# Patient Record
Sex: Female | Born: 1935
Health system: Southern US, Community
[De-identification: ages and names within clinical notes are randomized; demographics above are authoritative.]

## PROBLEM LIST (undated history)

## (undated) DIAGNOSIS — I5030 Unspecified diastolic (congestive) heart failure: Secondary | ICD-10-CM

## (undated) DIAGNOSIS — K219 Gastro-esophageal reflux disease without esophagitis: Secondary | ICD-10-CM

## (undated) DIAGNOSIS — E119 Type 2 diabetes mellitus without complications: Secondary | ICD-10-CM

## (undated) DIAGNOSIS — I10 Essential (primary) hypertension: Secondary | ICD-10-CM

## (undated) DIAGNOSIS — F32A Depression, unspecified: Secondary | ICD-10-CM

## (undated) DIAGNOSIS — M1990 Unspecified osteoarthritis, unspecified site: Secondary | ICD-10-CM

## (undated) DIAGNOSIS — E785 Hyperlipidemia, unspecified: Secondary | ICD-10-CM

## (undated) DIAGNOSIS — I739 Peripheral vascular disease, unspecified: Secondary | ICD-10-CM

## (undated) DIAGNOSIS — E079 Disorder of thyroid, unspecified: Secondary | ICD-10-CM

## (undated) DIAGNOSIS — F329 Major depressive disorder, single episode, unspecified: Secondary | ICD-10-CM

## (undated) HISTORY — DX: Hyperlipidemia, unspecified: E78.5

## (undated) HISTORY — DX: Unspecified diastolic (congestive) heart failure: I50.30

## (undated) HISTORY — DX: Major depressive disorder, single episode, unspecified: F32.9

## (undated) HISTORY — DX: Unspecified osteoarthritis, unspecified site: M19.90

## (undated) HISTORY — DX: Essential (primary) hypertension: I10

## (undated) HISTORY — DX: Disorder of thyroid, unspecified: E07.9

## (undated) HISTORY — DX: Gastro-esophageal reflux disease without esophagitis: K21.9

## (undated) HISTORY — DX: Depression, unspecified: F32.A

## (undated) HISTORY — DX: Type 2 diabetes mellitus without complications: E11.9

## (undated) HISTORY — DX: Peripheral vascular disease, unspecified: I73.9

---

## 2012-07-15 DIAGNOSIS — M47817 Spondylosis without myelopathy or radiculopathy, lumbosacral region: Secondary | ICD-10-CM | POA: Insufficient documentation

## 2012-07-15 DIAGNOSIS — J309 Allergic rhinitis, unspecified: Secondary | ICD-10-CM | POA: Insufficient documentation

## 2012-07-15 DIAGNOSIS — M1710 Unilateral primary osteoarthritis, unspecified knee: Secondary | ICD-10-CM | POA: Insufficient documentation

## 2012-07-15 DIAGNOSIS — M5137 Other intervertebral disc degeneration, lumbosacral region: Secondary | ICD-10-CM | POA: Insufficient documentation

## 2012-07-18 DIAGNOSIS — F329 Major depressive disorder, single episode, unspecified: Secondary | ICD-10-CM | POA: Insufficient documentation

## 2012-07-18 DIAGNOSIS — F339 Major depressive disorder, recurrent, unspecified: Secondary | ICD-10-CM | POA: Insufficient documentation

## 2012-07-18 DIAGNOSIS — F411 Generalized anxiety disorder: Secondary | ICD-10-CM | POA: Insufficient documentation

## 2012-08-04 DIAGNOSIS — M3500 Sicca syndrome, unspecified: Secondary | ICD-10-CM | POA: Insufficient documentation

## 2013-02-23 DIAGNOSIS — I10 Essential (primary) hypertension: Secondary | ICD-10-CM | POA: Insufficient documentation

## 2013-02-23 DIAGNOSIS — E782 Mixed hyperlipidemia: Secondary | ICD-10-CM | POA: Insufficient documentation

## 2013-02-23 DIAGNOSIS — E1169 Type 2 diabetes mellitus with other specified complication: Secondary | ICD-10-CM | POA: Insufficient documentation

## 2013-02-23 DIAGNOSIS — E7800 Pure hypercholesterolemia, unspecified: Secondary | ICD-10-CM | POA: Insufficient documentation

## 2013-09-04 DIAGNOSIS — G4700 Insomnia, unspecified: Secondary | ICD-10-CM | POA: Insufficient documentation

## 2014-01-21 DIAGNOSIS — E0590 Thyrotoxicosis, unspecified without thyrotoxic crisis or storm: Secondary | ICD-10-CM | POA: Insufficient documentation

## 2014-07-09 DIAGNOSIS — M4124 Other idiopathic scoliosis, thoracic region: Secondary | ICD-10-CM | POA: Insufficient documentation

## 2015-02-11 DIAGNOSIS — Z1231 Encounter for screening mammogram for malignant neoplasm of breast: Secondary | ICD-10-CM | POA: Diagnosis not present

## 2015-02-11 LAB — HM MAMMOGRAPHY

## 2015-02-27 DIAGNOSIS — E559 Vitamin D deficiency, unspecified: Secondary | ICD-10-CM | POA: Diagnosis not present

## 2015-02-27 DIAGNOSIS — E119 Type 2 diabetes mellitus without complications: Secondary | ICD-10-CM | POA: Diagnosis not present

## 2015-02-27 DIAGNOSIS — I10 Essential (primary) hypertension: Secondary | ICD-10-CM | POA: Diagnosis not present

## 2015-02-27 DIAGNOSIS — R809 Proteinuria, unspecified: Secondary | ICD-10-CM | POA: Diagnosis not present

## 2015-02-27 DIAGNOSIS — E876 Hypokalemia: Secondary | ICD-10-CM | POA: Diagnosis not present

## 2015-03-06 DIAGNOSIS — D696 Thrombocytopenia, unspecified: Secondary | ICD-10-CM | POA: Diagnosis not present

## 2015-03-06 DIAGNOSIS — E119 Type 2 diabetes mellitus without complications: Secondary | ICD-10-CM | POA: Diagnosis not present

## 2015-03-06 DIAGNOSIS — R809 Proteinuria, unspecified: Secondary | ICD-10-CM | POA: Diagnosis not present

## 2015-03-06 DIAGNOSIS — Z23 Encounter for immunization: Secondary | ICD-10-CM | POA: Diagnosis not present

## 2015-03-06 DIAGNOSIS — E7800 Pure hypercholesterolemia, unspecified: Secondary | ICD-10-CM | POA: Diagnosis not present

## 2015-03-06 DIAGNOSIS — I10 Essential (primary) hypertension: Secondary | ICD-10-CM | POA: Diagnosis not present

## 2015-03-06 DIAGNOSIS — E1140 Type 2 diabetes mellitus with diabetic neuropathy, unspecified: Secondary | ICD-10-CM | POA: Diagnosis not present

## 2015-03-06 DIAGNOSIS — M2010 Hallux valgus (acquired), unspecified foot: Secondary | ICD-10-CM | POA: Diagnosis not present

## 2015-03-06 DIAGNOSIS — L84 Corns and callosities: Secondary | ICD-10-CM | POA: Diagnosis not present

## 2015-03-11 DIAGNOSIS — H2513 Age-related nuclear cataract, bilateral: Secondary | ICD-10-CM | POA: Insufficient documentation

## 2015-06-23 DIAGNOSIS — I10 Essential (primary) hypertension: Secondary | ICD-10-CM | POA: Diagnosis not present

## 2015-06-23 DIAGNOSIS — D696 Thrombocytopenia, unspecified: Secondary | ICD-10-CM | POA: Diagnosis not present

## 2015-06-23 DIAGNOSIS — E119 Type 2 diabetes mellitus without complications: Secondary | ICD-10-CM | POA: Diagnosis not present

## 2015-06-23 DIAGNOSIS — E0590 Thyrotoxicosis, unspecified without thyrotoxic crisis or storm: Secondary | ICD-10-CM | POA: Diagnosis not present

## 2015-06-23 DIAGNOSIS — R0689 Other abnormalities of breathing: Secondary | ICD-10-CM | POA: Diagnosis not present

## 2015-06-23 DIAGNOSIS — Z79899 Other long term (current) drug therapy: Secondary | ICD-10-CM | POA: Diagnosis not present

## 2015-06-23 DIAGNOSIS — E876 Hypokalemia: Secondary | ICD-10-CM | POA: Diagnosis not present

## 2015-06-23 DIAGNOSIS — E559 Vitamin D deficiency, unspecified: Secondary | ICD-10-CM | POA: Diagnosis not present

## 2015-07-03 DIAGNOSIS — E7800 Pure hypercholesterolemia, unspecified: Secondary | ICD-10-CM | POA: Diagnosis not present

## 2015-07-03 DIAGNOSIS — R809 Proteinuria, unspecified: Secondary | ICD-10-CM | POA: Diagnosis not present

## 2015-07-17 DIAGNOSIS — M7072 Other bursitis of hip, left hip: Secondary | ICD-10-CM | POA: Diagnosis not present

## 2015-07-17 DIAGNOSIS — M25422 Effusion, left elbow: Secondary | ICD-10-CM | POA: Diagnosis not present

## 2015-07-17 DIAGNOSIS — S0003XA Contusion of scalp, initial encounter: Secondary | ICD-10-CM | POA: Diagnosis not present

## 2015-07-17 DIAGNOSIS — M7071 Other bursitis of hip, right hip: Secondary | ICD-10-CM | POA: Insufficient documentation

## 2015-07-17 DIAGNOSIS — R809 Proteinuria, unspecified: Secondary | ICD-10-CM | POA: Diagnosis not present

## 2015-07-17 DIAGNOSIS — Z9181 History of falling: Secondary | ICD-10-CM | POA: Diagnosis not present

## 2015-07-17 DIAGNOSIS — Z780 Asymptomatic menopausal state: Secondary | ICD-10-CM | POA: Diagnosis not present

## 2015-07-17 DIAGNOSIS — E7800 Pure hypercholesterolemia, unspecified: Secondary | ICD-10-CM | POA: Diagnosis not present

## 2015-07-17 DIAGNOSIS — S0090XA Unspecified superficial injury of unspecified part of head, initial encounter: Secondary | ICD-10-CM | POA: Diagnosis not present

## 2015-07-17 DIAGNOSIS — I10 Essential (primary) hypertension: Secondary | ICD-10-CM | POA: Diagnosis not present

## 2015-07-17 DIAGNOSIS — M25522 Pain in left elbow: Secondary | ICD-10-CM | POA: Diagnosis not present

## 2015-07-17 DIAGNOSIS — E1140 Type 2 diabetes mellitus with diabetic neuropathy, unspecified: Secondary | ICD-10-CM | POA: Diagnosis not present

## 2015-08-18 DIAGNOSIS — E119 Type 2 diabetes mellitus without complications: Secondary | ICD-10-CM | POA: Diagnosis not present

## 2015-08-18 DIAGNOSIS — H35371 Puckering of macula, right eye: Secondary | ICD-10-CM | POA: Diagnosis not present

## 2015-08-19 DIAGNOSIS — M8588 Other specified disorders of bone density and structure, other site: Secondary | ICD-10-CM | POA: Diagnosis not present

## 2015-08-19 DIAGNOSIS — Z780 Asymptomatic menopausal state: Secondary | ICD-10-CM | POA: Diagnosis not present

## 2015-08-21 DIAGNOSIS — M7071 Other bursitis of hip, right hip: Secondary | ICD-10-CM | POA: Diagnosis not present

## 2015-08-21 DIAGNOSIS — M7072 Other bursitis of hip, left hip: Secondary | ICD-10-CM | POA: Diagnosis not present

## 2015-08-23 DIAGNOSIS — M8580 Other specified disorders of bone density and structure, unspecified site: Secondary | ICD-10-CM | POA: Insufficient documentation

## 2015-08-23 DIAGNOSIS — M810 Age-related osteoporosis without current pathological fracture: Secondary | ICD-10-CM | POA: Insufficient documentation

## 2015-11-21 DIAGNOSIS — I10 Essential (primary) hypertension: Secondary | ICD-10-CM | POA: Diagnosis not present

## 2015-11-21 DIAGNOSIS — E7800 Pure hypercholesterolemia, unspecified: Secondary | ICD-10-CM | POA: Diagnosis not present

## 2015-11-21 DIAGNOSIS — Z23 Encounter for immunization: Secondary | ICD-10-CM | POA: Diagnosis not present

## 2015-11-21 DIAGNOSIS — E119 Type 2 diabetes mellitus without complications: Secondary | ICD-10-CM | POA: Diagnosis not present

## 2015-11-21 DIAGNOSIS — E876 Hypokalemia: Secondary | ICD-10-CM | POA: Diagnosis not present

## 2015-11-21 DIAGNOSIS — R809 Proteinuria, unspecified: Secondary | ICD-10-CM | POA: Diagnosis not present

## 2015-11-21 DIAGNOSIS — E0590 Thyrotoxicosis, unspecified without thyrotoxic crisis or storm: Secondary | ICD-10-CM | POA: Diagnosis not present

## 2015-11-21 DIAGNOSIS — E1140 Type 2 diabetes mellitus with diabetic neuropathy, unspecified: Secondary | ICD-10-CM | POA: Diagnosis not present

## 2015-11-21 DIAGNOSIS — E559 Vitamin D deficiency, unspecified: Secondary | ICD-10-CM | POA: Diagnosis not present

## 2015-12-01 DIAGNOSIS — E559 Vitamin D deficiency, unspecified: Secondary | ICD-10-CM | POA: Diagnosis not present

## 2015-12-01 DIAGNOSIS — I10 Essential (primary) hypertension: Secondary | ICD-10-CM | POA: Diagnosis not present

## 2015-12-01 DIAGNOSIS — E0590 Thyrotoxicosis, unspecified without thyrotoxic crisis or storm: Secondary | ICD-10-CM | POA: Diagnosis not present

## 2016-01-27 DIAGNOSIS — E7800 Pure hypercholesterolemia, unspecified: Secondary | ICD-10-CM | POA: Diagnosis not present

## 2016-01-27 DIAGNOSIS — M25552 Pain in left hip: Secondary | ICD-10-CM | POA: Diagnosis not present

## 2016-01-27 DIAGNOSIS — E1129 Type 2 diabetes mellitus with other diabetic kidney complication: Secondary | ICD-10-CM | POA: Diagnosis not present

## 2016-01-27 DIAGNOSIS — R809 Proteinuria, unspecified: Secondary | ICD-10-CM | POA: Diagnosis not present

## 2016-01-27 DIAGNOSIS — M25551 Pain in right hip: Secondary | ICD-10-CM | POA: Diagnosis not present

## 2016-01-27 DIAGNOSIS — Z1231 Encounter for screening mammogram for malignant neoplasm of breast: Secondary | ICD-10-CM | POA: Diagnosis not present

## 2016-01-27 DIAGNOSIS — I10 Essential (primary) hypertension: Secondary | ICD-10-CM | POA: Diagnosis not present

## 2016-01-27 DIAGNOSIS — E1140 Type 2 diabetes mellitus with diabetic neuropathy, unspecified: Secondary | ICD-10-CM | POA: Diagnosis not present

## 2016-02-09 DIAGNOSIS — I739 Peripheral vascular disease, unspecified: Secondary | ICD-10-CM

## 2016-02-09 HISTORY — DX: Peripheral vascular disease, unspecified: I73.9

## 2016-03-02 DIAGNOSIS — E0590 Thyrotoxicosis, unspecified without thyrotoxic crisis or storm: Secondary | ICD-10-CM | POA: Diagnosis not present

## 2016-03-02 DIAGNOSIS — Z79899 Other long term (current) drug therapy: Secondary | ICD-10-CM | POA: Diagnosis not present

## 2016-03-05 DIAGNOSIS — Z79899 Other long term (current) drug therapy: Secondary | ICD-10-CM | POA: Diagnosis not present

## 2016-03-05 DIAGNOSIS — F4320 Adjustment disorder, unspecified: Secondary | ICD-10-CM | POA: Diagnosis not present

## 2016-03-05 DIAGNOSIS — I10 Essential (primary) hypertension: Secondary | ICD-10-CM | POA: Diagnosis not present

## 2016-03-05 DIAGNOSIS — Z634 Disappearance and death of family member: Secondary | ICD-10-CM | POA: Insufficient documentation

## 2016-03-05 DIAGNOSIS — E559 Vitamin D deficiency, unspecified: Secondary | ICD-10-CM | POA: Diagnosis not present

## 2016-03-05 DIAGNOSIS — E0590 Thyrotoxicosis, unspecified without thyrotoxic crisis or storm: Secondary | ICD-10-CM | POA: Diagnosis not present

## 2016-03-15 DIAGNOSIS — Z1231 Encounter for screening mammogram for malignant neoplasm of breast: Secondary | ICD-10-CM | POA: Diagnosis not present

## 2016-06-03 DIAGNOSIS — I10 Essential (primary) hypertension: Secondary | ICD-10-CM | POA: Diagnosis not present

## 2016-06-03 DIAGNOSIS — E1140 Type 2 diabetes mellitus with diabetic neuropathy, unspecified: Secondary | ICD-10-CM | POA: Diagnosis not present

## 2016-06-03 DIAGNOSIS — E7800 Pure hypercholesterolemia, unspecified: Secondary | ICD-10-CM | POA: Diagnosis not present

## 2016-06-03 DIAGNOSIS — E876 Hypokalemia: Secondary | ICD-10-CM | POA: Diagnosis not present

## 2016-06-03 DIAGNOSIS — E559 Vitamin D deficiency, unspecified: Secondary | ICD-10-CM | POA: Diagnosis not present

## 2016-06-03 DIAGNOSIS — R809 Proteinuria, unspecified: Secondary | ICD-10-CM | POA: Diagnosis not present

## 2016-06-03 DIAGNOSIS — E1129 Type 2 diabetes mellitus with other diabetic kidney complication: Secondary | ICD-10-CM | POA: Diagnosis not present

## 2016-06-18 DIAGNOSIS — E559 Vitamin D deficiency, unspecified: Secondary | ICD-10-CM | POA: Diagnosis not present

## 2016-06-18 DIAGNOSIS — M25552 Pain in left hip: Secondary | ICD-10-CM | POA: Diagnosis not present

## 2016-06-18 DIAGNOSIS — D72819 Decreased white blood cell count, unspecified: Secondary | ICD-10-CM | POA: Diagnosis not present

## 2016-06-18 DIAGNOSIS — E7800 Pure hypercholesterolemia, unspecified: Secondary | ICD-10-CM | POA: Diagnosis not present

## 2016-06-18 DIAGNOSIS — M25551 Pain in right hip: Secondary | ICD-10-CM | POA: Diagnosis not present

## 2016-06-18 DIAGNOSIS — R809 Proteinuria, unspecified: Secondary | ICD-10-CM | POA: Diagnosis not present

## 2016-06-18 DIAGNOSIS — E119 Type 2 diabetes mellitus without complications: Secondary | ICD-10-CM | POA: Diagnosis not present

## 2016-06-18 DIAGNOSIS — E669 Obesity, unspecified: Secondary | ICD-10-CM | POA: Diagnosis not present

## 2016-06-18 DIAGNOSIS — I10 Essential (primary) hypertension: Secondary | ICD-10-CM | POA: Diagnosis not present

## 2016-06-18 DIAGNOSIS — Z23 Encounter for immunization: Secondary | ICD-10-CM | POA: Diagnosis not present

## 2016-06-24 DIAGNOSIS — E0590 Thyrotoxicosis, unspecified without thyrotoxic crisis or storm: Secondary | ICD-10-CM | POA: Diagnosis not present

## 2016-06-24 DIAGNOSIS — Z79899 Other long term (current) drug therapy: Secondary | ICD-10-CM | POA: Diagnosis not present

## 2016-06-28 DIAGNOSIS — E0590 Thyrotoxicosis, unspecified without thyrotoxic crisis or storm: Secondary | ICD-10-CM | POA: Diagnosis not present

## 2016-06-28 DIAGNOSIS — I10 Essential (primary) hypertension: Secondary | ICD-10-CM | POA: Diagnosis not present

## 2016-06-28 DIAGNOSIS — F4320 Adjustment disorder, unspecified: Secondary | ICD-10-CM | POA: Diagnosis not present

## 2016-06-28 DIAGNOSIS — Z634 Disappearance and death of family member: Secondary | ICD-10-CM | POA: Diagnosis not present

## 2016-06-28 DIAGNOSIS — E559 Vitamin D deficiency, unspecified: Secondary | ICD-10-CM | POA: Diagnosis not present

## 2016-09-14 DIAGNOSIS — E0590 Thyrotoxicosis, unspecified without thyrotoxic crisis or storm: Secondary | ICD-10-CM | POA: Diagnosis not present

## 2016-09-14 DIAGNOSIS — E559 Vitamin D deficiency, unspecified: Secondary | ICD-10-CM | POA: Diagnosis not present

## 2016-09-14 DIAGNOSIS — I10 Essential (primary) hypertension: Secondary | ICD-10-CM | POA: Diagnosis not present

## 2016-10-15 DIAGNOSIS — I10 Essential (primary) hypertension: Secondary | ICD-10-CM | POA: Diagnosis not present

## 2016-10-19 DIAGNOSIS — H25013 Cortical age-related cataract, bilateral: Secondary | ICD-10-CM | POA: Diagnosis not present

## 2016-10-19 DIAGNOSIS — E119 Type 2 diabetes mellitus without complications: Secondary | ICD-10-CM | POA: Diagnosis not present

## 2016-10-19 DIAGNOSIS — H25042 Posterior subcapsular polar age-related cataract, left eye: Secondary | ICD-10-CM | POA: Diagnosis not present

## 2016-10-19 DIAGNOSIS — H35371 Puckering of macula, right eye: Secondary | ICD-10-CM | POA: Diagnosis not present

## 2016-10-19 DIAGNOSIS — H43813 Vitreous degeneration, bilateral: Secondary | ICD-10-CM | POA: Diagnosis not present

## 2016-10-19 DIAGNOSIS — H524 Presbyopia: Secondary | ICD-10-CM | POA: Diagnosis not present

## 2016-10-19 DIAGNOSIS — H52203 Unspecified astigmatism, bilateral: Secondary | ICD-10-CM | POA: Diagnosis not present

## 2016-10-19 DIAGNOSIS — H2513 Age-related nuclear cataract, bilateral: Secondary | ICD-10-CM | POA: Diagnosis not present

## 2016-10-19 DIAGNOSIS — H5213 Myopia, bilateral: Secondary | ICD-10-CM | POA: Diagnosis not present

## 2016-10-19 DIAGNOSIS — H43393 Other vitreous opacities, bilateral: Secondary | ICD-10-CM | POA: Diagnosis not present

## 2016-10-19 DIAGNOSIS — H04123 Dry eye syndrome of bilateral lacrimal glands: Secondary | ICD-10-CM | POA: Diagnosis not present

## 2016-11-28 DIAGNOSIS — I739 Peripheral vascular disease, unspecified: Secondary | ICD-10-CM | POA: Diagnosis not present

## 2016-11-28 DIAGNOSIS — I519 Heart disease, unspecified: Secondary | ICD-10-CM | POA: Diagnosis not present

## 2016-11-28 DIAGNOSIS — I517 Cardiomegaly: Secondary | ICD-10-CM | POA: Diagnosis not present

## 2016-11-28 DIAGNOSIS — I10 Essential (primary) hypertension: Secondary | ICD-10-CM | POA: Diagnosis not present

## 2016-11-28 DIAGNOSIS — M5136 Other intervertebral disc degeneration, lumbar region: Secondary | ICD-10-CM | POA: Diagnosis not present

## 2016-11-28 DIAGNOSIS — I083 Combined rheumatic disorders of mitral, aortic and tricuspid valves: Secondary | ICD-10-CM | POA: Diagnosis not present

## 2016-11-28 DIAGNOSIS — E785 Hyperlipidemia, unspecified: Secondary | ICD-10-CM | POA: Diagnosis not present

## 2016-11-28 DIAGNOSIS — I743 Embolism and thrombosis of arteries of the lower extremities: Secondary | ICD-10-CM | POA: Diagnosis present

## 2016-11-28 DIAGNOSIS — F446 Conversion disorder with sensory symptom or deficit: Secondary | ICD-10-CM | POA: Diagnosis not present

## 2016-11-28 DIAGNOSIS — R202 Paresthesia of skin: Secondary | ICD-10-CM | POA: Diagnosis not present

## 2016-11-28 DIAGNOSIS — Z881 Allergy status to other antibiotic agents status: Secondary | ICD-10-CM | POA: Diagnosis not present

## 2016-11-28 DIAGNOSIS — R531 Weakness: Secondary | ICD-10-CM | POA: Diagnosis not present

## 2016-11-28 DIAGNOSIS — D259 Leiomyoma of uterus, unspecified: Secondary | ICD-10-CM | POA: Diagnosis not present

## 2016-11-28 DIAGNOSIS — Z79899 Other long term (current) drug therapy: Secondary | ICD-10-CM | POA: Diagnosis not present

## 2016-11-28 DIAGNOSIS — Z7982 Long term (current) use of aspirin: Secondary | ICD-10-CM | POA: Diagnosis not present

## 2016-11-28 DIAGNOSIS — I998 Other disorder of circulatory system: Secondary | ICD-10-CM | POA: Diagnosis present

## 2016-11-28 DIAGNOSIS — M79604 Pain in right leg: Secondary | ICD-10-CM | POA: Diagnosis not present

## 2016-11-28 DIAGNOSIS — R200 Anesthesia of skin: Secondary | ICD-10-CM | POA: Diagnosis not present

## 2016-11-28 DIAGNOSIS — K219 Gastro-esophageal reflux disease without esophagitis: Secondary | ICD-10-CM | POA: Diagnosis present

## 2016-11-28 DIAGNOSIS — I70201 Unspecified atherosclerosis of native arteries of extremities, right leg: Secondary | ICD-10-CM | POA: Diagnosis not present

## 2016-11-29 HISTORY — PX: FEMORAL ENDARTERECTOMY: SUR606

## 2016-12-04 DIAGNOSIS — I70221 Atherosclerosis of native arteries of extremities with rest pain, right leg: Secondary | ICD-10-CM | POA: Diagnosis not present

## 2016-12-04 DIAGNOSIS — I10 Essential (primary) hypertension: Secondary | ICD-10-CM | POA: Diagnosis not present

## 2016-12-04 DIAGNOSIS — K219 Gastro-esophageal reflux disease without esophagitis: Secondary | ICD-10-CM | POA: Diagnosis not present

## 2016-12-04 DIAGNOSIS — E785 Hyperlipidemia, unspecified: Secondary | ICD-10-CM | POA: Diagnosis not present

## 2016-12-04 DIAGNOSIS — R2689 Other abnormalities of gait and mobility: Secondary | ICD-10-CM | POA: Diagnosis not present

## 2016-12-04 DIAGNOSIS — I743 Embolism and thrombosis of arteries of the lower extremities: Secondary | ICD-10-CM | POA: Diagnosis not present

## 2016-12-10 DIAGNOSIS — I743 Embolism and thrombosis of arteries of the lower extremities: Secondary | ICD-10-CM | POA: Diagnosis not present

## 2016-12-10 DIAGNOSIS — K219 Gastro-esophageal reflux disease without esophagitis: Secondary | ICD-10-CM | POA: Diagnosis not present

## 2016-12-10 DIAGNOSIS — I70221 Atherosclerosis of native arteries of extremities with rest pain, right leg: Secondary | ICD-10-CM | POA: Diagnosis not present

## 2016-12-10 DIAGNOSIS — I10 Essential (primary) hypertension: Secondary | ICD-10-CM | POA: Diagnosis not present

## 2016-12-10 DIAGNOSIS — R2689 Other abnormalities of gait and mobility: Secondary | ICD-10-CM | POA: Diagnosis not present

## 2016-12-10 DIAGNOSIS — E785 Hyperlipidemia, unspecified: Secondary | ICD-10-CM | POA: Diagnosis not present

## 2016-12-15 DIAGNOSIS — I743 Embolism and thrombosis of arteries of the lower extremities: Secondary | ICD-10-CM | POA: Diagnosis not present

## 2016-12-15 DIAGNOSIS — I70221 Atherosclerosis of native arteries of extremities with rest pain, right leg: Secondary | ICD-10-CM | POA: Diagnosis not present

## 2016-12-15 DIAGNOSIS — E785 Hyperlipidemia, unspecified: Secondary | ICD-10-CM | POA: Diagnosis not present

## 2016-12-15 DIAGNOSIS — R2689 Other abnormalities of gait and mobility: Secondary | ICD-10-CM | POA: Diagnosis not present

## 2016-12-15 DIAGNOSIS — I10 Essential (primary) hypertension: Secondary | ICD-10-CM | POA: Diagnosis not present

## 2016-12-15 DIAGNOSIS — K219 Gastro-esophageal reflux disease without esophagitis: Secondary | ICD-10-CM | POA: Diagnosis not present

## 2016-12-24 DIAGNOSIS — I70221 Atherosclerosis of native arteries of extremities with rest pain, right leg: Secondary | ICD-10-CM | POA: Diagnosis not present

## 2016-12-24 DIAGNOSIS — E785 Hyperlipidemia, unspecified: Secondary | ICD-10-CM | POA: Diagnosis not present

## 2016-12-24 DIAGNOSIS — I10 Essential (primary) hypertension: Secondary | ICD-10-CM | POA: Diagnosis not present

## 2016-12-24 DIAGNOSIS — K219 Gastro-esophageal reflux disease without esophagitis: Secondary | ICD-10-CM | POA: Diagnosis not present

## 2016-12-24 DIAGNOSIS — I743 Embolism and thrombosis of arteries of the lower extremities: Secondary | ICD-10-CM | POA: Diagnosis not present

## 2016-12-24 DIAGNOSIS — R2689 Other abnormalities of gait and mobility: Secondary | ICD-10-CM | POA: Diagnosis not present

## 2016-12-29 DIAGNOSIS — R739 Hyperglycemia, unspecified: Secondary | ICD-10-CM | POA: Diagnosis not present

## 2016-12-29 DIAGNOSIS — E119 Type 2 diabetes mellitus without complications: Secondary | ICD-10-CM | POA: Diagnosis not present

## 2016-12-29 DIAGNOSIS — F329 Major depressive disorder, single episode, unspecified: Secondary | ICD-10-CM | POA: Diagnosis not present

## 2016-12-29 DIAGNOSIS — E0590 Thyrotoxicosis, unspecified without thyrotoxic crisis or storm: Secondary | ICD-10-CM | POA: Diagnosis not present

## 2016-12-29 DIAGNOSIS — I743 Embolism and thrombosis of arteries of the lower extremities: Secondary | ICD-10-CM | POA: Diagnosis not present

## 2016-12-29 DIAGNOSIS — E559 Vitamin D deficiency, unspecified: Secondary | ICD-10-CM | POA: Diagnosis not present

## 2016-12-29 DIAGNOSIS — I10 Essential (primary) hypertension: Secondary | ICD-10-CM | POA: Diagnosis not present

## 2017-01-04 DIAGNOSIS — I743 Embolism and thrombosis of arteries of the lower extremities: Secondary | ICD-10-CM | POA: Diagnosis not present

## 2017-01-04 DIAGNOSIS — I70221 Atherosclerosis of native arteries of extremities with rest pain, right leg: Secondary | ICD-10-CM | POA: Diagnosis not present

## 2017-01-04 DIAGNOSIS — E785 Hyperlipidemia, unspecified: Secondary | ICD-10-CM | POA: Diagnosis not present

## 2017-01-04 DIAGNOSIS — K219 Gastro-esophageal reflux disease without esophagitis: Secondary | ICD-10-CM | POA: Diagnosis not present

## 2017-01-04 DIAGNOSIS — I10 Essential (primary) hypertension: Secondary | ICD-10-CM | POA: Diagnosis not present

## 2017-01-04 DIAGNOSIS — R2689 Other abnormalities of gait and mobility: Secondary | ICD-10-CM | POA: Diagnosis not present

## 2017-01-05 DIAGNOSIS — Z48812 Encounter for surgical aftercare following surgery on the circulatory system: Secondary | ICD-10-CM | POA: Diagnosis not present

## 2017-01-05 DIAGNOSIS — I739 Peripheral vascular disease, unspecified: Secondary | ICD-10-CM | POA: Diagnosis not present

## 2017-01-20 ENCOUNTER — Encounter: Payer: Self-pay | Admitting: *Deleted

## 2017-01-20 ENCOUNTER — Other Ambulatory Visit: Payer: Self-pay | Admitting: *Deleted

## 2017-01-24 ENCOUNTER — Ambulatory Visit (INDEPENDENT_AMBULATORY_CARE_PROVIDER_SITE_OTHER): Payer: Medicare Other | Admitting: Family Medicine

## 2017-01-24 ENCOUNTER — Encounter: Payer: Self-pay | Admitting: Family Medicine

## 2017-01-24 VITALS — BP 140/66 | HR 52 | Temp 98.2°F | Ht 61.75 in | Wt 165.0 lb

## 2017-01-24 DIAGNOSIS — F341 Dysthymic disorder: Secondary | ICD-10-CM | POA: Diagnosis not present

## 2017-01-24 DIAGNOSIS — E119 Type 2 diabetes mellitus without complications: Secondary | ICD-10-CM | POA: Diagnosis not present

## 2017-01-24 DIAGNOSIS — R600 Localized edema: Secondary | ICD-10-CM | POA: Diagnosis not present

## 2017-01-24 DIAGNOSIS — I10 Essential (primary) hypertension: Secondary | ICD-10-CM | POA: Diagnosis not present

## 2017-01-24 DIAGNOSIS — D649 Anemia, unspecified: Secondary | ICD-10-CM

## 2017-01-24 DIAGNOSIS — M19011 Primary osteoarthritis, right shoulder: Secondary | ICD-10-CM | POA: Diagnosis not present

## 2017-01-24 DIAGNOSIS — I743 Embolism and thrombosis of arteries of the lower extremities: Secondary | ICD-10-CM

## 2017-01-24 DIAGNOSIS — E0590 Thyrotoxicosis, unspecified without thyrotoxic crisis or storm: Secondary | ICD-10-CM | POA: Diagnosis not present

## 2017-01-24 DIAGNOSIS — E1142 Type 2 diabetes mellitus with diabetic polyneuropathy: Secondary | ICD-10-CM | POA: Insufficient documentation

## 2017-01-24 DIAGNOSIS — Z7901 Long term (current) use of anticoagulants: Secondary | ICD-10-CM | POA: Diagnosis not present

## 2017-01-24 DIAGNOSIS — F329 Major depressive disorder, single episode, unspecified: Secondary | ICD-10-CM

## 2017-01-24 DIAGNOSIS — M51379 Other intervertebral disc degeneration, lumbosacral region without mention of lumbar back pain or lower extremity pain: Secondary | ICD-10-CM

## 2017-01-24 DIAGNOSIS — M5137 Other intervertebral disc degeneration, lumbosacral region: Secondary | ICD-10-CM | POA: Diagnosis not present

## 2017-01-24 NOTE — Progress Notes (Signed)
Subjective  CC:  Chief Complaint  Patient presents with  . Establish Care    HPI: Nicole Jimenez is a 82 y.o. female who presents to [place] at [facility] today to establish care with me as a new patient.  She needs a new primary care doctor because her former doctor, Dr. Dot Been, left her practice. She has the following concerns or needs:   Very pleasant [place] 82 year old female who lives independently with past medical history significant for recent right femoral artery embolectomy due to thromboembolism, no clear source identified after 2D echocardiogram done and abdominal pelvic CT scan.  I reviewed all of those records.  She recently followed up with vascular surgeon and is doing well.  He has her on aspirin and Eliquis.  Will reevaluate in 6 months to see if she can stop this.  She does complain of bilateral lower extremity swelling that started prior to this.  He ordered compression stockings although she is not wearing them.  She reports that she has had swelling in the past, was on hydrochlorothiazide but her blood pressure got low and so that was stopped.  She has no calf pain.  No shortness of breath.  Energy level is good.  She has diet-controlled diabetes and subclinical hyperthyroidism managed by endocrinology.  I reviewed those recent notes.  Hypertension has been well controlled he has has hypercholesterolemia  She does suffer from osteoarthritic pain, mainly in her back and right shoulder.  This does limit her activity somewhat.  However she stays active.  Her most recent bone density was in 2017 showed mild osteopenia.  We updated and reviewed the patient's past history in detail and it is documented below.  Patient Active Problem List   Diagnosis Date Noted  . Diet-controlled diabetes mellitus ([place]) 01/24/2017    Priority: High  . Essential hypertension 02/23/2013    Priority: High  . Hypercholesterolemia 02/23/2013    Priority: High    . DJD of right shoulder 01/24/2017    Priority: Medium  . Bereavement due to life event 03/05/2016    Priority: Medium  . Osteopenia 08/23/2015    Priority: Medium  . Idiopathic scoliosis of thoracic spine 07/09/2014    Priority: Medium  . Subclinical hyperthyroidism 01/21/2014    Priority: Medium  . DDD (degenerative disc disease), lumbosacral 07/15/2012    Priority: Medium  . Lumbosacral spondylosis 07/15/2012    Priority: Medium  . Osteoarthritis of knee 07/15/2012    Priority: Medium  . Bilateral hip bursitis 07/17/2015    Priority: Low  . Age-related nuclear cataract of both eyes 03/11/2015    Priority: Low  . Insomnia 09/04/2013    Priority: Low  . Embolus of femoral artery ([place]) 01/26/2017  . Bilateral edema of lower extremity 01/26/2017  . Sjogren's syndrome ([place]) 08/04/2012    Health Maintenance  Topic Date Due  . TETANUS/TDAP  01/24/2018 (Originally 12/14/1953)  . URINE MICROALBUMIN  05/26/2017  . FOOT EXAM  06/18/2017  . HEMOGLOBIN A1C  07/25/2017  . OPHTHALMOLOGY EXAM  10/19/2017  . INFLUENZA VACCINE  Completed  . DEXA SCAN  Completed  . PNA vac Low Risk Adult  Completed   Immunization History  Administered Date(s) Administered  . Influenza Split 01/24/2012  . Influenza, High Dose Seasonal PF 12/02/2013, 01/20/2015, 12/27/2016  . Influenza,inj,Quad PF,6+ Mos 11/21/2015  . Influenza-Unspecified 01/24/2012, 01/05/2014, 01/05/2014, 01/20/2015  . Pneumococcal Conjugate-13 03/06/2015, 03/06/2015  . Pneumococcal Polysaccharide-23 06/18/2016  . Zoster 10/09/2012, 10/09/2012   Current  Meds  Medication Sig  . Acetaminophen 500 MG coapsule Take 1,000 mg by mouth as needed.   . ACIDOPHILUS LACTOBACILLUS PO Take 1 each by mouth daily.   Marland Kitchen aspirin 81 MG tablet Take 81 mg by mouth.  Marland Kitchen atenolol (TENORMIN) 50 MG tablet TAKE 1 TABLET BY MOUTH TWICE A DAY FOR BLOOD PRESSURE  . Calcium Carb-Cholecalciferol 600-800 MG-UNIT CHEW Chew 1 each by mouth daily.   .  citalopram (CELEXA) 20 MG tablet TAKE 1 TABLET BY MOUTH EVERY DAY  . diltiazem (CARDIZEM CD) 180 MG 24 hr capsule Take 180 mg by mouth every morning.  . Glucosamine-MSM-Hyaluronic Acd (JOINT HEALTH PO) Take 1 tablet by mouth daily.  Marland Kitchen glucose blood (ONE TOUCH ULTRA TEST) test strip USE TWICE DAILY TO CHECK BLOOD SUGAR - E11.40  . omeprazole (PRILOSEC) 20 MG capsule TAKE ONE CAPSULE BY MOUTH EVERY MORNING 30 MINUTES BEFORE BREAKFAST.  Marland Kitchen pravastatin (PRAVACHOL) 80 MG tablet Take 80 mg by mouth daily.  . [DISCONTINUED] acetaminophen (TYLENOL) 500 MG tablet Frequency:   Dosage:0   MG  Instructions:  Note:2 tabs po PRN  . [DISCONTINUED] DOCOSAHEXAENOIC ACID PO Frequency:   Dosage:0   MG  Instructions:  Note:353 mg 1 tab by mouth daily    Allergies: Patient is allergic to amoxicillin-pot clavulanate; amoxicillin; and clarithromycin. Past Medical History Patient  has a past medical history of Arthritis, Depression, Diabetes mellitus without complication ([place]), GERD (gastroesophageal reflux disease), Hyperlipidemia, Hypertension, Peripheral arterial disease ([place]) (2018), and Thyroid disease. Past Surgical History Patient  has a past surgical history that includes Femoral endarterectomy (Right, 11/29/2016). Family History: Patient family history includes Arthritis in her mother and sister; Diabetes in her father and sister; Healthy in her daughter, son, and son; Heart attack in her father and mother; Hyperlipidemia in her father. Social History:  Patient  reports that  has never smoked. she has never used smokeless tobacco. She reports that she does not drink alcohol or use drugs.  Review of Systems: Constitutional: negative for fever or malaise Ophthalmic: negative for photophobia, double vision or loss of vision Cardiovascular: negative for chest pain, dyspnea on exertion, or new LE swelling Respiratory: negative for SOB or persistent cough Gastrointestinal: negative for abdominal pain, change in  bowel habits or melena Genitourinary: negative for dysuria or gross hematuria Musculoskeletal: negative for new gait disturbance or muscular weakness Integumentary: negative for new or persistent rashes Neurological: negative for TIA or stroke symptoms Psychiatric: negative for SI or delusions Allergic/Immunologic: negative for hives  Patient Care Team    Relationship Specialty Notifications Start End  Leamon Arnt, MD PCP - General Family Medicine  01/24/17   Roel Cluck, MD Referring Physician Ophthalmology  01/24/17   Alanson Aly, MD Consulting Physician Endocrinology  01/24/17   Dinah Beers, MD Referring Physician Vascular Surgery  01/24/17     Objective  Vitals: BP 140/66 (BP Location: Left Arm, Patient Position: Sitting, Cuff Size: Normal)   Pulse (!) 52   Temp 98.2 F (36.8 C) (Oral)   Ht 5' 1.75" (1.568 m)   Wt 165 lb (74.8 kg)   SpO2 96%   BMI 30.42 kg/m  General:  Well developed, well nourished, no acute distress  Psych:  Alert and oriented,normal mood and affect HEENT:  Normocephalic, atraumatic, non-icteric sclera, PERRL, oropharynx is without mass or exudate, supple neck without adenopathy, mass or thyromegaly Cardiovascular:  RRR without gallop, rub or murmur, nondisplaced PMI, there is 2+ pitting edema bilateral ankles, distal pulses are +  2 bilaterally Respiratory:  Good breath sounds bilaterally, CTAB with normal respiratory effort Gastrointestinal: normal bowel sounds, soft, non-tender, no noted masses. No HSM MSK: no deformities, contusions. Joints are without erythema or swelling no cords Skin:  Warm, no rashes or suspicious lesions noted Neurologic:    Mental status is normal. Gross motor and sensory exams are normal. Normal gait  Assessment  1. Essential hypertension   2. Diet-controlled diabetes mellitus ([place])   3. Primary osteoarthritis of right shoulder   4. Subclinical hyperthyroidism   5. DDD (degenerative disc disease), lumbosacral    6. Major depression, chronic   7. Anticoagulated   8. Bilateral edema of lower extremity   9. Postoperative anemia   10. Embolus of femoral artery ([place])      Plan   We discussed the above chronic medical conditions that are mostly well controlled.  I will reassess diabetic control and monitor her electrolytes and renal function.  We will recheck her lipids.  I will adjust medications as appropriate.  Her blood pressure is mildly elevated today.  Pending lab work, I will consider adding HCTZ, low-dose or adjusting her blood pressure medication so that we can treat her lower extremity edema that is likely just dependent in nature.  We will go over all the results and adjust her medications at her follow-up visit in 2-3 weeks.  Follow up:  No Follow-up on file.  Commons side effects, risks, benefits, and alternatives for medications and treatment plan prescribed today were discussed, and the patient expressed understanding of the given instructions. Patient is instructed to call or message via MyChart if he/she has any questions or concerns regarding our treatment plan. No barriers to understanding were identified. We discussed Red Flag symptoms and signs in detail. Patient expressed understanding regarding what to do in case of urgent or emergency type symptoms.   Medication list was reconciled, printed and provided to the patient in AVS. Patient instructions and summary information was reviewed with the patient as documented in the AVS. This note was prepared with assistance of Dragon voice recognition software. Occasional wrong-word or sound-a-like substitutions may have occurred due to the inherent limitations of voice recognition software  Orders Placed This Encounter  Procedures  . CBC with Differential/Platelet  . Comp Met (CMET)  . HgB A1c  . Lipid panel   No orders of the defined types were placed in this encounter.

## 2017-01-24 NOTE — Patient Instructions (Signed)
Please return in 2-3 weeks for follow up office visit to go over your results and medication recommendations.  It was a pleasure meeting you! Thank you for choosing [place] to meet your healthcare needs! I truly look forward to working with you.   Edema Edema is when you have too much fluid in your body or under your skin. Edema may make your legs, feet, and ankles swell up. Swelling is also common in looser tissues, like around your eyes. This is a common condition. It gets more common as you get older. There are many possible causes of edema. Eating too much salt (sodium) and being on your feet or sitting for a long time can cause edema in your legs, feet, and ankles. Hot weather may make edema worse. Edema is usually painless. Your skin may look swollen or shiny. Follow these instructions at home:  Keep the swollen body part raised (elevated) above the level of your heart when you are sitting or lying down.  Do not sit still or stand for a long time.  Do not wear tight clothes. Do not wear garters on your upper legs.  Exercise your legs. This can help the swelling go down.  Wear elastic bandages or support stockings as told by your doctor.  Eat a low-salt (low-sodium) diet to reduce fluid as told by your doctor.  Depending on the cause of your swelling, you may need to limit how much fluid you drink (fluid restriction).  Take over-the-counter and prescription medicines only as told by your doctor. Contact a doctor if:  Treatment is not working.  You have heart, liver, or kidney disease and have symptoms of edema.  You have sudden and unexplained weight gain. Get help right away if:  You have shortness of breath or chest pain.  You cannot breathe when you lie down.  You have pain, redness, or warmth in the swollen areas.  You have heart, liver, or kidney disease and get edema all of a sudden.  You have a fever and your symptoms get worse all of a sudden. Summary  Edema is  when you have too much fluid in your body or under your skin.  Edema may make your legs, feet, and ankles swell up. Swelling is also common in looser tissues, like around your eyes.  Raise (elevate) the swollen body part above the level of your heart when you are sitting or lying down.  Follow your doctor's instructions about diet and how much fluid you can drink (fluid restriction). This information is not intended to replace advice given to you by your health care provider. Make sure you discuss any questions you have with your health care provider. Document Released: 07/14/2007 Document Revised: 02/13/2016 Document Reviewed: 02/13/2016 Elsevier Interactive Patient Education  2017 [employer].

## 2017-01-25 LAB — COMPREHENSIVE METABOLIC PANEL
AG Ratio: 1.1 (calc) (ref 1.0–2.5)
ALBUMIN MSPROF: 4.1 g/dL (ref 3.6–5.1)
ALKALINE PHOSPHATASE (APISO): 78 U/L (ref 33–130)
ALT: 14 U/L (ref 6–29)
AST: 18 U/L (ref 10–35)
BUN/Creatinine Ratio: 23 (calc) — ABNORMAL HIGH (ref 6–22)
BUN: 22 mg/dL (ref 7–25)
CHLORIDE: 101 mmol/L (ref 98–110)
CO2: 32 mmol/L (ref 20–32)
CREATININE: 0.95 mg/dL — AB (ref 0.60–0.88)
Calcium: 9.5 mg/dL (ref 8.6–10.4)
GLOBULIN: 3.6 g/dL (ref 1.9–3.7)
Glucose, Bld: 144 mg/dL — ABNORMAL HIGH (ref 65–99)
POTASSIUM: 4.2 mmol/L (ref 3.5–5.3)
SODIUM: 139 mmol/L (ref 135–146)
Total Bilirubin: 0.5 mg/dL (ref 0.2–1.2)
Total Protein: 7.7 g/dL (ref 6.1–8.1)

## 2017-01-25 LAB — CBC WITH DIFFERENTIAL/PLATELET
BASOS ABS: 47 {cells}/uL (ref 0–200)
Basophils Relative: 0.8 %
EOS ABS: 142 {cells}/uL (ref 15–500)
Eosinophils Relative: 2.4 %
HEMATOCRIT: 39.8 % (ref 35.0–45.0)
Hemoglobin: 13.2 g/dL (ref 11.7–15.5)
LYMPHS ABS: 1239 {cells}/uL (ref 850–3900)
MCH: 29.5 pg (ref 27.0–33.0)
MCHC: 33.2 g/dL (ref 32.0–36.0)
MCV: 89 fL (ref 80.0–100.0)
MPV: 10.1 fL (ref 7.5–12.5)
Monocytes Relative: 10 %
Neutro Abs: 3882 cells/uL (ref 1500–7800)
Neutrophils Relative %: 65.8 %
Platelets: 228 10*3/uL (ref 140–400)
RBC: 4.47 10*6/uL (ref 3.80–5.10)
RDW: 12.9 % (ref 11.0–15.0)
Total Lymphocyte: 21 %
WBC: 5.9 10*3/uL (ref 3.8–10.8)
WBCMIX: 590 {cells}/uL (ref 200–950)

## 2017-01-25 LAB — LIPID PANEL
CHOLESTEROL: 167 mg/dL (ref <200)
HDL: 65 mg/dL (ref >50)
LDL Cholesterol (Calc): 72 mg/dL (calc)
Non-HDL Cholesterol (Calc): 102 mg/dL (calc) (ref <130)
TRIGLYCERIDES: 204 mg/dL — AB (ref <150)
Total CHOL/HDL Ratio: 2.6 (calc) (ref <5.0)

## 2017-01-25 LAB — HEMOGLOBIN A1C
HEMOGLOBIN A1C: 6 %{Hb} — AB (ref <5.7)
Mean Plasma Glucose: 126 (calc)
eAG (mmol/L): 7 (calc)

## 2017-01-26 DIAGNOSIS — R600 Localized edema: Secondary | ICD-10-CM | POA: Insufficient documentation

## 2017-01-26 DIAGNOSIS — I743 Embolism and thrombosis of arteries of the lower extremities: Secondary | ICD-10-CM | POA: Insufficient documentation

## 2017-01-26 NOTE — Progress Notes (Signed)
Reviewed labs; diabetic control is good; lipids at goal. Stable renal function. Will review at upcoming visit; add HCTZ for bp and edema control. Can decrease atenolol.

## 2017-02-14 ENCOUNTER — Encounter: Payer: Self-pay | Admitting: Family Medicine

## 2017-02-18 ENCOUNTER — Ambulatory Visit: Payer: Medicare Other | Admitting: Family Medicine

## 2017-03-03 ENCOUNTER — Ambulatory Visit (INDEPENDENT_AMBULATORY_CARE_PROVIDER_SITE_OTHER): Payer: Medicare Other | Admitting: Family Medicine

## 2017-03-03 ENCOUNTER — Other Ambulatory Visit: Payer: Self-pay | Admitting: Family Medicine

## 2017-03-03 ENCOUNTER — Encounter: Payer: Self-pay | Admitting: Family Medicine

## 2017-03-03 VITALS — BP 140/88 | HR 80 | Temp 98.1°F | Ht 61.75 in | Wt 167.6 lb

## 2017-03-03 DIAGNOSIS — I10 Essential (primary) hypertension: Secondary | ICD-10-CM

## 2017-03-03 DIAGNOSIS — R600 Localized edema: Secondary | ICD-10-CM

## 2017-03-03 DIAGNOSIS — F341 Dysthymic disorder: Secondary | ICD-10-CM

## 2017-03-03 DIAGNOSIS — E119 Type 2 diabetes mellitus without complications: Secondary | ICD-10-CM | POA: Diagnosis not present

## 2017-03-03 DIAGNOSIS — I5030 Unspecified diastolic (congestive) heart failure: Secondary | ICD-10-CM

## 2017-03-03 DIAGNOSIS — F329 Major depressive disorder, single episode, unspecified: Secondary | ICD-10-CM

## 2017-03-03 DIAGNOSIS — I5032 Chronic diastolic (congestive) heart failure: Secondary | ICD-10-CM | POA: Diagnosis not present

## 2017-03-03 HISTORY — DX: Unspecified diastolic (congestive) heart failure: I50.30

## 2017-03-03 MED ORDER — HYDROCHLOROTHIAZIDE 25 MG PO TABS: 25.0000 mg | ORAL_TABLET | Freq: Every day | ORAL | 3 refills | Status: DC

## 2017-03-03 MED ORDER — VORTIOXETINE HBR 10 MG PO TABS: 10.0000 mg | ORAL_TABLET | Freq: Every day | ORAL | 11 refills | Status: DC

## 2017-03-03 NOTE — Progress Notes (Signed)
Subjective  CC:  Chief Complaint  Patient presents with  . Follow-up    Discuss Labs   . Shortness of Breath    Intermittant   . Joint Swelling    Ankle & Foot   . Hip Pain    HPI: Nicole Jimenez is a 83 y.o. female who presents to the office today for follow up of diabetes, hypertension and problems listed above in the chief complaint.   Diabetic f/u: Her diabetic control is reported as Unchanged. Diet controlled. She denies exertional CP or SOB or symptomatic hypoglycemia. She denies foot sores.    Hypertension f/u: Control is fair . Pt reports she is doing well. taking medications as instructed, no medication side effects noted, no TIAs, no chest pain on exertion.  She denies adverse effects from his BP medications. Compliance with medication is good.   Shortness of breath: new problem; feeling breathless while walking up drive way or taking out trash. No associated chest pain. Had to stop to 'catch breath'.  No pain in the calf or swelling; has chronic LE edema.    Reports increased stress in life; started after death of husband in 02/11/2016, then June 2018 - [place] x 2 months with sister and experienced a fall after tripping, then oct 2018 had blood clot - stressed her out, and having anniversary grief over the holidays due to the loss of her husband. Feeling down. Best friend has stage 4 melanoma. Struggling emotionally from these stressors - could be causing her symptoms. On celexa for many years but no longer really helping. Feels she never grieved the loss of her husband.  Depression screen [facility] 2/9 03/03/2017 01/24/2017  Decreased Interest 1 0  Down, Depressed, Hopeless 1 0  PHQ - 2 Score 2 0  Altered sleeping 1 -  Tired, decreased energy 2 -  Change in appetite 1 -  Feeling bad or failure about yourself  0 -  Trouble concentrating 1 -  Moving slowly or fidgety/restless 1 -  Suicidal thoughts 0 -  PHQ-9 Score 8 -  Difficult doing work/chores Somewhat difficult -      I reviewed the patients updated PMH, FH, and SocHx.  Patient Active Problem List   Diagnosis Date Noted  . Diastolic CHF ([place]) 98/33/8250    Priority: High  . Diet-controlled diabetes mellitus ([place]) 01/24/2017    Priority: High  . Subclinical hyperthyroidism 01/21/2014    Priority: High  . Essential hypertension 02/23/2013    Priority: High  . Hypercholesterolemia 02/23/2013    Priority: High  . DJD of right shoulder 01/24/2017    Priority: Medium  . Bereavement due to life event 03/05/2016    Priority: Medium  . Osteopenia 08/23/2015    Priority: Medium  . Idiopathic scoliosis of thoracic spine 07/09/2014    Priority: Medium  . DDD (degenerative disc disease), lumbosacral 07/15/2012    Priority: Medium  . Lumbosacral spondylosis 07/15/2012    Priority: Medium  . Osteoarthritis of knee 07/15/2012    Priority: Medium  . Bilateral edema of lower extremity 01/26/2017    Priority: Low  . Bilateral hip bursitis 07/17/2015    Priority: Low  . Age-related nuclear cataract of both eyes 03/11/2015    Priority: Low  . Insomnia 09/04/2013    Priority: Low  . Sjogren's syndrome ([place]) 08/04/2012    Priority: Low  . Allergic rhinitis 07/15/2012    Priority: Low  . Embolus of femoral artery ([place]) 01/26/2017   Immunization History  Administered  Date(s) Administered  . Influenza Split 01/24/2012  . Influenza, High Dose Seasonal PF 12/02/2013, 01/20/2015, 12/27/2016  . Influenza,inj,Quad PF,6+ Mos 11/21/2015  . Influenza-Unspecified 01/24/2012, 01/05/2014, 01/05/2014, 01/20/2015  . Pneumococcal Conjugate-13 03/06/2015, 03/06/2015  . Pneumococcal Polysaccharide-23 06/18/2016  . Zoster 10/09/2012   Health Maintenance  Topic Date Due  . TETANUS/TDAP  01/24/2018 (Originally 12/14/1953)  . URINE MICROALBUMIN  05/26/2017  . FOOT EXAM  06/18/2017  . HEMOGLOBIN A1C  07/25/2017  . OPHTHALMOLOGY EXAM  10/19/2017  . INFLUENZA VACCINE  Completed  . DEXA SCAN  Completed  . PNA vac  Low Risk Adult  Completed   Diabetes and HTN Related Lab Review: Lab Results  Component Value Date   HGBA1C 6.0 (H) 01/24/2017   No results found for: [facility] Lab Results  Component Value Date   CREATININE 0.95 (H) 01/24/2017   BUN 22 01/24/2017   NA 139 01/24/2017   K 4.2 01/24/2017   CL 101 01/24/2017   CO2 32 01/24/2017   Lab Results  Component Value Date   CHOL 167 01/24/2017   Lab Results  Component Value Date   HDL 65 01/24/2017   No results found for: [facility] Lab Results  Component Value Date   TRIG 204 (H) 01/24/2017   Lab Results  Component Value Date   CHOLHDL 2.6 01/24/2017   No results found for: LDLDIRECT The ASCVD Risk score Mikey Bussing DC Jr., et al., 2013) failed to calculate for the following reasons:   The 2013 ASCVD risk score is only valid for ages 32 to 95  BP Readings from Last 3 Encounters:  03/03/17 140/88  01/24/17 140/66    Allergies: Patient is allergic to amoxicillin-pot clavulanate; amoxicillin; and clarithromycin. Family History: Patient family history includes Arthritis in her mother and sister; Diabetes in her father and sister; Healthy in her daughter, son, and son; Heart attack in her father and mother; Hyperlipidemia in her father. Social History:  Patient  reports that  has never smoked. she has never used smokeless tobacco. She reports that she does not drink alcohol or use drugs.  Review of Systems: Ophthalmic: negative for eye pain, loss of vision or double vision Cardiovascular: negative for chest pain Respiratory: negative for SOB or persistent cough Gastrointestinal: negative for abdominal pain Genitourinary: negative for dysuria or gross hematuria MSK: negative for foot lesions Neurologic: negative for weakness or gait disturbance Current Meds  Medication Sig  . Acetaminophen 500 MG coapsule Take 1,000 mg by mouth as needed.   . ACIDOPHILUS LACTOBACILLUS PO Take 1 each by mouth daily.   Marland Kitchen aspirin 81 MG  tablet Take 81 mg by mouth.  Marland Kitchen atenolol (TENORMIN) 50 MG tablet Take 25 mg by mouth daily.  . Calcium Carb-Cholecalciferol 600-800 MG-UNIT CHEW Chew 1 each by mouth daily.   Marland Kitchen diltiazem (CARDIZEM CD) 180 MG 24 hr capsule Take 180 mg by mouth every morning.  Marland Kitchen ELIQUIS 5 MG TABS tablet Take 5 mg by mouth 2 (two) times daily.  . Glucosamine-MSM-Hyaluronic Acd (JOINT HEALTH PO) Take 1 tablet by mouth daily.  Javier Docker Oil 500 MG CAPS Take 500 mg by mouth daily.  Marland Kitchen omeprazole (PRILOSEC) 20 MG capsule TAKE ONE CAPSULE BY MOUTH EVERY MORNING 30 MINUTES BEFORE BREAKFAST.  Marland Kitchen pravastatin (PRAVACHOL) 80 MG tablet Take 80 mg by mouth daily.  . [DISCONTINUED] citalopram (CELEXA) 20 MG tablet TAKE 1 TABLET BY MOUTH EVERY DAY    Objective  Vitals: BP 140/88 (BP Location: Left Arm, Patient Position: Sitting, Cuff  Size: Normal)   Pulse 80   Temp 98.1 F (36.7 C) (Oral)   Ht 5' 1.75" (1.568 m)   Wt 167 lb 9.6 oz (76 kg)   SpO2 96%   BMI 30.90 kg/m  General: well appearing, no acute distress but tearful during interview Psych:  Alert and oriented, down mood and affect, slightly hypokinetic, nl cognition and speech HEENT:  Normocephalic, atraumatic, moist mucous membranes, supple neck  Cardiovascular:  Nl S1 and S2, RRR without murmur, gallop or rub.  Respiratory:  Good breath sounds bilaterally, CTAB with normal effort, no rales Gastrointestinal: normal BS, soft, nontender Skin:  Warm, no rashes Neurologic:   Mental status is normal. normal gait Foot exam: no erythema, pallor, or cyanosis visible nl proprioception and sensation to monofilament testing bilaterally, +2 distal pulses bilaterally, tr edema bilaterally   Assessment  1. Essential hypertension   2. Diet-controlled diabetes mellitus ([place])   3. Major depression, chronic   4. Bilateral edema of lower extremity   5. Chronic diastolic congestive heart failure ([place])      Plan   Diabetes is currently very well controlled. Diet  controlled  Hypertension is currently adequately controlled. Add back hctz for blood pressure control and edema; decrease bb to 25 daily. May be contributing to sensation of sob.   Worsening depression: change to trintellix. Educated and counseling. F/u 4-6 weeks.  Diabetic education: ongoing education regarding chronic disease management for diabetes was given today. We continue to reinforce the ABC's of diabetic management: A1c (<7 or 8 dependent upon patient), tight blood pressure control, and cholesterol management with goal LDL < 100 minimally. We discuss diet strategies, exercise recommendations, medication options and possible side effects. At each visit, we review recommended immunizations and preventive care recommendations for diabetics and stress that good diabetic control can prevent other problems. See below for this patient's data. Hypertension education: ongoing education regarding management of these chronic disease states was given. Management strategies discussed on successive visits include dietary and exercise recommendations, goals of achieving and maintaining IBW, and lifestyle modifications aiming for adequate sleep and minimizing stressors.   Follow up: Return in about 6 weeks (around 04/14/2017) for AWV at patient's convenience, follow up Hypertension, mood follow up..   Commons side effects, risks, benefits, and alternatives for medications and treatment plan prescribed today were discussed, and the patient expressed understanding of the given instructions. Patient is instructed to call or message via MyChart if he/she has any questions or concerns regarding our treatment plan. No barriers to understanding were identified. We discussed Red Flag symptoms and signs in detail. Patient expressed understanding regarding what to do in case of urgent or emergency type symptoms.   Medication list was reconciled, printed and provided to the patient in AVS. Patient instructions and summary  information was reviewed with the patient as documented in the AVS. This note was prepared with assistance of Dragon voice recognition software. Occasional wrong-word or sound-a-like substitutions may have occurred due to the inherent limitations of voice recognition software  No orders of the defined types were placed in this encounter.  Meds ordered this encounter  Medications  . hydrochlorothiazide (HYDRODIURIL) 25 MG tablet    Sig: Take 1 tablet (25 mg total) by mouth daily.    Dispense:  90 tablet    Refill:  3  . vortioxetine HBr (TRINTELLIX) 10 MG TABS    Sig: Take 1 tablet (10 mg total) by mouth daily.    Dispense:  30 tablet  Refill:  11

## 2017-03-03 NOTE — Patient Instructions (Addendum)
Please return in 6 weeks for a blood pressure and depression follow up appointment  Medicare recommends an Annual Wellness Visit for all patients. Please schedule this to be done with our Nurse Educator, Maudie Mercury. This is an informative "talk" visit; it's goals are to ensure that your health care needs are being met and to give you education regarding avoiding falls, ensuring you are not suffering from depression or problems with memory or thinking, and to educate you on Advance Care Planning. It helps me take good care of you!  If you have any questions or concerns, please don't hesitate to send me a message via MyChart or call the office at [phone]. Thank you for visiting with [place] today! It's our pleasure caring for you.   Major Depressive Disorder, Adult Major depressive disorder (MDD) is a mental health condition. It may also be called clinical depression or unipolar depression. MDD usually causes feelings of sadness, hopelessness, or helplessness. MDD can also cause physical symptoms. It can interfere with work, school, relationships, and other everyday activities. MDD may be mild, moderate, or severe. It may occur once (single episode major depressive disorder) or it may occur multiple times (recurrent major depressive disorder). What are the causes? The exact cause of this condition is not known. MDD is most likely caused by a combination of things, which may include:  Genetic factors. These are traits that are passed along from parent to child.  Individual factors. Your personality, your behavior, and the way you handle your thoughts and feelings may contribute to MDD. This includes personality traits and behaviors learned from others.  Physical factors, such as: ? Differences in the part of your brain that controls emotion. This part of your brain may be different than it is in people who do not have MDD. ? Long-term (chronic) medical or psychiatric illnesses.  Social factors. Traumatic  experiences or major life changes may play a role in the development of MDD.  What increases the risk? This condition is more likely to develop in women. The following factors may also make you more likely to develop MDD:  A family history of depression.  Troubled family relationships.  Abnormally low levels of certain brain chemicals.  Traumatic events in childhood, especially abuse or the loss of a parent.  Being under a lot of stress, or long-term stress, especially from upsetting life experiences or losses.  A history of: ? Chronic physical illness. ? Other mental health disorders. ? Substance abuse.  Poor living conditions.  Experiencing social exclusion or discrimination on a regular basis.  What are the signs or symptoms? The main symptoms of MDD typically include:  Constant depressed or irritable mood.  Loss of interest in things and activities.  MDD symptoms may also include:  Sleeping or eating too much or too little.  Unexplained weight change.  Fatigue or low energy.  Feelings of worthlessness or guilt.  Difficulty thinking clearly or making decisions.  Thoughts of suicide or of harming others.  Physical agitation or weakness.  Isolation.  Severe cases of MDD may also occur with other symptoms, such as:  Delusions or hallucinations, in which you imagine things that are not real (psychotic depression).  Low-level depression that lasts at least a year (chronic depression or persistent depressive disorder).  Extreme sadness and hopelessness (melancholic depression).  Trouble speaking and moving (catatonic depression).  How is this diagnosed? This condition may be diagnosed based on:  Your symptoms.  Your medical history, including your mental health history. This  may involve tests to evaluate your mental health. You may be asked questions about your lifestyle, including any drug and alcohol use, and how long you have had symptoms of MDD.  A  physical exam.  Blood tests to rule out other conditions.  You must have a depressed mood and at least four other MDD symptoms most of the day, nearly every day in the same 2-week timeframe before your health care provider can confirm a diagnosis of MDD. How is this treated? This condition is usually treated by mental health professionals, such as psychologists, psychiatrists, and clinical social workers. You may need more than one type of treatment. Treatment may include:  Psychotherapy. This is also called talk therapy or counseling. Types of psychotherapy include: ? Cognitive behavioral therapy (CBT). This type of therapy teaches you to recognize unhealthy feelings, thoughts, and behaviors, and replace them with positive thoughts and actions. ? Interpersonal therapy (IPT). This helps you to improve the way you relate to and communicate with others. ? Family therapy. This treatment includes members of your family.  Medicine to treat anxiety and depression, or to help you control certain emotions and behaviors.  Lifestyle changes, such as: ? Limiting alcohol and drug use. ? Exercising regularly. ? Getting plenty of sleep. ? Making healthy eating choices. ? Spending more time outdoors.  Treatments involving stimulation of the brain can be used in situations with extremely severe symptoms, or when medicine or other therapies do not work over time. These treatments include electroconvulsive therapy, transcranial magnetic stimulation, and vagal nerve stimulation. Follow these instructions at home: Activity  Return to your normal activities as told by your health care provider.  Exercise regularly and spend time outdoors as told by your health care provider. General instructions  Take over-the-counter and prescription medicines only as told by your health care provider.  Do not drink alcohol. If you drink alcohol, limit your alcohol intake to no more than 1 drink a day for nonpregnant  women and 2 drinks a day for men. One drink equals 12 oz of beer, 5 oz of wine, or 1 oz of hard liquor. Alcohol can affect any antidepressant medicines you are taking. Talk to your health care provider about your alcohol use.  Eat a healthy diet and get plenty of sleep.  Find activities that you enjoy doing, and make time to do them.  Consider joining a support group. Your health care provider may be able to recommend a support group.  Keep all follow-up visits as told by your health care provider. This is important. Where to find more information: [employer] on Mental Illness  www.nami.org  U.S. National Institute of Mental Health  https://carter.com/  National Suicide Prevention Lifeline  1-800-273-TALK (216)080-8622). This is free, 24-hour help.  Contact a health care provider if:  Your symptoms get worse.  You develop new symptoms. Get help right away if:  You self-harm.  You have serious thoughts about hurting yourself or others.  You see, hear, taste, smell, or feel things that are not present (hallucinate). This information is not intended to replace advice given to you by your health care provider. Make sure you discuss any questions you have with your health care provider. Document Released: 05/22/2012 Document Revised: 10/02/2015 Document Reviewed: 08/06/2015 Elsevier Interactive Patient Education  [employer].

## 2017-03-31 DIAGNOSIS — E119 Type 2 diabetes mellitus without complications: Secondary | ICD-10-CM | POA: Diagnosis not present

## 2017-03-31 DIAGNOSIS — E0590 Thyrotoxicosis, unspecified without thyrotoxic crisis or storm: Secondary | ICD-10-CM | POA: Diagnosis not present

## 2017-03-31 DIAGNOSIS — I10 Essential (primary) hypertension: Secondary | ICD-10-CM | POA: Diagnosis not present

## 2017-03-31 DIAGNOSIS — F329 Major depressive disorder, single episode, unspecified: Secondary | ICD-10-CM | POA: Diagnosis not present

## 2017-03-31 DIAGNOSIS — E559 Vitamin D deficiency, unspecified: Secondary | ICD-10-CM | POA: Diagnosis not present

## 2017-04-13 ENCOUNTER — Other Ambulatory Visit: Payer: Self-pay | Admitting: Family Medicine

## 2017-04-14 ENCOUNTER — Other Ambulatory Visit: Payer: Self-pay

## 2017-04-14 ENCOUNTER — Encounter: Payer: Self-pay | Admitting: Family Medicine

## 2017-04-14 ENCOUNTER — Ambulatory Visit (INDEPENDENT_AMBULATORY_CARE_PROVIDER_SITE_OTHER): Payer: Medicare Other | Admitting: Family Medicine

## 2017-04-14 VITALS — BP 142/82 | HR 51 | Temp 98.1°F | Resp 17 | Ht 61.75 in | Wt 158.8 lb

## 2017-04-14 DIAGNOSIS — R600 Localized edema: Secondary | ICD-10-CM

## 2017-04-14 DIAGNOSIS — I743 Embolism and thrombosis of arteries of the lower extremities: Secondary | ICD-10-CM | POA: Diagnosis not present

## 2017-04-14 DIAGNOSIS — I10 Essential (primary) hypertension: Secondary | ICD-10-CM

## 2017-04-14 DIAGNOSIS — F341 Dysthymic disorder: Secondary | ICD-10-CM | POA: Diagnosis not present

## 2017-04-14 DIAGNOSIS — F329 Major depressive disorder, single episode, unspecified: Secondary | ICD-10-CM

## 2017-04-14 NOTE — Progress Notes (Signed)
Subjective  CC:  Chief Complaint  Patient presents with  . Follow-up    Doing much better, wants to go over medications Diltiazem and Citalopram    HPI: Nicole Jimenez is a 83 y.o. female who presents to the office today to address the problems listed above in the chief complaint, mood problems.  Since last visit, now on trintellix. Doing better. Reports that after counseling from last visit, felt better. Was reassured that she had reactive depression and since, has not had any more fatigue or sob. Her energy has improved. She is happier. Medications are helping w/o AEs.   HTN f/u: last visit added back hctz for le edema and cut back on bb. LE edema has since resolved. No cp. No sob. No palpitations.   GERD - needs refill of prilosec  On aspiring and eloquis due to h/o femoral artery embolus, no bleeding or abdominal pain or melena.  I reviewed the patients updated PMH, FH, and SocHx.    Patient Active Problem List   Diagnosis Date Noted  . Diastolic CHF ([place]) 62/37/6283    Priority: High  . Diet-controlled diabetes mellitus ([place]) 01/24/2017    Priority: High  . Subclinical hyperthyroidism 01/21/2014    Priority: High  . Essential hypertension 02/23/2013    Priority: High  . Hypercholesterolemia 02/23/2013    Priority: High  . DJD of right shoulder 01/24/2017    Priority: Medium  . Bereavement due to life event 03/05/2016    Priority: Medium  . Osteopenia 08/23/2015    Priority: Medium  . Idiopathic scoliosis of thoracic spine 07/09/2014    Priority: Medium  . DDD (degenerative disc disease), lumbosacral 07/15/2012    Priority: Medium  . Lumbosacral spondylosis 07/15/2012    Priority: Medium  . Osteoarthritis of knee 07/15/2012    Priority: Medium  . Bilateral edema of lower extremity 01/26/2017    Priority: Low  . Bilateral hip bursitis 07/17/2015    Priority: Low  . Age-related nuclear cataract of both eyes 03/11/2015    Priority: Low  . Insomnia 09/04/2013      Priority: Low  . Sjogren's syndrome ([place]) 08/04/2012    Priority: Low  . Allergic rhinitis 07/15/2012    Priority: Low  . Major depression, chronic 04/14/2017  . Embolus of femoral artery ([place]) 01/26/2017   Current Meds  Medication Sig  . Acetaminophen 500 MG coapsule Take 1,000 mg by mouth as needed.   . ACIDOPHILUS LACTOBACILLUS PO Take 1 each by mouth daily.   Marland Kitchen aspirin 81 MG tablet Take 81 mg by mouth.  Marland Kitchen atenolol (TENORMIN) 50 MG tablet Take 50 mg by mouth daily.  . Calcium Carb-Cholecalciferol 600-800 MG-UNIT CHEW Chew 1 each by mouth daily.   Marland Kitchen ELIQUIS 5 MG TABS tablet TAKE 1 TABLET BY MOUTH TWICE A DAY  . Glucosamine-MSM-Hyaluronic Acd (JOINT HEALTH PO) Take 1 tablet by mouth daily.  Marland Kitchen glucose blood (ONE TOUCH ULTRA TEST) test strip USE TWICE DAILY TO CHECK BLOOD SUGAR - E11.40  . hydrochlorothiazide (HYDRODIURIL) 25 MG tablet Take 1 tablet (25 mg total) by mouth daily.  Javier Docker Oil 500 MG CAPS Take 500 mg by mouth daily.  Marland Kitchen vortioxetine HBr (TRINTELLIX) 10 MG TABS Take 1 tablet (10 mg total) by mouth daily.  . [DISCONTINUED] diltiazem (CARDIZEM CD) 180 MG 24 hr capsule Take 180 mg by mouth every morning.  . [DISCONTINUED] omeprazole (PRILOSEC) 20 MG capsule TAKE ONE CAPSULE BY MOUTH EVERY MORNING 30 MINUTES BEFORE BREAKFAST.  . [  DISCONTINUED] pravastatin (PRAVACHOL) 80 MG tablet Take 80 mg by mouth daily.    Allergies: Patient is allergic to amoxicillin-pot clavulanate; amoxicillin; and clarithromycin. Family history:  Patient family history includes Arthritis in her mother and sister; Diabetes in her father and sister; Healthy in her daughter, son, and son; Heart attack in her father and mother; Hyperlipidemia in her father. Social History   Socioeconomic History  . Marital status: Widowed    Spouse name: None  . Number of children: None  . Years of education: None  . Highest education level: None  Social Needs  . Financial resource strain: None  . Food  insecurity - worry: None  . Food insecurity - inability: None  . Transportation needs - medical: None  . Transportation needs - non-medical: None  Occupational History  . Occupation: retired Scientist, physiological  Tobacco Use  . Smoking status: Never Smoker  . Smokeless tobacco: Never Used  Substance and Sexual Activity  . Alcohol use: No    Frequency: Never  . Drug use: No  . Sexual activity: No  Other Topics Concern  . None  Social History Narrative  . None     Review of Systems: Constitutional: Negative for fever malaise or anorexia Cardiovascular: negative for chest pain Respiratory: negative for SOB or persistent cough Gastrointestinal: negative for abdominal pain  Objective  Vitals: BP (!) 142/82   Pulse (!) 51   Temp 98.1 F (36.7 C) (Oral)   Resp 17   Ht 5' 1.75" (1.568 m)   Wt 158 lb 12.8 oz (72 kg)   SpO2 96%   BMI 29.28 kg/m  General: no acute distress, well appearing, no apparent distress, well groomed Psych:  Alert and oriented x 3,normal mood, behavior, speech, dress, and thought processes. Much happier today Cardiovascular:  RRR without murmur or gallop. no peripheral edema Respiratory:  Good breath sounds bilaterally, CTAB with normal respiratory effort Skin:  Warm, no rashes   Assessment  1. Major depression, chronic   2. Essential hypertension   3. Bilateral edema of lower extremity   4. Embolus of femoral artery ([place])      Plan   Depression:  Improved on trintellix. Continue meds.   bp is fairly well controlled. Add back atenolol to 50 daily and continue dilt and hctz. Refilled meds today.   Continue blood thinners. Monitor for bleeding.   Return for AWV  Follow up: Return in about 3 months (around 07/15/2017) for mood follow up.    Commons side effects, risks, benefits, and alternatives for medications and treatment plan prescribed today were discussed, and the patient expressed understanding of the given instructions. Patient is instructed to  call or message via MyChart if he/she has any questions or concerns regarding our treatment plan. No barriers to understanding were identified. We discussed Red Flag symptoms and signs in detail. Patient expressed understanding regarding what to do in case of urgent or emergency type symptoms.   Medication list was reconciled, printed and provided to the patient in AVS. Patient instructions and summary information was reviewed with the patient as documented in the AVS. This note was prepared with assistance of Dragon voice recognition software. Occasional wrong-word or sound-a-like substitutions may have occurred due to the inherent limitations of voice recognition software  No orders of the defined types were placed in this encounter.  No orders of the defined types were placed in this encounter.

## 2017-04-14 NOTE — Patient Instructions (Signed)
Please return in 3 months for recheck mood.   Medicare recommends an Annual Wellness Visit for all patients. Please schedule this to be done with our Nurse Educator, Maudie Mercury. This is an informative "talk" visit; it's goals are to ensure that your health care needs are being met and to give you education regarding avoiding falls, ensuring you are not suffering from depression or problems with memory or thinking, and to educate you on Advance Care Planning. It helps me take good care of you!  If you have any questions or concerns, please don't hesitate to send me a message via MyChart or call the office at [phone]. Thank you for visiting with [place] today! It's our pleasure caring for you.

## 2017-05-25 ENCOUNTER — Other Ambulatory Visit: Payer: Self-pay | Admitting: Family Medicine

## 2017-05-25 NOTE — Telephone Encounter (Signed)
To see Vascular in June or July to assess further need of Eloquis

## 2017-06-30 ENCOUNTER — Ambulatory Visit: Payer: Medicare Other

## 2017-07-07 NOTE — Progress Notes (Signed)
Subjective:   Nicole Jimenez is a 83 y.o. female who presents for an Initial Medicare Annual Wellness Visit.  Review of Systems    No ROS.  Medicare Wellness Visit. Additional risk factors are reflected in the social history.  Cardiac Risk Factors include: advanced age (>9men, >22 women);diabetes mellitus;hypertension;dyslipidemia;sedentary lifestyle;family history of premature cardiovascular disease   Sleep patterns: Sleeps 6-8 hours. Up to void x 2.  Home Safety/Smoke Alarms: Feels safe in home. Smoke alarms in place.  Living environment; residence and Firearm Safety: Lives alone in 2 story home, bedroom downstairs.  Seat Belt Safety/Bike Helmet: Wears seat belt.   Female:   Pap-N/A      Mammo-Pt reports 01/08/2017, pt reports normal. [facility]. Will call for records.      Dexa scan-Pt reports 02/09/2015, pt reports normal. [facility]. Will call for records.         CCS-N/A     Objective:    Today's Vitals   07/08/17 1405  BP: (!) 142/86  Pulse: 82  Resp: 16  Temp: 98 F (36.7 C)  TempSrc: Temporal  SpO2: 97%  Weight: 160 lb 8 oz (72.8 kg)  Height: 5\' 2"  (1.575 m)  PainSc: 2    Body mass index is 29.36 kg/m.  Advanced Directives 07/08/2017  Does Patient Have a Medical Advance Directive? Yes  Type of Paramedic of [place];Living will  Copy of [place] in Chart? No - copy requested    Current Medications (verified) Outpatient Encounter Medications as of 07/08/2017  Medication Sig  . Acetaminophen 500 MG coapsule Take 1,000 mg by mouth as needed.   . ACIDOPHILUS LACTOBACILLUS PO Take 1 each by mouth daily.   Marland Kitchen aspirin 81 MG tablet Take 81 mg by mouth.  Marland Kitchen atenolol (TENORMIN) 50 MG tablet Take 50 mg by mouth daily.  . Calcium Carb-Cholecalciferol 600-800 MG-UNIT CHEW Chew 1 each by mouth daily.   Marland Kitchen diltiazem (CARDIZEM CD) 180 MG 24 hr capsule TAKE ONE CAPSULE BY MOUTH IN THE MORNING  . ELIQUIS 5 MG TABS tablet TAKE 1 TABLET BY  MOUTH TWICE A DAY  . Glucosamine-MSM-Hyaluronic Acd (JOINT HEALTH PO) Take 1 tablet by mouth daily.  Marland Kitchen glucose blood (ONE TOUCH ULTRA TEST) test strip USE TWICE DAILY TO CHECK BLOOD SUGAR - E11.40  . hydrochlorothiazide (HYDRODIURIL) 25 MG tablet Take 1 tablet (25 mg total) by mouth daily.  Javier Docker Oil 500 MG CAPS Take 500 mg by mouth daily.  . Multiple Vitamins-Minerals (MULTIVITAMIN ADULT PO) Take by mouth.  Marland Kitchen omeprazole (PRILOSEC) 20 MG capsule TAKE ONE CAPSULE BY MOUTH EVERY MORNING 30 MINUTES BEFORE BREAKFAST.  Marland Kitchen pravastatin (PRAVACHOL) 80 MG tablet TAKE 1 TABLET BY MOUTH EVERY DAY  . vortioxetine HBr (TRINTELLIX) 10 MG TABS Take 1 tablet (10 mg total) by mouth daily.   No facility-administered encounter medications on file as of 07/08/2017.     Allergies (verified) Amoxicillin-pot clavulanate; Amoxicillin; and Clarithromycin   History: Past Medical History:  Diagnosis Date  . Arthritis   . Depression   . Diabetes mellitus without complication (HCC)    Borderline  . Diastolic CHF ([place]) 1/74/9449   Echo 11/2016, nl EF but LVH and diastolic dysfunction  . GERD (gastroesophageal reflux disease)   . Hyperlipidemia   . Hypertension   . Peripheral arterial disease ([place]) 2018   thromboembolism of femoral artery  . Thyroid disease    subclinical hyperthyroidism managed by endocrinology   Past Surgical History:  Procedure Laterality  Date  . FEMORAL ENDARTERECTOMY Right 11/29/2016   Family History  Problem Relation Age of Onset  . Heart attack Mother   . Arthritis Mother   . Diabetes Father   . Heart attack Father   . Hyperlipidemia Father   . Arthritis Sister   . Diabetes Sister        adopted  . Healthy Daughter   . Healthy Son   . Healthy Son   . Other Son        cyst on thyroid   Social History   Socioeconomic History  . Marital status: Widowed    Spouse name: Not on file  . Number of children: Not on file  . Years of education: Not on file  . Highest  education level: Not on file  Occupational History  . Occupation: retired Arts administrator  . Financial resource strain: Not on file  . Food insecurity:    Worry: Not on file    Inability: Not on file  . Transportation needs:    Medical: Not on file    Non-medical: Not on file  Tobacco Use  . Smoking status: Never Smoker  . Smokeless tobacco: Never Used  Substance and Sexual Activity  . Alcohol use: No    Frequency: Never  . Drug use: No  . Sexual activity: Never  Lifestyle  . Physical activity:    Days per week: Not on file    Minutes per session: Not on file  . Stress: Not on file  Relationships  . Social connections:    Talks on phone: Not on file    Gets together: Not on file    Attends religious service: Not on file    Active member of club or organization: Not on file    Attends meetings of clubs or organizations: Not on file    Relationship status: Not on file  Other Topics Concern  . Not on file  Social History Narrative  . Not on file    Tobacco Counseling Counseling given: Not Answered    Activities of Daily Living In your present state of health, do you have any difficulty performing the following activities: 07/08/2017 04/14/2017  Hearing? Y N  Vision? N N  Difficulty concentrating or making decisions? N N  Walking or climbing stairs? N N  Dressing or bathing? N N  Doing errands, shopping? N N  Preparing Food and eating ? N -  Using the Toilet? N -  In the past six months, have you accidently leaked urine? N -  Do you have problems with loss of bowel control? N -  Managing your Medications? N -  Managing your Finances? N -  Housekeeping or managing your Housekeeping? N -     Immunizations and Health Maintenance Immunization History  Administered Date(s) Administered  . Influenza Split 01/24/2012  . Influenza, High Dose Seasonal PF 12/02/2013, 01/20/2015, 12/27/2016  . Influenza,inj,Quad PF,6+ Mos 11/21/2015  . Influenza-Unspecified  01/24/2012, 01/05/2014, 01/05/2014, 01/20/2015  . Pneumococcal Conjugate-13 03/06/2015, 03/06/2015  . Pneumococcal Polysaccharide-23 06/18/2016  . Zoster 10/09/2012   Health Maintenance Due  Topic Date Due  . URINE MICROALBUMIN  05/26/2017  . FOOT EXAM  06/18/2017    Patient Care Team: Leamon Arnt, MD as PCP - General (Family Medicine) Roel Cluck, MD as Referring Physician (Ophthalmology) Alanson Aly, MD as Consulting Physician (Endocrinology) Dinah Beers, MD as Referring Physician (Vascular Surgery) Gaynelle Arabian, MD as Consulting Physician (Orthopedic Surgery)  Indicate any  recent Medical Services you may have received from other than [facility] providers in the past year (date may be approximate).     Assessment:   This is a routine wellness examination for [place].  Hearing/Vision screen Hearing Screening Comments: Failed whisper test, will consider Audiology consult.  Vision Screening Comments: Last exam 10/19/2016, yearly. Dr. Rex Kras. Wears glasses.   Dietary issues and exercise activities discussed: Current Exercise Habits: The patient does not participate in regular exercise at present(Maintains household), Exercise limited by: None identified   Diet (meal preparation, eat out, water intake, caffeinated beverages, dairy products, fruits and vegetables): Drinks V8, water, coffee and Ginger beer.   Breakfast: pancake; mini bagel w/cream cheese; toast w/banana; egg; coffee Lunch: salad; canned peach w/ cottage cheese; instant noodles Dinner: left overs; vegetables; protein/rice  Goals    . Increase physical activity     Increase activity.       Depression Screen PHQ 2/9 Scores 07/08/2017 03/03/2017 01/24/2017  PHQ - 2 Score 1 2 0  PHQ- 9 Score 3 8 -    Fall Risk Fall Risk  07/08/2017 04/14/2017 01/24/2017  Falls in the past year? Yes No Yes  Comment fell on uneven pavement - -  Number falls in past yr: 1 - 1  Injury with Fall? No - No  Risk for  fall due to : - - Impaired balance/gait  Follow up Falls prevention discussed - -     Cognitive Function: MMSE - Mini Mental State Exam 07/08/2017  Orientation to time 5  Orientation to Place 5  Registration 3  Attention/ Calculation 5  Recall 1  Language- name 2 objects 2  Language- repeat 1  Language- follow 3 step command 3  Language- read & follow direction 1  Write a sentence 1  Copy design 1  Total score 28        Screening Tests Health Maintenance  Topic Date Due  . URINE MICROALBUMIN  05/26/2017  . FOOT EXAM  06/18/2017  . TETANUS/TDAP  01/24/2018 (Originally 12/14/1953)  . HEMOGLOBIN A1C  07/25/2017  . INFLUENZA VACCINE  09/08/2017  . OPHTHALMOLOGY EXAM  10/19/2017  . DEXA SCAN  Completed  . PNA vac Low Risk Adult  Completed   Diabetic Foot Exam - Simple   Simple Foot Form Diabetic Foot exam was performed with the following findings:  Yes 07/08/2017  2:45 PM  Visual Inspection No deformities, no ulcerations, no other skin breakdown bilaterally:  Yes Sensation Testing Intact to touch and monofilament testing bilaterally:  Yes Pulse Check Posterior Tibialis and Dorsalis pulse intact bilaterally:  Yes Comments         Plan:     Shingles vaccine at pharmacy.   Bring a copy of your living will and/or healthcare power of attorney to your next office visit.  Continue doing brain stimulating activities (puzzles, reading, adult coloring books, staying active) to keep memory sharp.   I have personally reviewed and noted the following in the patient's chart:   . Medical and social history . Use of alcohol, tobacco or illicit drugs  . Current medications and supplements . Functional ability and status . Nutritional status . Physical activity . Advanced directives . List of other physicians . Hospitalizations, surgeries, and ER visits in previous 12 months . Vitals . Screenings to include cognitive, depression, and falls . Referrals and  appointments  In addition, I have reviewed and discussed with patient certain preventive protocols, quality metrics, and best practice recommendations. A written personalized care  plan for preventive services as well as general preventive health recommendations were provided to patient.     Gerilyn Nestle, RN   07/08/2017    PCP Notes: -PHQ9=3. Pt states she is not sure new medication is working, considering stopping.  -Right arm pain when lifted up x 2-3 months.  -Will obtain mammo and dexa from [place].  -Requesting letter from PCP for discount on fitness center membership (encouraged to call insurance to determine eligibility).  -F/U with PCP 07/11/2017.

## 2017-07-08 ENCOUNTER — Ambulatory Visit (INDEPENDENT_AMBULATORY_CARE_PROVIDER_SITE_OTHER): Payer: Medicare Other

## 2017-07-08 ENCOUNTER — Other Ambulatory Visit: Payer: Self-pay

## 2017-07-08 VITALS — BP 142/86 | HR 82 | Temp 98.0°F | Resp 16 | Ht 62.0 in | Wt 160.5 lb

## 2017-07-08 DIAGNOSIS — Z0000 Encounter for general adult medical examination without abnormal findings: Secondary | ICD-10-CM

## 2017-07-08 NOTE — Patient Instructions (Addendum)
Shingles vaccine at pharmacy.   Bring a copy of your living will and/or healthcare power of attorney to your next office visit.  Continue doing brain stimulating activities (puzzles, reading, adult coloring books, staying active) to keep memory sharp.   Health Maintenance, Female Adopting a healthy lifestyle and getting preventive care can go a long way to promote health and wellness. Talk with your health care provider about what schedule of regular examinations is right for you. This is a good chance for you to check in with your provider about disease prevention and staying healthy. In between checkups, there are plenty of things you can do on your own. Experts have done a lot of research about which lifestyle changes and preventive measures are most likely to keep you healthy. Ask your health care provider for more information. Weight and diet Eat a healthy diet  Be sure to include plenty of vegetables, fruits, low-fat dairy products, and lean protein.  Do not eat a lot of foods high in solid fats, added sugars, or salt.  Get regular exercise. This is one of the most important things you can do for your health. ? Most adults should exercise for at least 150 minutes each week. The exercise should increase your heart rate and make you sweat (moderate-intensity exercise). ? Most adults should also do strengthening exercises at least twice a week. This is in addition to the moderate-intensity exercise.  Maintain a healthy weight  Body mass index (BMI) is a measurement that can be used to identify possible weight problems. It estimates body fat based on height and weight. Your health care provider can help determine your BMI and help you achieve or maintain a healthy weight.  For females 81 years of age and older: ? A BMI below 18.5 is considered underweight. ? A BMI of 18.5 to 24.9 is normal. ? A BMI of 25 to 29.9 is considered overweight. ? A BMI of 30 and above is considered  obese.  Watch levels of cholesterol and blood lipids  You should start having your blood tested for lipids and cholesterol at 81 years of age, then have this test every 5 years.  You may need to have your cholesterol levels checked more often if: ? Your lipid or cholesterol levels are high. ? You are older than 81 years of age. ? You are at high risk for heart disease.  Cancer screening Lung Cancer  Lung cancer screening is recommended for adults 81-23 years old who are at high risk for lung cancer because of a history of smoking.  A yearly low-dose CT scan of the lungs is recommended for people who: ? Currently smoke. ? Have quit within the past 15 years. ? Have at least a 30-pack-year history of smoking. A pack year is smoking an average of one pack of cigarettes a day for 1 year.  Yearly screening should continue until it has been 15 years since you quit.  Yearly screening should stop if you develop a health problem that would prevent you from having lung cancer treatment.  Breast Cancer  Practice breast self-awareness. This means understanding how your breasts normally appear and feel.  It also means doing regular breast self-exams. Let your health care provider know about any changes, no matter how small.  If you are in your 20s or 30s, you should have a clinical breast exam (CBE) by a health care provider every 1-3 years as part of a regular health exam.  If you are 40  or older, have a CBE every year. Also consider having a breast X-ray (mammogram) every year.  If you have a family history of breast cancer, talk to your health care provider about genetic screening.  If you are at high risk for breast cancer, talk to your health care provider about having an MRI and a mammogram every year.  Breast cancer gene (BRCA) assessment is recommended for women who have family members with BRCA-related cancers. BRCA-related cancers  include: ? Breast. ? Ovarian. ? Tubal. ? Peritoneal cancers.  Results of the assessment will determine the need for genetic counseling and BRCA1 and BRCA2 testing.  Cervical Cancer Your health care provider may recommend that you be screened regularly for cancer of the pelvic organs (ovaries, uterus, and vagina). This screening involves a pelvic examination, including checking for microscopic changes to the surface of your cervix (Pap test). You may be encouraged to have this screening done every 3 years, beginning at age 21.  For women ages 30-65, health care providers may recommend pelvic exams and Pap testing every 3 years, or they may recommend the Pap and pelvic exam, combined with testing for human papilloma virus (HPV), every 5 years. Some types of HPV increase your risk of cervical cancer. Testing for HPV may also be done on women of any age with unclear Pap test results.  Other health care providers may not recommend any screening for nonpregnant women who are considered low risk for pelvic cancer and who do not have symptoms. Ask your health care provider if a screening pelvic exam is right for you.  If you have had past treatment for cervical cancer or a condition that could lead to cancer, you need Pap tests and screening for cancer for at least 20 years after your treatment. If Pap tests have been discontinued, your risk factors (such as having a new sexual partner) need to be reassessed to determine if screening should resume. Some women have medical problems that increase the chance of getting cervical cancer. In these cases, your health care provider may recommend more frequent screening and Pap tests.  Colorectal Cancer  This type of cancer can be detected and often prevented.  Routine colorectal cancer screening usually begins at 81 years of age and continues through 81 years of age.  Your health care provider may recommend screening at an earlier age if you have risk factors  for colon cancer.  Your health care provider may also recommend using home test kits to check for hidden blood in the stool.  A small camera at the end of a tube can be used to examine your colon directly (sigmoidoscopy or colonoscopy). This is done to check for the earliest forms of colorectal cancer.  Routine screening usually begins at age 50.  Direct examination of the colon should be repeated every 5-10 years through 81 years of age. However, you may need to be screened more often if early forms of precancerous polyps or small growths are found.  Skin Cancer  Check your skin from head to toe regularly.  Tell your health care provider about any new moles or changes in moles, especially if there is a change in a mole's shape or color.  Also tell your health care provider if you have a mole that is larger than the size of a pencil eraser.  Always use sunscreen. Apply sunscreen liberally and repeatedly throughout the day.  Protect yourself by wearing long sleeves, pants, a wide-brimmed hat, and sunglasses whenever you are   outside.  Heart disease, diabetes, and high blood pressure  High blood pressure causes heart disease and increases the risk of stroke. High blood pressure is more likely to develop in: ? People who have blood pressure in the high end of the normal range (130-139/85-89 mm Hg). ? People who are overweight or obese. ? People who are African American.  If you are 28-9 years of age, have your blood pressure checked every 3-5 years. If you are 47 years of age or older, have your blood pressure checked every year. You should have your blood pressure measured twice-once when you are at a hospital or clinic, and once when you are not at a hospital or clinic. Record the average of the two measurements. To check your blood pressure when you are not at a hospital or clinic, you can use: ? An automated blood pressure machine at a pharmacy. ? A home blood pressure monitor.  If  you are between 9 years and 30 years old, ask your health care provider if you should take aspirin to prevent strokes.  Have regular diabetes screenings. This involves taking a blood sample to check your fasting blood sugar level. ? If you are at a normal weight and have a low risk for diabetes, have this test once every three years after 81 years of age. ? If you are overweight and have a high risk for diabetes, consider being tested at a younger age or more often. Preventing infection Hepatitis B  If you have a higher risk for hepatitis B, you should be screened for this virus. You are considered at high risk for hepatitis B if: ? You were born in a country where hepatitis B is common. Ask your health care provider which countries are considered high risk. ? Your parents were born in a high-risk country, and you have not been immunized against hepatitis B (hepatitis B vaccine). ? You have HIV or AIDS. ? You use needles to inject street drugs. ? You live with someone who has hepatitis B. ? You have had sex with someone who has hepatitis B. ? You get hemodialysis treatment. ? You take certain medicines for conditions, including cancer, organ transplantation, and autoimmune conditions.  Hepatitis C  Blood testing is recommended for: ? Everyone born from [birth year] through 1963. ? Anyone with known risk factors for hepatitis C.  Sexually transmitted infections (STIs)  You should be screened for sexually transmitted infections (STIs) including gonorrhea and chlamydia if: ? You are sexually active and are younger than 81 years of age. ? You are older than 81 years of age and your health care provider tells you that you are at risk for this type of infection. ? Your sexual activity has changed since you were last screened and you are at an increased risk for chlamydia or gonorrhea. Ask your health care provider if you are at risk.  If you do not have HIV, but are at risk, it may be recommended  that you take a prescription medicine daily to prevent HIV infection. This is called pre-exposure prophylaxis (PrEP). You are considered at risk if: ? You are sexually active and do not regularly use condoms or know the HIV status of your partner(s). ? You take drugs by injection. ? You are sexually active with a partner who has HIV.  Talk with your health care provider about whether you are at high risk of being infected with HIV. If you choose to begin PrEP, you should first be tested  for HIV. You should then be tested every 3 months for as long as you are taking PrEP. Pregnancy  If you are premenopausal and you may become pregnant, ask your health care provider about preconception counseling.  If you may become pregnant, take 400 to 800 micrograms (mcg) of folic acid every day.  If you want to prevent pregnancy, talk to your health care provider about birth control (contraception). Osteoporosis and menopause  Osteoporosis is a disease in which the bones lose minerals and strength with aging. This can result in serious bone fractures. Your risk for osteoporosis can be identified using a bone density scan.  If you are 88 years of age or older, or if you are at risk for osteoporosis and fractures, ask your health care provider if you should be screened.  Ask your health care provider whether you should take a calcium or vitamin D supplement to lower your risk for osteoporosis.  Menopause may have certain physical symptoms and risks.  Hormone replacement therapy may reduce some of these symptoms and risks. Talk to your health care provider about whether hormone replacement therapy is right for you. Follow these instructions at home:  Schedule regular health, dental, and eye exams.  Stay current with your immunizations.  Do not use any tobacco products including cigarettes, chewing tobacco, or electronic cigarettes.  If you are pregnant, do not drink alcohol.  If you are  breastfeeding, limit how much and how often you drink alcohol.  Limit alcohol intake to no more than 1 drink per day for nonpregnant women. One drink equals 12 ounces of beer, 5 ounces of wine, or 1 ounces of hard liquor.  Do not use street drugs.  Do not share needles.  Ask your health care provider for help if you need support or information about quitting drugs.  Tell your health care provider if you often feel depressed.  Tell your health care provider if you have ever been abused or do not feel safe at home. This information is not intended to replace advice given to you by your health care provider. Make sure you discuss any questions you have with your health care provider. Document Released: 08/10/2010 Document Revised: 07/03/2015 Document Reviewed: 10/29/2014 Elsevier Interactive Patient Education  2018 [place]  Dear Ms. Hillock  We are excited to introduce MyChart, a new best-in-class service that provides you online access to important information in your electronic medical record. We want to make it easier for you to view your health information - all in one secure location - when and where you need it. We expect MyChart will enhance the quality of care and service we provide. Use the activation code below to enroll in MyChart online at [email or web address]  When you register for MyChart, you can:  Marland Kitchen View your test results. . Communicate securely with your physician's office.  . View your medical history, allergies, medications, and immunizations. . Conveniently print information such as your medication lists.  If you are age 32 or older and want a member of your family to have access to your record, you must provide written consent by completing a proxy form available at our facility. Please speak to our clinical staff about guidelines regarding accounts for patients younger than age 51.  As you activate your MyChart account and need  any technical assistance, please call the MyChart technical support line at [phone]) [phone]-CHART [phone]).  Thank you for using MyChart as your new health and  wellness resource!  MyChart Activation Code:  [email or web address] Expires: 08/22/2017  2:54 PM           [facility]  [address] [place], [place] [address]

## 2017-07-11 ENCOUNTER — Encounter: Payer: Self-pay | Admitting: Family Medicine

## 2017-07-11 ENCOUNTER — Ambulatory Visit (INDEPENDENT_AMBULATORY_CARE_PROVIDER_SITE_OTHER): Payer: Medicare Other | Admitting: Family Medicine

## 2017-07-11 ENCOUNTER — Other Ambulatory Visit: Payer: Self-pay

## 2017-07-11 VITALS — BP 142/90 | HR 91 | Temp 97.8°F | Ht 62.0 in | Wt 159.2 lb

## 2017-07-11 DIAGNOSIS — M159 Polyosteoarthritis, unspecified: Secondary | ICD-10-CM

## 2017-07-11 DIAGNOSIS — M7581 Other shoulder lesions, right shoulder: Secondary | ICD-10-CM | POA: Diagnosis not present

## 2017-07-11 DIAGNOSIS — F329 Major depressive disorder, single episode, unspecified: Secondary | ICD-10-CM

## 2017-07-11 DIAGNOSIS — I10 Essential (primary) hypertension: Secondary | ICD-10-CM | POA: Diagnosis not present

## 2017-07-11 DIAGNOSIS — M150 Primary generalized (osteo)arthritis: Secondary | ICD-10-CM

## 2017-07-11 MED ORDER — DICLOFENAC SODIUM 1 % TD GEL: 2.0000 g | Freq: Four times a day (QID) | TRANSDERMAL | 2 refills | Status: DC | PRN

## 2017-07-11 NOTE — Progress Notes (Signed)
Subjective  CC:  Chief Complaint  Patient presents with  . Depression    Doing well, patient states that she hasnt really noticed a difference on or off medication, wants to discuss weaning off medication   . Arm Pain    patient states pain in both L & R upper arms    HPI: Nicole Jimenez is a 83 y.o. female who presents to the office today to address the problems listed above in the chief complaint, mood problems.  I have also reviewed her wellness visit from last week.  Her mood continues to do well.  She is not certain that she continues to need to be antidepressant.  She has no adverse effects.  We reviewed together the last 6 months noting that she came in depressed and was having somatic complaints.  Her mood improved on the medication and her somatic symptoms resolved.  She is doing much better and has no side effects from the medications. Depression screen [facility] 2/9 07/08/2017 03/03/2017 01/24/2017  Decreased Interest 0 1 0  Down, Depressed, Hopeless 1 1 0  PHQ - 2 Score 1 2 0  Altered sleeping 0 1 -  Tired, decreased energy 1 2 -  Change in appetite 0 1 -  Feeling bad or failure about yourself  1 0 -  Trouble concentrating 0 1 -  Moving slowly or fidgety/restless 0 1 -  Suicidal thoughts 0 0 -  PHQ-9 Score 3 8 -  Difficult doing work/chores Not difficult at all Somewhat difficult -    Bilateral upper arm pain/shoulder pain.  Worse while lying on the right side.  Right worse than left.  No injury or overuse.  Does have some mild pain with reaching overhead.  No weakness.  No neck pain.  No paresthesias.  Pain first joint of left thumb.  Using Tylenol without significant improvement in pain.  She is on a blood thinner so hesitates to use NSAIDs.  No locking.   Assessment  1. Major depression, chronic   2. Right rotator cuff tendinitis   3. Essential hypertension   4. Primary osteoarthritis involving multiple joints      Plan   Depression: Now in remission.  Educated  on need to continue medications to maintain her mood.  Will consider weaning after 9 to 12 months.  Patient agrees with this plan.  Reviewed concept of mood problems caused by biochemical imbalance of neurotransmitters and rationale for treatment with medications and therapy.   Counseling given: pt was instructed to contact office, on-call physician or crisis Hotline if symptoms worsen significantly. If patient develops any suicidal or homicidal thoughts, she is directed to the ER immediately.   Shoulder pain, rotator cuff tendinopathy likely.  Start icing twice a day.  Trial of diclofenac gel as needed.  Blood pressure with borderline control but given her age and comorbidities and current medications we will leave as is.  Recheck in 3 to 6 months after decreasing sodium in the diet.  Will consider adjusting antihypertensives at that time if it remains elevated.   Osteoarthritis in joints, thumb pain.  Recommend Tylenol.  Can also try the diclofenac gel  Follow up: Return in about 6 months (around 01/10/2018) for follow up Hypertension.  No orders of the defined types were placed in this encounter.  Meds ordered this encounter  Medications  . diclofenac sodium (VOLTAREN) 1 % GEL    Sig: Apply 2 g topically 4 (four) times daily as needed.  Dispense:  200 g    Refill:  2      I reviewed the patients updated PMH, FH, and SocHx.    Patient Active Problem List   Diagnosis Date Noted  . Diastolic CHF ([place]) 40/81/4481    Priority: High  . Diet-controlled diabetes mellitus ([place]) 01/24/2017    Priority: High  . Subclinical hyperthyroidism 01/21/2014    Priority: High  . Essential hypertension 02/23/2013    Priority: High  . Hypercholesterolemia 02/23/2013    Priority: High  . DJD of right shoulder 01/24/2017    Priority: Medium  . Bereavement due to life event 03/05/2016    Priority: Medium  . Osteopenia 08/23/2015    Priority: Medium  . Idiopathic scoliosis of thoracic spine  07/09/2014    Priority: Medium  . DDD (degenerative disc disease), lumbosacral 07/15/2012    Priority: Medium  . Lumbosacral spondylosis 07/15/2012    Priority: Medium  . Osteoarthritis of knee 07/15/2012    Priority: Medium  . Bilateral edema of lower extremity 01/26/2017    Priority: Low  . Bilateral hip bursitis 07/17/2015    Priority: Low  . Age-related nuclear cataract of both eyes 03/11/2015    Priority: Low  . Insomnia 09/04/2013    Priority: Low  . Sjogren's syndrome ([place]) 08/04/2012    Priority: Low  . Allergic rhinitis 07/15/2012    Priority: Low  . Major depression, chronic 04/14/2017  . Embolus of femoral artery ([place]) 01/26/2017   Current Meds  Medication Sig  . Acetaminophen 500 MG coapsule Take 1,000 mg by mouth as needed.   . ACIDOPHILUS LACTOBACILLUS PO Take 1 each by mouth daily.   Marland Kitchen aspirin 81 MG tablet Take 81 mg by mouth.  Marland Kitchen atenolol (TENORMIN) 50 MG tablet Take 50 mg by mouth daily.  . Calcium Carb-Cholecalciferol 600-800 MG-UNIT CHEW Chew 1 each by mouth daily.   Marland Kitchen diltiazem (CARDIZEM CD) 180 MG 24 hr capsule TAKE ONE CAPSULE BY MOUTH IN THE MORNING  . ELIQUIS 5 MG TABS tablet TAKE 1 TABLET BY MOUTH TWICE A DAY  . Glucosamine-MSM-Hyaluronic Acd (JOINT HEALTH PO) Take 1 tablet by mouth daily.  Marland Kitchen glucose blood (ONE TOUCH ULTRA TEST) test strip USE TWICE DAILY TO CHECK BLOOD SUGAR - E11.40  . hydrochlorothiazide (HYDRODIURIL) 25 MG tablet Take 1 tablet (25 mg total) by mouth daily.  Javier Docker Oil 500 MG CAPS Take 500 mg by mouth daily.  . Multiple Vitamins-Minerals (MULTIVITAMIN ADULT PO) Take by mouth.  Marland Kitchen omeprazole (PRILOSEC) 20 MG capsule TAKE ONE CAPSULE BY MOUTH EVERY MORNING 30 MINUTES BEFORE BREAKFAST.  Marland Kitchen pravastatin (PRAVACHOL) 80 MG tablet TAKE 1 TABLET BY MOUTH EVERY DAY  . vortioxetine HBr (TRINTELLIX) 10 MG TABS Take 1 tablet (10 mg total) by mouth daily.    Allergies: Patient is allergic to amoxicillin-pot clavulanate; amoxicillin; and  clarithromycin. Family history:  Patient family history includes Arthritis in her mother and sister; Diabetes in her father and sister; Healthy in her daughter, son, and son; Heart attack in her father and mother; Hyperlipidemia in her father; Other in her son. Social History   Socioeconomic History  . Marital status: Widowed    Spouse name: Not on file  . Number of children: Not on file  . Years of education: Not on file  . Highest education level: Not on file  Occupational History  . Occupation: retired Arts administrator  . Financial resource strain: Not on file  . Food insecurity:  Worry: Not on file    Inability: Not on file  . Transportation needs:    Medical: Not on file    Non-medical: Not on file  Tobacco Use  . Smoking status: Never Smoker  . Smokeless tobacco: Never Used  Substance and Sexual Activity  . Alcohol use: No    Frequency: Never  . Drug use: No  . Sexual activity: Never  Lifestyle  . Physical activity:    Days per week: Not on file    Minutes per session: Not on file  . Stress: Not on file  Relationships  . Social connections:    Talks on phone: Not on file    Gets together: Not on file    Attends religious service: Not on file    Active member of club or organization: Not on file    Attends meetings of clubs or organizations: Not on file    Relationship status: Not on file  Other Topics Concern  . Not on file  Social History Narrative  . Not on file     Review of Systems: Constitutional: Negative for fever malaise or anorexia Cardiovascular: negative for chest pain Respiratory: negative for SOB or persistent cough Gastrointestinal: negative for abdominal pain BP Readings from Last 3 Encounters:  07/11/17 (!) 142/90  07/08/17 (!) 142/86  04/14/17 (!) 142/82   Objective  Vitals: BP (!) 142/90   Pulse 91   Temp 97.8 F (36.6 C)   Ht 5\' 2"  (1.575 m)   Wt 159 lb 3.2 oz (72.2 kg)   BMI 29.12 kg/m  General: no acute distress,  well appearing, no apparent distress, well groomed Psych:  Alert and oriented x 3,normal mood, behavior, speech, dress, and thought processes.  Cardiovascular:  RRR without murmur or gallop. no peripheral edema Respiratory:  Good breath sounds bilaterally, CTAB with normal respiratory effort Skin:  Warm, no rashes Bilateral shoulders with full range of motion mild tenderness with abduction past 90 degrees.  No weakness.  Mild empty can testing positive on right.  Normal grip strength.    Commons side effects, risks, benefits, and alternatives for medications and treatment plan prescribed today were discussed, and the patient expressed understanding of the given instructions. Patient is instructed to call or message via MyChart if he/she has any questions or concerns regarding our treatment plan. No barriers to understanding were identified. We discussed Red Flag symptoms and signs in detail. Patient expressed understanding regarding what to do in case of urgent or emergency type symptoms.   Medication list was reconciled, printed and provided to the patient in AVS. Patient instructions and summary information was reviewed with the patient as documented in the AVS. This note was prepared with assistance of Dragon voice recognition software. Occasional wrong-word or sound-a-like substitutions may have occurred due to the inherent limitations of voice recognition software

## 2017-07-11 NOTE — Progress Notes (Signed)
I have reviewed the documentation from the recent AWV done by Kim Broome; I agree with the documentation and will follow up on any recommendations or abnormal findings as suggested.  

## 2017-07-11 NOTE — Patient Instructions (Addendum)
Please make an appointment to follow-up with me in 6 months for hypertension.   ICE Shoulders 2 times daily.  Use the Volteran gel 2-4 times daily as needed.  Continue Mood medications.     Rotator Cuff Tendinitis Rotator cuff tendinitis is inflammation of the tough, cord-like bands that connect muscle to bone (tendons) in the rotator cuff. The rotator cuff includes all of the muscles and tendons that connect the arm to the shoulder. The rotator cuff holds the head of the upper arm bone (humerus) in the cup (fossa) of the shoulder blade (scapula). This condition can lead to a long-lasting (chronic) tear. The tear may be partial or complete. What are the causes? This condition is usually caused by overusing the rotator cuff. What increases the risk? This condition is more likely to develop in athletes and workers who frequently use their shoulder or reach over their heads. This can include activities such as:  Tennis.  Baseball or softball.  Swimming.  Construction work.  Painting.  What are the signs or symptoms? Symptoms of this condition include:  Pain spreading (radiating) from the shoulder to the upper arm.  Swelling and tenderness in front of the shoulder.  Pain when reaching, pulling, or lifting the arm above the head.  Pain when lowering the arm from above the head.  Minor pain in the shoulder when resting.  Increased pain in the shoulder at night.  Difficulty placing the arm behind the back.  How is this diagnosed? This condition is diagnosed with a medical history and physical exam. Tests may also be done, including:  X-rays.  MRI.  Ultrasounds.  CT or MR arthrogram. During this test, a contrast material is injected and then images are taken.  How is this treated? Treatment for this condition depends on the severity of the condition. In less severe cases, treatment may include:  Rest. This may be done with a sling that holds the shoulder still  (immobilization). Your health care provider may also recommend avoiding activities that involve lifting your arm over your head.  Icing the shoulder.  Anti-inflammatory medicines, such as aspirin or ibuprofen.  In more severe cases, treatment may include:  Physical therapy.  Steroid injections.  Surgery.  Follow these instructions at home: If you have a sling:  Wear the sling as told by your health care provider. Remove it only as told by your health care provider.  Loosen the sling if your fingers tingle, become numb, or turn cold and blue.  Keep the sling clean.  If the sling is not waterproof, do not let it get wet. Remove it, if allowed, or cover it with a watertight covering when you take a bath or shower. Managing pain, stiffness, and swelling  If directed, put ice on the injured area. ? If you have a removable sling, remove it as told by your health care provider. ? Put ice in a plastic bag. ? Place a towel between your skin and the bag. ? Leave the ice on for 20 minutes, 2-3 times a day.  Move your fingers often to avoid stiffness and to lessen swelling.  Raise (elevate) the injured area above the level of your heart while you are lying down.  Find a comfortable sleeping position or sleep on a recliner, if available. Driving  Do not drive or use heavy machinery while taking prescription pain medicine.  Ask your health care provider when it is safe to drive if you have a sling on your arm. Activity  Rest your shoulder as told by your health care provider.  Return to your normal activities as told by your health care provider. Ask your health care provider what activities are safe for you.  Do any exercises or stretches as told by your health care provider.  If you do repetitive overhead tasks, take small breaks in between and include stretching exercises as told by your health care provider. General instructions  Do not use any products that contain  nicotine or tobacco, such as cigarettes and e-cigarettes. These can delay healing. If you need help quitting, ask your health care provider.  Take over-the-counter and prescription medicines only as told by your health care provider.  Keep all follow-up visits as told by your health care provider. This is important. Contact a health care provider if:  Your pain gets worse.  You have new pain in your arm, hands, or fingers.  Your pain is not relieved with medicine or does not get better after 6 weeks of treatment.  You have cracking sensations when moving your shoulder in certain directions.  You hear a snapping sound after using your shoulder, followed by severe pain and weakness. Get help right away if:  Your arm, hand, or fingers are numb or tingling.  Your arm, hand, or fingers are swollen or painful or they turn white or blue. Summary  Rotator cuff tendinitis is inflammation of the tough, cord-like bands that connect muscle to bone (tendons) in the rotator cuff.  This condition is usually caused by overusing the rotator cuff, which includes all of the muscles and tendons that connect the arm to the shoulder.  This condition is more likely to develop in athletes and workers who frequently use their shoulder or reach over their heads.  Treatment generally includes rest, anti-inflammatory medicines, and icing. In some cases, physical therapy and steroid injections may be needed. In severe cases, surgery may be needed. This information is not intended to replace advice given to you by your health care provider. Make sure you discuss any questions you have with your health care provider. Document Released: 04/17/2003 Document Revised: 01/12/2016 Document Reviewed: 01/12/2016 Elsevier Interactive Patient Education  2017 [employer].

## 2017-07-13 ENCOUNTER — Telehealth: Payer: Self-pay | Admitting: Family Medicine

## 2017-07-13 ENCOUNTER — Encounter: Payer: Self-pay | Admitting: *Deleted

## 2017-07-13 NOTE — Telephone Encounter (Signed)
Started PA on [employer] today.  Called patient and left a detailed voice message to let he know the process has been started and hopefully we can let her know something by this week or early next week.

## 2017-07-13 NOTE — Telephone Encounter (Signed)
Copied from [place] [phone]. Topic: Quick Communication - See Telephone Encounter >> Jul 13, 2017 11:34 AM Percell Belt A wrote: CRM for notification. See Telephone encounter for: 07/13/17.  Pt called and stated that [facility] in [place] should have sent over a PA on the diclofenac sodium (VOLTAREN) 1 % GEL [[id number]].  Was this rec'd?

## 2017-07-13 NOTE — Telephone Encounter (Signed)
Received a fax from [employer] stating that they are denying the PA for Voltaren Gel.  Please advise if you want to recommend a different medication or if you want to proceed with an appeal.

## 2017-07-14 ENCOUNTER — Telehealth: Payer: Self-pay

## 2017-07-14 DIAGNOSIS — Z48812 Encounter for surgical aftercare following surgery on the circulatory system: Secondary | ICD-10-CM | POA: Diagnosis not present

## 2017-07-14 DIAGNOSIS — I739 Peripheral vascular disease, unspecified: Secondary | ICD-10-CM | POA: Diagnosis not present

## 2017-07-14 NOTE — Telephone Encounter (Signed)
Error

## 2017-07-14 NOTE — Telephone Encounter (Signed)
Please appeal; on chronic blood thinners, can't use nsaids; high fall risk so no ultram/narcotics. Failed tylenol.

## 2017-07-14 NOTE — Telephone Encounter (Signed)
Please see previous message. Please advise.

## 2017-07-15 NOTE — Telephone Encounter (Signed)
Appeal faxed. Waiting on response.

## 2017-07-15 NOTE — Telephone Encounter (Signed)
Received a fax from [place] asking additional information. Faxed and received confirmation.

## 2017-07-21 DIAGNOSIS — Z48812 Encounter for surgical aftercare following surgery on the circulatory system: Secondary | ICD-10-CM | POA: Diagnosis not present

## 2017-07-21 DIAGNOSIS — I739 Peripheral vascular disease, unspecified: Secondary | ICD-10-CM | POA: Diagnosis not present

## 2017-07-21 DIAGNOSIS — M7989 Other specified soft tissue disorders: Secondary | ICD-10-CM | POA: Diagnosis not present

## 2017-07-25 ENCOUNTER — Other Ambulatory Visit: Payer: Self-pay | Admitting: *Deleted

## 2017-07-25 DIAGNOSIS — M150 Primary generalized (osteo)arthritis: Secondary | ICD-10-CM

## 2017-07-25 DIAGNOSIS — E2839 Other primary ovarian failure: Secondary | ICD-10-CM

## 2017-07-25 DIAGNOSIS — M159 Polyosteoarthritis, unspecified: Secondary | ICD-10-CM

## 2017-07-25 DIAGNOSIS — M8580 Other specified disorders of bone density and structure, unspecified site: Secondary | ICD-10-CM

## 2017-07-29 DIAGNOSIS — Z6829 Body mass index (BMI) 29.0-29.9, adult: Secondary | ICD-10-CM | POA: Diagnosis not present

## 2017-07-29 DIAGNOSIS — E663 Overweight: Secondary | ICD-10-CM | POA: Diagnosis not present

## 2017-07-29 DIAGNOSIS — E119 Type 2 diabetes mellitus without complications: Secondary | ICD-10-CM | POA: Diagnosis not present

## 2017-07-29 DIAGNOSIS — E559 Vitamin D deficiency, unspecified: Secondary | ICD-10-CM | POA: Diagnosis not present

## 2017-07-29 DIAGNOSIS — Z7901 Long term (current) use of anticoagulants: Secondary | ICD-10-CM | POA: Diagnosis not present

## 2017-07-29 DIAGNOSIS — E1129 Type 2 diabetes mellitus with other diabetic kidney complication: Secondary | ICD-10-CM | POA: Diagnosis not present

## 2017-07-29 DIAGNOSIS — Z86718 Personal history of other venous thrombosis and embolism: Secondary | ICD-10-CM | POA: Diagnosis not present

## 2017-07-29 DIAGNOSIS — E0590 Thyrotoxicosis, unspecified without thyrotoxic crisis or storm: Secondary | ICD-10-CM | POA: Diagnosis not present

## 2017-07-29 DIAGNOSIS — R809 Proteinuria, unspecified: Secondary | ICD-10-CM | POA: Diagnosis not present

## 2017-07-29 DIAGNOSIS — R600 Localized edema: Secondary | ICD-10-CM | POA: Diagnosis not present

## 2017-08-04 ENCOUNTER — Telehealth: Payer: Self-pay

## 2017-08-04 ENCOUNTER — Other Ambulatory Visit: Payer: Self-pay

## 2017-08-04 DIAGNOSIS — M150 Primary generalized (osteo)arthritis: Secondary | ICD-10-CM

## 2017-08-04 DIAGNOSIS — M159 Polyosteoarthritis, unspecified: Secondary | ICD-10-CM

## 2017-08-04 DIAGNOSIS — M8580 Other specified disorders of bone density and structure, unspecified site: Secondary | ICD-10-CM

## 2017-08-04 DIAGNOSIS — E2839 Other primary ovarian failure: Secondary | ICD-10-CM

## 2017-08-04 NOTE — Telephone Encounter (Signed)
Order for Dexa Scan was put in on 07/25/2017. Called patient and she stated that this order was not able to be seen from [facility] and they called her this morning and let her know that she needed to call us and have [place] put in a new order for her.   Updated Dexa Scan has been ordered.  Copied from [place] [phone]. Topic: Referral - Request >> Aug 04, 2017  1:20 PM Synthia Innocent wrote: Reason for CRM: Patient states she is due for bone density and is requesting order be sent to [facility]

## 2017-08-22 ENCOUNTER — Telehealth: Payer: Self-pay | Admitting: Family Medicine

## 2017-08-22 NOTE — Telephone Encounter (Signed)
Copied from [place] [id number]. Topic: Quick Communication - Rx Refill/Question >> Aug 22, 2017  3:47 PM Waldemar Dickens, Sade R wrote: Medication: atenolol (TENORMIN) 50 MG tablet  Has the patient contacted their pharmacy? Yes Preferred Pharmacy (with phone number or street name):   Agent: Please be advised that RX refills may take up to 3 business days. We ask that you follow-up with your pharmacy.

## 2017-08-23 MED ORDER — ATENOLOL 50 MG PO TABS: 50.0000 mg | ORAL_TABLET | Freq: Every day | ORAL | 3 refills | Status: DC

## 2017-08-23 NOTE — Telephone Encounter (Signed)
Incoming call from patient requesting refill on Tenormin  50mg .    LOV 07/21/17 with Billey Chang MD     Last refill Not listed.    [phone].

## 2017-08-23 NOTE — Addendum Note (Signed)
Addended bySigurd Sos on: 08/23/2017 10:45 AM   Modules accepted: Orders

## 2017-09-06 ENCOUNTER — Other Ambulatory Visit: Payer: Self-pay | Admitting: Emergency Medicine

## 2017-09-06 ENCOUNTER — Telehealth: Payer: Self-pay | Admitting: Emergency Medicine

## 2017-09-06 ENCOUNTER — Other Ambulatory Visit: Payer: Self-pay | Admitting: Family Medicine

## 2017-09-06 DIAGNOSIS — M8580 Other specified disorders of bone density and structure, unspecified site: Secondary | ICD-10-CM

## 2017-09-06 DIAGNOSIS — E2839 Other primary ovarian failure: Secondary | ICD-10-CM

## 2017-09-06 NOTE — Telephone Encounter (Signed)
Spoke with patient about Dexa scan. Her last Dexa scan was 08/19/2015. Recheck every 2 yrs. Will make sure [facility] and scheduled.  Copied from [place] [phone]. Topic: General - Other >> Sep 06, 2017  2:08 PM Nicole Jimenez R wrote: Pt called in and stated she needs to know information about bone density test if she needs to schedule or if Dr Jonni Sanger is going to schedule.

## 2017-10-19 ENCOUNTER — Telehealth: Payer: Self-pay | Admitting: Family Medicine

## 2017-10-19 NOTE — Telephone Encounter (Signed)
Did you call patient?

## 2017-10-19 NOTE — Telephone Encounter (Signed)
Copied from [place] [phone]. Topic: Referral - Request >> Oct 19, 2017  3:24 PM Cecelia Byars, NT wrote: Reason for CRM: Patient called said she was left a message today that was too fast the person that called was speaking too softly and too fast , she is asking if a message is left to please do so at a slower pace She is unsure of details and would like a call back and also would like information concerning her  referral for a bone density and would like the location please call her to discuss at [phone]

## 2017-10-20 NOTE — Telephone Encounter (Signed)
I have not called this patient. [name] ordered the Bone Density Scan. Did you call this patient? Please see message.

## 2017-10-20 NOTE — Telephone Encounter (Signed)
Did you call this patient regarding a referral   Please advise.   Doloris Hall,  LPN

## 2017-10-20 NOTE — Telephone Encounter (Signed)
I called pt asking if she still wanted to have the Bone Density scan done that we ordered for her.

## 2017-10-29 ENCOUNTER — Other Ambulatory Visit: Payer: Self-pay | Admitting: Family Medicine

## 2017-11-03 DIAGNOSIS — H43813 Vitreous degeneration, bilateral: Secondary | ICD-10-CM | POA: Diagnosis not present

## 2017-11-03 DIAGNOSIS — H25042 Posterior subcapsular polar age-related cataract, left eye: Secondary | ICD-10-CM | POA: Diagnosis not present

## 2017-11-03 DIAGNOSIS — H25013 Cortical age-related cataract, bilateral: Secondary | ICD-10-CM | POA: Diagnosis not present

## 2017-11-03 DIAGNOSIS — E119 Type 2 diabetes mellitus without complications: Secondary | ICD-10-CM | POA: Diagnosis not present

## 2017-11-03 DIAGNOSIS — H52203 Unspecified astigmatism, bilateral: Secondary | ICD-10-CM | POA: Diagnosis not present

## 2017-11-03 DIAGNOSIS — H43393 Other vitreous opacities, bilateral: Secondary | ICD-10-CM | POA: Diagnosis not present

## 2017-11-03 DIAGNOSIS — H35371 Puckering of macula, right eye: Secondary | ICD-10-CM | POA: Diagnosis not present

## 2017-11-03 DIAGNOSIS — H04123 Dry eye syndrome of bilateral lacrimal glands: Secondary | ICD-10-CM | POA: Diagnosis not present

## 2017-11-03 DIAGNOSIS — H2513 Age-related nuclear cataract, bilateral: Secondary | ICD-10-CM | POA: Diagnosis not present

## 2017-11-03 DIAGNOSIS — H524 Presbyopia: Secondary | ICD-10-CM | POA: Diagnosis not present

## 2017-11-03 DIAGNOSIS — H5213 Myopia, bilateral: Secondary | ICD-10-CM | POA: Diagnosis not present

## 2017-11-16 ENCOUNTER — Telehealth: Payer: Self-pay | Admitting: Emergency Medicine

## 2017-11-16 NOTE — Telephone Encounter (Signed)
Copied from [place] [phone]. Topic: Inquiry >> Nov 16, 2017 10:57 AM Ahmed Prima L wrote: Reason for CRM: Patient would like to know does she need to fast for her blood work on Friday, please advise. Leave a detailed message on her voicemail.

## 2017-11-17 DIAGNOSIS — H25013 Cortical age-related cataract, bilateral: Secondary | ICD-10-CM | POA: Diagnosis not present

## 2017-11-17 DIAGNOSIS — H35371 Puckering of macula, right eye: Secondary | ICD-10-CM | POA: Diagnosis not present

## 2017-11-17 DIAGNOSIS — H524 Presbyopia: Secondary | ICD-10-CM | POA: Diagnosis not present

## 2017-11-17 DIAGNOSIS — H25011 Cortical age-related cataract, right eye: Secondary | ICD-10-CM | POA: Diagnosis not present

## 2017-11-17 DIAGNOSIS — H2511 Age-related nuclear cataract, right eye: Secondary | ICD-10-CM | POA: Diagnosis not present

## 2017-11-17 DIAGNOSIS — H5213 Myopia, bilateral: Secondary | ICD-10-CM | POA: Diagnosis not present

## 2017-11-17 DIAGNOSIS — H2513 Age-related nuclear cataract, bilateral: Secondary | ICD-10-CM | POA: Diagnosis not present

## 2017-11-17 DIAGNOSIS — H52203 Unspecified astigmatism, bilateral: Secondary | ICD-10-CM | POA: Diagnosis not present

## 2017-11-17 NOTE — Telephone Encounter (Signed)
Patient is returning phone call. She will be home until 1130

## 2017-11-17 NOTE — Telephone Encounter (Signed)
LM to [facility]   Doloris Hall,  LPN

## 2017-11-17 NOTE — Telephone Encounter (Signed)
LM informing patient that if she would like to fast that is fine, but no problem if she cannot.   Doloris Hall,  LPN

## 2017-11-18 ENCOUNTER — Other Ambulatory Visit: Payer: Self-pay

## 2017-11-18 ENCOUNTER — Encounter: Payer: Self-pay | Admitting: Family Medicine

## 2017-11-18 ENCOUNTER — Ambulatory Visit (INDEPENDENT_AMBULATORY_CARE_PROVIDER_SITE_OTHER): Payer: Medicare Other | Admitting: Family Medicine

## 2017-11-18 VITALS — BP 130/84 | HR 79 | Temp 97.9°F | Ht 62.0 in | Wt 163.4 lb

## 2017-11-18 DIAGNOSIS — I10 Essential (primary) hypertension: Secondary | ICD-10-CM

## 2017-11-18 DIAGNOSIS — M5137 Other intervertebral disc degeneration, lumbosacral region: Secondary | ICD-10-CM

## 2017-11-18 DIAGNOSIS — Z23 Encounter for immunization: Secondary | ICD-10-CM

## 2017-11-18 DIAGNOSIS — M7062 Trochanteric bursitis, left hip: Secondary | ICD-10-CM

## 2017-11-18 DIAGNOSIS — I4819 Other persistent atrial fibrillation: Secondary | ICD-10-CM

## 2017-11-18 DIAGNOSIS — E119 Type 2 diabetes mellitus without complications: Secondary | ICD-10-CM

## 2017-11-18 DIAGNOSIS — I499 Cardiac arrhythmia, unspecified: Secondary | ICD-10-CM | POA: Diagnosis not present

## 2017-11-18 LAB — COMPREHENSIVE METABOLIC PANEL
ALBUMIN: 4.2 g/dL (ref 3.5–5.2)
ALT: 21 U/L (ref 0–35)
AST: 23 U/L (ref 0–37)
Alkaline Phosphatase: 79 U/L (ref 39–117)
BUN: 19 mg/dL (ref 6–23)
CALCIUM: 9.4 mg/dL (ref 8.4–10.5)
CO2: 29 mEq/L (ref 19–32)
CREATININE: 0.81 mg/dL (ref 0.40–1.20)
Chloride: 101 mEq/L (ref 96–112)
GFR: 72.14 mL/min (ref >60.00)
Glucose, Bld: 131 mg/dL — ABNORMAL HIGH (ref 70–99)
Potassium: 4 mEq/L (ref 3.5–5.1)
Sodium: 140 mEq/L (ref 135–145)
TOTAL PROTEIN: 7.7 g/dL (ref 6.0–8.3)
Total Bilirubin: 0.8 mg/dL (ref 0.2–1.2)

## 2017-11-18 LAB — CBC WITH DIFFERENTIAL/PLATELET
BASOS ABS: 0.1 10*3/uL (ref 0.0–0.1)
BASOS PCT: 1.3 % (ref 0.0–3.0)
EOS ABS: 0.2 10*3/uL (ref 0.0–0.7)
Eosinophils Relative: 3 % (ref 0.0–5.0)
HEMATOCRIT: 44.1 % (ref 36.0–46.0)
HEMOGLOBIN: 14.8 g/dL (ref 12.0–15.0)
LYMPHS PCT: 27.6 % (ref 12.0–46.0)
Lymphs Abs: 1.4 10*3/uL (ref 0.7–4.0)
MCHC: 33.6 g/dL (ref 30.0–36.0)
MCV: 88.1 fl (ref 78.0–100.0)
Monocytes Absolute: 0.3 10*3/uL (ref 0.1–1.0)
Monocytes Relative: 6.7 % (ref 3.0–12.0)
Neutro Abs: 3.1 10*3/uL (ref 1.4–7.7)
Neutrophils Relative %: 61.4 % (ref 43.0–77.0)
Platelets: 166 10*3/uL (ref 150.0–400.0)
RBC: 5 Mil/uL (ref 3.87–5.11)
RDW: 14.1 % (ref 11.5–15.5)
WBC: 5 10*3/uL (ref 4.0–10.5)

## 2017-11-18 LAB — LIPID PANEL
CHOLESTEROL: 211 mg/dL — AB (ref 0–200)
HDL: 70 mg/dL (ref >39.00)
LDL Cholesterol: 114 mg/dL — ABNORMAL HIGH (ref 0–99)
NonHDL: 140.79
TRIGLYCERIDES: 134 mg/dL (ref 0.0–149.0)
Total CHOL/HDL Ratio: 3
VLDL: 26.8 mg/dL (ref 0.0–40.0)

## 2017-11-18 LAB — POCT GLYCOSYLATED HEMOGLOBIN (HGB A1C): Hemoglobin A1C: 6.5 % — AB (ref 4.0–5.6)

## 2017-11-18 LAB — TSH: TSH: 0.64 u[IU]/mL (ref 0.35–4.50)

## 2017-11-18 LAB — MICROALBUMIN / CREATININE URINE RATIO
CREATININE, U: 31.8 mg/dL
MICROALB UR: 5.6 mg/dL — AB (ref 0.0–1.9)
MICROALB/CREAT RATIO: 17.5 mg/g (ref 0.0–30.0)

## 2017-11-18 MED ORDER — APIXABAN 2.5 MG PO TABS: 2.5000 mg | ORAL_TABLET | Freq: Two times a day (BID) | ORAL | 5 refills | Status: DC

## 2017-11-18 NOTE — Progress Notes (Signed)
Subjective  CC:  Chief Complaint  Patient presents with  . Hypertension  . Diabetes    last a1c 01/2017  . Back Pain    left sided back pain     HPI: Nicole Jimenez is a 83 y.o. female who presents to the office today to address the problems listed above in the chief complaint.  Hypertension f/u: Control is good . Pt reports she is doing well. taking medications as instructed, no medication side effects noted, no TIAs, no chest pain on exertion, no dyspnea on exertion, no swelling of ankles. She denies palpitations or lightheadedness She denies adverse effects from his BP medications. Compliance with medication is good.   Diabetes follow up: Her diabetic control is reported as Unchanged. Diet controlled. No sxs of hyperglycemia.  She denies exertional CP or SOB or symptomatic hypoglycemia. She denies foot sores or paresthesias.  C/o left low back pain x 2-3 days. Worse with certain mvts and difficult to get comfortable to sleep. No radicular sxs, le weakness, leg pain or b/b dysfunction. Has h/o djd lumbar spine. Took a tylenol which helped. No trauma  Immunization History  Administered Date(s) Administered  . Influenza Split 01/24/2012  . Influenza, High Dose Seasonal PF 12/02/2013, 01/20/2015, 12/28/2016  . Influenza,inj,Quad PF,6+ Mos 11/21/2015, 11/18/2017  . Influenza-Unspecified 01/24/2012, 01/05/2014, 01/05/2014, 01/20/2015  . Pneumococcal Conjugate-13 03/06/2015, 03/06/2015  . Pneumococcal Polysaccharide-23 06/18/2016  . Zoster 10/09/2012    Diabetes Related Lab Review: Lab Results  Component Value Date   HGBA1C 6.5 (A) 11/18/2017   HGBA1C 6.0 (H) 01/24/2017     Lab Results  Component Value Date   MICROALBUR 5.6 (H) 11/18/2017   Lab Results  Component Value Date   CREATININE 0.81 11/18/2017   BUN 19 11/18/2017   NA 140 11/18/2017   K 4.0 11/18/2017   CL 101 11/18/2017   CO2 29 11/18/2017   Lab Results  Component Value Date   CHOL 211 (H) 11/18/2017   CHOL 167 01/24/2017   Lab Results  Component Value Date   HDL 70.00 11/18/2017   HDL 65 01/24/2017   Lab Results  Component Value Date   LDLCALC 114 (H) 11/18/2017   LDLCALC 72 01/24/2017   Lab Results  Component Value Date   TRIG 134.0 11/18/2017   TRIG 204 (H) 01/24/2017   Lab Results  Component Value Date   CHOLHDL 3 11/18/2017   CHOLHDL 2.6 01/24/2017   No results found for: LDLDIRECT The ASCVD Risk score Mikey Bussing DC Jr., et al., 2013) failed to calculate for the following reasons:   The 2013 ASCVD risk score is only valid for ages 59 to 45  Assessment  1. Essential hypertension   2. Diet-controlled diabetes mellitus ([place])   3. DDD (degenerative disc disease), lumbosacral   4. Irregular heart rhythm   5. Trochanteric bursitis of left hip   6. Persistent atrial fibrillation   7. Need for immunization against influenza      Plan    Hypertension f/u: BP control is well controlled. Not on ARB/ace. Check urine.   Hyperlipidemia f/u: elevated. Will need a statin.  New onset afib: asymptomatic. Start eloquis low dose due to age; check renal function. Refer to cards. Educated. Rate controlled on dilt.   Dm: mildly worsening. Recheck 3 months. No meds yet.   DJD back: supportive, ice and stretches for hip bursitis. Return if worsens. Education regarding management of these chronic disease states was given. Management strategies discussed on successive visits include dietary  and exercise recommendations, goals of achieving and maintaining IBW, and lifestyle modifications aiming for adequate sleep and minimizing stressors.   Follow up: Return in about 3 months (around 02/18/2018) for complete physical.  Orders Placed This Encounter  Procedures  . Flu Vaccine QUAD 36+ mos IM  . Microalbumin / creatinine urine ratio  . CBC with Differential/Platelet  . Comprehensive metabolic panel  . Lipid panel  . TSH  . Ambulatory referral to Cardiology  . POCT glycosylated  hemoglobin (Hb A1C)  . EKG 12-Lead   Meds ordered this encounter  Medications  . apixaban (ELIQUIS) 2.5 MG TABS tablet    Sig: Take 1 tablet (2.5 mg total) by mouth 2 (two) times daily.    Dispense:  60 tablet    Refill:  5      BP Readings from Last 3 Encounters:  11/18/17 130/84  07/11/17 (!) 142/90  07/08/17 (!) 142/86   Wt Readings from Last 3 Encounters:  11/18/17 163 lb 6.4 oz (74.1 kg)  07/11/17 159 lb 3.2 oz (72.2 kg)  07/08/17 160 lb 8 oz (72.8 kg)    Lab Results  Component Value Date   CHOL 211 (H) 11/18/2017   CHOL 167 01/24/2017   Lab Results  Component Value Date   HDL 70.00 11/18/2017   HDL 65 01/24/2017   Lab Results  Component Value Date   LDLCALC 114 (H) 11/18/2017   LDLCALC 72 01/24/2017   Lab Results  Component Value Date   TRIG 134.0 11/18/2017   TRIG 204 (H) 01/24/2017   Lab Results  Component Value Date   CHOLHDL 3 11/18/2017   CHOLHDL 2.6 01/24/2017   No results found for: LDLDIRECT Lab Results  Component Value Date   CREATININE 0.81 11/18/2017   BUN 19 11/18/2017   NA 140 11/18/2017   K 4.0 11/18/2017   CL 101 11/18/2017   CO2 29 11/18/2017    The ASCVD Risk score (Goff DC Jr., et al., 2013) failed to calculate for the following reasons:   The 2013 ASCVD risk score is only valid for ages 31 to 38  I reviewed the patients updated PMH, FH, and SocHx.    Patient Active Problem List   Diagnosis Date Noted  . Diastolic CHF ([place]) 66/07/3014    Priority: High  . Diet-controlled diabetes mellitus ([place]) 01/24/2017    Priority: High  . Subclinical hyperthyroidism 01/21/2014    Priority: High  . Essential hypertension 02/23/2013    Priority: High  . Hypercholesterolemia 02/23/2013    Priority: High  . Major depression, chronic 07/18/2012    Priority: High  . DJD of right shoulder 01/24/2017    Priority: Medium  . Bereavement due to life event 03/05/2016    Priority: Medium  . Osteopenia 08/23/2015    Priority: Medium  .  Idiopathic scoliosis of thoracic spine 07/09/2014    Priority: Medium  . DDD (degenerative disc disease), lumbosacral 07/15/2012    Priority: Medium  . Lumbosacral spondylosis 07/15/2012    Priority: Medium  . Osteoarthritis of knee 07/15/2012    Priority: Medium  . Bilateral edema of lower extremity 01/26/2017    Priority: Low  . Bilateral hip bursitis 07/17/2015    Priority: Low  . Age-related nuclear cataract of both eyes 03/11/2015    Priority: Low  . Insomnia 09/04/2013    Priority: Low  . Sjogren's syndrome ([place]) 08/04/2012    Priority: Low  . Allergic rhinitis 07/15/2012    Priority: Low  . Embolus of  femoral artery ([place]) 01/26/2017    Allergies: Amoxicillin-pot clavulanate; Amoxicillin; and Clarithromycin  Social History: Patient  reports that she has never smoked. She has never used smokeless tobacco. She reports that she does not drink alcohol or use drugs.  Current Meds  Medication Sig  . Acetaminophen 500 MG coapsule Take 1,000 mg by mouth as needed.   . ACIDOPHILUS LACTOBACILLUS PO Take 1 each by mouth daily.   Marland Kitchen atenolol (TENORMIN) 50 MG tablet Take 1 tablet (50 mg total) by mouth daily.  . Calcium Carb-Cholecalciferol 600-800 MG-UNIT CHEW Chew 1 each by mouth daily.   Marland Kitchen diltiazem (CARDIZEM CD) 180 MG 24 hr capsule TAKE ONE CAPSULE BY MOUTH IN THE MORNING  . Glucosamine-MSM-Hyaluronic Acd (JOINT HEALTH PO) Take 1 tablet by mouth daily.  Marland Kitchen glucose blood (ONE TOUCH ULTRA TEST) test strip USE TWICE DAILY TO CHECK BLOOD SUGAR - E11.40  . hydrochlorothiazide (HYDRODIURIL) 25 MG tablet Take 1 tablet (25 mg total) by mouth daily.  Javier Docker Oil 500 MG CAPS Take 500 mg by mouth daily.  . Multiple Vitamins-Minerals (MULTIVITAMIN ADULT PO) Take by mouth.  Marland Kitchen omeprazole (PRILOSEC) 20 MG capsule TAKE ONE CAPSULE BY MOUTH EVERY MORNING 30 MINUTES BEFORE BREAKFAST.  Marland Kitchen pravastatin (PRAVACHOL) 80 MG tablet TAKE 1 TABLET BY MOUTH EVERY DAY  . vortioxetine HBr (TRINTELLIX) 10 MG  TABS Take 1 tablet (10 mg total) by mouth daily.  . [DISCONTINUED] aspirin 81 MG tablet Take 81 mg by mouth.  . [DISCONTINUED] ELIQUIS 5 MG TABS tablet TAKE 1 TABLET BY MOUTH TWICE A DAY    Review of Systems: Cardiovascular: negative for chest pain, palpitations, leg swelling, orthopnea Respiratory: negative for SOB, wheezing or persistent cough Gastrointestinal: negative for abdominal pain Genitourinary: negative for dysuria or gross hematuria  Objective  Vitals: BP 130/84   Pulse 79   Temp 97.9 F (36.6 C)   Ht 5\' 2"  (1.575 m)   Wt 163 lb 6.4 oz (74.1 kg)   BMI 29.89 kg/m  General: no acute distress , moves easily to table Psych:  Alert and oriented, normal mood and affect HEENT:  Normocephalic, atraumatic, supple neck  Cardiovascular:  irreg irreg w/o murmur, no peripheral edema Respiratory:  Good breath sounds bilaterally, CTAB with normal respiratory effort Skin:  Warm, no rashes Neurologic:   Mental status is normal Diabetic Foot Exam: Appearance - no lesions, ulcers or calluses Skin - no sigificant pallor or erythema Monofilament testing - sensitive bilaterally in following locations:  Right - Great toe, medial, central, lateral ball and posterior foot intact  Left - Great toe, medial, central, lateral ball and posterior foot intact Pulses - +2 distally bilaterally Back: left lumbar paravertebral ttp w/ good rom, neg SLR bilaterally.left gr troch bursa is tender. Right is not.    Commons side effects, risks, benefits, and alternatives for medications and treatment plan prescribed today were discussed, and the patient expressed understanding of the given instructions. Patient is instructed to call or message via MyChart if he/she has any questions or concerns regarding our treatment plan. No barriers to understanding were identified. We discussed Red Flag symptoms and signs in detail. Patient expressed understanding regarding what to do in case of urgent or emergency type  symptoms.   Medication list was reconciled, printed and provided to the patient in AVS. Patient instructions and summary information was reviewed with the patient as documented in the AVS. This note was prepared with assistance of Dragon voice recognition software. Occasional wrong-word or sound-a-like substitutions  may have occurred due to the inherent limitations of voice recognition software

## 2017-11-18 NOTE — Patient Instructions (Addendum)
Please return in 3 months for for your annual complete physical and follow up.  I have ordered a referral to see Dr. Rozann Lesches to discuss your new diagnosis of atrial fibrillation. He will decide if further heart testing is needed.  I have started a new blood thinner called eliquis to start taking twice a day.  Stop the aspirin therapy for now.   For your back pain:  Take Tylenol twice a day for the next 2 weeks.  Ice your left hip for the next 2-3 days, twice a day for 10 minutes.    If you have any questions or concerns, please don't hesitate to send me a message via MyChart or call the office at [phone]. Thank you for visiting with Nicole Jimenez today! It's our pleasure caring for you.   Atrial Fibrillation Atrial fibrillation is a type of heartbeat that is irregular or fast (rapid). If you have this condition, your heart keeps quivering in a weird (chaotic) way. This condition can make it so your heart cannot pump blood normally. Having this condition gives a person more risk for stroke, heart failure, and other heart problems. There are different types of atrial fibrillation. Talk with your doctor to learn about the type that you have. Follow these instructions at home:  Take over-the-counter and prescription medicines only as told by your doctor.  If your doctor prescribed a blood-thinning medicine, take it exactly as told. Taking too much of it can cause bleeding. If you do not take enough of it, you will not have the protection that you need against stroke and other problems.  Do not use any tobacco products. These include cigarettes, chewing tobacco, and e-cigarettes. If you need help quitting, ask your doctor.  If you have apnea (obstructive sleep apnea), manage it as told by your doctor.  Do not drink alcohol.  Do not drink beverages that have caffeine. These include coffee, soda, and tea.  Maintain a healthy weight. Do not use diet pills unless your doctor says they are safe for  you. Diet pills may make heart problems worse.  Follow diet instructions as told by your doctor.  Exercise regularly as told by your doctor.  Keep all follow-up visits as told by your doctor. This is important. Contact a doctor if:  You notice a change in the speed, rhythm, or strength of your heartbeat.  You are taking a blood-thinning medicine and you notice more bruising.  You get tired more easily when you move or exercise. Get help right away if:  You have pain in your chest or your belly (abdomen).  You have sweating or weakness.  You feel sick to your stomach (nauseous).  You notice blood in your throw up (vomit), poop (stool), or pee (urine).  You are short of breath.  You suddenly have swollen feet and ankles.  You feel dizzy.  Your suddenly get weak or numb in your face, arms, or legs, especially if it happens on one side of your body.  You have trouble talking, trouble understanding, or both.  Your face or your eyelid droops on one side. These symptoms may be an emergency. Do not wait to see if the symptoms will go away. Get medical help right away. Call your local emergency services (911 in the [place] Do not drive yourself to the hospital. This information is not intended to replace advice given to you by your health care provider. Make sure you discuss any questions you have with your health care provider.  Document Released: 11/04/2007 Document Revised: 07/03/2015 Document Reviewed: 05/22/2014 Elsevier Interactive Patient Education  [employer].

## 2017-12-02 ENCOUNTER — Other Ambulatory Visit: Payer: Self-pay

## 2017-12-02 ENCOUNTER — Encounter: Payer: Self-pay | Admitting: Family Medicine

## 2017-12-02 ENCOUNTER — Ambulatory Visit (INDEPENDENT_AMBULATORY_CARE_PROVIDER_SITE_OTHER): Payer: Medicare Other | Admitting: Family Medicine

## 2017-12-02 VITALS — BP 116/76 | HR 77 | Temp 98.1°F | Ht 62.0 in | Wt 163.2 lb

## 2017-12-02 DIAGNOSIS — I10 Essential (primary) hypertension: Secondary | ICD-10-CM | POA: Diagnosis not present

## 2017-12-02 DIAGNOSIS — E7800 Pure hypercholesterolemia, unspecified: Secondary | ICD-10-CM | POA: Diagnosis not present

## 2017-12-02 DIAGNOSIS — R252 Cramp and spasm: Secondary | ICD-10-CM

## 2017-12-02 DIAGNOSIS — E119 Type 2 diabetes mellitus without complications: Secondary | ICD-10-CM

## 2017-12-02 MED ORDER — LISINOPRIL-HYDROCHLOROTHIAZIDE 10-12.5 MG PO TABS: 1.0000 | ORAL_TABLET | Freq: Every day | ORAL | 3 refills | Status: DC

## 2017-12-02 MED ORDER — ROSUVASTATIN CALCIUM 20 MG PO TABS: 20.0000 mg | ORAL_TABLET | Freq: Every day | ORAL | 3 refills | Status: DC

## 2017-12-02 NOTE — Progress Notes (Signed)
Subjective  CC:  Chief Complaint  Patient presents with  . Hyperlipidemia    Wants to discuss medication option  . Foot Problem    Patient states that she has an extra layer of skin on the soles of her feet    HPI: Nicole Jimenez is a 83 y.o. female who presents to the office today for follow up of diabetes, hypertension and problems listed above in the chief complaint.   Diabetic f/u: recent + microalbunuria: here to discuss ACE.  Patient is never been on an ACE inhibitor.  She is on diltiazem and HCTZ.  She does have diastolic dysfunction.  Hypertension f/u:Control has been good    Hyperlipidemia f/u: on high dose pravachol with elevated LDL.  Has never been on other statin.  No myalgias.  Compliance is good.  Diet is fair.  Goal LDL less than 80  Complains of muscle cramping in feet at night.  Not painful.  Just noticeable.  Assessment  1. Diet-controlled diabetes mellitus ([place])   2. Essential hypertension   3. Hypercholesterolemia      Plan   Diabetes is currently very well controlled.  Add ACE inhibitor due to microalbuminuria.  Discussed indications, possible side effects of medications  Hypertension is currently very well controlled.  Will change her lisinopril HCTZ and decrease dose of HCTZ to avoid hypotension.  Recheck in January.  Follow-up if any side effects occur.  Hyperlipidemia f/u: Change to Crestor 20 mg nightly.  Will recheck lipids and increased dose if necessary to get to goal. Diabetic education: ongoing education regarding chronic disease management for diabetes was given today. We continue to reinforce the ABC's of diabetic management: A1c (<7 or 8 dependent upon patient), tight blood pressure control, and cholesterol management with goal LDL < 100 minimally. We discuss diet strategies, exercise recommendations, medication options and possible side effects. At each visit, we review recommended immunizations and preventive care recommendations for  diabetics and stress that good diabetic control can prevent other problems. See below for this patient's data. Hypertension education: ongoing education regarding management of these chronic disease states was given. Management strategies discussed on successive visits include dietary and exercise recommendations, goals of achieving and maintaining IBW, and lifestyle modifications aiming for adequate sleep and minimizing stressors.   Follow up: No follow-ups on file..  No orders of the defined types were placed in this encounter.  No orders of the defined types were placed in this encounter.     I reviewed the patients updated PMH, FH, and SocHx.  Patient Active Problem List   Diagnosis Date Noted  . Diastolic CHF ([place]) 93/71/6967    Priority: High  . Diet-controlled diabetes mellitus ([place]) 01/24/2017    Priority: High  . Subclinical hyperthyroidism 01/21/2014    Priority: High  . Essential hypertension 02/23/2013    Priority: High  . Hypercholesterolemia 02/23/2013    Priority: High  . Major depression, chronic 07/18/2012    Priority: High  . DJD of right shoulder 01/24/2017    Priority: Medium  . Bereavement due to life event 03/05/2016    Priority: Medium  . Osteopenia 08/23/2015    Priority: Medium  . Idiopathic scoliosis of thoracic spine 07/09/2014    Priority: Medium  . DDD (degenerative disc disease), lumbosacral 07/15/2012    Priority: Medium  . Lumbosacral spondylosis 07/15/2012    Priority: Medium  . Osteoarthritis of knee 07/15/2012    Priority: Medium  . Bilateral edema of lower extremity 01/26/2017  Priority: Low  . Bilateral hip bursitis 07/17/2015    Priority: Low  . Age-related nuclear cataract of both eyes 03/11/2015    Priority: Low  . Insomnia 09/04/2013    Priority: Low  . Sjogren's syndrome ([place]) 08/04/2012    Priority: Low  . Allergic rhinitis 07/15/2012    Priority: Low  . Embolus of femoral artery ([place]) 01/26/2017   Immunization History   Administered Date(s) Administered  . Influenza Split 01/24/2012  . Influenza, High Dose Seasonal PF 12/02/2013, 01/20/2015, 12/28/2016  . Influenza,inj,Quad PF,6+ Mos 11/21/2015, 11/18/2017  . Influenza-Unspecified 01/24/2012, 01/05/2014, 01/05/2014, 01/20/2015  . Pneumococcal Conjugate-13 03/06/2015, 03/06/2015  . Pneumococcal Polysaccharide-23 06/18/2016  . Zoster 10/09/2012   Health Maintenance  Topic Date Due  . DEXA SCAN  08/18/2017  . TETANUS/TDAP  01/24/2018 (Originally 12/14/1953)  . HEMOGLOBIN A1C  05/20/2018  . OPHTHALMOLOGY EXAM  11/18/2018  . FOOT EXAM  11/19/2018  . URINE MICROALBUMIN  11/19/2018  . INFLUENZA VACCINE  Completed  . PNA vac Low Risk Adult  Completed   Diabetes and HTN Related Lab Review: Lab Results  Component Value Date   HGBA1C 6.5 (A) 11/18/2017   HGBA1C 6.0 (H) 01/24/2017    Lab Results  Component Value Date   MICROALBUR 5.6 (H) 11/18/2017   Lab Results  Component Value Date   CREATININE 0.81 11/18/2017   BUN 19 11/18/2017   NA 140 11/18/2017   K 4.0 11/18/2017   CL 101 11/18/2017   CO2 29 11/18/2017   Lab Results  Component Value Date   CHOL 211 (H) 11/18/2017   CHOL 167 01/24/2017   Lab Results  Component Value Date   HDL 70.00 11/18/2017   HDL 65 01/24/2017   Lab Results  Component Value Date   LDLCALC 114 (H) 11/18/2017   LDLCALC 72 01/24/2017   Lab Results  Component Value Date   TRIG 134.0 11/18/2017   TRIG 204 (H) 01/24/2017   Lab Results  Component Value Date   CHOLHDL 3 11/18/2017   CHOLHDL 2.6 01/24/2017   No results found for: LDLDIRECT The ASCVD Risk score Mikey Bussing DC Jr., et al., 2013) failed to calculate for the following reasons:   The 2013 ASCVD risk score is only valid for ages 27 to 85  BP Readings from Last 3 Encounters:  12/02/17 116/76  11/18/17 130/84  07/11/17 (!) 142/90   Wt Readings from Last 3 Encounters:  12/02/17 163 lb 3.2 oz (74 kg)  11/18/17 163 lb 6.4 oz (74.1 kg)  07/11/17 159 lb  3.2 oz (72.2 kg)    Allergies: Patient is allergic to amoxicillin-pot clavulanate; amoxicillin; and clarithromycin. Family History: Patient family history includes Arthritis in her mother and sister; Diabetes in her father and sister; Healthy in her daughter, son, and son; Heart attack in her father and mother; Hyperlipidemia in her father; Other in her son. Social History:  Patient  reports that she has never smoked. She has never used smokeless tobacco. She reports that she does not drink alcohol or use drugs.  Review of Systems: Ophthalmic: negative for eye pain, loss of vision or double vision Cardiovascular: negative for chest pain Respiratory: negative for SOB or persistent cough Gastrointestinal: negative for abdominal pain Genitourinary: negative for dysuria or gross hematuria MSK: negative for foot lesions Neurologic: negative for weakness or gait disturbance Current Meds  Medication Sig  . Acetaminophen 500 MG coapsule Take 1,000 mg by mouth as needed.   . ACIDOPHILUS LACTOBACILLUS PO Take 1 each by mouth  daily.   . apixaban (ELIQUIS) 2.5 MG TABS tablet Take 1 tablet (2.5 mg total) by mouth 2 (two) times daily.  Marland Kitchen atenolol (TENORMIN) 50 MG tablet Take 1 tablet (50 mg total) by mouth daily.  . Calcium Carb-Cholecalciferol 600-800 MG-UNIT CHEW Chew 1 each by mouth daily.   . diclofenac sodium (VOLTAREN) 1 % GEL APPLY 2 G TOPICALLY 4 (FOUR) TIMES DAILY AS NEEDED. **PA DENIED**  . diltiazem (CARDIZEM CD) 180 MG 24 hr capsule TAKE ONE CAPSULE BY MOUTH IN THE MORNING  . Glucosamine-MSM-Hyaluronic Acd (JOINT HEALTH PO) Take 1 tablet by mouth daily.  Marland Kitchen glucose blood (ONE TOUCH ULTRA TEST) test strip USE TWICE DAILY TO CHECK BLOOD SUGAR - E11.40  . hydrochlorothiazide (HYDRODIURIL) 25 MG tablet Take 1 tablet (25 mg total) by mouth daily.  Javier Docker Oil 500 MG CAPS Take 500 mg by mouth daily.  . Multiple Vitamins-Minerals (MULTIVITAMIN ADULT PO) Take by mouth.  Marland Kitchen omeprazole (PRILOSEC)  20 MG capsule TAKE ONE CAPSULE BY MOUTH EVERY MORNING 30 MINUTES BEFORE BREAKFAST.  Marland Kitchen vortioxetine HBr (TRINTELLIX) 10 MG TABS Take 1 tablet (10 mg total) by mouth daily.  . [DISCONTINUED] pravastatin (PRAVACHOL) 80 MG tablet TAKE 1 TABLET BY MOUTH EVERY DAY    Objective  Vitals: BP 116/76   Pulse 77   Temp 98.1 F (36.7 C)   Ht 5\' 2"  (1.575 m)   Wt 163 lb 3.2 oz (74 kg)   SpO2 97%   BMI 29.85 kg/m  General: well appearing, no acute distress  Skin:  Warm, no rashes MSK: tr bilateral edema. Foot exam: no erythema, pallor, or cyanosis visible nl proprioception and sensation to monofilament testing bilaterally, +2 distal pulses bilaterally   Commons side effects, risks, benefits, and alternatives for medications and treatment plan prescribed today were discussed, and the patient expressed understanding of the given instructions. Patient is instructed to call or message via MyChart if he/she has any questions or concerns regarding our treatment plan. No barriers to understanding were identified. We discussed Red Flag symptoms and signs in detail. Patient expressed understanding regarding what to do in case of urgent or emergency type symptoms.   Medication list was reconciled, printed and provided to the patient in AVS. Patient instructions and summary information was reviewed with the patient as documented in the AVS. This note was prepared with assistance of Dragon voice recognition software. Occasional wrong-word or sound-a-like substitutions may have occurred due to the inherent limitations of voice recognition software

## 2017-12-02 NOTE — Patient Instructions (Signed)
Please return on 02/23/2018 as scheduled.   We are stopping the hydrochlorothiazide and changing it to lisinopril-hctz daily.   We are stopping pravachol and changing it to crestor. It is a stronger cholesterol lowering medication.   If you have any questions or concerns, please don't hesitate to send me a message via MyChart or call the office at [phone]. Thank you for visiting with [place] today! It's our pleasure caring for you.

## 2017-12-27 DIAGNOSIS — H25011 Cortical age-related cataract, right eye: Secondary | ICD-10-CM | POA: Diagnosis not present

## 2017-12-27 DIAGNOSIS — H25811 Combined forms of age-related cataract, right eye: Secondary | ICD-10-CM | POA: Diagnosis not present

## 2017-12-27 DIAGNOSIS — H2511 Age-related nuclear cataract, right eye: Secondary | ICD-10-CM | POA: Diagnosis not present

## 2018-01-09 ENCOUNTER — Ambulatory Visit: Payer: Medicare Other | Admitting: Family Medicine

## 2018-01-19 ENCOUNTER — Other Ambulatory Visit: Payer: Self-pay | Admitting: Family Medicine

## 2018-01-19 ENCOUNTER — Telehealth: Payer: Self-pay | Admitting: Emergency Medicine

## 2018-01-19 NOTE — Telephone Encounter (Signed)
Copied from [place] [id number]. Topic: Quick Communication - See Telephone Encounter >> Jan 19, 2018  3:59 PM Antonieta Iba C wrote: CRM for notification. See Telephone encounter for: 01/19/18.  Vivian w/ [facility] called in to request from June, 2019 - present.   Fax: [phone]   Phone: [phone]

## 2018-01-23 DIAGNOSIS — I4819 Other persistent atrial fibrillation: Secondary | ICD-10-CM | POA: Diagnosis not present

## 2018-01-23 DIAGNOSIS — I480 Paroxysmal atrial fibrillation: Secondary | ICD-10-CM | POA: Insufficient documentation

## 2018-01-23 DIAGNOSIS — I10 Essential (primary) hypertension: Secondary | ICD-10-CM | POA: Diagnosis not present

## 2018-01-23 DIAGNOSIS — E7800 Pure hypercholesterolemia, unspecified: Secondary | ICD-10-CM | POA: Diagnosis not present

## 2018-01-23 DIAGNOSIS — I4821 Permanent atrial fibrillation: Secondary | ICD-10-CM | POA: Insufficient documentation

## 2018-01-24 DIAGNOSIS — I4891 Unspecified atrial fibrillation: Secondary | ICD-10-CM | POA: Diagnosis not present

## 2018-01-25 DIAGNOSIS — H2512 Age-related nuclear cataract, left eye: Secondary | ICD-10-CM | POA: Diagnosis not present

## 2018-02-09 ENCOUNTER — Ambulatory Visit
Admission: RE | Admit: 2018-02-09 | Discharge: 2018-02-09 | Disposition: A | Discharge status: Home / Self Care | Payer: Medicare Other | Source: Ambulatory Visit | Attending: Family Medicine | Admitting: Family Medicine

## 2018-02-09 DIAGNOSIS — E2839 Other primary ovarian failure: Secondary | ICD-10-CM

## 2018-02-09 DIAGNOSIS — M8580 Other specified disorders of bone density and structure, unspecified site: Secondary | ICD-10-CM

## 2018-02-09 DIAGNOSIS — M8589 Other specified disorders of bone density and structure, multiple sites: Secondary | ICD-10-CM | POA: Diagnosis not present

## 2018-02-09 DIAGNOSIS — Z780 Asymptomatic menopausal state: Secondary | ICD-10-CM | POA: Diagnosis not present

## 2018-02-13 NOTE — Progress Notes (Signed)
Osteopenia with elevated hip frac score 10%. Will discuss treatment with meds at upcoming cpe appt 1/17.  PL updated.

## 2018-02-17 DIAGNOSIS — I081 Rheumatic disorders of both mitral and tricuspid valves: Secondary | ICD-10-CM | POA: Diagnosis not present

## 2018-02-21 ENCOUNTER — Other Ambulatory Visit: Payer: Self-pay | Admitting: Family Medicine

## 2018-02-21 NOTE — Telephone Encounter (Signed)
Please advise if medication can be changed or if OV is needed

## 2018-02-23 ENCOUNTER — Encounter: Payer: Medicare Other | Admitting: Family Medicine

## 2018-02-24 ENCOUNTER — Other Ambulatory Visit: Payer: Self-pay

## 2018-02-24 ENCOUNTER — Encounter: Payer: Self-pay | Admitting: Family Medicine

## 2018-02-24 ENCOUNTER — Ambulatory Visit (INDEPENDENT_AMBULATORY_CARE_PROVIDER_SITE_OTHER): Payer: Medicare Other | Admitting: Family Medicine

## 2018-02-24 VITALS — BP 126/74 | HR 67 | Temp 98.0°F | Resp 16 | Ht 62.0 in | Wt 163.4 lb

## 2018-02-24 DIAGNOSIS — E0590 Thyrotoxicosis, unspecified without thyrotoxic crisis or storm: Secondary | ICD-10-CM

## 2018-02-24 DIAGNOSIS — I10 Essential (primary) hypertension: Secondary | ICD-10-CM

## 2018-02-24 DIAGNOSIS — I480 Paroxysmal atrial fibrillation: Secondary | ICD-10-CM | POA: Diagnosis not present

## 2018-02-24 DIAGNOSIS — M8580 Other specified disorders of bone density and structure, unspecified site: Secondary | ICD-10-CM | POA: Diagnosis not present

## 2018-02-24 DIAGNOSIS — F329 Major depressive disorder, single episode, unspecified: Secondary | ICD-10-CM | POA: Diagnosis not present

## 2018-02-24 DIAGNOSIS — E119 Type 2 diabetes mellitus without complications: Secondary | ICD-10-CM

## 2018-02-24 DIAGNOSIS — Z7901 Long term (current) use of anticoagulants: Secondary | ICD-10-CM | POA: Diagnosis not present

## 2018-02-24 DIAGNOSIS — E7800 Pure hypercholesterolemia, unspecified: Secondary | ICD-10-CM

## 2018-02-24 LAB — COMPREHENSIVE METABOLIC PANEL
ALT: 20 U/L (ref 0–35)
AST: 22 U/L (ref 0–37)
Albumin: 4 g/dL (ref 3.5–5.2)
Alkaline Phosphatase: 76 U/L (ref 39–117)
BUN: 19 mg/dL (ref 6–23)
CO2: 26 mEq/L (ref 19–32)
CREATININE: 1.04 mg/dL (ref 0.40–1.20)
Calcium: 9.3 mg/dL (ref 8.4–10.5)
Chloride: 101 mEq/L (ref 96–112)
GFR: 50.83 mL/min — ABNORMAL LOW (ref >60.00)
GLUCOSE: 181 mg/dL — AB (ref 70–99)
Potassium: 4 mEq/L (ref 3.5–5.1)
Sodium: 137 mEq/L (ref 135–145)
Total Bilirubin: 0.7 mg/dL (ref 0.2–1.2)
Total Protein: 7.4 g/dL (ref 6.0–8.3)

## 2018-02-24 LAB — POCT GLYCOSYLATED HEMOGLOBIN (HGB A1C): Hemoglobin A1C: 6.7 % — AB (ref 4.0–5.6)

## 2018-02-24 LAB — CBC WITH DIFFERENTIAL/PLATELET
BASOS ABS: 0 10*3/uL (ref 0.0–0.1)
BASOS PCT: 0.8 % (ref 0.0–3.0)
EOS PCT: 2.1 % (ref 0.0–5.0)
Eosinophils Absolute: 0.1 10*3/uL (ref 0.0–0.7)
HEMATOCRIT: 44.8 % (ref 36.0–46.0)
Hemoglobin: 15.1 g/dL — ABNORMAL HIGH (ref 12.0–15.0)
LYMPHS PCT: 24 % (ref 12.0–46.0)
Lymphs Abs: 1.3 10*3/uL (ref 0.7–4.0)
MCHC: 33.7 g/dL (ref 30.0–36.0)
MCV: 88.7 fl (ref 78.0–100.0)
MONOS PCT: 9.4 % (ref 3.0–12.0)
Monocytes Absolute: 0.5 10*3/uL (ref 0.1–1.0)
NEUTROS ABS: 3.4 10*3/uL (ref 1.4–7.7)
Neutrophils Relative %: 63.7 % (ref 43.0–77.0)
PLATELETS: 165 10*3/uL (ref 150.0–400.0)
RBC: 5.05 Mil/uL (ref 3.87–5.11)
RDW: 13 % (ref 11.5–15.5)
WBC: 5.4 10*3/uL (ref 4.0–10.5)

## 2018-02-24 LAB — LIPID PANEL
Cholesterol: 156 mg/dL (ref 0–200)
HDL: 59.6 mg/dL (ref >39.00)
LDL Cholesterol: 60 mg/dL (ref 0–99)
NONHDL: 96.64
Total CHOL/HDL Ratio: 3
Triglycerides: 181 mg/dL — ABNORMAL HIGH (ref 0.0–149.0)
VLDL: 36.2 mg/dL (ref 0.0–40.0)

## 2018-02-24 LAB — TSH: TSH: 0.39 u[IU]/mL (ref 0.35–4.50)

## 2018-02-24 LAB — T4, FREE: Free T4: 0.95 ng/dL (ref 0.60–1.60)

## 2018-02-24 MED ORDER — ALENDRONATE SODIUM 70 MG PO TABS: 70.0000 mg | ORAL_TABLET | ORAL | 3 refills | Status: DC

## 2018-02-24 NOTE — Patient Instructions (Signed)
Please return in 3 months for diabetes follow up  Please go to the pharmacy to get your shingles vaccination.  We are starting fosamax to help protect your bones.   Your diabetes and blood pressure look good.   I will release your lab results to you on your MyChart account with further instructions. Please reply with any questions.    If you have any questions or concerns, please don't hesitate to send me a message via MyChart or call the office at [phone]. Thank you for visiting with [place] today! It's our pleasure caring for you.

## 2018-02-24 NOTE — Progress Notes (Signed)
Subjective  Chief Complaint  Patient presents with  . Annual Exam    Pt is not fasting  . Diabetes    Needing med refill  . Hypertension  . Hyperlipidemia    Needing med refill  . Depression    States since the holidays to present day she has been feeling depressed    HPI: Nicole Jimenez is a 84 y.o. female who presents to [place] [facility] today for a Female Wellness Visit. She also has the concerns and/or needs as listed above in the chief complaint. These will be addressed in addition to the Health Maintenance Visit.   Wellness Visit: annual visit with health maintenance review and exam without Pap   Nicole Jimenez is doing well overall.  She continues to worry a lot which is her chronic personality type.  She does have depression and is on Trintellix.  That is doing fairly well.  Physically, she is feeling well.  Health maintenance issues are up-to-date.  She is due Shingrix.  Osteopenia with elevated fracture scores.  For this reason she is a candidate for Fosamax.  No history of fracture Chronic disease f/u and/or acute problem visit: (deemed necessary to be done in addition to the wellness visit):  Hypertension is well controlled on current medications.  Hyperlipidemia: We increased her statin intensity to Crestor 20 at last visit.  She is tolerating it well.  Due for recheck.  Diabetes follow up: Her diabetic control is reported as Unchanged.  It has been well controlled, diet controlled only.  No symptoms of hyperglycemia.  Eye exam is up-to-date.  No retinopathy.  She does have cataracts. She denies exertional CP or SOB or symptomatic hypoglycemia. She denies foot sores or paresthesias. Immunization History  Administered Date(s) Administered  . Influenza Split 01/24/2012  . Influenza, High Dose Seasonal PF 12/02/2013, 01/20/2015, 12/28/2016  . Influenza,inj,Quad PF,6+ Mos 11/21/2015, 11/18/2017  . Influenza-Unspecified 01/24/2012, 01/05/2014,  01/05/2014, 01/20/2015  . Pneumococcal Conjugate-13 03/06/2015, 03/06/2015  . Pneumococcal Polysaccharide-23 06/18/2016  . Zoster 10/09/2012    Diabetes Related Lab Review: Lab Results  Component Value Date   HGBA1C 6.7 (A) 02/24/2018   Lab Results  Component Value Date   MICROALBUR 5.6 (H) 11/18/2017   Lab Results  Component Value Date   CREATININE 0.81 11/18/2017   BUN 19 11/18/2017   NA 140 11/18/2017   K 4.0 11/18/2017   CL 101 11/18/2017   CO2 29 11/18/2017   Lab Results  Component Value Date   CHOL 211 (H) 11/18/2017   CHOL 167 01/24/2017   Lab Results  Component Value Date   HDL 70.00 11/18/2017   HDL 65 01/24/2017   Lab Results  Component Value Date   LDLCALC 114 (H) 11/18/2017   LDLCALC 72 01/24/2017   Lab Results  Component Value Date   TRIG 134.0 11/18/2017   TRIG 204 (H) 01/24/2017   Lab Results  Component Value Date   CHOLHDL 3 11/18/2017   CHOLHDL 2.6 01/24/2017   No results found for: LDLDIRECT The ASCVD Risk score Mikey Bussing DC Jr., et al., 2013) failed to calculate for the following reasons:   The 2013 ASCVD risk score is only valid for ages 48 to 47  Assessment  1. Diet-controlled diabetes mellitus ([place])   2. Subclinical hyperthyroidism   3. Essential hypertension   4. Hypercholesterolemia   5. Major depression, chronic   6. Paroxysmal atrial fibrillation (HCC)   7. Anticoagulated   8. Osteopenia, unspecified location  Plan  Female Wellness Visit:  Age appropriate Health Maintenance and Prevention measures were discussed with patient. Included topics are cancer screening recommendations, ways to keep healthy (see AVS) including dietary and exercise recommendations, regular eye and dental care, use of seat belts, and avoidance of moderate alcohol use and tobacco use.   BMI: discussed patient's BMI and encouraged positive lifestyle modifications to help get to or maintain a target BMI.  HM needs and immunizations were addressed and  ordered. See below for orders. See HM and immunization section for updates.  Recommend patient take Shingrix prescription to pharmacy to get.  Routine labs and screening tests ordered including cmp, cbc and lipids where appropriate.  Discussed recommendations regarding Vit D and calcium supplementation (see AVS)  Chronic disease management visit and/or acute problem visit:  Osteopenia with elevated fracture score: Start Fosamax.  Education appropriate use risks and benefits  Hypertension, hyperlipidemia and diet-controlled diabetes are all well controlled.  Recheck lipid panel today.  Continue current medications.  Depression: Continue to do well.  Reassured.  Continue counseling through church support services.  Continue medications.  Atrial fibrillation managed by cardiology.  Reviewed most recent records.  Continue on anticoagulation.  Recheck TFTs.  Managed by endocrine.  Euthyroid clinically. Follow up: Return in about 3 months (around 05/26/2018) for follow up of diabetes and hypertension.  Orders Placed This Encounter  Procedures  . CBC with Differential/Platelet  . Comprehensive metabolic panel  . Lipid panel  . TSH  . T4, free  . T3  . POCT glycosylated hemoglobin (Hb A1C)   Meds ordered this encounter  Medications  . alendronate (FOSAMAX) 70 MG tablet    Sig: Take 1 tablet (70 mg total) by mouth once a week. Take with a full glass of water on an empty stomach.    Dispense:  12 tablet    Refill:  3      Lifestyle: Body mass index is 29.89 kg/m. Wt Readings from Last 3 Encounters:  02/24/18 163 lb 6.4 oz (74.1 kg)  12/02/17 163 lb 3.2 oz (74 kg)  11/18/17 163 lb 6.4 oz (74.1 kg)    Patient Active Problem List   Diagnosis Date Noted  . Diastolic CHF ([place]) 98/12/9145    Priority: High    Echo 11/2016, nl EF but LVH and diastolic dysfunction  Overview:  Overview:  Echo 11/2016, nl EF but LVH and diastolic dysfunction  Echo 11/2016, nl EF but LVH and  diastolic dysfunction   . Diet-controlled diabetes mellitus ([place]) 01/24/2017    Priority: High  . Subclinical hyperthyroidism 01/21/2014    Priority: High  . Essential hypertension 02/23/2013    Priority: High  . Hypercholesterolemia 02/23/2013    Priority: High  . Major depression, chronic 07/18/2012    Priority: High  . DJD of right shoulder 01/24/2017    Priority: Medium  . Bereavement due to life event 03/05/2016    Priority: Medium    Overview:  Overview:  Her husband of 65 years died in 02/17/2016 Overview:  Her husband of 51 years died in 02-17-2016  Overview:  Her husband of 71 years died in February 17, 2016   . Osteopenia 08/23/2015    Priority: Medium    dexa 2017: T = -1.0 at femur, elevated FRAX score.  Repeat 2019 Osteopenia with elevated hip frac score of 10%:  T lowest = - 1.8 Major Osteoporotic Fracture: 17.1% Hip Fracture:  10.4% Population:                  [place] ([place]) Risk Factors:                Family Hist. (Parent hip fracture) Offer medications due to elevated hip fracture risk score.    . Idiopathic scoliosis of thoracic spine 07/09/2014    Priority: Medium  . DDD (degenerative disc disease), lumbosacral 07/15/2012    Priority: Medium  . Lumbosacral spondylosis 07/15/2012    Priority: Medium  . Osteoarthritis of knee 07/15/2012    Priority: Medium  . Bilateral edema of lower extremity 01/26/2017    Priority: Low  . Bilateral hip bursitis 07/17/2015    Priority: Low  . Age-related nuclear cataract of both eyes 03/11/2015    Priority: Low  . Insomnia 09/04/2013    Priority: Low  . Sjogren's syndrome ([place]) 08/04/2012    Priority: Low  . Allergic rhinitis 07/15/2012    Priority: Low  . Paroxysmal atrial fibrillation ([place]) 01/23/2018  . Embolus of femoral artery ([place]) 01/26/2017   Health Maintenance  Topic Date Due  . HEMOGLOBIN A1C  05/20/2018  . OPHTHALMOLOGY EXAM  11/18/2018  . FOOT EXAM  02/25/2019  . DEXA SCAN  02/10/2020  .  INFLUENZA VACCINE  Completed  . PNA vac Low Risk Adult  Completed   Immunization History  Administered Date(s) Administered  . Influenza Split 01/24/2012  . Influenza, High Dose Seasonal PF 12/02/2013, 01/20/2015, 12/28/2016  . Influenza,inj,Quad PF,6+ Mos 11/21/2015, 11/18/2017  . Influenza-Unspecified 01/24/2012, 01/05/2014, 01/05/2014, 01/20/2015  . Pneumococcal Conjugate-13 03/06/2015, 03/06/2015  . Pneumococcal Polysaccharide-23 06/18/2016  . Zoster 10/09/2012   We updated and reviewed the patient's past history in detail and it is documented below. Allergies: Patient is allergic to amoxicillin-pot clavulanate; amoxicillin; and clarithromycin. Past Medical History Patient  has a past medical history of Arthritis, Depression, Diabetes mellitus without complication ([place]), Diastolic CHF ([place]) (05/04/7122), GERD (gastroesophageal reflux disease), Hyperlipidemia, Hypertension, Peripheral arterial disease ([place]) (2018), and Thyroid disease. Past Surgical History Patient  has a past surgical history that includes Femoral endarterectomy (Right, 11/29/2016). Family History: Patient family history includes Arthritis in her mother and sister; Diabetes in her father and sister; Healthy in her daughter, son, and son; Heart attack in her father and mother; Hyperlipidemia in her father; Other in her son. Social History:  Patient  reports that she has never smoked. She has never used smokeless tobacco. She reports that she does not drink alcohol or use drugs.  Review of Systems: Constitutional: negative for fever or malaise Ophthalmic: negative for photophobia, double vision or loss of vision Cardiovascular: negative for chest pain, dyspnea on exertion, or new LE swelling Respiratory: negative for SOB or persistent cough Gastrointestinal: negative for abdominal pain, change in bowel habits or melena Genitourinary: negative for dysuria or gross hematuria, no abnormal uterine bleeding or  disharge Musculoskeletal: negative for new gait disturbance or muscular weakness Integumentary: negative for new or persistent rashes, no breast lumps Neurological: negative for TIA or stroke symptoms Psychiatric: negative for SI or delusions Allergic/Immunologic: negative for hives  Patient Care Team    Relationship Specialty Notifications Start End  Leamon Arnt, MD PCP - General Family Medicine  01/24/17   Roel Cluck, MD Referring Physician Ophthalmology  01/24/17   Alanson Aly, MD Consulting Physician Endocrinology  01/24/17   Dinah Beers, MD Referring Physician Vascular Surgery  01/24/17   Gaynelle Arabian, [place] Physician Orthopedic Surgery  07/08/17  Objective  Vitals: BP 126/74   Pulse 67   Temp 98 F (36.7 C) (Oral)   Resp 16   Ht 5\' 2"  (1.575 m)   Wt 163 lb 6.4 oz (74.1 kg)   SpO2 98%   BMI 29.89 kg/m  General:  Well developed, well nourished, no acute distress  Psych:  Alert and orientedx3,normal mood and affect HEENT:  Normocephalic, atraumatic, non-icteric sclera, PERRL, oropharynx is clear without mass or exudate, supple neck without adenopathy, mass or thyromegaly Cardiovascular: Irregularly irregular with 2/6 systolic murmur respiratory:  Good breath sounds bilaterally, CTAB with normal respiratory effort Gastrointestinal: normal bowel sounds, soft, non-tender, no noted masses. No HSM MSK: no deformities, contusions. Joints are without erythema or swelling. Spine and CVA region are nontender Skin:  Warm, no rashes or suspicious lesions noted Neurologic:    Mental status is normal. CN 2-11 are normal. Gross motor and sensory exams are normal. Normal gait. No tremor Breast Exam: No mass, skin retraction or nipple discharge is appreciated in either breast. No axillary adenopathy. Fibrocystic changes are not noted    Commons side effects, risks, benefits, and alternatives for medications and treatment plan prescribed today were discussed,  and the patient expressed understanding of the given instructions. Patient is instructed to call or message via MyChart if he/she has any questions or concerns regarding our treatment plan. No barriers to understanding were identified. We discussed Red Flag symptoms and signs in detail. Patient expressed understanding regarding what to do in case of urgent or emergency type symptoms.   Medication list was reconciled, printed and provided to the patient in AVS. Patient instructions and summary information was reviewed with the patient as documented in the AVS. This note was prepared with assistance of Dragon voice recognition software. Occasional wrong-word or sound-a-like substitutions may have occurred due to the inherent limitations of voice recognition software

## 2018-02-25 LAB — T3: T3, Total: 111 ng/dL (ref 76–181)

## 2018-03-02 ENCOUNTER — Other Ambulatory Visit: Payer: Self-pay | Admitting: Family Medicine

## 2018-03-06 DIAGNOSIS — Z7901 Long term (current) use of anticoagulants: Secondary | ICD-10-CM | POA: Diagnosis not present

## 2018-03-06 DIAGNOSIS — I4891 Unspecified atrial fibrillation: Secondary | ICD-10-CM | POA: Diagnosis not present

## 2018-03-06 DIAGNOSIS — I10 Essential (primary) hypertension: Secondary | ICD-10-CM | POA: Diagnosis not present

## 2018-03-06 DIAGNOSIS — I480 Paroxysmal atrial fibrillation: Secondary | ICD-10-CM | POA: Diagnosis not present

## 2018-03-06 DIAGNOSIS — E7800 Pure hypercholesterolemia, unspecified: Secondary | ICD-10-CM | POA: Diagnosis not present

## 2018-03-07 DIAGNOSIS — H2512 Age-related nuclear cataract, left eye: Secondary | ICD-10-CM | POA: Diagnosis not present

## 2018-03-07 DIAGNOSIS — H25812 Combined forms of age-related cataract, left eye: Secondary | ICD-10-CM | POA: Diagnosis not present

## 2018-03-07 DIAGNOSIS — H25012 Cortical age-related cataract, left eye: Secondary | ICD-10-CM | POA: Diagnosis not present

## 2018-03-21 DIAGNOSIS — I440 Atrioventricular block, first degree: Secondary | ICD-10-CM | POA: Diagnosis not present

## 2018-03-21 DIAGNOSIS — I4891 Unspecified atrial fibrillation: Secondary | ICD-10-CM | POA: Diagnosis not present

## 2018-03-21 DIAGNOSIS — I480 Paroxysmal atrial fibrillation: Secondary | ICD-10-CM | POA: Diagnosis not present

## 2018-04-05 ENCOUNTER — Other Ambulatory Visit: Payer: Self-pay | Admitting: *Deleted

## 2018-04-05 MED ORDER — DILTIAZEM HCL ER COATED BEADS 180 MG PO CP24: 180.0000 mg | ORAL_CAPSULE | Freq: Every morning | ORAL | 3 refills | Status: DC

## 2018-04-18 ENCOUNTER — Encounter: Payer: Self-pay | Admitting: *Deleted

## 2018-04-18 ENCOUNTER — Telehealth: Payer: Self-pay

## 2018-04-18 NOTE — Telephone Encounter (Signed)
LVOM that letter is completed and in the bin to be picked up.

## 2018-04-18 NOTE — Telephone Encounter (Signed)
[place] for note per Dr. Jonni Sanger

## 2018-04-18 NOTE — Telephone Encounter (Signed)
Copied from [place] [id number]. Topic: General - Other >> Apr 18, 2018  2:01 PM Bea Graff, NT wrote: Reason for CRM: Pt states she needs a letter so that she can cancel her trip for a river cruise on the [place] due to the coronavirus and she is diabetic. Her travel insurance requires a note from the doctor.

## 2018-04-20 DIAGNOSIS — I480 Paroxysmal atrial fibrillation: Secondary | ICD-10-CM | POA: Diagnosis not present

## 2018-04-24 DIAGNOSIS — R9431 Abnormal electrocardiogram [ECG] [EKG]: Secondary | ICD-10-CM | POA: Diagnosis not present

## 2018-04-24 DIAGNOSIS — I4891 Unspecified atrial fibrillation: Secondary | ICD-10-CM | POA: Diagnosis not present

## 2018-05-13 ENCOUNTER — Other Ambulatory Visit: Payer: Self-pay | Admitting: Family Medicine

## 2018-05-24 ENCOUNTER — Encounter: Payer: Self-pay | Admitting: *Deleted

## 2018-05-24 ENCOUNTER — Other Ambulatory Visit: Payer: Self-pay | Admitting: Family Medicine

## 2018-05-24 MED ORDER — GLUCOSE BLOOD VI STRP: ORAL_STRIP | 1 refills | Status: DC

## 2018-06-05 ENCOUNTER — Ambulatory Visit: Payer: Medicare Other | Admitting: Family Medicine

## 2018-06-12 ENCOUNTER — Ambulatory Visit (INDEPENDENT_AMBULATORY_CARE_PROVIDER_SITE_OTHER): Payer: Medicare Other | Admitting: Family Medicine

## 2018-06-12 ENCOUNTER — Encounter: Payer: Self-pay | Admitting: Family Medicine

## 2018-06-12 ENCOUNTER — Other Ambulatory Visit: Payer: Self-pay

## 2018-06-12 VITALS — BP 112/72 | HR 90

## 2018-06-12 DIAGNOSIS — I10 Essential (primary) hypertension: Secondary | ICD-10-CM | POA: Diagnosis not present

## 2018-06-12 DIAGNOSIS — M8580 Other specified disorders of bone density and structure, unspecified site: Secondary | ICD-10-CM

## 2018-06-12 DIAGNOSIS — E119 Type 2 diabetes mellitus without complications: Secondary | ICD-10-CM

## 2018-06-12 DIAGNOSIS — M3500 Sicca syndrome, unspecified: Secondary | ICD-10-CM | POA: Diagnosis not present

## 2018-06-12 DIAGNOSIS — F329 Major depressive disorder, single episode, unspecified: Secondary | ICD-10-CM | POA: Diagnosis not present

## 2018-06-12 DIAGNOSIS — Z7901 Long term (current) use of anticoagulants: Secondary | ICD-10-CM

## 2018-06-12 NOTE — Assessment & Plan Note (Signed)
Control is fair clinically. Will bring into office in 6-8 weeks to recheck a1c. Continue diabetic diet and exercise.

## 2018-06-12 NOTE — Progress Notes (Signed)
Virtual Visit via Video Note  Subjective  CC:  Chief Complaint  Patient presents with  . Diabetes  . Hypertension     I connected with Nicole Jimenez on 06/12/18 at 11:20 AM EDT by a video enabled telemedicine application and verified that I am speaking with the correct person using two identifiers. Location patient: Home Location provider: [facility] [place] participating in the virtual visit: Solomia Harrell, Leamon Arnt, MD Lilli Light, [place] discussed the limitations of evaluation and management by telemedicine and the availability of in person appointments. The patient expressed understanding and agreed to proceed. HPI: Nicole Jimenez is a 84 y.o. female who was contacted today to address the problems listed above in the chief complaint, f/u diabetes:  Diabetes follow up: Her diabetic control is reported as Unchanged. She is diet controlled. Fasting sugars remain 110-130s. Denies sxs of hyperglycemia.  She denies exertional CP or SOB or symptomatic hypoglycemia. She denies foot sores or paresthesias.  HTN f/u: doing well. Today's bp was normal. 112/72 Feeling well. Taking medications w/o adverse effects. No symptoms of CHF, angina; no palpitations, sob, cp or lower extremity edema. Compliant with meds.   Depression has improved. Remains on trintellix. Tolerating stay at home order well. She actually enjoys being home. Is reading a lot.   sjogren's - no new concerns.   Osteopenia with elevated fracture risk: hasn't started fosamax due to perceived risks and possible side effects. Would like to hold off on treatment at this time. Too many friends had problems with the medication.  Immunization History  Administered Date(s) Administered  . Influenza Split 01/24/2012  . Influenza, High Dose Seasonal PF 12/02/2013, 01/20/2015, 12/28/2016  . Influenza,inj,Quad PF,6+ Mos 11/21/2015, 11/18/2017  . Influenza-Unspecified 01/24/2012,  01/05/2014, 01/05/2014, 01/20/2015  . Pneumococcal Conjugate-13 03/06/2015, 03/06/2015  . Pneumococcal Polysaccharide-23 06/18/2016  . Zoster 10/09/2012  . Zoster Recombinat (Shingrix) 03/09/2018    Diabetes Related Lab Review: Lab Results  Component Value Date   HGBA1C 6.7 (A) 02/24/2018   HGBA1C 6.5 (A) 11/18/2017   HGBA1C 6.0 (H) 01/24/2017    Lab Results  Component Value Date   MICROALBUR 5.6 (H) 11/18/2017   Lab Results  Component Value Date   CREATININE 1.04 02/24/2018   BUN 19 02/24/2018   NA 137 02/24/2018   K 4.0 02/24/2018   CL 101 02/24/2018   CO2 26 02/24/2018   Lab Results  Component Value Date   CHOL 156 02/24/2018   CHOL 211 (H) 11/18/2017   CHOL 167 01/24/2017   Lab Results  Component Value Date   HDL 59.60 02/24/2018   HDL 70.00 11/18/2017   HDL 65 01/24/2017   Lab Results  Component Value Date   LDLCALC 60 02/24/2018   LDLCALC 114 (H) 11/18/2017   LDLCALC 72 01/24/2017   Lab Results  Component Value Date   TRIG 181.0 (H) 02/24/2018   TRIG 134.0 11/18/2017   TRIG 204 (H) 01/24/2017   Lab Results  Component Value Date   CHOLHDL 3 02/24/2018   CHOLHDL 3 11/18/2017   CHOLHDL 2.6 01/24/2017   No results found for: LDLDIRECT The ASCVD Risk score Mikey Bussing DC Jr., et al., 2013) failed to calculate for the following reasons:   The 2013 ASCVD risk score is only valid for ages 78 to 11  BP Readings from Last 3 Encounters:  06/12/18 112/72  02/24/18 126/74  12/02/17 116/76   Wt Readings from Last 3 Encounters:  02/24/18  163 lb 6.4 oz (74.1 kg)  12/02/17 163 lb 3.2 oz (74 kg)  11/18/17 163 lb 6.4 oz (74.1 kg)    Health Maintenance  Topic Date Due  . HEMOGLOBIN A1C  08/25/2018  . INFLUENZA VACCINE  09/09/2018  . OPHTHALMOLOGY EXAM  11/18/2018  . FOOT EXAM  02/25/2019  . DEXA SCAN  02/10/2020  . PNA vac Low Risk Adult  Completed    Assessment  1. Diet-controlled diabetes mellitus ([place])   2. Essential hypertension   3. Major  depression, chronic   4. Sjogren's syndrome, with unspecified organ involvement ([place]) Chronic  5. Osteopenia, unspecified location   6. Anticoagulant long-term use      Plan   See below for problem based assessment and plan documentation  Diabetic education: ongoing education regarding chronic disease management for diabetes was given today. We continue to reinforce the ABC's of diabetic management: A1c (<7 or 8 dependent upon patient), tight blood pressure control, and cholesterol management with goal LDL < 100 minimally. We discuss diet strategies, exercise recommendations, medication options and possible side effects. At each visit, we review recommended immunizations and preventive care recommendations for diabetics and stress that good diabetic control can prevent other problems. See below for this patient's data.  I discussed the assessment and treatment plan with the patient. The patient was provided an opportunity to ask questions and all were answered. The patient agreed with the plan and demonstrated an understanding of the instructions.   The patient was advised to call back or seek an in-person evaluation if the symptoms worsen or if the condition fails to improve as anticipated. Follow up: 6-8 weeks for f/u dm and htn.  Visit date not found  No orders of the defined types were placed in this encounter.     I reviewed the patients updated PMH, FH, and SocHx.    Patient Active Problem List   Diagnosis Date Noted  . Anticoagulant long-term use 03/06/2018    Priority: High  . Paroxysmal atrial fibrillation (HCC) 01/23/2018    Priority: High  . Diastolic CHF ([place]) 95/28/4132    Priority: High  . Diet-controlled diabetes mellitus ([place]) 01/24/2017    Priority: High  . Subclinical hyperthyroidism 01/21/2014    Priority: High  . Essential hypertension 02/23/2013    Priority: High  . Hypercholesterolemia 02/23/2013    Priority: High  . Major depression, chronic 07/18/2012     Priority: High  . DJD of right shoulder 01/24/2017    Priority: Medium  . Bereavement due to life event 03/05/2016    Priority: Medium  . Osteopenia 08/23/2015    Priority: Medium  . Idiopathic scoliosis of thoracic spine 07/09/2014    Priority: Medium  . Sjogren's syndrome ([place]) 08/04/2012    Priority: Medium  . DDD (degenerative disc disease), lumbosacral 07/15/2012    Priority: Medium  . Lumbosacral spondylosis 07/15/2012    Priority: Medium  . Osteoarthritis of knee 07/15/2012    Priority: Medium  . Bilateral edema of lower extremity 01/26/2017    Priority: Low  . Bilateral hip bursitis 07/17/2015    Priority: Low  . Age-related nuclear cataract of both eyes 03/11/2015    Priority: Low  . Insomnia 09/04/2013    Priority: Low  . Allergic rhinitis 07/15/2012    Priority: Low   Current Meds  Medication Sig  . Acetaminophen 500 MG coapsule Take 1,000 mg by mouth as needed.   Marland Kitchen atenolol (TENORMIN) 50 MG tablet Take 1 tablet (50 mg total)  by mouth daily.  . Calcium Carb-Cholecalciferol 600-800 MG-UNIT CHEW Chew 1 each by mouth daily.   . diclofenac sodium (VOLTAREN) 1 % GEL APPLY 2 G TOPICALLY 4 (FOUR) TIMES DAILY AS NEEDED. **PA DENIED**  . diltiazem (CARDIZEM CD) 180 MG 24 hr capsule Take 1 capsule (180 mg total) by mouth every morning.  Marland Kitchen ELIQUIS 2.5 MG TABS tablet TAKE 1 TABLET BY MOUTH TWICE A DAY  . Glucosamine-MSM-Hyaluronic Acd (JOINT HEALTH PO) Take 1 tablet by mouth daily.  Marland Kitchen glucose blood (ONE TOUCH ULTRA TEST) test strip USE TWICE DAILY TO CHECK BLOOD SUGAR - E11.40  . Krill Oil 500 MG CAPS Take 500 mg by mouth daily.  Marland Kitchen lisinopril-hydrochlorothiazide (PRINZIDE,ZESTORETIC) 10-12.5 MG tablet Take 1 tablet by mouth daily.  . Multiple Vitamins-Minerals (MULTIVITAMIN ADULT PO) Take by mouth.  Marland Kitchen omeprazole (PRILOSEC) 20 MG capsule TAKE ONE CAPSULE BY MOUTH EVERY MORNING 30 MINUTES BEFORE BREAKFAST.  . rosuvastatin (CRESTOR) 20 MG tablet Take 1 tablet (20 mg total) by  mouth daily.  . TRINTELLIX 10 MG TABS tablet TAKE 1 TABLET BY MOUTH EVERY DAY    Allergies: Patient is allergic to amoxicillin-pot clavulanate; amoxicillin; and clarithromycin. Family History: Patient family history includes Arthritis in her mother and sister; Diabetes in her father and sister; Healthy in her daughter, son, and son; Heart attack in her father and mother; Hyperlipidemia in her father; Other in her son. Social History:  Patient  reports that she has never smoked. She has never used smokeless tobacco. She reports that she does not drink alcohol or use drugs.  Review of Systems: Constitutional: Negative for fever malaise or anorexia Cardiovascular: negative for chest pain Respiratory: negative for SOB or persistent cough Gastrointestinal: negative for abdominal pain  OBJECTIVE Vitals: BP 112/72   Pulse 90  General: no acute distress , A&Ox3  Leamon Arnt, MD   Interactive audio and video telecommunications were attempted between this provider and patient, however failed, due to patient having technical difficulties OR patient did not have access to video capability.  We continued and completed visit with audio only.

## 2018-06-12 NOTE — Assessment & Plan Note (Signed)
This medical condition is well controlled. There are no signs of complications, medication side effects, or red flags. Patient is instructed to continue the current treatment plan without change in therapies or medications.  Continue trintellix

## 2018-06-12 NOTE — Assessment & Plan Note (Signed)
This medical condition is well controlled. There are no signs of complications, medication side effects, or red flags. Patient is instructed to continue the current treatment plan without change in therapies or medications.   

## 2018-06-12 NOTE — Assessment & Plan Note (Signed)
Stable per rheum

## 2018-06-12 NOTE — Assessment & Plan Note (Signed)
Ok to restart eloquis and aspirin tx now - 4 days post tooth removal.

## 2018-06-12 NOTE — Assessment & Plan Note (Signed)
Pt deferring biphosphonates at this time. We discussed risks/benefits and indications. She is osteopenic with an increased FRAX score. Will hold off on meds now and readdress in 1-2 years. Continue weight bearing exercise and vitd and calcium supplements.

## 2018-06-27 ENCOUNTER — Other Ambulatory Visit: Payer: Self-pay | Admitting: Family Medicine

## 2018-07-31 ENCOUNTER — Ambulatory Visit: Payer: Medicare Other | Admitting: Family Medicine

## 2018-07-31 ENCOUNTER — Telehealth: Payer: Self-pay | Admitting: Physical Therapy

## 2018-07-31 NOTE — Telephone Encounter (Signed)
Copied from [place] [id number]. Topic: Appointment Scheduling - Scheduling Inquiry for Clinic >> Jul 31, 2018  2:03 PM Berneta Levins wrote: Reason for CRM:   Pt returned a call to reschedule her appointment.  Tried office 3x.

## 2018-07-31 NOTE — Telephone Encounter (Signed)
Patient is scheduled. No need to route

## 2018-08-08 ENCOUNTER — Ambulatory Visit: Payer: Medicare Other | Admitting: Family Medicine

## 2018-08-17 ENCOUNTER — Other Ambulatory Visit: Payer: Self-pay | Admitting: *Deleted

## 2018-08-17 DIAGNOSIS — M7989 Other specified soft tissue disorders: Secondary | ICD-10-CM | POA: Diagnosis not present

## 2018-08-17 DIAGNOSIS — Z48812 Encounter for surgical aftercare following surgery on the circulatory system: Secondary | ICD-10-CM | POA: Diagnosis not present

## 2018-08-17 DIAGNOSIS — I739 Peripheral vascular disease, unspecified: Secondary | ICD-10-CM | POA: Diagnosis not present

## 2018-08-17 MED ORDER — ATENOLOL 50 MG PO TABS: 50.0000 mg | ORAL_TABLET | Freq: Every day | ORAL | 3 refills | Status: DC

## 2018-08-21 ENCOUNTER — Ambulatory Visit (INDEPENDENT_AMBULATORY_CARE_PROVIDER_SITE_OTHER): Payer: Medicare Other | Admitting: Family Medicine

## 2018-08-21 ENCOUNTER — Other Ambulatory Visit: Payer: Self-pay

## 2018-08-21 ENCOUNTER — Encounter: Payer: Self-pay | Admitting: Family Medicine

## 2018-08-21 VITALS — BP 132/74 | HR 84 | Temp 98.2°F | Resp 14 | Ht 62.0 in | Wt 162.0 lb

## 2018-08-21 DIAGNOSIS — E0590 Thyrotoxicosis, unspecified without thyrotoxic crisis or storm: Secondary | ICD-10-CM

## 2018-08-21 DIAGNOSIS — I10 Essential (primary) hypertension: Secondary | ICD-10-CM | POA: Diagnosis not present

## 2018-08-21 DIAGNOSIS — F329 Major depressive disorder, single episode, unspecified: Secondary | ICD-10-CM | POA: Diagnosis not present

## 2018-08-21 DIAGNOSIS — E119 Type 2 diabetes mellitus without complications: Secondary | ICD-10-CM | POA: Diagnosis not present

## 2018-08-21 DIAGNOSIS — I5032 Chronic diastolic (congestive) heart failure: Secondary | ICD-10-CM

## 2018-08-21 DIAGNOSIS — Z86718 Personal history of other venous thrombosis and embolism: Secondary | ICD-10-CM | POA: Diagnosis not present

## 2018-08-21 DIAGNOSIS — Z634 Disappearance and death of family member: Secondary | ICD-10-CM

## 2018-08-21 DIAGNOSIS — M5137 Other intervertebral disc degeneration, lumbosacral region: Secondary | ICD-10-CM

## 2018-08-21 DIAGNOSIS — M8580 Other specified disorders of bone density and structure, unspecified site: Secondary | ICD-10-CM

## 2018-08-21 DIAGNOSIS — L659 Nonscarring hair loss, unspecified: Secondary | ICD-10-CM | POA: Diagnosis not present

## 2018-08-21 DIAGNOSIS — I480 Paroxysmal atrial fibrillation: Secondary | ICD-10-CM

## 2018-08-21 DIAGNOSIS — Z7901 Long term (current) use of anticoagulants: Secondary | ICD-10-CM | POA: Diagnosis not present

## 2018-08-21 LAB — POCT GLYCOSYLATED HEMOGLOBIN (HGB A1C): Hemoglobin A1C: 6.5 % — AB (ref 4.0–5.6)

## 2018-08-21 LAB — T4, FREE: Free T4: 0.84 ng/dL (ref 0.60–1.60)

## 2018-08-21 LAB — SEDIMENTATION RATE: Sed Rate: 42 mm/hr — ABNORMAL HIGH (ref 0–30)

## 2018-08-21 LAB — TSH: TSH: 0.75 u[IU]/mL (ref 0.35–4.50)

## 2018-08-21 NOTE — Patient Instructions (Addendum)
Please return in 3 months for diabetes follow up  We will call you with information regarding your referral appointment. Dermatology. If you do not hear from [place] within the next 2 weeks, please let me know. It can take 2-6 weeks to get appointments set up with the specialists.    If you have any questions or concerns, please don't hesitate to send me a message via MyChart or call the office at [phone]. Thank you for visiting with [place] today! It's our pleasure caring for you.   Back Exercises The following exercises strengthen the muscles that help to support the trunk and back. They also help to keep the lower back flexible. Doing these exercises can help to prevent back pain or lessen existing pain.  If you have back pain or discomfort, try doing these exercises 2-3 times each day or as told by your health care provider.  As your pain improves, do them once each day, but increase the number of times that you repeat the steps for each exercise (do more repetitions).  To prevent the recurrence of back pain, continue to do these exercises once each day or as told by your health care provider. Do exercises exactly as told by your health care provider and adjust them as directed. It is normal to feel mild stretching, pulling, tightness, or discomfort as you do these exercises, but you should stop right away if you feel sudden pain or your pain gets worse. Exercises Single knee to chest Repeat these steps 3-5 times for each leg: 1. Lie on your back on a firm bed or the floor with your legs extended. 2. Bring one knee to your chest. Your other leg should stay extended and in contact with the floor. 3. Hold your knee in place by grabbing your knee or thigh with both hands and hold. 4. Pull on your knee until you feel a gentle stretch in your lower back or buttocks. 5. Hold the stretch for 10-30 seconds. 6. Slowly release and straighten your leg. Pelvic tilt Repeat these steps 5-10 times: 1.  Lie on your back on a firm bed or the floor with your legs extended. 2. Bend your knees so they are pointing toward the ceiling and your feet are flat on the floor. 3. Tighten your lower abdominal muscles to press your lower back against the floor. This motion will tilt your pelvis so your tailbone points up toward the ceiling instead of pointing to your feet or the floor. 4. With gentle tension and even breathing, hold this position for 5-10 seconds. Cat-cow Repeat these steps until your lower back becomes more flexible: 1. Get into a hands-and-knees position on a firm surface. Keep your hands under your shoulders, and keep your knees under your hips. You may place padding under your knees for comfort. 2. Let your head hang down toward your chest. Contract your abdominal muscles and point your tailbone toward the floor so your lower back becomes rounded like the back of a cat. 3. Hold this position for 5 seconds. 4. Slowly lift your head, let your abdominal muscles relax and point your tailbone up toward the ceiling so your back forms a sagging arch like the back of a cow. 5. Hold this position for 5 seconds.  Press-ups Repeat these steps 5-10 times: 1. Lie on your abdomen (face-down) on the floor. 2. Place your palms near your head, about shoulder-width apart. 3. Keeping your back as relaxed as possible and keeping your hips on the  floor, slowly straighten your arms to raise the top half of your body and lift your shoulders. Do not use your back muscles to raise your upper torso. You may adjust the placement of your hands to make yourself more comfortable. 4. Hold this position for 5 seconds while you keep your back relaxed. 5. Slowly return to lying flat on the floor.  Bridges Repeat these steps 10 times: 1. Lie on your back on a firm surface. 2. Bend your knees so they are pointing toward the ceiling and your feet are flat on the floor. Your arms should be flat at your sides, next to your  body. 3. Tighten your buttocks muscles and lift your buttocks off the floor until your waist is at almost the same height as your knees. You should feel the muscles working in your buttocks and the back of your thighs. If you do not feel these muscles, slide your feet 1-2 inches farther away from your buttocks. 4. Hold this position for 3-5 seconds. 5. Slowly lower your hips to the starting position, and allow your buttocks muscles to relax completely. If this exercise is too easy, try doing it with your arms crossed over your chest. Abdominal crunches Repeat these steps 5-10 times: 1. Lie on your back on a firm bed or the floor with your legs extended. 2. Bend your knees so they are pointing toward the ceiling and your feet are flat on the floor. 3. Cross your arms over your chest. 4. Tip your chin slightly toward your chest without bending your neck. 5. Tighten your abdominal muscles and slowly raise your trunk (torso) high enough to lift your shoulder blades a tiny bit off the floor. Avoid raising your torso higher than that because it can put too much stress on your low back and does not help to strengthen your abdominal muscles. 6. Slowly return to your starting position. Back lifts Repeat these steps 5-10 times: 1. Lie on your abdomen (face-down) with your arms at your sides, and rest your forehead on the floor. 2. Tighten the muscles in your legs and your buttocks. 3. Slowly lift your chest off the floor while you keep your hips pressed to the floor. Keep the back of your head in line with the curve in your back. Your eyes should be looking at the floor. 4. Hold this position for 3-5 seconds. 5. Slowly return to your starting position. Contact a health care provider if:  Your back pain or discomfort gets much worse when you do an exercise.  Your worsening back pain or discomfort does not lessen within 2 hours after you exercise. If you have any of these problems, stop doing these  exercises right away. Do not do them again unless your health care provider says that you can. Get help right away if:  You develop sudden, severe back pain. If this happens, stop doing the exercises right away. Do not do them again unless your health care provider says that you can. This information is not intended to replace advice given to you by your health care provider. Make sure you discuss any questions you have with your health care provider. Document Released: 03/04/2004 Document Revised: 06/01/2018 Document Reviewed: 10/27/2017 Elsevier Patient Education  [place].   Calcium Content in Foods Calcium is the most abundant mineral in your body. Most of your body's calcium supply is stored in your bones and teeth. Calcium helps many parts of the body function normally, including:  Blood and  blood vessels.  Nerves.  Hormones.  Muscles.  Bones and teeth. When your calcium stores are low, you may be at risk for low bone mass, bone loss, and broken bones (fractures). When you get enough calcium, it helps to support strong bones and teeth throughout your life. Calcium is especially important for:  Children during growth spurts.  Girls during adolescence.  Women who are pregnant or breastfeeding.  Women after their menstrual cycle stops (postmenopause).  Women whose menstrual cycle has stopped due to anorexia nervosa or regular intense exercise.  People who cannot eat or digest dairy products.  Vegans. What are tips for getting more calcium? General information  Try to get most of your calcium from food. Eat foods that are high in calcium.  Some people may benefit from taking calcium supplements. Check with your health care provider or diet and nutrition specialist (dietitian) before starting any calcium supplements. Calcium supplements may interact with certain medicines. Too much calcium may cause other health problems, like constipation and kidney stones.   For the body to absorb calcium, it needs vitamin D. Sources of vitamin D include: ? Skin exposure to direct sunlight. ? Foods, such as egg yolks, liver, saltwater fish, and fortified milk. ? Vitamin D supplements. Check with your health care provider or dietitian before starting any vitamin D supplements. What foods are high in calcium?  High-calcium foods are those that contain more than 100 milligrams (mg) of calcium per serving. Fruits  Fortified orange or other fruit juice, 300 mg per 8 oz serving. Vegetables  Collard greens, 360 mg per 8 oz serving.  Kale, 180 mg per 8 oz serving.  Bok choy, 160 mg per 8 oz serving. Grains  Fortified ready-to-eat cereals, 100-1,000 mg per 8 oz serving.  Fortified frozen waffles, 200 mg in two waffles. Meats and other proteins  Sardines, canned with bones, 325 mg per 3 oz serving.  Salmon, canned with bones, 180 mg per 3 oz serving.  Canned shrimp, 125 mg per 3 oz serving.  Baked beans, 160 mg per 4 oz serving. Dairy  Yogurt, plain, low-fat, 310 mg per 6 oz serving.  Milk, 300 mg per 8 oz serving.  American cheese, 195 mg per 1 oz serving.  Cheddar cheese, 205 mg per 1 oz serving.  Cottage cheese 2%, 105 mg per 4 oz serving.  Fortified soy, rice, or almond milk, 300 mg per 8 oz serving. The items listed above may not be a complete list of foods high in calcium. Actual amounts of calcium may be different depending on processing. Contact a dietitian for more information. What foods are lower in calcium? Foods lower in calcium are those that contain 50 mg of calcium or less per serving. Fruits  Apple, about 6 mg in one apple.  Banana, about 12 mg in one banana. Vegetables  Lettuce, 19 mg per 2 oz serving.  Tomato, about 11 mg in one tomato. Grains  Rice, 4 mg per 6 oz serving.  Boiled potatoes, 14 mg per 8 oz serving.  White bread, 6 mg in one slice. Meats and other proteins  Egg, 27 mg per 2 oz serving.  Red meat,  7 mg per 4 oz serving.  Chicken, 17 mg per 4 oz serving.  Fish, cod or trout, 20 mg per 4 oz serving. The items listed above may not be a complete list of foods lower in calcium. Actual amounts of calcium may be different depending on processing. Contact a dietitian for more  information. Summary  Calcium is an important mineral in the body because it affects many functions. Getting enough calcium helps support strong bones and teeth throughout your life.  Try to get most of your calcium from food.  Calcium supplements may interact with certain medicines. Check with your health care provider before starting any calcium supplements. This information is not intended to replace advice given to you by your health care provider. Make sure you discuss any questions you have with your health care provider. Document Released: 09/09/2003 Document Revised: 01/18/2017 Document Reviewed: 01/18/2017 Elsevier Patient Education  2020 [employer].

## 2018-08-21 NOTE — Progress Notes (Signed)
Subjective  CC:  Chief Complaint  Patient presents with  . Diabetes  . Hypertension    HPI: Nicole Jimenez is a 84 y.o. female who presents to the office today for follow up of diabetes and problems listed above in the chief complaint.   Diabetes follow up: Her diabetic control is reported as Unchanged. Diet controlled; denies sxs of hyperglycemia. Exercise is occasional. Diet is good.  She denies exertional CP or SOB or symptomatic hypoglycemia. She denies foot sores or paresthesias.   HTN f/u: Feeling well. Taking medications w/o adverse effects. No symptoms of CHF, angina; no palpitations, sob, cp, + mild ankle extremity edema. Compliant with meds.   Hair loss: diffuse thinning on top of head and front. X 1-2 years. No rash but occ itching. No patchy loss.   afib on anticoagulation: doing fine. No palpitations or sob. No bleeding side effects.   Back pain due to djd. Controls with back brace and stretching. Would like more exercises to do at home  Osteopenia: deferring tx x 1-2 years. (elevated FRAX score). On ca and D supplements.   Mood is well controlled.  Wt Readings from Last 3 Encounters:  08/21/18 162 lb (73.5 kg)  02/24/18 163 lb 6.4 oz (74.1 kg)  12/02/17 163 lb 3.2 oz (74 kg)    BP Readings from Last 3 Encounters:  08/21/18 132/74  06/12/18 112/72  02/24/18 126/74    Assessment  1. Diet-controlled diabetes mellitus ([place])   2. Essential hypertension   3. Major depression, chronic   4. Anticoagulant long-term use   5. Paroxysmal atrial fibrillation (HCC)   6. Bereavement due to life event   7. Chronic diastolic congestive heart failure ([place])   8. Hair thinning   9. Osteopenia, unspecified location   10. DDD (degenerative disc disease), lumbosacral   11. Subclinical hyperthyroidism   12. History of femoral artery thrombosis - 2018      Plan   Diabetes is currently well controlled. Diet controlled. Continue same.   CHF and afib: stable  Hair loss:  refer to derm. Check thyroid again. Sees endocrine.   Back pain: doing well. Rare tylenol need. Back pain exercises given.   Osteopenia: stable.   Has f/u for h/o fem art embolism with vascular next week.   HM: due second shingrix. Will get at pharmacy.  Follow up: No follow-ups on file.. Orders Placed This Encounter  Procedures  . TSH  . T3  . T4, free  . Sedimentation rate  . Ambulatory referral to Dermatology  . POCT glycosylated hemoglobin (Hb A1C)   No orders of the defined types were placed in this encounter.     Immunization History  Administered Date(s) Administered  . Influenza Split 01/24/2012  . Influenza, High Dose Seasonal PF 12/02/2013, 01/20/2015, 12/28/2016  . Influenza,inj,Quad PF,6+ Mos 11/21/2015, 11/18/2017  . Influenza-Unspecified 01/24/2012, 01/05/2014, 01/05/2014, 01/20/2015  . Pneumococcal Conjugate-13 03/06/2015, 03/06/2015  . Pneumococcal Polysaccharide-23 06/18/2016  . Zoster 10/09/2012  . Zoster Recombinat (Shingrix) 03/09/2018    Diabetes Related Lab Review: Lab Results  Component Value Date   HGBA1C 6.5 (A) 08/21/2018   HGBA1C 6.7 (A) 02/24/2018   HGBA1C 6.5 (A) 11/18/2017    Lab Results  Component Value Date   MICROALBUR 5.6 (H) 11/18/2017   Lab Results  Component Value Date   CREATININE 1.04 02/24/2018   BUN 19 02/24/2018   NA 137 02/24/2018   K 4.0 02/24/2018   CL 101 02/24/2018   CO2 26 02/24/2018  Lab Results  Component Value Date   CHOL 156 02/24/2018   CHOL 211 (H) 11/18/2017   CHOL 167 01/24/2017   Lab Results  Component Value Date   HDL 59.60 02/24/2018   HDL 70.00 11/18/2017   HDL 65 01/24/2017   Lab Results  Component Value Date   LDLCALC 60 02/24/2018   LDLCALC 114 (H) 11/18/2017   LDLCALC 72 01/24/2017   Lab Results  Component Value Date   TRIG 181.0 (H) 02/24/2018   TRIG 134.0 11/18/2017   TRIG 204 (H) 01/24/2017   Lab Results  Component Value Date   CHOLHDL 3 02/24/2018   CHOLHDL 3  11/18/2017   CHOLHDL 2.6 01/24/2017   No results found for: LDLDIRECT The ASCVD Risk score Mikey Bussing DC Jr., et al., 2013) failed to calculate for the following reasons:   The 2013 ASCVD risk score is only valid for ages 15 to 74 I have reviewed the [place], Fam and Soc history. Patient Active Problem List   Diagnosis Date Noted  . History of femoral artery thrombosis - 2018 08/21/2018    Priority: High    Managed by vascular. 2018   . Anticoagulant long-term use 03/06/2018    Priority: High  . Paroxysmal atrial fibrillation (HCC) 01/23/2018    Priority: High  . Diastolic CHF ([place]) 53/29/9242    Priority: High    Echo 11/2016, nl EF but LVH and diastolic dysfunction    . Diet-controlled diabetes mellitus ([place]) 01/24/2017    Priority: High  . Subclinical hyperthyroidism 01/21/2014    Priority: High    Managed by endocrine   . Essential hypertension 02/23/2013    Priority: High  . Hypercholesterolemia 02/23/2013    Priority: High  . Major depression, chronic 07/18/2012    Priority: High  . DJD of right shoulder 01/24/2017    Priority: Medium  . Bereavement due to life event 03/05/2016    Priority: Medium    Her husband of 61 years died in February 03, 2016    . Osteopenia 08/23/2015    Priority: Medium    dexa 2017: T = -1.0 at femur, elevated FRAX score.  Repeat 2019 Osteopenia with elevated hip frac score of 10%:  T lowest = - 1.8 Major Osteoporotic Fracture: 17.1% Hip Fracture:                10.4% Population:                  [place] ([place]) Risk Factors:                Family Hist. (Parent hip fracture) Offer medications due to elevated hip fracture risk score.    . Idiopathic scoliosis of thoracic spine 07/09/2014    Priority: Medium  . Sjogren's syndrome ([place]) 08/04/2012    Priority: Medium  . DDD (degenerative disc disease), lumbosacral 07/15/2012    Priority: Medium  . Lumbosacral spondylosis 07/15/2012    Priority: Medium  . Osteoarthritis of knee 07/15/2012     Priority: Medium  . Bilateral edema of lower extremity 01/26/2017    Priority: Low  . Bilateral hip bursitis 07/17/2015    Priority: Low  . Age-related nuclear cataract of both eyes 03/11/2015    Priority: Low  . Insomnia 09/04/2013    Priority: Low  . Allergic rhinitis 07/15/2012    Priority: Low    Social History: Patient  reports that she has never smoked. She has never used smokeless tobacco. She reports that she does not drink  alcohol or use drugs.  Review of Systems: Ophthalmic: negative for eye pain, loss of vision or double vision Cardiovascular: negative for chest pain Respiratory: negative for SOB or persistent cough Gastrointestinal: negative for abdominal pain Genitourinary: negative for dysuria or gross hematuria MSK: negative for foot lesions Neurologic: negative for weakness or gait disturbance  Objective  Vitals: BP 132/74   Pulse 84   Temp 98.2 F (36.8 C) (Oral)   Resp 14   Ht 5\' 2"  (1.575 m)   Wt 162 lb (73.5 kg)   SpO2 96%   BMI 29.63 kg/m  General: well appearing, no acute distress  Psych:  Alert and oriented, normal mood and affect HEENT:  Normocephalic, atraumatic, moist mucous membranes, supple neck  Cardiovascular:  irreg irreg Respiratory:  Good breath sounds bilaterally, CTAB with normal effort, no rales Gastrointestinal: normal BS, soft, nontender Skin:  Warm, no rashes Neurologic:   Mental status is normal. normal gait Foot exam: no erythema, pallor, or cyanosis visible nl proprioception and sensation to monofilament testing bilaterally, +2 distal pulses bilaterally. Tr ankle edema.    Diabetic education: ongoing education regarding chronic disease management for diabetes was given today. We continue to reinforce the ABC's of diabetic management: A1c (<7 or 8 dependent upon patient), tight blood pressure control, and cholesterol management with goal LDL < 100 minimally. We discuss diet strategies, exercise recommendations, medication  options and possible side effects. At each visit, we review recommended immunizations and preventive care recommendations for diabetics and stress that good diabetic control can prevent other problems. See below for this patient's data.    Commons side effects, risks, benefits, and alternatives for medications and treatment plan prescribed today were discussed, and the patient expressed understanding of the given instructions. Patient is instructed to call or message via MyChart if he/she has any questions or concerns regarding our treatment plan. No barriers to understanding were identified. We discussed Red Flag symptoms and signs in detail. Patient expressed understanding regarding what to do in case of urgent or emergency type symptoms.   Medication list was reconciled, printed and provided to the patient in AVS. Patient instructions and summary information was reviewed with the patient as documented in the AVS. This note was prepared with assistance of Dragon voice recognition software. Occasional wrong-word or sound-a-like substitutions may have occurred due to the inherent limitations of voice recognition software

## 2018-08-22 LAB — T3: T3, Total: 109 ng/dL (ref 76–181)

## 2018-08-24 DIAGNOSIS — I739 Peripheral vascular disease, unspecified: Secondary | ICD-10-CM | POA: Diagnosis not present

## 2018-08-28 ENCOUNTER — Other Ambulatory Visit: Payer: Self-pay | Admitting: Family Medicine

## 2018-10-26 DIAGNOSIS — E785 Hyperlipidemia, unspecified: Secondary | ICD-10-CM | POA: Diagnosis not present

## 2018-10-26 DIAGNOSIS — Z7901 Long term (current) use of anticoagulants: Secondary | ICD-10-CM | POA: Diagnosis not present

## 2018-10-26 DIAGNOSIS — I480 Paroxysmal atrial fibrillation: Secondary | ICD-10-CM | POA: Diagnosis not present

## 2018-10-26 DIAGNOSIS — I10 Essential (primary) hypertension: Secondary | ICD-10-CM | POA: Diagnosis not present

## 2018-10-30 DIAGNOSIS — H52203 Unspecified astigmatism, bilateral: Secondary | ICD-10-CM | POA: Diagnosis not present

## 2018-10-30 DIAGNOSIS — Z961 Presence of intraocular lens: Secondary | ICD-10-CM | POA: Diagnosis not present

## 2018-10-30 DIAGNOSIS — H35362 Drusen (degenerative) of macula, left eye: Secondary | ICD-10-CM | POA: Diagnosis not present

## 2018-10-30 DIAGNOSIS — H5203 Hypermetropia, bilateral: Secondary | ICD-10-CM | POA: Diagnosis not present

## 2018-10-30 DIAGNOSIS — H02834 Dermatochalasis of left upper eyelid: Secondary | ICD-10-CM | POA: Diagnosis not present

## 2018-10-30 DIAGNOSIS — H524 Presbyopia: Secondary | ICD-10-CM | POA: Diagnosis not present

## 2018-10-30 DIAGNOSIS — H43813 Vitreous degeneration, bilateral: Secondary | ICD-10-CM | POA: Diagnosis not present

## 2018-10-30 DIAGNOSIS — E119 Type 2 diabetes mellitus without complications: Secondary | ICD-10-CM | POA: Diagnosis not present

## 2018-10-30 DIAGNOSIS — H02831 Dermatochalasis of right upper eyelid: Secondary | ICD-10-CM | POA: Diagnosis not present

## 2018-10-30 DIAGNOSIS — H35371 Puckering of macula, right eye: Secondary | ICD-10-CM | POA: Diagnosis not present

## 2018-10-30 DIAGNOSIS — H04123 Dry eye syndrome of bilateral lacrimal glands: Secondary | ICD-10-CM | POA: Diagnosis not present

## 2018-11-09 DIAGNOSIS — L218 Other seborrheic dermatitis: Secondary | ICD-10-CM | POA: Diagnosis not present

## 2018-11-09 DIAGNOSIS — L668 Other cicatricial alopecia: Secondary | ICD-10-CM | POA: Diagnosis not present

## 2018-11-09 DIAGNOSIS — L821 Other seborrheic keratosis: Secondary | ICD-10-CM | POA: Diagnosis not present

## 2018-11-09 DIAGNOSIS — D224 Melanocytic nevi of scalp and neck: Secondary | ICD-10-CM | POA: Diagnosis not present

## 2018-11-12 ENCOUNTER — Other Ambulatory Visit: Payer: Self-pay | Admitting: Family Medicine

## 2018-11-13 ENCOUNTER — Other Ambulatory Visit: Payer: Self-pay | Admitting: *Deleted

## 2018-11-13 MED ORDER — LISINOPRIL-HYDROCHLOROTHIAZIDE 10-12.5 MG PO TABS: 1.0000 | ORAL_TABLET | Freq: Every day | ORAL | 3 refills | Status: DC

## 2018-11-13 MED ORDER — ROSUVASTATIN CALCIUM 20 MG PO TABS: 20.0000 mg | ORAL_TABLET | Freq: Every day | ORAL | 3 refills | Status: DC

## 2018-11-14 ENCOUNTER — Telehealth: Payer: Self-pay | Admitting: Family Medicine

## 2018-11-14 NOTE — Telephone Encounter (Signed)
I left a message asking the patient to call and schedule Medicare AWV with Loma Sousa ([place]) on 11/20/2018 before seeing Dr. Jonni Sanger.  Im waiting for a call back to either confirm or decline the appointment. If patient calls back, please update appointment notes.  VDM ([place])

## 2018-11-20 ENCOUNTER — Ambulatory Visit (INDEPENDENT_AMBULATORY_CARE_PROVIDER_SITE_OTHER): Payer: Medicare Other

## 2018-11-20 ENCOUNTER — Other Ambulatory Visit: Payer: Self-pay

## 2018-11-20 ENCOUNTER — Encounter: Payer: Self-pay | Admitting: Family Medicine

## 2018-11-20 ENCOUNTER — Ambulatory Visit (INDEPENDENT_AMBULATORY_CARE_PROVIDER_SITE_OTHER): Payer: Medicare Other | Admitting: Family Medicine

## 2018-11-20 VITALS — BP 134/72 | Temp 98.1°F | Ht 62.0 in | Wt 160.8 lb

## 2018-11-20 VITALS — BP 134/72 | Temp 98.1°F | Resp 16 | Ht 62.0 in | Wt 160.8 lb

## 2018-11-20 DIAGNOSIS — I5032 Chronic diastolic (congestive) heart failure: Secondary | ICD-10-CM | POA: Diagnosis not present

## 2018-11-20 DIAGNOSIS — I480 Paroxysmal atrial fibrillation: Secondary | ICD-10-CM | POA: Diagnosis not present

## 2018-11-20 DIAGNOSIS — Z86718 Personal history of other venous thrombosis and embolism: Secondary | ICD-10-CM

## 2018-11-20 DIAGNOSIS — Z0000 Encounter for general adult medical examination without abnormal findings: Secondary | ICD-10-CM | POA: Diagnosis not present

## 2018-11-20 DIAGNOSIS — E7800 Pure hypercholesterolemia, unspecified: Secondary | ICD-10-CM

## 2018-11-20 DIAGNOSIS — I10 Essential (primary) hypertension: Secondary | ICD-10-CM

## 2018-11-20 DIAGNOSIS — E119 Type 2 diabetes mellitus without complications: Secondary | ICD-10-CM | POA: Diagnosis not present

## 2018-11-20 DIAGNOSIS — I110 Hypertensive heart disease with heart failure: Secondary | ICD-10-CM

## 2018-11-20 DIAGNOSIS — Z23 Encounter for immunization: Secondary | ICD-10-CM | POA: Diagnosis not present

## 2018-11-20 DIAGNOSIS — Z7901 Long term (current) use of anticoagulants: Secondary | ICD-10-CM

## 2018-11-20 LAB — POCT GLYCOSYLATED HEMOGLOBIN (HGB A1C): Hemoglobin A1C: 6.6 % — AB (ref 4.0–5.6)

## 2018-11-20 NOTE — Patient Instructions (Signed)
Nicole Jimenez , Thank you for taking time to come for your Medicare Wellness Visit. I appreciate your ongoing commitment to your health goals. Please review the following plan we discussed and let me know if I can assist you in the future.   Screening recommendations/referrals: Colorectal Screening: not indicated  Mammogram: last 07/13/17 Bone Density: up to date;last 02/2018  Vision and Dental Exams: Recommended annual ophthalmology exams for early detection of glaucoma and other disorders of the eye Recommended annual dental exams for proper oral hygiene  Diabetic Exams Diabetic Eye Exam: recommended yearly  Diabetic Foot Exam: completed   Vaccinations: Influenza vaccine: today  Pneumococcal vaccine: up to date; last 06/18/16 Tdap vaccine: recommended- Please call your insurance company to determine your out of pocket expense. You may also receive this vaccine at your local pharmacy or Health Dept. Shingles vaccine: Shingrix completed   Advanced directives: Please bring a copy of your POA (Power of Attorney) and/or Living Will to your next appointment.  Goals: Recommend to drink at least 6-8 8oz glasses of water per day and eat a diet rich in fresh fruits and vegetables.   Next appointment: Please schedule your Annual Wellness Visit with your Nurse Health Advisor in one year.  Preventive Care 20 Years and Older, Female Preventive care refers to lifestyle choices and visits with your health care provider that can promote health and wellness. What does preventive care include?  A yearly physical exam. This is also called an annual well check.  Dental exams once or twice a year.  Routine eye exams. Ask your health care provider how often you should have your eyes checked.  Personal lifestyle choices, including:  Daily care of your teeth and gums.  Regular physical activity.  Eating a healthy diet.  Avoiding tobacco and drug use.  Limiting alcohol use.  Practicing safe sex.   Taking low-dose aspirin every day if recommended by your health care provider.  Taking vitamin and mineral supplements as recommended by your health care provider. What happens during an annual well check? The services and screenings done by your health care provider during your annual well check will depend on your age, overall health, lifestyle risk factors, and family history of disease. Counseling  Your health care provider may ask you questions about your:  Alcohol use.  Tobacco use.  Drug use.  Emotional well-being.  Home and relationship well-being.  Sexual activity.  Eating habits.  History of falls.  Memory and ability to understand (cognition).  Work and work Statistician.  Reproductive health. Screening  You may have the following tests or measurements:  Height, weight, and BMI.  Blood pressure.  Lipid and cholesterol levels. These may be checked every 5 years, or more frequently if you are over 66 years old.  Skin check.  Lung cancer screening. You may have this screening every year starting at age 70 if you have a 30-pack-year history of smoking and currently smoke or have quit within the past 15 years.  Fecal occult blood test (FOBT) of the stool. You may have this test every year starting at age 77.  Flexible sigmoidoscopy or colonoscopy. You may have a sigmoidoscopy every 5 years or a colonoscopy every 10 years starting at age 43.  Hepatitis C blood test.  Hepatitis B blood test.  Sexually transmitted disease (STD) testing.  Diabetes screening. This is done by checking your blood sugar (glucose) after you have not eaten for a while (fasting). You may have this done every 1-3 years.  Bone density scan. This is done to screen for osteoporosis. You may have this done starting at age 45.  Mammogram. This may be done every 1-2 years. Talk to your health care provider about how often you should have regular mammograms. Talk with your health care  provider about your test results, treatment options, and if necessary, the need for more tests. Vaccines  Your health care provider may recommend certain vaccines, such as:  Influenza vaccine. This is recommended every year.  Tetanus, diphtheria, and acellular pertussis (Tdap, Td) vaccine. You may need a Td booster every 10 years.  Zoster vaccine. You may need this after age 19.  Pneumococcal 13-valent conjugate (PCV13) vaccine. One dose is recommended after age 62.  Pneumococcal polysaccharide (PPSV23) vaccine. One dose is recommended after age 52. Talk to your health care provider about which screenings and vaccines you need and how often you need them. This information is not intended to replace advice given to you by your health care provider. Make sure you discuss any questions you have with your health care provider. Document Released: 02/21/2015 Document Revised: 10/15/2015 Document Reviewed: 11/26/2014 Elsevier Interactive Patient Education  2017 [place] Prevention in the Home Falls can cause injuries. They can happen to people of all ages. There are many things you can do to make your home safe and to help prevent falls. What can I do on the outside of my home?  Regularly fix the edges of walkways and driveways and fix any cracks.  Remove anything that might make you trip as you walk through a door, such as a raised step or threshold.  Trim any bushes or trees on the path to your home.  Use bright outdoor lighting.  Clear any walking paths of anything that might make someone trip, such as rocks or tools.  Regularly check to see if handrails are loose or broken. Make sure that both sides of any steps have handrails.  Any raised decks and porches should have guardrails on the edges.  Have any leaves, snow, or ice cleared regularly.  Use sand or salt on walking paths during winter.  Clean up any spills in your garage right away. This includes oil or grease  spills. What can I do in the bathroom?  Use night lights.  Install grab bars by the toilet and in the tub and shower. Do not use towel bars as grab bars.  Use non-skid mats or decals in the tub or shower.  If you need to sit down in the shower, use a plastic, non-slip stool.  Keep the floor dry. Clean up any water that spills on the floor as soon as it happens.  Remove soap buildup in the tub or shower regularly.  Attach bath mats securely with double-sided non-slip rug tape.  Do not have throw rugs and other things on the floor that can make you trip. What can I do in the bedroom?  Use night lights.  Make sure that you have a light by your bed that is easy to reach.  Do not use any sheets or blankets that are too big for your bed. They should not hang down onto the floor.  Have a firm chair that has side arms. You can use this for support while you get dressed.  Do not have throw rugs and other things on the floor that can make you trip. What can I do in the kitchen?  Clean up any spills right away.  Avoid walking  on wet floors.  Keep items that you use a lot in easy-to-reach places.  If you need to reach something above you, use a strong step stool that has a grab bar.  Keep electrical cords out of the way.  Do not use floor polish or wax that makes floors slippery. If you must use wax, use non-skid floor wax.  Do not have throw rugs and other things on the floor that can make you trip. What can I do with my stairs?  Do not leave any items on the stairs.  Make sure that there are handrails on both sides of the stairs and use them. Fix handrails that are broken or loose. Make sure that handrails are as long as the stairways.  Check any carpeting to make sure that it is firmly attached to the stairs. Fix any carpet that is loose or worn.  Avoid having throw rugs at the top or bottom of the stairs. If you do have throw rugs, attach them to the floor with carpet  tape.  Make sure that you have a light switch at the top of the stairs and the bottom of the stairs. If you do not have them, ask someone to add them for you. What else can I do to help prevent falls?  Wear shoes that:  Do not have high heels.  Have rubber bottoms.  Are comfortable and fit you well.  Are closed at the toe. Do not wear sandals.  If you use a stepladder:  Make sure that it is fully opened. Do not climb a closed stepladder.  Make sure that both sides of the stepladder are locked into place.  Ask someone to hold it for you, if possible.  Clearly mark and make sure that you can see:  Any grab bars or handrails.  First and last steps.  Where the edge of each step is.  Use tools that help you move around (mobility aids) if they are needed. These include:  Canes.  Walkers.  Scooters.  Crutches.  Turn on the lights when you go into a dark area. Replace any light bulbs as soon as they burn out.  Set up your furniture so you have a clear path. Avoid moving your furniture around.  If any of your floors are uneven, fix them.  If there are any pets around you, be aware of where they are.  Review your medicines with your doctor. Some medicines can make you feel dizzy. This can increase your chance of falling. Ask your doctor what other things that you can do to help prevent falls. This information is not intended to replace advice given to you by your health care provider. Make sure you discuss any questions you have with your health care provider. Document Released: 11/21/2008 Document Revised: 07/03/2015 Document Reviewed: 03/01/2014 Elsevier Interactive Patient Education  2017 [employer].

## 2018-11-20 NOTE — Progress Notes (Signed)
I have reviewed the documentation from the recent AWV done by Courtney Slade, RN; I agree with the documentation and will follow up on any recommendations or abnormal findings as suggested.  

## 2018-11-20 NOTE — Progress Notes (Signed)
Subjective:   Nicole Jimenez is a 84 y.o. female who presents for Medicare Annual (Subsequent) preventive examination.  Review of Systems:   Cardiac Risk Factors include: advanced age (>47men, >59 women);diabetes mellitus;hypertension     Objective:     Vitals: BP 134/72 (BP Location: Left Arm, Patient Position: Sitting, Cuff Size: Normal)   Temp 98.1 F (36.7 C) (Temporal)   Ht 5\' 2"  (1.575 m)   Wt 160 lb 12.8 oz (72.9 kg)   BMI 29.41 kg/m   Body mass index is 29.41 kg/m.  Advanced Directives 11/20/2018 07/08/2017  Does Patient Have a Medical Advance Directive? Yes Yes  Type of Advance Directive Living will;Healthcare Power of [place];Living will  Does patient want to make changes to medical advance directive? No - Patient declined -  Copy of [place] in Chart? No - copy requested No - copy requested    Tobacco Social History   Tobacco Use  Smoking Status Never Smoker  Smokeless Tobacco Never Used     Counseling given: Not Answered   Clinical Intake:  Pre-visit preparation completed: Yes  Pain : No/denies pain  Diabetes: Yes CBG done?: No Did pt. bring in CBG monitor from home?: No  How often do you need to have someone help you when you read instructions, pamphlets, or other written materials from your doctor or pharmacy?: 2 - Rarely  Interpreter Needed?: No  Information entered by :: Denman George LPN  Past Medical History:  Diagnosis Date  . Arthritis   . Depression   . Diabetes mellitus without complication (HCC)    Borderline  . Diastolic CHF ([place]) 123456   Echo 11/2016, nl EF but LVH and diastolic dysfunction  . GERD (gastroesophageal reflux disease)   . Hyperlipidemia   . Hypertension   . Peripheral arterial disease ([place]) 2018   thromboembolism of femoral artery  . Thyroid disease    subclinical hyperthyroidism managed by endocrinology   Past Surgical History:  Procedure  Laterality Date  . FEMORAL ENDARTERECTOMY Right 11/29/2016   Family History  Problem Relation Age of Onset  . Heart attack Mother   . Arthritis Mother   . Diabetes Father   . Heart attack Father   . Hyperlipidemia Father   . Arthritis Sister   . Diabetes Sister        adopted  . Healthy Daughter   . Healthy Son   . Healthy Son   . Other Son        cyst on thyroid   Social History   Socioeconomic History  . Marital status: Widowed    Spouse name: Not on file  . Number of children: Not on file  . Years of education: Not on file  . Highest education level: Not on file  Occupational History  . Occupation: retired Arts administrator  . Financial resource strain: Not on file  . Food insecurity    Worry: Not on file    Inability: Not on file  . Transportation needs    Medical: Not on file    Non-medical: Not on file  Tobacco Use  . Smoking status: Never Smoker  . Smokeless tobacco: Never Used  Substance and Sexual Activity  . Alcohol use: No    Frequency: Never  . Drug use: No  . Sexual activity: Never  Lifestyle  . Physical activity    Days per week: Not on file    Minutes per session: Not on  file  . Stress: Not on file  Relationships  . Social Herbalist on phone: Not on file    Gets together: Not on file    Attends religious service: Not on file    Active member of club or organization: Not on file    Attends meetings of clubs or organizations: Not on file    Relationship status: Not on file  Other Topics Concern  . Not on file  Social History Narrative  . Not on file    Outpatient Encounter Medications as of 11/20/2018  Medication Sig  . Acetaminophen 500 MG coapsule Take 1,000 mg by mouth as needed.   . ACIDOPHILUS LACTOBACILLUS PO Take 1 each by mouth daily.   Marland Kitchen atenolol (TENORMIN) 50 MG tablet Take 1 tablet (50 mg total) by mouth daily.  . Calcium Carb-Cholecalciferol 600-800 MG-UNIT CHEW Chew 1 each by mouth daily.   . diclofenac  sodium (VOLTAREN) 1 % GEL APPLY 2 G TOPICALLY 4 (FOUR) TIMES DAILY AS NEEDED. **PA DENIED**  . diltiazem (CARDIZEM CD) 180 MG 24 hr capsule Take 1 capsule (180 mg total) by mouth every morning.  Marland Kitchen ELIQUIS 2.5 MG TABS tablet TAKE 1 TABLET BY MOUTH TWICE A DAY  . Glucosamine-MSM-Hyaluronic Acd (JOINT HEALTH PO) Take 1 tablet by mouth daily.  Javier Docker Oil 500 MG CAPS Take 500 mg by mouth daily.  Marland Kitchen lisinopril-hydrochlorothiazide (ZESTORETIC) 10-12.5 MG tablet Take 1 tablet by mouth daily.  . Multiple Vitamins-Minerals (MULTIVITAMIN ADULT PO) Take by mouth.  Marland Kitchen omeprazole (PRILOSEC) 20 MG capsule TAKE ONE CAPSULE BY MOUTH EVERY MORNING 30 MINUTES BEFORE BREAKFAST.  Marland Kitchen ONETOUCH ULTRA test strip USE TWICE DAILY TO CHECK BLOOD SUGAR - E11.40  . rosuvastatin (CRESTOR) 20 MG tablet Take 1 tablet (20 mg total) by mouth daily.  . TRINTELLIX 10 MG TABS tablet TAKE 1 TABLET BY MOUTH EVERY DAY   No facility-administered encounter medications on file as of 11/20/2018.     Activities of Daily Living In your present state of health, do you have any difficulty performing the following activities: 11/20/2018 02/24/2018  Hearing? N N  Vision? N Y  Difficulty concentrating or making decisions? N N  Walking or climbing stairs? N N  Dressing or bathing? N N  Doing errands, shopping? N N  Preparing Food and eating ? N -  Using the Toilet? N -  In the past six months, have you accidently leaked urine? N -  Do you have problems with loss of bowel control? N -  Managing your Medications? N -  Managing your Finances? N -  Housekeeping or managing your Housekeeping? N -  Some recent data might be hidden    Patient Care Team: Leamon Arnt, MD as PCP - General (Family Medicine) Roel Cluck, MD as Referring Physician (Ophthalmology) Alanson Aly, MD as Consulting Physician (Endocrinology) Dinah Beers, MD as Referring Physician (Vascular Surgery) Gaynelle Arabian, MD as Consulting Physician  (Orthopedic Surgery) Dermatology, [facility] as Consulting Physician    Assessment:   This is a routine wellness examination for [name].  Exercise Activities and Dietary recommendations Current Exercise Habits: The patient does not participate in regular exercise at present  Goals    . Increase physical activity     Increase activity.        Fall Risk Fall Risk  11/20/2018 02/24/2018 07/08/2017 04/14/2017 01/24/2017  Falls in the past year? 0 0 Yes No Yes  Comment - - fell on uneven pavement - -  Number falls in past yr: - 0 1 - 1  Injury with Fall? 0 0 No - No  Risk for fall due to : - - - - Impaired balance/gait  Follow up Falls evaluation completed;Education provided;Falls prevention discussed Falls evaluation completed Falls prevention discussed - -   Is the patient's home free of loose throw rugs in walkways, pet beds, electrical cords, etc?   yes      Grab bars in the bathroom? yes      Handrails on the stairs?   yes      Adequate lighting?   yes  Timed Get Up and Go performed: completed and within normal timeframe; no gait abnormalities noted    Depression Screen PHQ 2/9 Scores 11/20/2018 02/24/2018 12/02/2017 07/08/2017  PHQ - 2 Score 0 2 0 1  PHQ- 9 Score - - 0 3     Cognitive Function- no cognitive concerns at this time  MMSE - Mini Mental State Exam 11/20/2018 07/08/2017  Orientation to time 5 5  Orientation to Place 5 5  Registration 3 3  Attention/ Calculation 5 5  Recall 3 1  Language- name 2 objects 2 2  Language- repeat 1 1  Language- follow 3 step command 3 3  Language- read & follow direction 1 1  Write a sentence 1 1  Copy design 1 1  Total score 30 28      Immunization History  Administered Date(s) Administered  . Fluad Quad(high Dose 65+) 11/20/2018  . Influenza Split 01/24/2012  . Influenza, High Dose Seasonal PF 12/02/2013, 01/20/2015, 12/28/2016  . Influenza,inj,Quad PF,6+ Mos 11/21/2015, 11/18/2017  . Influenza-Unspecified 01/24/2012,  01/05/2014, 01/05/2014, 01/20/2015  . Pneumococcal Conjugate-13 03/06/2015, 03/06/2015  . Pneumococcal Polysaccharide-23 06/18/2016  . Zoster 10/09/2012  . Zoster Recombinat (Shingrix) 03/09/2018, 08/21/2018    Qualifies for Shingles Vaccine? Shingrix completed   Screening Tests Health Maintenance  Topic Date Due  . HEMOGLOBIN A1C  02/21/2019  . FOOT EXAM  02/25/2019  . OPHTHALMOLOGY EXAM  10/21/2019  . DEXA SCAN  02/10/2020  . INFLUENZA VACCINE  Completed  . PNA vac Low Risk Adult  Completed    Cancer Screenings: Lung: Low Dose CT Chest recommended if Age 36-80 years, 30 pack-year currently smoking OR have quit w/in 15years. Patient does not qualify. Breast:  Up to date on Mammogram? Yes   Up to date of Bone Density/Dexa? Yes Colorectal: not indicated     Plan:  I have personally reviewed and addressed the Medicare Annual Wellness questionnaire and have noted the following in the patient's chart:  A. Medical and social history B. Use of alcohol, tobacco or illicit drugs  C. Current medications and supplements D. Functional ability and status E.  Nutritional status F.  Physical activity G. Advance directives H. List of other physicians I.  Hospitalizations, surgeries, and ER visits in previous 12 months J.  Jay such as hearing and vision if needed, cognitive and depression L. Referrals, records requested, and appointments- none   In addition, I have reviewed and discussed with patient certain preventive protocols, quality metrics, and best practice recommendations. A written personalized care plan for preventive services as well as general preventive health recommendations were provided to patient.   Signed,  Denman George, LPN  Nurse Health Advisor   Nurse Notes: no additional

## 2018-11-20 NOTE — Progress Notes (Signed)
Subjective  CC:  Chief Complaint  Patient presents with  . Hypertension  . Diabetes    HPI: Nicole Jimenez is a 84 y.o. female who presents to the office today for follow up of diabetes and problems listed above in the chief complaint.   Diabetes follow up: Her diabetic control is reported as Unchanged. Diet controlled and feels well. Stable weight. Stable diet. No sxs of high blood sugars.  She denies exertional CP or SOB or symptomatic hypoglycemia. She denies foot sores or significant  paresthesias. She does have a little numbness in the left foot intermittently. It is not painful. No sores.   Had f/u with vascular with nl dopplers. She has been released.   Cards: well controlled HTN and no chest pain. Lipids are at goal . No Afib; on eloquis. Aspirin now stopped. No sxs of chf.   seb dermatitis of scalp: saw derm. Treated. Likely cause of hair thinning due to blocked poors, pt reports.   Wt Readings from Last 3 Encounters:  11/20/18 160 lb 12.8 oz (72.9 kg)  11/20/18 160 lb 12.8 oz (72.9 kg)  08/21/18 162 lb (73.5 kg)    BP Readings from Last 3 Encounters:  11/20/18 134/72  11/20/18 134/72  08/21/18 132/74    Assessment  1. Diet-controlled diabetes mellitus ([place])   2. History of femoral artery thrombosis - 2018   3. Anticoagulant long-term use   4. Paroxysmal atrial fibrillation (HCC)   5. Essential hypertension   6. Hypercholesterolemia   7. Chronic diastolic congestive heart failure ([place])      Plan   Diabetes is currently very well controlled. Diet controlled, imms up to date. Eye exam up to date. On statin for lipid control  BP and chf: stable.   Long term eloquis: PAF and h/o thrombus  seb derm: per derm.   Mood is good.   Follow up: Return in about 3 months (around 02/20/2019) for complete physical, follow up of diabetes and hypertension, AWV at patient's convenience.. Orders Placed This Encounter  Procedures  . POCT glycosylated hemoglobin (Hb A1C)    No orders of the defined types were placed in this encounter.     Immunization History  Administered Date(s) Administered  . Fluad Quad(high Dose 65+) 11/20/2018  . Influenza Split 01/24/2012  . Influenza, High Dose Seasonal PF 12/02/2013, 01/20/2015, 12/28/2016  . Influenza,inj,Quad PF,6+ Mos 11/21/2015, 11/18/2017  . Influenza-Unspecified 01/24/2012, 01/05/2014, 01/05/2014, 01/20/2015  . Pneumococcal Conjugate-13 03/06/2015, 03/06/2015  . Pneumococcal Polysaccharide-23 06/18/2016  . Zoster 10/09/2012  . Zoster Recombinat (Shingrix) 03/09/2018, 08/21/2018    Diabetes Related Lab Review: Lab Results  Component Value Date   HGBA1C 6.6 (A) 11/20/2018   HGBA1C 6.5 (A) 08/21/2018   HGBA1C 6.7 (A) 02/24/2018    Lab Results  Component Value Date   MICROALBUR 5.6 (H) 11/18/2017   Lab Results  Component Value Date   CREATININE 1.04 02/24/2018   BUN 19 02/24/2018   NA 137 02/24/2018   K 4.0 02/24/2018   CL 101 02/24/2018   CO2 26 02/24/2018   Lab Results  Component Value Date   CHOL 156 02/24/2018   CHOL 211 (H) 11/18/2017   CHOL 167 01/24/2017   Lab Results  Component Value Date   HDL 59.60 02/24/2018   HDL 70.00 11/18/2017   HDL 65 01/24/2017   Lab Results  Component Value Date   LDLCALC 60 02/24/2018   LDLCALC 114 (H) 11/18/2017   LDLCALC 72 01/24/2017   Lab Results  Component Value Date   TRIG 181.0 (H) 02/24/2018   TRIG 134.0 11/18/2017   TRIG 204 (H) 01/24/2017   Lab Results  Component Value Date   CHOLHDL 3 02/24/2018   CHOLHDL 3 11/18/2017   CHOLHDL 2.6 01/24/2017   No results found for: LDLDIRECT The ASCVD Risk score Mikey Bussing DC Jr., et al., 2013) failed to calculate for the following reasons:   The 2013 ASCVD risk score is only valid for ages 63 to 37 I have reviewed the [place], Fam and Soc history. Patient Active Problem List   Diagnosis Date Noted  . History of femoral artery thrombosis - 2018 08/21/2018    Priority: High    Managed by  vascular. 2018   . Anticoagulant long-term use 03/06/2018    Priority: High  . Paroxysmal atrial fibrillation (HCC) 01/23/2018    Priority: High  . Diastolic CHF ([place]) [id number]    Priority: High    Echo 11/2016, nl EF but LVH and diastolic dysfunction    . Diet-controlled diabetes mellitus ([place]) 01/24/2017    Priority: High  . Subclinical hyperthyroidism 01/21/2014    Priority: High    Managed by endocrine   . Essential hypertension 02/23/2013    Priority: High  . Hypercholesterolemia 02/23/2013    Priority: High  . Major depression, chronic 07/18/2012    Priority: High  . DJD of right shoulder 01/24/2017    Priority: Medium  . Bereavement due to life event 03/05/2016    Priority: Medium    Her husband of 49 years died in 07-Feb-2016    . Osteopenia 08/23/2015    Priority: Medium    dexa 2017: T = -1.0 at femur, elevated FRAX score.  Repeat 2019 Osteopenia with elevated hip frac score of 10%:  T lowest = - 1.8 Major Osteoporotic Fracture: 17.1% Hip Fracture:                10.4% Population:                  [place] ([place]) Risk Factors:                Family Hist. (Parent hip fracture) Offer medications due to elevated hip fracture risk score.    . Idiopathic scoliosis of thoracic spine 07/09/2014    Priority: Medium  . Sjogren's syndrome ([place]) 08/04/2012    Priority: Medium  . DDD (degenerative disc disease), lumbosacral 07/15/2012    Priority: Medium  . Lumbosacral spondylosis 07/15/2012    Priority: Medium  . Osteoarthritis of knee 07/15/2012    Priority: Medium  . Bilateral edema of lower extremity 01/26/2017    Priority: Low  . Bilateral hip bursitis 07/17/2015    Priority: Low  . Age-related nuclear cataract of both eyes 03/11/2015    Priority: Low  . Insomnia 09/04/2013    Priority: Low  . Allergic rhinitis 07/15/2012    Priority: Low    Social History: Patient  reports that she has never smoked. She has never used smokeless tobacco. She reports that  she does not drink alcohol or use drugs.  Review of Systems: Ophthalmic: negative for eye pain, loss of vision or double vision Cardiovascular: negative for chest pain Respiratory: negative for SOB or persistent cough Gastrointestinal: negative for abdominal pain Genitourinary: negative for dysuria or gross hematuria MSK: negative for foot lesions, + trigger finger left middle finger Neurologic: negative for weakness or gait disturbance  Objective  Vitals: BP 134/72   Temp 98.1 F (36.7 C) (  Tympanic)   Resp 16   Ht 5\' 2"  (1.575 m)   Wt 160 lb 12.8 oz (72.9 kg)   BMI 29.41 kg/m  General: well appearing, no acute distress  Psych:  Alert and oriented, normal mood and affect, happy today HEENT:  Normocephalic, atraumatic, moist mucous membranes, supple neck  Cardiovascular:  Nl S1 and S2, RRR without murmur, gallop or rub. no edema Respiratory:  Good breath sounds bilaterally, CTAB with normal effort, no rales Gastrointestinal: normal BS, soft, nontender Skin:  Warm, no rashes Neurologic:   Mental status is normal. normal gait    Diabetic education: ongoing education regarding chronic disease management for diabetes was given today. We continue to reinforce the ABC's of diabetic management: A1c (<7 or 8 dependent upon patient), tight blood pressure control, and cholesterol management with goal LDL < 100 minimally. We discuss diet strategies, exercise recommendations, medication options and possible side effects. At each visit, we review recommended immunizations and preventive care recommendations for diabetics and stress that good diabetic control can prevent other problems. See below for this patient's data.    Commons side effects, risks, benefits, and alternatives for medications and treatment plan prescribed today were discussed, and the patient expressed understanding of the given instructions. Patient is instructed to call or message via MyChart if he/she has any questions or  concerns regarding our treatment plan. No barriers to understanding were identified. We discussed Red Flag symptoms and signs in detail. Patient expressed understanding regarding what to do in case of urgent or emergency type symptoms.   Medication list was reconciled, printed and provided to the patient in AVS. Patient instructions and summary information was reviewed with the patient as documented in the AVS. This note was prepared with assistance of Dragon voice recognition software. Occasional wrong-word or sound-a-like substitutions may have occurred due to the inherent limitations of voice recognition software

## 2018-11-20 NOTE — Patient Instructions (Addendum)
Please return in 3 months  for your annual complete physical; please come fasting. Medicare recommends an Annual Wellness Visit for all patients. Please schedule this to be done with our Nurse Educator, Loma Sousa. This is an informative "talk" visit; it's goals are to ensure that your health care needs are being met and to give you education regarding avoiding falls, ensuring you are not suffering from depression or problems with memory or thinking, and to educate you on Advance Care Planning. It helps me take good care of you!  Glad you are well. Enjoy the Holiday season.   If you have any questions or concerns, please don't hesitate to send me a message via MyChart or call the office at [phone]. Thank you for visiting with [place] today! It's our pleasure caring for you.

## 2019-01-09 DIAGNOSIS — L661 Lichen planopilaris: Secondary | ICD-10-CM | POA: Diagnosis not present

## 2019-01-09 DIAGNOSIS — L668 Other cicatricial alopecia: Secondary | ICD-10-CM | POA: Diagnosis not present

## 2019-01-13 ENCOUNTER — Other Ambulatory Visit: Payer: Self-pay | Admitting: Family Medicine

## 2019-01-22 ENCOUNTER — Encounter: Payer: Self-pay | Admitting: Family Medicine

## 2019-02-27 ENCOUNTER — Ambulatory Visit (INDEPENDENT_AMBULATORY_CARE_PROVIDER_SITE_OTHER): Payer: Medicare Other | Admitting: Family Medicine

## 2019-02-27 ENCOUNTER — Other Ambulatory Visit: Payer: Self-pay

## 2019-02-27 ENCOUNTER — Encounter: Payer: Self-pay | Admitting: Family Medicine

## 2019-02-27 VITALS — BP 136/88 | HR 82 | Temp 97.5°F | Ht 62.0 in | Wt 158.4 lb

## 2019-02-27 DIAGNOSIS — Z7901 Long term (current) use of anticoagulants: Secondary | ICD-10-CM

## 2019-02-27 DIAGNOSIS — Z86718 Personal history of other venous thrombosis and embolism: Secondary | ICD-10-CM

## 2019-02-27 DIAGNOSIS — M7062 Trochanteric bursitis, left hip: Secondary | ICD-10-CM

## 2019-02-27 DIAGNOSIS — I10 Essential (primary) hypertension: Secondary | ICD-10-CM | POA: Diagnosis not present

## 2019-02-27 DIAGNOSIS — E0590 Thyrotoxicosis, unspecified without thyrotoxic crisis or storm: Secondary | ICD-10-CM

## 2019-02-27 DIAGNOSIS — M8580 Other specified disorders of bone density and structure, unspecified site: Secondary | ICD-10-CM | POA: Diagnosis not present

## 2019-02-27 DIAGNOSIS — I480 Paroxysmal atrial fibrillation: Secondary | ICD-10-CM

## 2019-02-27 DIAGNOSIS — E7800 Pure hypercholesterolemia, unspecified: Secondary | ICD-10-CM

## 2019-02-27 DIAGNOSIS — E119 Type 2 diabetes mellitus without complications: Secondary | ICD-10-CM

## 2019-02-27 DIAGNOSIS — I5032 Chronic diastolic (congestive) heart failure: Secondary | ICD-10-CM

## 2019-02-27 DIAGNOSIS — F329 Major depressive disorder, single episode, unspecified: Secondary | ICD-10-CM

## 2019-02-27 LAB — LIPID PANEL
Cholesterol: 163 mg/dL (ref 0–200)
HDL: 68.7 mg/dL (ref >39.00)
LDL Cholesterol: 67 mg/dL (ref 0–99)
NonHDL: 94.19
Total CHOL/HDL Ratio: 2
Triglycerides: 135 mg/dL (ref 0.0–149.0)
VLDL: 27 mg/dL (ref 0.0–40.0)

## 2019-02-27 LAB — CBC WITH DIFFERENTIAL/PLATELET
Basophils Absolute: 0 10*3/uL (ref 0.0–0.1)
Basophils Relative: 0.5 % (ref 0.0–3.0)
Eosinophils Absolute: 0.1 10*3/uL (ref 0.0–0.7)
Eosinophils Relative: 1.6 % (ref 0.0–5.0)
HCT: 43.5 % (ref 36.0–46.0)
Hemoglobin: 14.5 g/dL (ref 12.0–15.0)
Lymphocytes Relative: 23 % (ref 12.0–46.0)
Lymphs Abs: 1.3 10*3/uL (ref 0.7–4.0)
MCHC: 33.3 g/dL (ref 30.0–36.0)
MCV: 89.6 fl (ref 78.0–100.0)
Monocytes Absolute: 0.5 10*3/uL (ref 0.1–1.0)
Monocytes Relative: 8.1 % (ref 3.0–12.0)
Neutro Abs: 3.7 10*3/uL (ref 1.4–7.7)
Neutrophils Relative %: 66.8 % (ref 43.0–77.0)
Platelets: 150 10*3/uL (ref 150.0–400.0)
RBC: 4.85 Mil/uL (ref 3.87–5.11)
RDW: 13.8 % (ref 11.5–15.5)
WBC: 5.6 10*3/uL (ref 4.0–10.5)

## 2019-02-27 LAB — COMPREHENSIVE METABOLIC PANEL
ALT: 22 U/L (ref 0–35)
AST: 24 U/L (ref 0–37)
Albumin: 4.3 g/dL (ref 3.5–5.2)
Alkaline Phosphatase: 72 U/L (ref 39–117)
BUN: 17 mg/dL (ref 6–23)
CO2: 32 mEq/L (ref 19–32)
Calcium: 9.5 mg/dL (ref 8.4–10.5)
Chloride: 98 mEq/L (ref 96–112)
Creatinine, Ser: 1.06 mg/dL (ref 0.40–1.20)
GFR: 49.6 mL/min — ABNORMAL LOW (ref >60.00)
Glucose, Bld: 152 mg/dL — ABNORMAL HIGH (ref 70–99)
Potassium: 3.4 mEq/L — ABNORMAL LOW (ref 3.5–5.1)
Sodium: 140 mEq/L (ref 135–145)
Total Bilirubin: 0.9 mg/dL (ref 0.2–1.2)
Total Protein: 7.7 g/dL (ref 6.0–8.3)

## 2019-02-27 LAB — T4, FREE: Free T4: 1.03 ng/dL (ref 0.60–1.60)

## 2019-02-27 LAB — TSH: TSH: 0.61 u[IU]/mL (ref 0.35–4.50)

## 2019-02-27 LAB — T3: T3, Total: 125 ng/dL (ref 76–181)

## 2019-02-27 MED ORDER — DILTIAZEM HCL ER COATED BEADS 180 MG PO CP24: 180.0000 mg | ORAL_CAPSULE | Freq: Every morning | ORAL | 3 refills | Status: DC

## 2019-02-27 NOTE — Patient Instructions (Signed)
Please return in 3 months for diabetes follow up  I will release your lab results to you on your MyChart account with further instructions. Please reply with any questions.   If you have any questions or concerns, please don't hesitate to send me a message via MyChart or call the office at [phone]. Thank you for visiting with [place] today! It's our pleasure caring for you.  You had a steroid injection today.   Things to be aware of after this injection are listed below:  You may experience no significant improvement or even a slight worsening in your symptoms during the first 24 to 48 hours.  After that we expect your symptoms to improve gradually over the next 2 weeks for the medicine to have its maximal effect.  You should continue to have improvement out to 6 weeks after your injection.  I recommend icing the site of the injection for 20 minutes  1-2 times the day of your injection  You may shower but no swimming, tub bath or Jacuzzi for 24 hours.  If your bandage falls off this does not need to be replaced.  It is appropriate to remove the bandage after 4 hours.  You may resume light activities as tolerated.     POSSIBLE PROCEDURE SIDE EFFECTS: The side effects of the injection are usually fairly minimal however if you may experience some of the following side effects that are usually self-limited and will is off on their own.  If you are concerned please feel free to call the office with questions:             Increased numbness or tingling             Nausea or vomiting             Swelling or bruising at the injection site    Please call our office if if you experience any of the following symptoms over the next week as these can be signs of infection:              Fever greater than 100.74F             Significant swelling at the injection site             Significant redness or drainage from the injection site     Hip Bursitis  Hip bursitis is inflammation of a  fluid-filled sac (bursa) in the hip joint. The bursa prevents the bones in the hip joint from rubbing against each other. Hip bursitis can cause mild to moderate pain, and symptoms often come and go over time. What are the causes? This condition may be caused by:  Injury to the hip.  Overuse of the muscles that surround the hip joint.  Previous injury or surgery of the hip.  Arthritis or gout.  Diabetes.  Thyroid disease.  Infection. In some cases, the cause may not be known. What are the signs or symptoms? Symptoms of this condition include:  Mild or moderate pain in the hip area. Pain may get worse with movement.  Tenderness and swelling of the hip, especially on the outer side of the hip.  In rare cases, the bursa may become infected. This may cause a fever, as well as warmth and redness in the area. Symptoms may come and go. How is this diagnosed? This condition may be diagnosed based on:  A physical exam.  Your medical history.  X-rays.  Removal of fluid from your inflamed  bursa for testing (biopsy). You may be sent to a health care provider who specializes in bone diseases (orthopedist) or a provider who specializes in joint inflammation (rheumatologist). How is this treated? This condition is treated by resting, icing, applying pressure (compression), and raising (elevating) the injured area. This is called RICE treatment. In some cases, this may be enough to make your symptoms go away. Treatment may also include:  Using crutches.  Draining fluid out of the bursa to help relieve swelling.  Injecting medicine that helps to reduce inflammation (cortisone).  Additional medicines if the bursa is infected. Follow these instructions at home: Managing pain, stiffness, and swelling   If directed, put ice on the painful area. ? Put ice in a plastic bag. ? Place a towel between your skin and the bag. ? Leave the ice on for 20 minutes, 2-3 times a day. ? Raise  (elevate) your hip above the level of your heart as much as you can without pain. To do this, try putting a pillow under your hips while you lie down. Activity  Return to your normal activities as told by your health care provider. Ask your health care provider what activities are safe for you.  Rest and protect your hip as much as possible until your pain and swelling get better. General instructions  Take over-the-counter and prescription medicines only as told by your health care provider.  Wear compression wraps only as told by your health care provider.  Do not use your hip to support your body weight until your health care provider says that you can. Use crutches as told by your health care provider.  Gently massage and stretch your injured area as often as is comfortable.  Keep all follow-up visits as told by your health care provider. This is important. How is this prevented?  Exercise regularly, as told by your health care provider.  Warm up and stretch before being active.  Cool down and stretch after being active.  If an activity irritates your hip or causes pain, avoid the activity as much as possible.  Avoid sitting down for long periods at a time. Contact a health care provider if you:  Have a fever.  Develop new symptoms.  Have difficulty walking or doing everyday activities.  Have pain that gets worse or does not get better with medicine.  Develop red skin or a feeling of warmth in your hip area. Get help right away if you:  Cannot move your hip.  Have severe pain. Summary  Hip bursitis is inflammation of a fluid-filled sac (bursa) in the hip joint.  Hip bursitis can cause mild to moderate pain, and symptoms often come and go over time.  This condition is treated with rest, ice, compression, elevation, and medicines. This information is not intended to replace advice given to you by your health care provider. Make sure you discuss any questions you  have with your health care provider. Document Revised: 10/03/2017 Document Reviewed: 10/03/2017 Elsevier Patient Education  [place].   Hip Pain The hip is the joint between the upper legs and the lower pelvis. The bones, cartilage, tendons, and muscles of your hip joint support your body and allow you to move around. Hip pain can range from a minor ache to severe pain in one or both of your hips. The pain may be felt on the inside of the hip joint near the groin, or on the outside near the buttocks and upper thigh. You may also  have swelling or stiffness in your hip area. Follow these instructions at home: Managing pain, stiffness, and swelling      If directed, put ice on the painful area. To do this: ? Put ice in a plastic bag. ? Place a towel between your skin and the bag. ? Leave the ice on for 20 minutes, 2-3 times a day.  If directed, apply heat to the affected area as often as told by your health care provider. Use the heat source that your health care provider recommends, such as a moist heat pack or a heating pad. ? Place a towel between your skin and the heat source. ? Leave the heat on for 20-30 minutes. ? Remove the heat if your skin turns bright red. This is especially important if you are unable to feel pain, heat, or cold. You may have a greater risk of getting burned. Activity  Do exercises as told by your health care provider.  Avoid activities that cause pain. General instructions   Take over-the-counter and prescription medicines only as told by your health care provider.  Keep a journal of your symptoms. Write down: ? How often you have hip pain. ? The location of your pain. ? What the pain feels like. ? What makes the pain worse.  Sleep with a pillow between your legs on your most comfortable side.  Keep all follow-up visits as told by your health care provider. This is important. Contact a health care provider if:  You cannot put weight on  your leg.  Your pain or swelling continues or gets worse after one week.  It gets harder to walk.  You have a fever. Get help right away if:  You fall.  You have a sudden increase in pain and swelling in your hip.  Your hip is red or swollen or very tender to touch. Summary  Hip pain can range from a minor ache to severe pain in one or both of your hips.  The pain may be felt on the inside of the hip joint near the groin, or on the outside near the buttocks and upper thigh.  Avoid activities that cause pain.  Write down how often you have hip pain, the location of the pain, what makes it worse, and what it feels like. This information is not intended to replace advice given to you by your health care provider. Make sure you discuss any questions you have with your health care provider. Document Revised: 06/12/2018 Document Reviewed: 06/12/2018 Elsevier Patient Education  [place].

## 2019-02-27 NOTE — Progress Notes (Signed)
Subjective  Chief Complaint  Patient presents with  . Annual Exam  . Diabetes  . Hypertension    HPI: Nicole Jimenez is a 85 y.o. female who presents to [facility] today for a Female Wellness Visit. She also has the concerns and/or needs as listed above in the chief complaint. These will be addressed in addition to the Health Maintenance Visit.   Wellness Visit: annual visit with health maintenance review and exam without Pap   HM: screens are up to date. Doing well at home. AWV reviewed from October.  Chronic disease f/u and/or acute problem visit: (deemed necessary to be done in addition to the wellness visit):  Diabetes follow up: diet controlled. Her diabetic control is reported as Unchanged. Diet is stable. Weight is stable. No sxs of hyperglycemia.  She denies exertional CP or SOB or symptomatic hypoglycemia. She denies foot sores or paresthesias.  Chronic problems are all reviewed and discussed: depression is doing better. Remains on trintellix. HTN is controlled on medications: Feeling well. Taking medications w/o adverse effects. No symptoms of CHF, angina; no palpitations, sob, cp or lower extremity edema. Compliant with meds.   She has been cleared by vascular and remains on blood thinners for paf; asymtpomatic.   Subclinical hyperthyroidism w/o sxs of overt hyperthyroidism.   Chf; euvolemic  C/o left hip pain and back pain. + lumbar OA. No radicular sxs. Hurts to lie on left side at night. No knee pain. No leg swelling now.   Immunization History  Administered Date(s) Administered  . Fluad Quad(high Dose 65+) 11/20/2018  . Influenza Split 01/24/2012  . Influenza, High Dose Seasonal PF 12/02/2013, 01/20/2015, 12/28/2016  . Influenza,inj,Quad PF,6+ Mos 11/21/2015, 11/18/2017  . Influenza-Unspecified 01/24/2012, 01/05/2014, 01/05/2014, 01/20/2015  . Pneumococcal Conjugate-13 03/06/2015, 03/06/2015  . Pneumococcal Polysaccharide-23  06/18/2016  . Zoster 10/09/2012  . Zoster Recombinat (Shingrix) 03/09/2018, 08/21/2018    Diabetes Related Lab Review: Lab Results  Component Value Date   HGBA1C 6.6 (A) 11/20/2018   HGBA1C 6.5 (A) 08/21/2018   HGBA1C 6.7 (A) 02/24/2018    Lab Results  Component Value Date   MICROALBUR 5.6 (H) 11/18/2017   Lab Results  Component Value Date   CREATININE 1.04 02/24/2018   BUN 19 02/24/2018   NA 137 02/24/2018   K 4.0 02/24/2018   CL 101 02/24/2018   CO2 26 02/24/2018   Lab Results  Component Value Date   CHOL 156 02/24/2018   CHOL 211 (H) 11/18/2017   CHOL 167 01/24/2017   Lab Results  Component Value Date   HDL 59.60 02/24/2018   HDL 70.00 11/18/2017   HDL 65 01/24/2017   Lab Results  Component Value Date   LDLCALC 60 02/24/2018   LDLCALC 114 (H) 11/18/2017   LDLCALC 72 01/24/2017   Lab Results  Component Value Date   TRIG 181.0 (H) 02/24/2018   TRIG 134.0 11/18/2017   TRIG 204 (H) 01/24/2017   Lab Results  Component Value Date   CHOLHDL 3 02/24/2018   CHOLHDL 3 11/18/2017   CHOLHDL 2.6 01/24/2017   No results found for: LDLDIRECT The ASCVD Risk score Mikey Bussing DC Jr., et al., 2013) failed to calculate for the following reasons:   The 2013 ASCVD risk score is only valid for ages 56 to 65  BP Readings from Last 3 Encounters:  02/27/19 136/88  11/20/18 134/72  11/20/18 134/72   Wt Readings from Last 3 Encounters:  02/27/19 158 lb 6.4 oz (71.8 kg)  11/20/18 160 lb 12.8 oz (72.9 kg)  11/20/18 160 lb 12.8 oz (72.9 kg)    Health Maintenance  Topic Date Due  . FOOT EXAM  02/25/2019  . HEMOGLOBIN A1C  05/21/2019  . OPHTHALMOLOGY EXAM  10/21/2019  . DEXA SCAN  02/10/2020  . INFLUENZA VACCINE  Completed  . PNA vac Low Risk Adult  Completed     Assessment  1. Essential hypertension   2. Chronic diastolic congestive heart failure ([place])   3. Diet-controlled diabetes mellitus ([place])   4. History of femoral artery thrombosis - 2018   5. Anticoagulant  long-term use   6. Paroxysmal atrial fibrillation (HCC)   7. Subclinical hyperthyroidism   8. Hypercholesterolemia   9. Major depression, chronic   10. Osteopenia, unspecified location   11. Greater trochanteric bursitis of left hip      Plan  Female Wellness Visit:  Age appropriate Health Maintenance and Prevention measures were discussed with patient. Included topics are cancer screening recommendations, ways to keep healthy (see AVS) including dietary and exercise recommendations, regular eye and dental care, use of seat belts, and avoidance of moderate alcohol use and tobacco use.   BMI: discussed patient's BMI and encouraged positive lifestyle modifications to help get to or maintain a target BMI.  HM needs and immunizations were addressed and ordered. See below for orders. See HM and immunization section for updates.  Routine labs and screening tests ordered including cmp, cbc and lipids where appropriate.  Discussed recommendations regarding Vit D and calcium supplementation (see AVS)  Chronic disease management visit and/or acute problem visit:  Chronic problems are stable. Check labs. Continue same meds  Hip bursitis: educated. Start stretching. S/p steroid injection today.  Follow up: Return in about 3 months (around 05/28/2019) for follow up of diabetes and hypertension.  Orders Placed This Encounter  Procedures  . CBC with Differential/Platelet  . Comprehensive metabolic panel  . Lipid panel  . T4, free  . TSH  . T3   Meds ordered this encounter  Medications  . diltiazem (CARDIZEM CD) 180 MG 24 hr capsule    Sig: Take 1 capsule (180 mg total) by mouth every morning.    Dispense:  90 capsule    Refill:  3      Lifestyle: Body mass index is 28.97 kg/m. Wt Readings from Last 3 Encounters:  02/27/19 158 lb 6.4 oz (71.8 kg)  11/20/18 160 lb 12.8 oz (72.9 kg)  11/20/18 160 lb 12.8 oz (72.9 kg)     Patient Active Problem List   Diagnosis Date Noted  .  History of femoral artery thrombosis - 2018 08/21/2018    Priority: High    Managed by vascular. 2018   . Anticoagulant long-term use 03/06/2018    Priority: High  . Paroxysmal atrial fibrillation (HCC) 01/23/2018    Priority: High  . Diastolic CHF ([place]) A999333    Priority: High    Echo 11/2016, nl EF but LVH and diastolic dysfunction    . Diet-controlled diabetes mellitus ([place]) 01/24/2017    Priority: High  . Subclinical hyperthyroidism 01/21/2014    Priority: High    Managed by endocrine   . Essential hypertension 02/23/2013    Priority: High  . Hypercholesterolemia 02/23/2013    Priority: High  . Major depression, chronic 07/18/2012    Priority: High  . DJD of right shoulder 01/24/2017    Priority: Medium  . Bereavement due to life event 03/05/2016    Priority: Medium  Her husband of 32 years died in 02-20-16    . Osteopenia 08/23/2015    Priority: Medium    dexa 2017: T = -1.0 at femur, elevated FRAX score.  Repeat 2019 Osteopenia with elevated hip frac score of 10%:  T lowest = - 1.8 Major Osteoporotic Fracture: 17.1% Hip Fracture:                10.4% Population:                  [place] ([place]) Risk Factors:                Family Hist. (Parent hip fracture) Offer medications due to elevated hip fracture risk score.    . Idiopathic scoliosis of thoracic spine 07/09/2014    Priority: Medium  . Sjogren's syndrome ([place]) 08/04/2012    Priority: Medium  . DDD (degenerative disc disease), lumbosacral 07/15/2012    Priority: Medium  . Lumbosacral spondylosis 07/15/2012    Priority: Medium  . Osteoarthritis of knee 07/15/2012    Priority: Medium  . Bilateral edema of lower extremity 01/26/2017    Priority: Low  . Bilateral hip bursitis 07/17/2015    Priority: Low  . Age-related nuclear cataract of both eyes 03/11/2015    Priority: Low  . Insomnia 09/04/2013    Priority: Low  . Allergic rhinitis 07/15/2012    Priority: Low   Health Maintenance  Topic  Date Due  . FOOT EXAM  02/25/2019  . HEMOGLOBIN A1C  05/21/2019  . OPHTHALMOLOGY EXAM  10/21/2019  . DEXA SCAN  02/10/2020  . INFLUENZA VACCINE  Completed  . PNA vac Low Risk Adult  Completed   Immunization History  Administered Date(s) Administered  . Fluad Quad(high Dose 65+) 11/20/2018  . Influenza Split 01/24/2012  . Influenza, High Dose Seasonal PF 12/02/2013, 01/20/2015, 12/28/2016  . Influenza,inj,Quad PF,6+ Mos 11/21/2015, 11/18/2017  . Influenza-Unspecified 01/24/2012, 01/05/2014, 01/05/2014, 01/20/2015  . Pneumococcal Conjugate-13 03/06/2015, 03/06/2015  . Pneumococcal Polysaccharide-23 06/18/2016  . Zoster 10/09/2012  . Zoster Recombinat (Shingrix) 03/09/2018, 08/21/2018   We updated and reviewed the patient's past history in detail and it is documented below. Allergies: Patient is allergic to amoxicillin-pot clavulanate; amoxicillin; and clarithromycin. Past Medical History Patient  has a past medical history of Arthritis, Depression, Diabetes mellitus without complication ([place]), Diastolic CHF ([place]) (123456), GERD (gastroesophageal reflux disease), Hyperlipidemia, Hypertension, Peripheral arterial disease ([place]) (2018), and Thyroid disease. Past Surgical History Patient  has a past surgical history that includes Femoral endarterectomy (Right, 11/29/2016). Family History: Patient family history includes Arthritis in her mother and sister; Diabetes in her father and sister; Healthy in her daughter, son, and son; Heart attack in her father and mother; Hyperlipidemia in her father; Other in her son. Social History:  Patient  reports that she has never smoked. She has never used smokeless tobacco. She reports that she does not drink alcohol or use drugs.  Review of Systems: Constitutional: negative for fever or malaise Ophthalmic: negative for photophobia, double vision or loss of vision Cardiovascular: negative for chest pain, dyspnea on exertion, or new LE  swelling Respiratory: negative for SOB or persistent cough Gastrointestinal: negative for abdominal pain, change in bowel habits or melena Genitourinary: negative for dysuria or gross hematuria, no abnormal uterine bleeding or disharge Integumentary: negative for new or persistent rashes, no breast lumps Neurological: negative for TIA or stroke symptoms Psychiatric: negative for SI or delusions Allergic/Immunologic: negative for hives  Patient Care Team    Relationship  Specialty Notifications Start End  Leamon Arnt, MD PCP - General Family Medicine  01/24/17   Roel Cluck, MD Referring Physician Ophthalmology  01/24/17   Alanson Aly, MD Consulting Physician Endocrinology  01/24/17   Dinah Beers, MD Referring Physician Vascular Surgery  01/24/17   Gaynelle Arabian, MD Consulting Physician Orthopedic Surgery  07/08/17   Dermatology, [place] Physician   11/20/18     Objective  Vitals: BP 136/88 (BP Location: Left Arm, Patient Position: Sitting, Cuff Size: Normal)   Pulse 82   Temp (!) 97.5 F (36.4 C) (Temporal)   Ht 5\' 2"  (1.575 m)   Wt 158 lb 6.4 oz (71.8 kg)   SpO2 94%   BMI 28.97 kg/m  General:  Well developed, well nourished, no acute distress  Psych:  Alert and orientedx3,normal mood and affect HEENT:  Normocephalic, atraumatic, non-icteric sclera, PERRL, oropharynx is clear without mass or exudate, supple neck without adenopathy, mass or thyromegaly Cardiovascular:  Normal S1, S2, RRR without gallop, rub or murmur, nondisplaced PMI Respiratory:  Good breath sounds bilaterally, CTAB with normal respiratory effort Gastrointestinal: normal bowel sounds, soft, non-tender, no noted masses. No HSM MSK: no deformities, contusions. Joints are without erythema or swelling. Tender over left gr trochanteric bursa, spine and CVA region are nontender Skin:  Warm, no rashes or suspicious lesions noted Neurologic:    Mental status is normal. CN 2-11 are normal.  Gross motor and sensory exams are normal. Normal gait. No tremor  GR Trochanteric Bursa steroid injection  Procedure Note   Pre-operative Diagnosis: left hip bursitis   Post-operative Diagnosis: same   Indications: pain   Anesthesia: cold spray   Procedure Details    Verbal consent was obtained for the procedure. Universal time out done. The point of maximum tenderness was identified and marked over the hip bursa. The skin prepped with alcohol and cold spray used for anesthesia. A needle was advanced into the bursa and the steroid/lido was administered easily.    Complications:  None; patient tolerated the procedure well.    Commons side effects, risks, benefits, and alternatives for medications and treatment plan prescribed today were discussed, and the patient expressed understanding of the given instructions. Patient is instructed to call or message via MyChart if he/she has any questions or concerns regarding our treatment plan. No barriers to understanding were identified. We discussed Red Flag symptoms and signs in detail. Patient expressed understanding regarding what to do in case of urgent or emergency type symptoms.   Medication list was reconciled, printed and provided to the patient in AVS. Patient instructions and summary information was reviewed with the patient as documented in the AVS. This note was prepared with assistance of Dragon voice recognition software. Occasional wrong-word or sound-a-like substitutions may have occurred due to the inherent limitations of voice recognition software  This visit occurred during the SARS-CoV-2 public health emergency.  Safety protocols were in place, including screening questions prior to the visit, additional usage of staff PPE, and extensive cleaning of exam room while observing appropriate contact time as indicated for disinfecting solutions.

## 2019-02-28 ENCOUNTER — Telehealth: Payer: Self-pay

## 2019-02-28 NOTE — Telephone Encounter (Signed)
Patient notified

## 2019-02-28 NOTE — Telephone Encounter (Signed)
No, neither are indicated at this time. thanks

## 2019-03-06 NOTE — Addendum Note (Signed)
Addended bySerita Sheller on: 03/06/2019 11:15 AM   Modules accepted: Orders

## 2019-03-07 DIAGNOSIS — M7062 Trochanteric bursitis, left hip: Secondary | ICD-10-CM | POA: Diagnosis not present

## 2019-03-07 MED ORDER — TRIAMCINOLONE ACETONIDE 40 MG/ML IJ SUSP
40.0000 mg | Freq: Once | INTRAMUSCULAR | Status: AC
  Administered 2019-03-07: 10:00:00 40 mg via INTRA_ARTICULAR

## 2019-03-07 NOTE — Addendum Note (Signed)
Addended bySerita Sheller on: 03/07/2019 09:52 AM   Modules accepted: Orders

## 2019-03-19 ENCOUNTER — Encounter: Payer: Self-pay | Admitting: Family Medicine

## 2019-03-19 ENCOUNTER — Other Ambulatory Visit: Payer: Self-pay

## 2019-03-19 ENCOUNTER — Ambulatory Visit (INDEPENDENT_AMBULATORY_CARE_PROVIDER_SITE_OTHER): Payer: Medicare Other | Admitting: Family Medicine

## 2019-03-19 VITALS — BP 118/74 | HR 76 | Temp 98.1°F | Ht 62.0 in | Wt 154.4 lb

## 2019-03-19 DIAGNOSIS — M5137 Other intervertebral disc degeneration, lumbosacral region: Secondary | ICD-10-CM

## 2019-03-19 DIAGNOSIS — T50Z95A Adverse effect of other vaccines and biological substances, initial encounter: Secondary | ICD-10-CM | POA: Diagnosis not present

## 2019-03-19 NOTE — Patient Instructions (Signed)
Please follow up if symptoms do not improve or as needed.   Use cetaphil lotion/cream to the skin twice a day.  Use dove bar soap to bathe. Start OTC cetirizine (generic zyrtec) once nightly and benadryl 25mg  tabs as needed for itching.  Avoid all skin washes, alcohol/menthol liquids or anything that could be drying to the skin.  Let me know if you skin doesn't improve.  Use advil or tylenol for your leg pain. Start stretches

## 2019-03-19 NOTE — Progress Notes (Signed)
Subjective  CC:  Chief Complaint  Patient presents with  . Rash    rashes throughout body. noticed about a week ago. rashes itch/dry  . Leg Pain    left hip down to knee ache. started yesterday.    HPI: Nicole Jimenez is a 85 y.o. female who presents to the office today to address the problems listed above in the chief complaint.  Pt received covid vaccine on 1/21; shortly thereafter noted itching, diffusely w/o rash; however, skin is extremely dry and flaking. No h/o same. Using otc washes and menthol liquid to help rash. Hasn't taken any antihistamines. Otherwise feels well w/o malaise, f/c/s, sob, cough. No hives or vesicles. No pain. Also got a new puppy; worries she is allergic.   Has left upper leg pain: started yesterday. Discomfort. W/o radicular sxs: no weakness or b/b dysfunction. No injury. Treated for left hip bursitis w/ steroid injection; that helped. No leg swelling.   Assessment  1. Adverse effect of vaccine, initial encounter   2. DDD (degenerative disc disease), lumbosacral      Plan   Xerosis and itching:  ? Adverse vaccine related reaction; pt to report. Start moisturizers and antihistamines. Return if not improving.   DDD and upper leg pain. Tylenol, advil and stretching. Return if worsens.   Follow up: prn  05/28/2019  No orders of the defined types were placed in this encounter.  No orders of the defined types were placed in this encounter.     I reviewed the patients updated PMH, FH, and SocHx.    Patient Active Problem List   Diagnosis Date Noted  . History of femoral artery thrombosis - 2018 08/21/2018    Priority: High  . Anticoagulant long-term use 03/06/2018    Priority: High  . Paroxysmal atrial fibrillation (HCC) 01/23/2018    Priority: High  . Diastolic CHF ([place]) A999333    Priority: High  . Diet-controlled diabetes mellitus ([place]) 01/24/2017    Priority: High  . Subclinical hyperthyroidism 01/21/2014    Priority: High  .  Essential hypertension 02/23/2013    Priority: High  . Hypercholesterolemia 02/23/2013    Priority: High  . Major depression, chronic 07/18/2012    Priority: High  . DJD of right shoulder 01/24/2017    Priority: Medium  . Bereavement due to life event 03/05/2016    Priority: Medium  . Osteopenia 08/23/2015    Priority: Medium  . Idiopathic scoliosis of thoracic spine 07/09/2014    Priority: Medium  . Sjogren's syndrome ([place]) 08/04/2012    Priority: Medium  . DDD (degenerative disc disease), lumbosacral 07/15/2012    Priority: Medium  . Lumbosacral spondylosis 07/15/2012    Priority: Medium  . Osteoarthritis of knee 07/15/2012    Priority: Medium  . Bilateral edema of lower extremity 01/26/2017    Priority: Low  . Bilateral hip bursitis 07/17/2015    Priority: Low  . Age-related nuclear cataract of both eyes 03/11/2015    Priority: Low  . Insomnia 09/04/2013    Priority: Low  . Allergic rhinitis 07/15/2012    Priority: Low   Current Meds  Medication Sig  . Acetaminophen 500 MG coapsule Take 1,000 mg by mouth as needed.   Marland Kitchen atenolol (TENORMIN) 50 MG tablet Take 1 tablet (50 mg total) by mouth daily.  . Calcium Carb-Cholecalciferol 600-800 MG-UNIT CHEW Chew 1 each by mouth daily.   . clobetasol (OLUX) 0.05 % topical foam APPLY TO AFFECTED AREA EVERY DAY AT NIGHT  . diclofenac  sodium (VOLTAREN) 1 % GEL APPLY 2 G TOPICALLY 4 (FOUR) TIMES DAILY AS NEEDED. **PA DENIED**  . diltiazem (CARDIZEM CD) 180 MG 24 hr capsule Take 1 capsule (180 mg total) by mouth every morning.  Marland Kitchen ELIQUIS 2.5 MG TABS tablet TAKE 1 TABLET BY MOUTH TWICE A DAY  . Glucosamine-MSM-Hyaluronic Acd (JOINT HEALTH PO) Take 1 tablet by mouth daily.  Javier Docker Oil 500 MG CAPS Take 500 mg by mouth daily.  Marland Kitchen lisinopril-hydrochlorothiazide (ZESTORETIC) 10-12.5 MG tablet Take 1 tablet by mouth daily.  . Multiple Vitamins-Minerals (MULTIVITAMIN ADULT PO) Take by mouth.  Marland Kitchen omeprazole (PRILOSEC) 20 MG capsule TAKE ONE  CAPSULE BY MOUTH EVERY MORNING 30 MINUTES BEFORE BREAKFAST.  Marland Kitchen ONETOUCH ULTRA test strip USE TWICE DAILY TO CHECK BLOOD SUGAR - E11.40  . rosuvastatin (CRESTOR) 20 MG tablet Take 1 tablet (20 mg total) by mouth daily.  . TRINTELLIX 10 MG TABS tablet TAKE 1 TABLET BY MOUTH EVERY DAY    Allergies: Patient is allergic to amoxicillin-pot clavulanate; amoxicillin; and clarithromycin. Family History: Patient family history includes Arthritis in her mother and sister; Diabetes in her father and sister; Healthy in her daughter, son, and son; Heart attack in her father and mother; Hyperlipidemia in her father; Other in her son. Social History:  Patient  reports that she has never smoked. She has never used smokeless tobacco. She reports that she does not drink alcohol or use drugs.  Review of Systems: Constitutional: Negative for fever malaise or anorexia Cardiovascular: negative for chest pain Respiratory: negative for SOB or persistent cough Gastrointestinal: negative for abdominal pain  Objective  Vitals: BP 118/74 (BP Location: Right Arm, Patient Position: Sitting, Cuff Size: Normal)   Pulse 76   Temp 98.1 F (36.7 C) (Temporal)   Ht 5\' 2"  (1.575 m)   Wt 154 lb 6.4 oz (70 kg)   SpO2 98%   BMI 28.24 kg/m  General: no acute distress , A&Ox3 HEENT: PEERL, conjunctiva normal, Oropharynx moist,neck is supple Cardiovascular:  RRR without murmur or gallop.  Respiratory:  Good breath sounds bilaterally, CTAB with normal respiratory effort Skin:  Warm, very dry skin; flaking. No rash but excoriated areas: flexural surfaces of elbows, feet upper inner thighs.     Commons side effects, risks, benefits, and alternatives for medications and treatment plan prescribed today were discussed, and the patient expressed understanding of the given instructions. Patient is instructed to call or message via MyChart if he/she has any questions or concerns regarding our treatment plan. No barriers to  understanding were identified. We discussed Red Flag symptoms and signs in detail. Patient expressed understanding regarding what to do in case of urgent or emergency type symptoms.   Medication list was reconciled, printed and provided to the patient in AVS. Patient instructions and summary information was reviewed with the patient as documented in the AVS. This note was prepared with assistance of Dragon voice recognition software. Occasional wrong-word or sound-a-like substitutions may have occurred due to the inherent limitations of voice recognition software  This visit occurred during the SARS-CoV-2 public health emergency.  Safety protocols were in place, including screening questions prior to the visit, additional usage of staff PPE, and extensive cleaning of exam room while observing appropriate contact time as indicated for disinfecting solutions.

## 2019-05-12 ENCOUNTER — Other Ambulatory Visit: Payer: Self-pay | Admitting: Family Medicine

## 2019-05-15 DIAGNOSIS — Z7901 Long term (current) use of anticoagulants: Secondary | ICD-10-CM | POA: Diagnosis not present

## 2019-05-15 DIAGNOSIS — I480 Paroxysmal atrial fibrillation: Secondary | ICD-10-CM | POA: Diagnosis not present

## 2019-05-24 ENCOUNTER — Other Ambulatory Visit: Payer: Self-pay | Admitting: Family Medicine

## 2019-05-28 ENCOUNTER — Encounter: Payer: Self-pay | Admitting: Family Medicine

## 2019-05-28 ENCOUNTER — Other Ambulatory Visit: Payer: Self-pay

## 2019-05-28 ENCOUNTER — Ambulatory Visit (INDEPENDENT_AMBULATORY_CARE_PROVIDER_SITE_OTHER): Payer: Medicare Other | Admitting: Family Medicine

## 2019-05-28 VITALS — BP 128/82 | HR 89 | Temp 96.1°F | Resp 15 | Ht 62.0 in | Wt 156.4 lb

## 2019-05-28 DIAGNOSIS — E119 Type 2 diabetes mellitus without complications: Secondary | ICD-10-CM | POA: Diagnosis not present

## 2019-05-28 DIAGNOSIS — K9289 Other specified diseases of the digestive system: Secondary | ICD-10-CM

## 2019-05-28 DIAGNOSIS — I10 Essential (primary) hypertension: Secondary | ICD-10-CM | POA: Diagnosis not present

## 2019-05-28 DIAGNOSIS — J301 Allergic rhinitis due to pollen: Secondary | ICD-10-CM | POA: Diagnosis not present

## 2019-05-28 LAB — POCT GLYCOSYLATED HEMOGLOBIN (HGB A1C): Hemoglobin A1C: 7.4 % — AB (ref 4.0–5.6)

## 2019-05-28 NOTE — Progress Notes (Signed)
Order(s) created erroneously. Erroneous order ID: [id number]  Order canceled by: Darden Palmer  Order cancel date/time: 05/28/2019 1:42 PM

## 2019-05-28 NOTE — Patient Instructions (Signed)
Please return in 3 months for diabetes follow up  Start daily allegra and as needed gas X  Work on your diet: decrease chocolates and dried fruit. We will recheck your numbers in 3 months.   If you have any questions or concerns, please don't hesitate to send me a message via MyChart or call the office at [phone]. Thank you for visiting with [place] today! It's our pleasure caring for you.

## 2019-05-28 NOTE — Progress Notes (Signed)
Subjective  CC:  Chief Complaint  Patient presents with  . Diabetes    not checking blood sugar at home  . Hypertension    not checking BP at home  . Allergies    dry eyes, runny nose, random itching all over body, patient does take Benadryl, but states she does not believe it is allergies  . Gas    experiences after eating or drinking anything, is not worse with certain foods    HPI: Nicole Jimenez is a 85 y.o. female who presents to the office today for follow up of diabetes and problems listed above in the chief complaint.   Diabetes follow up: Her diabetic control is reported as Unchanged. But she admits eating more sweets and fruit. No sxs of hyperglycemia present.  She denies exertional CP or SOB or symptomatic hypoglycemia. She denies foot sores or paresthesias.   HTN: has been controlled. Feeling well. Taking medications w/o adverse effects. No symptoms of CHF, angina; no palpitations, sob, cp or lower extremity edema. Compliant with meds.   C/o running nose, clear w/ sneezing. Itching w/o rash. Not on meds.   gasiness and bloating w/o pain. No constipation or change in BMs. On PPI w/o GERD. Some belching.   Wt Readings from Last 3 Encounters:  05/28/19 156 lb 6.4 oz (70.9 kg)  03/19/19 154 lb 6.4 oz (70 kg)  02/27/19 158 lb 6.4 oz (71.8 kg)    BP Readings from Last 3 Encounters:  05/28/19 128/82  03/19/19 118/74  02/27/19 136/88    Assessment  1. Diet-controlled diabetes mellitus ([place])   2. Essential hypertension   3. Seasonal allergic rhinitis due to pollen   4. Gas bloat syndrome      Plan   Diabetes is currently marginally controlled. Has been diet controlled. Will adjust diet and return in 3 months. To start met or [id number] if needed at that time. Counseling and education done.   HTN is well controlled.   SAR: education on histamine response to allergens given. Start daily allegra. No sxs of infection at this time  Bloating: no red flag sxs. Start  prn gas x.    Follow up: 3 months for diabetes recheck .Marland Kitchen Orders Placed This Encounter  Procedures  . POCT hemoglobin  . POCT HgB A1C   No orders of the defined types were placed in this encounter.     Immunization History  Administered Date(s) Administered  . Fluad Quad(high Dose 65+) 11/20/2018  . Influenza Split 01/24/2012  . Influenza, High Dose Seasonal PF 12/02/2013, 01/20/2015, 12/28/2016  . Influenza,inj,Quad PF,6+ Mos 11/21/2015, 11/18/2017  . Influenza-Unspecified 01/24/2012, 01/05/2014, 01/05/2014, 01/20/2015  . PFIZER SARS-COV-2 Vaccination 03/01/2019, 03/22/2019  . Pneumococcal Conjugate-13 03/06/2015, 03/06/2015  . Pneumococcal Polysaccharide-23 06/18/2016  . Zoster 10/09/2012  . Zoster Recombinat (Shingrix) 03/09/2018, 08/21/2018    Diabetes Related Lab Review: Lab Results  Component Value Date   HGBA1C 7.4 (A) 05/28/2019   HGBA1C 6.6 (A) 11/20/2018   HGBA1C 6.5 (A) 08/21/2018    Lab Results  Component Value Date   MICROALBUR 5.6 (H) 11/18/2017   Lab Results  Component Value Date   CREATININE 1.06 02/27/2019   BUN 17 02/27/2019   NA 140 02/27/2019   K 3.4 (L) 02/27/2019   CL 98 02/27/2019   CO2 32 02/27/2019   Lab Results  Component Value Date   CHOL 163 02/27/2019   CHOL 156 02/24/2018   CHOL 211 (H) 11/18/2017   Lab Results  Component Value Date  HDL 68.70 02/27/2019   HDL 59.60 02/24/2018   HDL 70.00 11/18/2017   Lab Results  Component Value Date   LDLCALC 67 02/27/2019   LDLCALC 60 02/24/2018   LDLCALC 114 (H) 11/18/2017   Lab Results  Component Value Date   TRIG 135.0 02/27/2019   TRIG 181.0 (H) 02/24/2018   TRIG 134.0 11/18/2017   Lab Results  Component Value Date   CHOLHDL 2 02/27/2019   CHOLHDL 3 02/24/2018   CHOLHDL 3 11/18/2017   No results found for: LDLDIRECT The ASCVD Risk score Mikey Bussing DC Jr., et al., 2013) failed to calculate for the following reasons:   The 2013 ASCVD risk score is only valid for ages 60 to  34 I have reviewed the [place], Fam and Soc history. Patient Active Problem List   Diagnosis Date Noted  . History of femoral artery thrombosis - 2018 08/21/2018    Priority: High    Managed by vascular. 2018   . Anticoagulant long-term use 03/06/2018    Priority: High  . Paroxysmal atrial fibrillation (HCC) 01/23/2018    Priority: High  . Diastolic CHF ([place]) 29/24/4628    Priority: High    Echo 11/2016, nl EF but LVH and diastolic dysfunction    . Diet-controlled diabetes mellitus ([place]) 01/24/2017    Priority: High  . Subclinical hyperthyroidism 01/21/2014    Priority: High    Managed by endocrine   . Essential hypertension 02/23/2013    Priority: High  . Hypercholesterolemia 02/23/2013    Priority: High  . Major depression, chronic 07/18/2012    Priority: High  . DJD of right shoulder 01/24/2017    Priority: Medium  . Bereavement due to life event 03/05/2016    Priority: Medium    Her husband of 42 years died in 02/02/2016    . Osteopenia 08/23/2015    Priority: Medium    dexa 2017: T = -1.0 at femur, elevated FRAX score.  Repeat 2019 Osteopenia with elevated hip frac score of 10%:  T lowest = - 1.8 Major Osteoporotic Fracture: 17.1% Hip Fracture:                10.4% Population:                  [place] ([place]) Risk Factors:                Family Hist. (Parent hip fracture) Offer medications due to elevated hip fracture risk score.    . Idiopathic scoliosis of thoracic spine 07/09/2014    Priority: Medium  . Sjogren's syndrome ([place]) 08/04/2012    Priority: Medium  . DDD (degenerative disc disease), lumbosacral 07/15/2012    Priority: Medium  . Lumbosacral spondylosis 07/15/2012    Priority: Medium  . Osteoarthritis of knee 07/15/2012    Priority: Medium  . Bilateral edema of lower extremity 01/26/2017    Priority: Low  . Bilateral hip bursitis 07/17/2015    Priority: Low  . Age-related nuclear cataract of both eyes 03/11/2015    Priority: Low  . Insomnia  09/04/2013    Priority: Low  . Allergic rhinitis 07/15/2012    Priority: Low    Social History: Patient  reports that she has never smoked. She has never used smokeless tobacco. She reports that she does not drink alcohol or use drugs.  Review of Systems: Ophthalmic: negative for eye pain, loss of vision or double vision Cardiovascular: negative for chest pain Respiratory: negative for SOB or persistent cough Gastrointestinal: negative  for abdominal pain Genitourinary: negative for dysuria or gross hematuria MSK: negative for foot lesions Neurologic: negative for weakness or gait disturbance  Objective  Vitals: BP 128/82   Pulse 89   Temp (!) 96.1 F (35.6 C) (Temporal)   Resp 15   Ht 5' 2"  (1.575 m)   Wt 156 lb 6.4 oz (70.9 kg)   SpO2 97%   BMI 28.61 kg/m  General: well appearing, no acute distress  Psych:  Alert and oriented, normal mood and affect HEENT:  Normocephalic, atraumatic, moist mucous membranes, supple neck  Cardiovascular:  Nl S1 and S2, RRR without murmur, gallop or rub. no edema Respiratory:  Good breath sounds bilaterally, CTAB with normal effort, no rales Gastrointestinal: normal BS, soft, nontender Skin:  Warm, no rashes Foot exam: no erythema, pallor, or cyanosis visible nl proprioception and sensation to monofilament testing bilaterally, +2 distal pulses bilaterally    Diabetic education: ongoing education regarding chronic disease management for diabetes was given today. We continue to reinforce the ABC's of diabetic management: A1c (<7 or 8 dependent upon patient), tight blood pressure control, and cholesterol management with goal LDL < 100 minimally. We discuss diet strategies, exercise recommendations, medication options and possible side effects. At each visit, we review recommended immunizations and preventive care recommendations for diabetics and stress that good diabetic control can prevent other problems. See below for this patient's  data.    Commons side effects, risks, benefits, and alternatives for medications and treatment plan prescribed today were discussed, and the patient expressed understanding of the given instructions. Patient is instructed to call or message via MyChart if he/she has any questions or concerns regarding our treatment plan. No barriers to understanding were identified. We discussed Red Flag symptoms and signs in detail. Patient expressed understanding regarding what to do in case of urgent or emergency type symptoms.   Medication list was reconciled, printed and provided to the patient in AVS. Patient instructions and summary information was reviewed with the patient as documented in the AVS. This note was prepared with assistance of Dragon voice recognition software. Occasional wrong-word or sound-a-like substitutions may have occurred due to the inherent limitations of voice recognition software  This visit occurred during the SARS-CoV-2 public health emergency.  Safety protocols were in place, including screening questions prior to the visit, additional usage of staff PPE, and extensive cleaning of exam room while observing appropriate contact time as indicated for disinfecting solutions.

## 2019-06-25 ENCOUNTER — Other Ambulatory Visit: Payer: Self-pay | Admitting: Family Medicine

## 2019-08-08 ENCOUNTER — Other Ambulatory Visit: Payer: Self-pay | Admitting: Family Medicine

## 2019-08-09 NOTE — Telephone Encounter (Signed)
OK to send in script for Lipitor? Patient currently taking Crestor

## 2019-08-10 NOTE — Telephone Encounter (Signed)
Changed from crestor to lower cost.

## 2019-08-27 ENCOUNTER — Other Ambulatory Visit: Payer: Self-pay

## 2019-08-27 ENCOUNTER — Encounter: Payer: Self-pay | Admitting: Family Medicine

## 2019-08-27 ENCOUNTER — Ambulatory Visit (INDEPENDENT_AMBULATORY_CARE_PROVIDER_SITE_OTHER): Payer: Medicare Other | Admitting: Family Medicine

## 2019-08-27 VITALS — BP 130/76 | HR 69 | Temp 98.1°F | Ht 62.0 in | Wt 157.4 lb

## 2019-08-27 DIAGNOSIS — F329 Major depressive disorder, single episode, unspecified: Secondary | ICD-10-CM

## 2019-08-27 DIAGNOSIS — R600 Localized edema: Secondary | ICD-10-CM

## 2019-08-27 DIAGNOSIS — E119 Type 2 diabetes mellitus without complications: Secondary | ICD-10-CM | POA: Diagnosis not present

## 2019-08-27 DIAGNOSIS — I10 Essential (primary) hypertension: Secondary | ICD-10-CM | POA: Diagnosis not present

## 2019-08-27 LAB — MICROALBUMIN / CREATININE URINE RATIO
Creatinine,U: 82.7 mg/dL
Microalb Creat Ratio: 3.9 mg/g (ref 0.0–30.0)
Microalb, Ur: 3.2 mg/dL — ABNORMAL HIGH (ref 0.0–1.9)

## 2019-08-27 LAB — POCT GLYCOSYLATED HEMOGLOBIN (HGB A1C): Hemoglobin A1C: 6.8 % — AB (ref 4.0–5.6)

## 2019-08-27 NOTE — Progress Notes (Signed)
Subjective  CC:  Chief Complaint  Patient presents with  . Diabetes    checks DM 1-2 weekly. Readings range between 140-160s    HPI: Nicole Jimenez is a 85 y.o. female who presents to the office today for follow up of diabetes and problems listed above in the chief complaint.   Diabetes follow up: Her diabetic control is reported as Improved.  She is trying to watch what she eats.  She denies symptoms of hypoglycemia.  She prefers to remain off diabetes medications if possible. She denies exertional CP or SOB or symptomatic hypoglycemia. She denies foot sores or paresthesias.  She is due for urine microalbuminuria test.  She is on ACE inhibitor.  Hypertension has been well controlled.  She continues on Zestoretic and atenolol.  No adverse effects.  She does have a some pedal edema.  Depression: Mood remains stable.  She reports occasional bad days but for the most part she is feeling much better than in years past.  She tolerates her medications without adverse effects.  Hyperlipidemia well-controlled on statin without myalgias. Wt Readings from Last 3 Encounters:  08/27/19 157 lb 6.4 oz (71.4 kg)  05/28/19 156 lb 6.4 oz (70.9 kg)  03/19/19 154 lb 6.4 oz (70 kg)     BP Readings from Last 3 Encounters:  08/27/19 130/76  05/28/19 128/82  03/19/19 118/74    Assessment  1. Diet-controlled diabetes mellitus ([place])   2. Essential hypertension   3. Bilateral edema of lower extremity   4. Major depression, chronic      Plan   Diabetes is currently well controlled.  Continue diet controlled.  No medication started.  Check urine test today.  On ACE inhibitor.  Blood pressure remains well controlled  Hyperlipidemia at goal on statin.  Depression is well controlled.  Reassured, continue Trintellix long-term.  Follow up: January 2022 for complete physical and follow-up diabetes, hypertension and hyperlipidemia. Orders Placed This Encounter  Procedures  . Microalbumin /  creatinine urine ratio  . POCT HgB A1C   No orders of the defined types were placed in this encounter.     Immunization History  Administered Date(s) Administered  . Fluad Quad(high Dose 65+) 11/20/2018  . Influenza Split 01/24/2012  . Influenza, High Dose Seasonal PF 12/02/2013, 01/20/2015, 12/28/2016  . Influenza,inj,Quad PF,6+ Mos 11/21/2015, 11/18/2017  . Influenza-Unspecified 01/24/2012, 01/05/2014, 01/05/2014, 01/20/2015  . PFIZER SARS-COV-2 Vaccination 03/01/2019, 03/22/2019  . Pneumococcal Conjugate-13 03/06/2015, 03/06/2015  . Pneumococcal Polysaccharide-23 06/18/2016  . Zoster 10/09/2012  . Zoster Recombinat (Shingrix) 03/09/2018, 08/21/2018    Diabetes Related Lab Review: Lab Results  Component Value Date   HGBA1C 6.8 (A) 08/27/2019   HGBA1C 7.4 (A) 05/28/2019   HGBA1C 6.6 (A) 11/20/2018    Lab Results  Component Value Date   MICROALBUR 5.6 (H) 11/18/2017   Lab Results  Component Value Date   CREATININE 1.06 02/27/2019   BUN 17 02/27/2019   NA 140 02/27/2019   K 3.4 (L) 02/27/2019   CL 98 02/27/2019   CO2 32 02/27/2019   Lab Results  Component Value Date   CHOL 163 02/27/2019   CHOL 156 02/24/2018   CHOL 211 (H) 11/18/2017   Lab Results  Component Value Date   HDL 68.70 02/27/2019   HDL 59.60 02/24/2018   HDL 70.00 11/18/2017   Lab Results  Component Value Date   LDLCALC 67 02/27/2019   LDLCALC 60 02/24/2018   LDLCALC 114 (H) 11/18/2017   Lab Results  Component Value  Date   TRIG 135.0 02/27/2019   TRIG 181.0 (H) 02/24/2018   TRIG 134.0 11/18/2017   Lab Results  Component Value Date   CHOLHDL 2 02/27/2019   CHOLHDL 3 02/24/2018   CHOLHDL 3 11/18/2017   No results found for: LDLDIRECT The ASCVD Risk score Mikey Bussing DC Jr., et al., 2013) failed to calculate for the following reasons:   The 2013 ASCVD risk score is only valid for ages 60 to 81 I have reviewed the [place], Fam and Soc history. Patient Active Problem List   Diagnosis Date Noted   . History of femoral artery thrombosis - 2018 08/21/2018    Priority: High    Managed by vascular. 2018   . Anticoagulant long-term use 03/06/2018    Priority: High  . Paroxysmal atrial fibrillation (HCC) 01/23/2018    Priority: High  . Diastolic CHF ([place]) 52/77/8242    Priority: High    Echo 11/2016, nl EF but LVH and diastolic dysfunction    . Diet-controlled diabetes mellitus ([place]) 01/24/2017    Priority: High  . Subclinical hyperthyroidism 01/21/2014    Priority: High    Managed by endocrine   . Essential hypertension 02/23/2013    Priority: High  . Hypercholesterolemia 02/23/2013    Priority: High  . Major depression, chronic 07/18/2012    Priority: High  . DJD of right shoulder 01/24/2017    Priority: Medium  . Bereavement due to life event 03/05/2016    Priority: Medium    Her husband of 49 years died in 2016/03/02    . Osteopenia 08/23/2015    Priority: Medium    dexa 2017: T = -1.0 at femur, elevated FRAX score.  Repeat 2019 Osteopenia with elevated hip frac score of 10%:  T lowest = - 1.8 Major Osteoporotic Fracture: 17.1% Hip Fracture:                10.4% Population:                  [place] (Asian) Risk Factors:                Family Hist. (Parent hip fracture) Offer medications due to elevated hip fracture risk score.    . Idiopathic scoliosis of thoracic spine 07/09/2014    Priority: Medium  . Sjogren's syndrome ([place]) 08/04/2012    Priority: Medium  . DDD (degenerative disc disease), lumbosacral 07/15/2012    Priority: Medium  . Lumbosacral spondylosis 07/15/2012    Priority: Medium  . Osteoarthritis of knee 07/15/2012    Priority: Medium  . Bilateral edema of lower extremity 01/26/2017    Priority: Low  . Bilateral hip bursitis 07/17/2015    Priority: Low  . Age-related nuclear cataract of both eyes 03/11/2015    Priority: Low  . Insomnia 09/04/2013    Priority: Low  . Allergic rhinitis 07/15/2012    Priority: Low    Social  History: Patient  reports that she has never smoked. She has never used smokeless tobacco. She reports that she does not drink alcohol and does not use drugs.  Review of Systems: Ophthalmic: negative for eye pain, loss of vision or double vision Cardiovascular: negative for chest pain Respiratory: negative for SOB or persistent cough Gastrointestinal: negative for abdominal pain Genitourinary: negative for dysuria or gross hematuria MSK: negative for foot lesions Neurologic: negative for weakness or gait disturbance  Objective  Vitals: BP 130/76 (BP Location: Right Arm, Patient Position: Sitting, Cuff Size: Normal)   Pulse 69  Temp 98.1 F (36.7 C) (Temporal)   Ht 5\' 2"  (1.575 m)   Wt 157 lb 6.4 oz (71.4 kg)   SpO2 95%   BMI 28.79 kg/m  General: well appearing, no acute distress  Psych:  Alert and oriented, normal mood and affect HEENT:  Normocephalic, atraumatic, moist mucous membranes, supple neck  Cardiovascular:  Nl S1 and S2, RRR without murmur, gallop or rub.  Respiratory:  Good breath sounds bilaterally, CTAB with normal effort, no rales Trace pitting edema bilateral lower extremities    Diabetic education: ongoing education regarding chronic disease management for diabetes was given today. We continue to reinforce the ABC's of diabetic management: A1c (<7 or 8 dependent upon patient), tight blood pressure control, and cholesterol management with goal LDL < 100 minimally. We discuss diet strategies, exercise recommendations, medication options and possible side effects. At each visit, we review recommended immunizations and preventive care recommendations for diabetics and stress that good diabetic control can prevent other problems. See below for this patient's data.    Commons side effects, risks, benefits, and alternatives for medications and treatment plan prescribed today were discussed, and the patient expressed understanding of the given instructions. Patient is  instructed to call or message via MyChart if he/she has any questions or concerns regarding our treatment plan. No barriers to understanding were identified. We discussed Red Flag symptoms and signs in detail. Patient expressed understanding regarding what to do in case of urgent or emergency type symptoms.   Medication list was reconciled, printed and provided to the patient in AVS. Patient instructions and summary information was reviewed with the patient as documented in the AVS. This note was prepared with assistance of Dragon voice recognition software. Occasional wrong-word or sound-a-like substitutions may have occurred due to the inherent limitations of voice recognition software  This visit occurred during the SARS-CoV-2 public health emergency.  Safety protocols were in place, including screening questions prior to the visit, additional usage of staff PPE, and extensive cleaning of exam room while observing appropriate contact time as indicated for disinfecting solutions.

## 2019-08-27 NOTE — Patient Instructions (Signed)
Please return in January 2022 for your annual complete physical; please come fasting and diabetes recheck.   Things look good!   If you have any questions or concerns, please don't hesitate to send me a message via MyChart or call the office at [phone]. Thank you for visiting with [place] today! It's our pleasure caring for you.

## 2019-08-28 ENCOUNTER — Encounter: Payer: Self-pay | Admitting: Family Medicine

## 2019-09-22 ENCOUNTER — Other Ambulatory Visit: Payer: Self-pay | Admitting: Family Medicine

## 2019-10-26 ENCOUNTER — Telehealth: Payer: Self-pay | Admitting: Family Medicine

## 2019-10-26 NOTE — Telephone Encounter (Signed)
Yes, this is a good idea. She will be eligible in October 2021

## 2019-10-26 NOTE — Telephone Encounter (Signed)
Please advise 

## 2019-10-26 NOTE — Telephone Encounter (Signed)
Patient would like to know if Dr. Jonni Sanger would advise her to take the COVID booster?

## 2019-10-29 NOTE — Telephone Encounter (Signed)
Patient notified of PCP recommendations below.

## 2019-10-31 DIAGNOSIS — H35362 Drusen (degenerative) of macula, left eye: Secondary | ICD-10-CM | POA: Diagnosis not present

## 2019-10-31 DIAGNOSIS — H524 Presbyopia: Secondary | ICD-10-CM | POA: Diagnosis not present

## 2019-10-31 DIAGNOSIS — H43813 Vitreous degeneration, bilateral: Secondary | ICD-10-CM | POA: Diagnosis not present

## 2019-10-31 DIAGNOSIS — H5203 Hypermetropia, bilateral: Secondary | ICD-10-CM | POA: Diagnosis not present

## 2019-10-31 DIAGNOSIS — H35371 Puckering of macula, right eye: Secondary | ICD-10-CM | POA: Diagnosis not present

## 2019-10-31 DIAGNOSIS — H02834 Dermatochalasis of left upper eyelid: Secondary | ICD-10-CM | POA: Diagnosis not present

## 2019-10-31 DIAGNOSIS — H02831 Dermatochalasis of right upper eyelid: Secondary | ICD-10-CM | POA: Diagnosis not present

## 2019-10-31 DIAGNOSIS — Z961 Presence of intraocular lens: Secondary | ICD-10-CM | POA: Diagnosis not present

## 2019-10-31 DIAGNOSIS — E119 Type 2 diabetes mellitus without complications: Secondary | ICD-10-CM | POA: Diagnosis not present

## 2019-10-31 DIAGNOSIS — H04123 Dry eye syndrome of bilateral lacrimal glands: Secondary | ICD-10-CM | POA: Diagnosis not present

## 2019-10-31 DIAGNOSIS — H52203 Unspecified astigmatism, bilateral: Secondary | ICD-10-CM | POA: Diagnosis not present

## 2019-11-16 DIAGNOSIS — Z23 Encounter for immunization: Secondary | ICD-10-CM | POA: Diagnosis not present

## 2019-11-20 DIAGNOSIS — I480 Paroxysmal atrial fibrillation: Secondary | ICD-10-CM | POA: Diagnosis not present

## 2019-12-06 ENCOUNTER — Other Ambulatory Visit: Payer: Self-pay | Admitting: Family Medicine

## 2019-12-11 ENCOUNTER — Telehealth: Payer: Self-pay | Admitting: Family Medicine

## 2019-12-11 NOTE — Telephone Encounter (Signed)
Left message for patient to call back and schedule Medicare Annual Wellness Visit (AWV) either virtually/audio only OR in office. Whatever the patients preference is.  Last AWV 11/20/18; please schedule at anytime with [facility] Advisor at [facility].  This should be a 45 minute visit.   

## 2019-12-15 ENCOUNTER — Other Ambulatory Visit: Payer: Self-pay | Admitting: Family Medicine

## 2020-02-25 ENCOUNTER — Encounter: Payer: Self-pay | Admitting: Family Medicine

## 2020-02-26 ENCOUNTER — Ambulatory Visit: Payer: Medicare Other | Admitting: Family Medicine

## 2020-04-03 ENCOUNTER — Encounter: Payer: Self-pay | Admitting: Family Medicine

## 2020-04-04 ENCOUNTER — Other Ambulatory Visit: Payer: Self-pay

## 2020-04-04 MED ORDER — ONETOUCH ULTRA VI STRP: ORAL_STRIP | 1 refills | Status: DC

## 2020-04-07 ENCOUNTER — Other Ambulatory Visit: Payer: Self-pay | Admitting: Family Medicine

## 2020-05-19 ENCOUNTER — Other Ambulatory Visit: Payer: Self-pay

## 2020-05-19 ENCOUNTER — Ambulatory Visit (INDEPENDENT_AMBULATORY_CARE_PROVIDER_SITE_OTHER): Payer: Medicare Other | Admitting: Family Medicine

## 2020-05-19 ENCOUNTER — Encounter: Payer: Self-pay | Admitting: Family Medicine

## 2020-05-19 VITALS — BP 144/82 | HR 81 | Temp 97.6°F | Ht 62.0 in | Wt 161.2 lb

## 2020-05-19 DIAGNOSIS — M8580 Other specified disorders of bone density and structure, unspecified site: Secondary | ICD-10-CM

## 2020-05-19 DIAGNOSIS — Z86718 Personal history of other venous thrombosis and embolism: Secondary | ICD-10-CM

## 2020-05-19 DIAGNOSIS — Z780 Asymptomatic menopausal state: Secondary | ICD-10-CM | POA: Diagnosis not present

## 2020-05-19 DIAGNOSIS — E119 Type 2 diabetes mellitus without complications: Secondary | ICD-10-CM

## 2020-05-19 DIAGNOSIS — H6121 Impacted cerumen, right ear: Secondary | ICD-10-CM

## 2020-05-19 DIAGNOSIS — F329 Major depressive disorder, single episode, unspecified: Secondary | ICD-10-CM | POA: Diagnosis not present

## 2020-05-19 DIAGNOSIS — M3500 Sicca syndrome, unspecified: Secondary | ICD-10-CM

## 2020-05-19 DIAGNOSIS — I480 Paroxysmal atrial fibrillation: Secondary | ICD-10-CM

## 2020-05-19 DIAGNOSIS — E0590 Thyrotoxicosis, unspecified without thyrotoxic crisis or storm: Secondary | ICD-10-CM | POA: Diagnosis not present

## 2020-05-19 DIAGNOSIS — I10 Essential (primary) hypertension: Secondary | ICD-10-CM | POA: Diagnosis not present

## 2020-05-19 DIAGNOSIS — E782 Mixed hyperlipidemia: Secondary | ICD-10-CM

## 2020-05-19 DIAGNOSIS — Z7901 Long term (current) use of anticoagulants: Secondary | ICD-10-CM | POA: Diagnosis not present

## 2020-05-19 DIAGNOSIS — I5032 Chronic diastolic (congestive) heart failure: Secondary | ICD-10-CM | POA: Diagnosis not present

## 2020-05-19 LAB — COMPREHENSIVE METABOLIC PANEL
ALT: 30 U/L (ref 0–35)
AST: 29 U/L (ref 0–37)
Albumin: 3.9 g/dL (ref 3.5–5.2)
Alkaline Phosphatase: 92 U/L (ref 39–117)
BUN: 22 mg/dL (ref 6–23)
CO2: 31 mEq/L (ref 19–32)
Calcium: 9.6 mg/dL (ref 8.4–10.5)
Chloride: 100 mEq/L (ref 96–112)
Creatinine, Ser: 0.98 mg/dL (ref 0.40–1.20)
GFR: 53.41 mL/min — ABNORMAL LOW (ref >60.00)
Glucose, Bld: 192 mg/dL — ABNORMAL HIGH (ref 70–99)
Potassium: 4.5 mEq/L (ref 3.5–5.1)
Sodium: 141 mEq/L (ref 135–145)
Total Bilirubin: 1 mg/dL (ref 0.2–1.2)
Total Protein: 7.3 g/dL (ref 6.0–8.3)

## 2020-05-19 LAB — CBC WITH DIFFERENTIAL/PLATELET
Basophils Absolute: 0 10*3/uL (ref 0.0–0.1)
Basophils Relative: 0.6 % (ref 0.0–3.0)
Eosinophils Absolute: 0.1 10*3/uL (ref 0.0–0.7)
Eosinophils Relative: 1.6 % (ref 0.0–5.0)
HCT: 45.5 % (ref 36.0–46.0)
Hemoglobin: 15.1 g/dL — ABNORMAL HIGH (ref 12.0–15.0)
Lymphocytes Relative: 23.7 % (ref 12.0–46.0)
Lymphs Abs: 1.3 10*3/uL (ref 0.7–4.0)
MCHC: 33.2 g/dL (ref 30.0–36.0)
MCV: 90.4 fl (ref 78.0–100.0)
Monocytes Absolute: 0.6 10*3/uL (ref 0.1–1.0)
Monocytes Relative: 10.4 % (ref 3.0–12.0)
Neutro Abs: 3.4 10*3/uL (ref 1.4–7.7)
Neutrophils Relative %: 63.7 % (ref 43.0–77.0)
Platelets: 143 10*3/uL — ABNORMAL LOW (ref 150.0–400.0)
RBC: 5.03 Mil/uL (ref 3.87–5.11)
RDW: 13.3 % (ref 11.5–15.5)
WBC: 5.3 10*3/uL (ref 4.0–10.5)

## 2020-05-19 LAB — LIPID PANEL
Cholesterol: 175 mg/dL (ref 0–200)
HDL: 75.1 mg/dL (ref >39.00)
LDL Cholesterol: 76 mg/dL (ref 0–99)
NonHDL: 99.57
Total CHOL/HDL Ratio: 2
Triglycerides: 116 mg/dL (ref 0.0–149.0)
VLDL: 23.2 mg/dL (ref 0.0–40.0)

## 2020-05-19 LAB — POCT GLYCOSYLATED HEMOGLOBIN (HGB A1C): Hemoglobin A1C: 6.4 % — AB (ref 4.0–5.6)

## 2020-05-19 LAB — TSH: TSH: 0.34 u[IU]/mL — ABNORMAL LOW (ref 0.35–4.50)

## 2020-05-19 LAB — T4, FREE: Free T4: 0.91 ng/dL (ref 0.60–1.60)

## 2020-05-19 MED ORDER — APIXABAN 2.5 MG PO TABS: 2.5000 mg | ORAL_TABLET | Freq: Two times a day (BID) | ORAL | 3 refills | Status: DC

## 2020-05-19 NOTE — Patient Instructions (Signed)
Please return in 6 months for blood pressure and diabetes recheck.   I'm glad you are well. It is fine to take the tylenol nightly.  I will release your lab results to you on your MyChart account with further instructions. Please reply with any questions.   I have ordered a mammogram and/or bone density for you as we discussed today: []   Mammogram  [x]   Bone Density  Please call the office checked below to schedule your appointment:  [x]   [facility]      [place]         []   [facility]  [place], [place]   If you have any questions or concerns, please don't hesitate to send me a message via MyChart or call the office at [phone]. Thank you for visiting with [place] today! It's our pleasure caring for you.

## 2020-05-19 NOTE — Progress Notes (Signed)
Subjective  Chief Complaint  Patient presents with  . Annual Exam    Non-fasting. Has stopped walking/exercising - increased bilateral feet swelling   . Diabetes  . Arthritis    Fingers, back, shoulders  . Health Maintenance    Needs DEXA ordered today     HPI: Nicole Jimenez is a 86 y.o. female who presents to [facility] today for a Female Wellness Visit. She also has the concerns and/or needs as listed above in the chief complaint. These will be addressed in addition to the Health Maintenance Visit.   Wellness Visit: annual visit with health maintenance review and exam without Pap   HM: due dexa for f/u ostepenia; imms up to date Chronic disease f/u and/or acute problem visit: (deemed necessary to be done in addition to the wellness visit):  Diet-controlled diabetes: Continues to eat well.  Weight is stable although a few pounds heavier.  She has been less active over the winter.  No symptoms of hypoglycemia.  No complications.  Hypertension: No chest pain, shortness of breath.  Has chronic diastolic heart failure with mild lower extremity edema.  Worse with prolonged sitting.  No rash or weeping.  No chest pain or shortness of breath.  On multiple antihypertensives and good compliance.  PAF and history of femoral artery thrombosis on chronic Eliquis.  No bleeds or side effects.  Compliance is good.  History of subclinical hypothyroidism: No symptoms of low or high thyroid.  No longer seeing endocrinology.  Chronic major depression remains in remission.  On Trintellix.  Good response.  Keeps active with church groups, online zoom meetings etc.  Mood is good.  Sjogren's syndrome: Asymptomatic currently.  Osteopenia  Hyperlipidemia on statin.  Due for recheck.  Assessment  1. Diet-controlled diabetes mellitus ([place])   2. Essential hypertension   3. History of femoral artery thrombosis - 2018   4. Anticoagulant long-term use   5. Major  depression, chronic   6. Mixed hyperlipidemia   7. Paroxysmal atrial fibrillation (HCC)   8. Subclinical hyperthyroidism   9. Osteopenia, unspecified location   10. Sjogren's syndrome without extraglandular involvement ([place])   11. Chronic diastolic congestive heart failure ([place])   12. Asymptomatic menopausal state   13. Impacted cerumen of right ear      Plan  Female Wellness Visit:  Age appropriate Health Maintenance and Prevention measures were discussed with patient. Included topics are cancer screening recommendations, ways to keep healthy (see AVS) including dietary and exercise recommendations, regular eye and dental care, use of seat belts, and avoidance of moderate alcohol use and tobacco use.   BMI: discussed patient's BMI and encouraged positive lifestyle modifications to help get to or maintain a target BMI.  HM needs and immunizations were addressed and ordered. See below for orders. See HM and immunization section for updates.  Routine labs and screening tests ordered including cmp, cbc and lipids where appropriate.  Discussed recommendations regarding Vit D and calcium supplementation (see AVS)  Chronic disease management visit and/or acute problem visit:  Hypertension is fairly well controlled.  Minimal elevation here in office today.  Will monitor.  At goal for her age and comorbidities.  Continue current medications.  Check renal function electrolytes.  Hyperlipidemia on statin.  Recheck today.  Tolerating well.  Chronic diastolic heart failure, A. fib, major depression, Sjogren's: All chronic medical problems remain well controlled.  Reviewed medication list in detail.  No changes made.  DEXA ordered  Diabetes: Remains  well controlled with diet only.  Today's visit was 46 minutes long. Greater than 50% of this time was devoted to face to face counseling with the patient and coordination of care. We discussed her diagnosis, prognosis, treatment options and  treatment plan is documented above.   Follow up: 6 months for recheck Orders Placed This Encounter  Procedures  . DG Bone Density  . CBC with Differential/Platelet  . Comprehensive metabolic panel  . Lipid panel  . TSH  . T3  . T4, free  . POCT HgB A1C   Meds ordered this encounter  Medications  . apixaban (ELIQUIS) 2.5 MG TABS tablet    Sig: Take 1 tablet (2.5 mg total) by mouth 2 (two) times daily.    Dispense:  180 tablet    Refill:  3      Body mass index is 29.48 kg/m. Wt Readings from Last 3 Encounters:  05/19/20 161 lb 3.2 oz (73.1 kg)  08/27/19 157 lb 6.4 oz (71.4 kg)  05/28/19 156 lb 6.4 oz (70.9 kg)     Patient Active Problem List   Diagnosis Date Noted  . History of femoral artery thrombosis - 2018 08/21/2018    Priority: High    Managed by vascular. 2018   . Anticoagulant long-term use 03/06/2018    Priority: High  . Paroxysmal atrial fibrillation (HCC) 01/23/2018    Priority: High  . Diastolic CHF ([place]) 85/27/7824    Priority: High    Echo 11/2016, nl EF but LVH and diastolic dysfunction    . Diet-controlled diabetes mellitus ([place]) 01/24/2017    Priority: High  . Subclinical hyperthyroidism 01/21/2014    Priority: High    Managed by endocrine   . Essential hypertension 02/23/2013    Priority: High  . Mixed hyperlipidemia 02/23/2013    Priority: High  . Major depression, chronic 07/18/2012    Priority: High  . DJD of right shoulder 01/24/2017    Priority: Medium  . Bereavement due to life event 03/05/2016    Priority: Medium    Her husband of 67 years died in 02/10/2016    . Osteopenia 08/23/2015    Priority: Medium    dexa 2017: T = -1.0 at femur, elevated FRAX score.  Repeat 2019 Osteopenia with elevated hip frac score of 10%:  T lowest = - 1.8 Major Osteoporotic Fracture: 17.1% Hip Fracture:                10.4% Population:                  [place] ([place]) Risk Factors:                Family Hist. (Parent hip fracture) Offer  medications due to elevated hip fracture risk score.    . Idiopathic scoliosis of thoracic spine 07/09/2014    Priority: Medium  . Sjogren's syndrome ([place]) 08/04/2012    Priority: Medium  . DDD (degenerative disc disease), lumbosacral 07/15/2012    Priority: Medium  . Lumbosacral spondylosis 07/15/2012    Priority: Medium  . Osteoarthritis of knee 07/15/2012    Priority: Medium  . Bilateral edema of lower extremity 01/26/2017    Priority: Low  . Bilateral hip bursitis 07/17/2015    Priority: Low  . Age-related nuclear cataract of both eyes 03/11/2015    Priority: Low  . Insomnia 09/04/2013    Priority: Low  . Allergic rhinitis 07/15/2012    Priority: Low   Health Maintenance  Topic Date  Due  . DEXA SCAN  02/10/2020  . FOOT EXAM  05/27/2020  . INFLUENZA VACCINE  09/08/2020  . OPHTHALMOLOGY EXAM  10/09/2020  . HEMOGLOBIN A1C  11/18/2020  . COVID-19 Vaccine  Completed  . PNA vac Low Risk Adult  Completed  . HPV VACCINES  Aged Out   Immunization History  Administered Date(s) Administered  . Fluad Quad(high Dose 65+) 11/20/2018  . Influenza Split 01/24/2012  . Influenza, High Dose Seasonal PF 12/02/2013, 01/20/2015, 12/28/2016, 10/26/2019  . Influenza,inj,Quad PF,6+ Mos 11/21/2015, 11/18/2017  . Influenza-Unspecified 01/24/2012, 01/05/2014, 01/05/2014, 01/20/2015  . PFIZER(Purple Top)SARS-COV-2 Vaccination 03/01/2019, 03/22/2019, 11/16/2019  . Pneumococcal Conjugate-13 03/06/2015, 03/06/2015  . Pneumococcal Polysaccharide-23 06/18/2016  . Zoster 10/09/2012  . Zoster Recombinat (Shingrix) 03/09/2018, 08/21/2018   We updated and reviewed the patient's past history in detail and it is documented below. Allergies: Patient is allergic to amoxicillin-pot clavulanate, amoxicillin, and clarithromycin. Past Medical History Patient  has a past medical history of Arthritis, Depression, Diabetes mellitus without complication ([place]), Diastolic CHF ([place]) (7/42/5956), GERD  (gastroesophageal reflux disease), Hyperlipidemia, Hypertension, Peripheral arterial disease ([place]) (2018), and Thyroid disease. Past Surgical History Patient  has a past surgical history that includes Femoral endarterectomy (Right, 11/29/2016). Family History: Patient family history includes Arthritis in her mother and sister; Diabetes in her father and sister; Healthy in her daughter, son, and son; Heart attack in her father and mother; Hyperlipidemia in her father; Other in her son. Social History:  Patient  reports that she has never smoked. She has never used smokeless tobacco. She reports that she does not drink alcohol and does not use drugs.  Review of Systems: Constitutional: negative for fever or malaise Ophthalmic: negative for photophobia, double vision or loss of vision Cardiovascular: negative for chest pain, dyspnea on exertion, or new LE swelling Respiratory: negative for SOB or persistent cough Gastrointestinal: negative for abdominal pain, change in bowel habits or melena Genitourinary: negative for dysuria or gross hematuria, no abnormal uterine bleeding or disharge Musculoskeletal: negative for new gait disturbance or muscular weakness Integumentary: negative for new or persistent rashes, no breast lumps Neurological: negative for TIA or stroke symptoms Psychiatric: negative for SI or delusions Allergic/Immunologic: negative for hives  Patient Care Team    Relationship Specialty Notifications Start End  Leamon Arnt, MD PCP - General Family Medicine  01/24/17   Roel Cluck, MD Referring Physician Ophthalmology  01/24/17   Alanson Aly, MD Consulting Physician Endocrinology  01/24/17   Dinah Beers, MD Referring Physician Vascular Surgery  01/24/17   Gaynelle Arabian, MD Consulting Physician Orthopedic Surgery  07/08/17   Dermatology, [place] Physician   11/20/18      Objective  Vitals: BP (!) 144/82   Pulse 81   Temp 97.6 F (36.4 C)  (Temporal)   Ht 5\' 2"  (1.575 m)   Wt 161 lb 3.2 oz (73.1 kg)   SpO2 98%   BMI 29.48 kg/m  General:  Well developed, well nourished, no acute distress  Psych:  Alert and orientedx3,normal mood and affect HEENT:  Normocephalic, atraumatic, non-icteric sclera,  supple neck without adenopathy, mass or thyromegaly, right cerumen impaction irrigated and corrected Cardiovascular:  Normal S1, S2, RRR without gallop, rub or murmur Respiratory:  Good breath sounds bilaterally, CTAB with normal respiratory effort MSK: no deformities, contusions. Joints are without erythema or swelling.  Skin:  Warm, no rashes or suspicious lesions noted Neurologic:    Mental status is normal. CN 2-11 are normal. Gross motor and sensory  exams are normal. Normal gait. No tremor    Commons side effects, risks, benefits, and alternatives for medications and treatment plan prescribed today were discussed, and the patient expressed understanding of the given instructions. Patient is instructed to call or message via MyChart if he/she has any questions or concerns regarding our treatment plan. No barriers to understanding were identified. We discussed Red Flag symptoms and signs in detail. Patient expressed understanding regarding what to do in case of urgent or emergency type symptoms.   Medication list was reconciled, printed and provided to the patient in AVS. Patient instructions and summary information was reviewed with the patient as documented in the AVS. This note was prepared with assistance of Dragon voice recognition software. Occasional wrong-word or sound-a-like substitutions may have occurred due to the inherent limitations of voice recognition software  This visit occurred during the SARS-CoV-2 public health emergency.  Safety protocols were in place, including screening questions prior to the visit, additional usage of staff PPE, and extensive cleaning of exam room while observing appropriate contact time as  indicated for disinfecting solutions.

## 2020-05-20 DIAGNOSIS — Z23 Encounter for immunization: Secondary | ICD-10-CM | POA: Diagnosis not present

## 2020-05-20 LAB — T3: T3, Total: 105 ng/dL (ref 76–181)

## 2020-05-29 DIAGNOSIS — I480 Paroxysmal atrial fibrillation: Secondary | ICD-10-CM | POA: Diagnosis not present

## 2020-05-29 DIAGNOSIS — I4821 Permanent atrial fibrillation: Secondary | ICD-10-CM | POA: Diagnosis not present

## 2020-05-29 DIAGNOSIS — Z7901 Long term (current) use of anticoagulants: Secondary | ICD-10-CM | POA: Diagnosis not present

## 2020-05-30 ENCOUNTER — Other Ambulatory Visit: Payer: Self-pay | Admitting: Family Medicine

## 2020-05-30 DIAGNOSIS — Z780 Asymptomatic menopausal state: Secondary | ICD-10-CM

## 2020-05-30 DIAGNOSIS — M8580 Other specified disorders of bone density and structure, unspecified site: Secondary | ICD-10-CM

## 2020-06-02 ENCOUNTER — Ambulatory Visit (INDEPENDENT_AMBULATORY_CARE_PROVIDER_SITE_OTHER): Payer: Medicare Other

## 2020-06-02 ENCOUNTER — Other Ambulatory Visit: Payer: Self-pay

## 2020-06-02 VITALS — BP 138/78 | HR 76 | Temp 98.1°F | Resp 20 | Wt 164.2 lb

## 2020-06-02 DIAGNOSIS — Z0000 Encounter for general adult medical examination without abnormal findings: Secondary | ICD-10-CM

## 2020-06-02 NOTE — Patient Instructions (Addendum)
[name] , Thank you for taking time to come for your Medicare Wellness Visit. I appreciate your ongoing commitment to your health goals. Please review the following plan we discussed and let me know if I can assist you in the future.   Screening recommendations/referrals: Colonoscopy: No longer required Mammogram: done 07/13/17 Bone Density: Done 02/09/18 Recommended yearly ophthalmology/optometry visit for glaucoma screening and checkup Recommended yearly dental visit for hygiene and checkup  Vaccinations: Influenza vaccine: Up to date Pneumococcal vaccine: Up to date Tdap vaccine: Not a candidate  Shingles vaccine: Completed 1/30 & 08/21/18   Covid-19:Completed 1/21, 2/11, 11/16/19 & 05/20/20  Advanced directives: Please bring a copy of your health care power of attorney and living will to the office at your convenience.  Conditions/risks identified: Get more exercise   Next appointment: Follow up in one year for your annual wellness visit    Preventive Care 65 Years and Older, Female Preventive care refers to lifestyle choices and visits with your health care provider that can promote health and wellness. What does preventive care include?  A yearly physical exam. This is also called an annual well check.  Dental exams once or twice a year.  Routine eye exams. Ask your health care provider how often you should have your eyes checked.  Personal lifestyle choices, including:  Daily care of your teeth and gums.  Regular physical activity.  Eating a healthy diet.  Avoiding tobacco and drug use.  Limiting alcohol use.  Practicing safe sex.  Taking low-dose aspirin every day.  Taking vitamin and mineral supplements as recommended by your health care provider. What happens during an annual well check? The services and screenings done by your health care provider during your annual well check will depend on your age, overall health, lifestyle risk factors, and family  history of disease. Counseling  Your health care provider may ask you questions about your:  Alcohol use.  Tobacco use.  Drug use.  Emotional well-being.  Home and relationship well-being.  Sexual activity.  Eating habits.  History of falls.  Memory and ability to understand (cognition).  Work and work Statistician.  Reproductive health. Screening  You may have the following tests or measurements:  Height, weight, and BMI.  Blood pressure.  Lipid and cholesterol levels. These may be checked every 5 years, or more frequently if you are over 13 years old.  Skin check.  Lung cancer screening. You may have this screening every year starting at age 66 if you have a 30-pack-year history of smoking and currently smoke or have quit within the past 15 years.  Fecal occult blood test (FOBT) of the stool. You may have this test every year starting at age 56.  Flexible sigmoidoscopy or colonoscopy. You may have a sigmoidoscopy every 5 years or a colonoscopy every 10 years starting at age 51.  Hepatitis C blood test.  Hepatitis B blood test.  Sexually transmitted disease (STD) testing.  Diabetes screening. This is done by checking your blood sugar (glucose) after you have not eaten for a while (fasting). You may have this done every 1-3 years.  Bone density scan. This is done to screen for osteoporosis. You may have this done starting at age 64.  Mammogram. This may be done every 1-2 years. Talk to your health care provider about how often you should have regular mammograms. Talk with your health care provider about your test results, treatment options, and if necessary, the need for more tests. Vaccines  Your  health care provider may recommend certain vaccines, such as:  Influenza vaccine. This is recommended every year.  Tetanus, diphtheria, and acellular pertussis (Tdap, Td) vaccine. You may need a Td booster every 10 years.  Zoster vaccine. You may need this after  age 70.  Pneumococcal 13-valent conjugate (PCV13) vaccine. One dose is recommended after age 30.  Pneumococcal polysaccharide (PPSV23) vaccine. One dose is recommended after age 16. Talk to your health care provider about which screenings and vaccines you need and how often you need them. This information is not intended to replace advice given to you by your health care provider. Make sure you discuss any questions you have with your health care provider. Document Released: 02/21/2015 Document Revised: 10/15/2015 Document Reviewed: 11/26/2014 Elsevier Interactive Patient Education  2017 [place] Prevention in the Home Falls can cause injuries. They can happen to people of all ages. There are many things you can do to make your home safe and to help prevent falls. What can I do on the outside of my home?  Regularly fix the edges of walkways and driveways and fix any cracks.  Remove anything that might make you trip as you walk through a door, such as a raised step or threshold.  Trim any bushes or trees on the path to your home.  Use bright outdoor lighting.  Clear any walking paths of anything that might make someone trip, such as rocks or tools.  Regularly check to see if handrails are loose or broken. Make sure that both sides of any steps have handrails.  Any raised decks and porches should have guardrails on the edges.  Have any leaves, snow, or ice cleared regularly.  Use sand or salt on walking paths during winter.  Clean up any spills in your garage right away. This includes oil or grease spills. What can I do in the bathroom?  Use night lights.  Install grab bars by the toilet and in the tub and shower. Do not use towel bars as grab bars.  Use non-skid mats or decals in the tub or shower.  If you need to sit down in the shower, use a plastic, non-slip stool.  Keep the floor dry. Clean up any water that spills on the floor as soon as it  happens.  Remove soap buildup in the tub or shower regularly.  Attach bath mats securely with double-sided non-slip rug tape.  Do not have throw rugs and other things on the floor that can make you trip. What can I do in the bedroom?  Use night lights.  Make sure that you have a light by your bed that is easy to reach.  Do not use any sheets or blankets that are too big for your bed. They should not hang down onto the floor.  Have a firm chair that has side arms. You can use this for support while you get dressed.  Do not have throw rugs and other things on the floor that can make you trip. What can I do in the kitchen?  Clean up any spills right away.  Avoid walking on wet floors.  Keep items that you use a lot in easy-to-reach places.  If you need to reach something above you, use a strong step stool that has a grab bar.  Keep electrical cords out of the way.  Do not use floor polish or wax that makes floors slippery. If you must use wax, use non-skid floor wax.  Do not  have throw rugs and other things on the floor that can make you trip. What can I do with my stairs?  Do not leave any items on the stairs.  Make sure that there are handrails on both sides of the stairs and use them. Fix handrails that are broken or loose. Make sure that handrails are as long as the stairways.  Check any carpeting to make sure that it is firmly attached to the stairs. Fix any carpet that is loose or worn.  Avoid having throw rugs at the top or bottom of the stairs. If you do have throw rugs, attach them to the floor with carpet tape.  Make sure that you have a light switch at the top of the stairs and the bottom of the stairs. If you do not have them, ask someone to add them for you. What else can I do to help prevent falls?  Wear shoes that:  Do not have high heels.  Have rubber bottoms.  Are comfortable and fit you well.  Are closed at the toe. Do not wear sandals.  If you  use a stepladder:  Make sure that it is fully opened. Do not climb a closed stepladder.  Make sure that both sides of the stepladder are locked into place.  Ask someone to hold it for you, if possible.  Clearly mark and make sure that you can see:  Any grab bars or handrails.  First and last steps.  Where the edge of each step is.  Use tools that help you move around (mobility aids) if they are needed. These include:  Canes.  Walkers.  Scooters.  Crutches.  Turn on the lights when you go into a dark area. Replace any light bulbs as soon as they burn out.  Set up your furniture so you have a clear path. Avoid moving your furniture around.  If any of your floors are uneven, fix them.  If there are any pets around you, be aware of where they are.  Review your medicines with your doctor. Some medicines can make you feel dizzy. This can increase your chance of falling. Ask your doctor what other things that you can do to help prevent falls. This information is not intended to replace advice given to you by your health care provider. Make sure you discuss any questions you have with your health care provider. Document Released: 11/21/2008 Document Revised: 07/03/2015 Document Reviewed: 03/01/2014 Elsevier Interactive Patient Education  2017 [employer].

## 2020-06-02 NOTE — Progress Notes (Signed)
Subjective:   Nicole Jimenez is a 86 y.o. female who presents for Medicare Annual (Subsequent) preventive examination.  Review of Systems      Cardiac Risk Factors include: advanced age (>35men, >39 women);hypertension;dyslipidemia;diabetes mellitus;obesity (BMI >30kg/m2)     Objective:    Today's Vitals   06/02/20 1108 06/02/20 1109  BP: 138/78   Pulse: 76   Resp: 20   Temp: 98.1 F (36.7 C)   SpO2: 94%   Weight: 164 lb 3.2 oz (74.5 kg)   PainSc:  1    Body mass index is 30.03 kg/m.  Advanced Directives 06/02/2020 11/20/2018 07/08/2017  Does Patient Have a Medical Advance Directive? Yes Yes Yes  Type of Paramedic of [place];Living will Living will;Healthcare Power of [place];Living will  Does patient want to make changes to medical advance directive? - No - Patient declined -  Copy of [place] in Chart? No - copy requested No - copy requested No - copy requested    Current Medications (verified) Outpatient Encounter Medications as of 06/02/2020  Medication Sig  . Acetaminophen 500 MG coapsule Take 1,000 mg by mouth as needed.   Marland Kitchen apixaban (ELIQUIS) 2.5 MG TABS tablet Take 1 tablet (2.5 mg total) by mouth 2 (two) times daily.  Marland Kitchen atenolol (TENORMIN) 50 MG tablet TAKE 1 TABLET BY MOUTH EVERY DAY  . atorvastatin (LIPITOR) 40 MG tablet Take 1 tablet (40 mg total) by mouth at bedtime.  . Calcium Carb-Cholecalciferol 600-800 MG-UNIT CHEW Chew 1 each by mouth daily.   . clobetasol (OLUX) 0.05 % topical foam APPLY TO AFFECTED AREA EVERY DAY AT NIGHT  . diclofenac sodium (VOLTAREN) 1 % GEL APPLY 2 G TOPICALLY 4 (FOUR) TIMES DAILY AS NEEDED. **PA DENIED**  . diltiazem (CARDIZEM CD) 180 MG 24 hr capsule TAKE 1 CAPSULE (180 MG TOTAL) BY MOUTH EVERY MORNING.  . Glucosamine-MSM-Hyaluronic Acd (JOINT HEALTH PO) Take 1 tablet by mouth daily.  Marland Kitchen glucose blood (ONETOUCH ULTRA) test strip USE TWICE DAILY TO CHECK  BLOOD SUGAR - E11.40  . Krill Oil 500 MG CAPS Take 500 mg by mouth daily.  Marland Kitchen lisinopril-hydrochlorothiazide (ZESTORETIC) 10-12.5 MG tablet TAKE 1 TABLET BY MOUTH EVERY DAY  . Multiple Vitamins-Minerals (MULTIVITAMIN ADULT PO) Take by mouth.  . TRINTELLIX 10 MG TABS tablet TAKE 1 TABLET BY MOUTH EVERY DAY  . ACIDOPHILUS LACTOBACILLUS PO Take 1 each by mouth daily.  (Patient not taking: Reported on 06/02/2020)   No facility-administered encounter medications on file as of 06/02/2020.    Allergies (verified) Amoxicillin-pot clavulanate, Amoxicillin, and Clarithromycin   History: Past Medical History:  Diagnosis Date  . Arthritis   . Depression   . Diabetes mellitus without complication (HCC)    Borderline  . Diastolic CHF ([place]) 123456   Echo 11/2016, nl EF but LVH and diastolic dysfunction  . GERD (gastroesophageal reflux disease)   . Hyperlipidemia   . Hypertension   . Peripheral arterial disease ([place]) 2018   thromboembolism of femoral artery  . Thyroid disease    subclinical hyperthyroidism managed by endocrinology   Past Surgical History:  Procedure Laterality Date  . FEMORAL ENDARTERECTOMY Right 11/29/2016   Family History  Problem Relation Age of Onset  . Heart attack Mother   . Arthritis Mother   . Diabetes Father   . Heart attack Father   . Hyperlipidemia Father   . Arthritis Sister   . Diabetes Sister  adopted  . Healthy Daughter   . Healthy Son   . Healthy Son   . Other Son        cyst on thyroid   Social History   Socioeconomic History  . Marital status: Widowed    Spouse name: Not on file  . Number of children: Not on file  . Years of education: Not on file  . Highest education level: Not on file  Occupational History  . Occupation: retired Scientist, physiological  Tobacco Use  . Smoking status: Never Smoker  . Smokeless tobacco: Never Used  Vaping Use  . Vaping Use: Never used  Substance and Sexual Activity  . Alcohol use: No  . Drug use: No  .  Sexual activity: Never  Other Topics Concern  . Not on file  Social History Narrative  . Not on file   Social Determinants of Health   Financial Resource Strain: Low Risk   . Difficulty of Paying Living Expenses: Not hard at all  Food Insecurity: No Food Insecurity  . Worried About Charity fundraiser in the Last Year: Never true  . Ran Out of Food in the Last Year: Never true  Transportation Needs: No Transportation Needs  . Lack of Transportation (Medical): No  . Lack of Transportation (Non-Medical): No  Physical Activity: Inactive  . Days of Exercise per Week: 0 days  . Minutes of Exercise per Session: 0 min  Stress: No Stress Concern Present  . Feeling of Stress : Not at all  Social Connections: Moderately Integrated  . Frequency of Communication with Friends and Family: Once a week  . Frequency of Social Gatherings with Friends and Family: Three times a week  . Attends Religious Services: More than 4 times per year  . Active Member of Clubs or Organizations: Yes  . Attends Archivist Meetings: 1 to 4 times per year  . Marital Status: Widowed    Tobacco Counseling Counseling given: Not Answered   Clinical Intake:  Pre-visit preparation completed: Yes  Pain : 0-10 Pain Score: 1  Pain Type: Chronic pain Pain Location: Back Pain Orientation: Lower Pain Descriptors / Indicators: Aching Pain Onset: More than a month ago Pain Frequency: Intermittent     BMI - recorded: 30.03 Nutritional Status: BMI > 30  Obese Nutritional Risks: None Diabetes: Yes CBG done?: No Did pt. bring in CBG monitor from home?: No  How often do you need to have someone help you when you read instructions, pamphlets, or other written materials from your doctor or pharmacy?: 1 - Never  Diabetic?Nutrition Risk Assessment:  Has the patient had any N/V/D within the last 2 months?  No  Does the patient have any non-healing wounds?  No  Has the patient had any unintentional weight  loss or weight gain?  No   Diabetes:  Is the patient diabetic?  Yes  If diabetic, was a CBG obtained today?  No  Did the patient bring in their glucometer from home?  No  How often do you monitor your CBG's? N/A.   Financial Strains and Diabetes Management:  Are you having any financial strains with the device, your supplies or your medication? No .  Does the patient want to be seen by Chronic Care Management for management of their diabetes?  No  Would the patient like to be referred to a Nutritionist or for Diabetic Management?  No   Diabetic Exams:  Diabetic Eye Exam: Completed 10/10/19 Diabetic Foot Exam: Completed 05/28/19  Interpreter Needed?: No  Information entered by :: Charlott Rakes, LPN   Activities of Daily Living In your present state of health, do you have any difficulty performing the following activities: 06/02/2020 05/19/2020  Hearing? Y N  Comment mild loss -  Vision? N N  Difficulty concentrating or making decisions? Y N  Comment memory at times -  Walking or climbing stairs? N N  Dressing or bathing? N N  Doing errands, shopping? N N  Preparing Food and eating ? N -  Using the Toilet? N -  In the past six months, have you accidently leaked urine? N -  Do you have problems with loss of bowel control? N -  Managing your Medications? N -  Managing your Finances? N -  Housekeeping or managing your Housekeeping? N -  Some recent data might be hidden    Patient Care Team: Leamon Arnt, MD as PCP - General (Family Medicine) Roel Cluck, MD as Referring Physician (Ophthalmology) Alanson Aly, MD as Consulting Physician (Endocrinology) Dinah Beers, MD as Referring Physician (Vascular Surgery) Gaynelle Arabian, MD as Consulting Physician (Orthopedic Surgery) Dermatology, [facility] as Consulting Physician  Indicate any recent Medical Services you may have received from other than [facility] providers in the past year (date may be approximate).      Assessment:   This is a routine wellness examination for [place] [facility] [place].  Hearing/Vision screen  Hearing Screening   125Hz  250Hz  500Hz  1000Hz  2000Hz  3000Hz  4000Hz  6000Hz  8000Hz   Right ear:           Left ear:           Comments: Pt stated mild loss in crowds   Vision Screening Comments: Pt follows up with Dr Nigel Bridgeman for annual eye exams   Dietary issues and exercise activities discussed: Current Exercise Habits: The patient does not participate in regular exercise at present  Goals    . Increase physical activity     Increase activity.     . Patient Stated     Start exercising       Depression Screen PHQ 2/9 Scores 06/02/2020 05/19/2020 11/20/2018 02/24/2018 12/02/2017 07/08/2017 03/03/2017  PHQ - 2 Score 1 0 0 2 0 1 2  PHQ- 9 Score - 0 - - 0 3 8    Fall Risk Fall Risk  06/02/2020 05/19/2020 05/28/2019 11/20/2018 02/24/2018  Falls in the past year? 0 0 0 0 0  Comment - - - - -  Number falls in past yr: 0 0 0 - 0  Injury with Fall? 0 0 0 0 0  Risk for fall due to : Impaired vision - - - -  Follow up Falls prevention discussed - - Falls evaluation completed;Education provided;Falls prevention discussed Falls evaluation completed    FALL RISK PREVENTION PERTAINING TO THE HOME:  Any stairs in or around the home? Yes  If so, are there any without handrails? No  Home free of loose throw rugs in walkways, pet beds, electrical cords, etc? Yes  Adequate lighting in your home to reduce risk of falls? Yes   ASSISTIVE DEVICES UTILIZED TO PREVENT FALLS:  Life alert? No  Use of a cane, walker or w/c? No  Grab bars in the bathroom? Yes  Shower chair or bench in shower? No  Elevated toilet seat or a handicapped toilet? Yes   TIMED UP AND GO:  Was the test performed? Yes .  Length of time to ambulate 10 feet: 10 sec.   Gait steady  and fast without use of assistive device  Cognitive Function: MMSE - Mini Mental State Exam 11/20/2018 07/08/2017  Orientation to time 5 5  Orientation to  Place 5 5  Registration 3 3  Attention/ Calculation 5 5  Recall 3 1  Language- name 2 objects 2 2  Language- repeat 1 1  Language- follow 3 step command 3 3  Language- read & follow direction 1 1  Write a sentence 1 1  Copy design 1 1  Total score 30 28     6CIT Screen 06/02/2020  What Year? 0 points  What month? 0 points  Count back from 20 0 points  Months in reverse 0 points  Repeat phrase 0 points    Immunizations Immunization History  Administered Date(s) Administered  . Fluad Quad(high Dose 65+) 11/20/2018  . Influenza Split 01/24/2012  . Influenza, High Dose Seasonal PF 12/02/2013, 01/20/2015, 12/28/2016, 10/26/2019  . Influenza,inj,Quad PF,6+ Mos 11/21/2015, 11/18/2017  . Influenza-Unspecified 01/24/2012, 01/05/2014, 01/05/2014, 01/20/2015  . PFIZER Comirnaty(Gray Top)Covid-19 Tri-Sucrose Vaccine 05/20/2020  . PFIZER(Purple Top)SARS-COV-2 Vaccination 03/01/2019, 03/22/2019, 11/16/2019  . Pneumococcal Conjugate-13 03/06/2015, 03/06/2015  . Pneumococcal Polysaccharide-23 06/18/2016  . Zoster 10/09/2012  . Zoster Recombinat (Shingrix) 03/09/2018, 08/21/2018     Flu Vaccine status: Up to date  Pneumococcal vaccine status: Up to date  Covid-19 vaccine status: Completed vaccines  Qualifies for Shingles Vaccine? Yes   Zostavax completed Yes   Shingrix Completed?: Yes  Screening Tests Health Maintenance  Topic Date Due  . DEXA SCAN  02/10/2020  . FOOT EXAM  05/27/2020  . INFLUENZA VACCINE  09/08/2020  . OPHTHALMOLOGY EXAM  10/09/2020  . HEMOGLOBIN A1C  11/18/2020  . COVID-19 Vaccine  Completed  . PNA vac Low Risk Adult  Completed  . HPV VACCINES  Aged Out    Health Maintenance  Health Maintenance Due  Topic Date Due  . DEXA SCAN  02/10/2020  . FOOT EXAM  05/27/2020    Colorectal cancer screening: No longer required.   Mammogram status: Completed 07/13/17. Repeat every year  Bone Density status: Completed 02/09/18. Results reflect: Bone density  results: OSTEOPENIA. Repeat every 2 years.    Additional Screening:   Vision Screening: Recommended annual ophthalmology exams for early detection of glaucoma and other disorders of the eye. Is the patient up to date with their annual eye exam?  Yes  Who is the provider or what is the name of the office in which the patient attends annual eye exams? Dr Nigel Bridgeman  If pt is not established with a provider, would they like to be referred to a provider to establish care? No .   Dental Screening: Recommended annual dental exams for proper oral hygiene  Community Resource Referral / Chronic Care Management: CRR required this visit?  No   CCM required this visit?  No      Plan:     I have personally reviewed and noted the following in the patient's chart:   . Medical and social history . Use of alcohol, tobacco or illicit drugs  . Current medications and supplements . Functional ability and status . Nutritional status . Physical activity . Advanced directives . List of other physicians . Hospitalizations, surgeries, and ER visits in previous 12 months . Vitals . Screenings to include cognitive, depression, and falls . Referrals and appointments  In addition, I have reviewed and discussed with patient certain preventive protocols, quality metrics, and best practice recommendations. A written personalized care plan for preventive services as well  as general preventive health recommendations were provided to patient.     Willette Brace, LPN   03/24/863   Nurse Notes: None

## 2020-06-11 ENCOUNTER — Other Ambulatory Visit: Payer: Self-pay | Admitting: Family Medicine

## 2020-06-23 ENCOUNTER — Other Ambulatory Visit: Payer: Self-pay | Admitting: Family Medicine

## 2020-07-20 ENCOUNTER — Other Ambulatory Visit: Payer: Self-pay | Admitting: Family Medicine

## 2020-08-13 ENCOUNTER — Other Ambulatory Visit: Payer: Self-pay | Admitting: Family Medicine

## 2020-09-12 ENCOUNTER — Other Ambulatory Visit: Payer: Self-pay | Admitting: Family Medicine

## 2020-10-05 ENCOUNTER — Other Ambulatory Visit: Payer: Self-pay | Admitting: Family Medicine

## 2020-10-17 ENCOUNTER — Other Ambulatory Visit: Payer: Self-pay | Admitting: Family Medicine

## 2020-11-13 ENCOUNTER — Other Ambulatory Visit: Payer: Self-pay

## 2020-11-13 ENCOUNTER — Ambulatory Visit
Admission: RE | Admit: 2020-11-13 | Discharge: 2020-11-13 | Disposition: A | Discharge status: Home / Self Care | Payer: Medicare Other | Source: Ambulatory Visit | Attending: Family Medicine | Admitting: Family Medicine

## 2020-11-13 DIAGNOSIS — Z780 Asymptomatic menopausal state: Secondary | ICD-10-CM | POA: Diagnosis not present

## 2020-11-13 DIAGNOSIS — M8580 Other specified disorders of bone density and structure, unspecified site: Secondary | ICD-10-CM

## 2020-11-13 DIAGNOSIS — M8589 Other specified disorders of bone density and structure, multiple sites: Secondary | ICD-10-CM | POA: Diagnosis not present

## 2020-11-17 ENCOUNTER — Ambulatory Visit: Payer: Medicare Other | Admitting: Family Medicine

## 2020-11-28 ENCOUNTER — Other Ambulatory Visit: Payer: Self-pay

## 2020-11-28 ENCOUNTER — Ambulatory Visit (INDEPENDENT_AMBULATORY_CARE_PROVIDER_SITE_OTHER): Payer: Medicare Other | Admitting: Family Medicine

## 2020-11-28 ENCOUNTER — Encounter: Payer: Self-pay | Admitting: Family Medicine

## 2020-11-28 VITALS — BP 136/88 | HR 84 | Temp 98.0°F | Ht 62.0 in | Wt 167.0 lb

## 2020-11-28 DIAGNOSIS — F329 Major depressive disorder, single episode, unspecified: Secondary | ICD-10-CM

## 2020-11-28 DIAGNOSIS — M19041 Primary osteoarthritis, right hand: Secondary | ICD-10-CM | POA: Diagnosis not present

## 2020-11-28 DIAGNOSIS — E1142 Type 2 diabetes mellitus with diabetic polyneuropathy: Secondary | ICD-10-CM | POA: Diagnosis not present

## 2020-11-28 DIAGNOSIS — M19042 Primary osteoarthritis, left hand: Secondary | ICD-10-CM

## 2020-11-28 DIAGNOSIS — M8580 Other specified disorders of bone density and structure, unspecified site: Secondary | ICD-10-CM | POA: Diagnosis not present

## 2020-11-28 DIAGNOSIS — I10 Essential (primary) hypertension: Secondary | ICD-10-CM

## 2020-11-28 DIAGNOSIS — M654 Radial styloid tenosynovitis [de Quervain]: Secondary | ICD-10-CM

## 2020-11-28 DIAGNOSIS — I480 Paroxysmal atrial fibrillation: Secondary | ICD-10-CM

## 2020-11-28 DIAGNOSIS — Z7901 Long term (current) use of anticoagulants: Secondary | ICD-10-CM

## 2020-11-28 DIAGNOSIS — Z23 Encounter for immunization: Secondary | ICD-10-CM | POA: Diagnosis not present

## 2020-11-28 DIAGNOSIS — R002 Palpitations: Secondary | ICD-10-CM

## 2020-11-28 DIAGNOSIS — I5032 Chronic diastolic (congestive) heart failure: Secondary | ICD-10-CM | POA: Diagnosis not present

## 2020-11-28 DIAGNOSIS — E1169 Type 2 diabetes mellitus with other specified complication: Secondary | ICD-10-CM | POA: Diagnosis not present

## 2020-11-28 DIAGNOSIS — E782 Mixed hyperlipidemia: Secondary | ICD-10-CM

## 2020-11-28 LAB — POCT GLYCOSYLATED HEMOGLOBIN (HGB A1C): Hemoglobin A1C: 7 % — AB (ref 4.0–5.6)

## 2020-11-28 MED ORDER — ALENDRONATE SODIUM 70 MG PO TABS: 70.0000 mg | ORAL_TABLET | ORAL | 3 refills | Status: DC

## 2020-11-28 NOTE — Patient Instructions (Signed)
Please return in 3 months for diabetes follow up   You were given your flu shot today.  Please get your diabetic eye exam and have the results sent to me.   If you have any questions or concerns, please don't hesitate to send me a message via MyChart or call the office at [phone]. Thank you for visiting with [place] today! It's our pleasure caring for you.   De Quervain's Tenosynovitis De Quervain's tenosynovitis is a condition that causes inflammation of the tendon on the thumb side of the wrist. Tendons are cords of tissue that connect bones to muscles. The tendons in the hand pass through a tunnel called a sheath. A slippery layer of tissue (synovium) lets the tendons move smoothly in the sheath. With de Quervain's tenosynovitis, the sheath swells or thickens, causing friction and pain. The condition is also called de Quervain's disease and de Quervain's syndrome. It occurs most often in women who are 21-61 years old. What are the causes? The exact cause of this condition is not known. It may be associated with overuse of the hand and wrist. What increases the risk? You are more likely to develop this condition if you: Use your hands far more than normal, especially if you repeat certain movements that involve twisting your hand or using a tight grip. Are pregnant. Are a middle-aged woman. Have rheumatoid arthritis. Have diabetes. What are the signs or symptoms? The main symptom of this condition is pain on the thumb side of the wrist. The pain may get worse when you grasp something or turn your wrist. Other symptoms may include: Pain that extends up the forearm. Swelling of your wrist and hand. Trouble moving the thumb and wrist. A sensation of snapping in the wrist. A bump filled with fluid (cyst) in the area of the pain. How is this diagnosed? This condition may be diagnosed based on: Your symptoms and medical history. A physical exam. During the exam, your health care provider  may do a simple test W[place] test) that involves pulling your thumb and wrist to see if this causes pain. You may also need to have an X-ray or ultrasound. How is this treated? Treatment for this condition may include: Avoiding any activity that causes pain and swelling. Taking medicines. Anti-inflammatory medicines and corticosteroid injections may be used to reduce inflammation and relieve pain. Wearing a splint. Having surgery. This may be needed if other treatments do not work. Once the pain and swelling have gone down, you may start: Physical therapy. This includes exercises to improve movement and strength in your wrist and thumb. Occupational therapy. This includes adjusting how you move your wrist. Follow these instructions at home: If you have a splint: Wear the splint as told by your health care provider. Remove it only as told by your health care provider. Loosen the splint if your fingers tingle, become numb, or turn cold and blue. Keep the splint clean. If the splint is not waterproof: Do not let it get wet. Cover it with a watertight covering when you take a bath or a shower. Managing pain, stiffness, and swelling  Avoid movements and activities that cause pain and swelling in the wrist area. If directed, put ice on the painful area. This may be helpful after doing activities that involve the sore wrist. To do this: Put ice in a plastic bag. Place a towel between your skin and the bag. Leave the ice on for 20 minutes, 2-3 times a day. Remove the ice  if your skin turns bright red. This is very important. If you cannot feel pain, heat, or cold, you have a greater risk of damage to the area. Move your fingers often to reduce stiffness and swelling. Raise (elevate) the injured area above the level of your heart while you are sitting or lying down. General instructions Return to your normal activities as told by your health care provider. Ask your health care provider what  activities are safe for you. Take over-the-counter and prescription medicines only as told by your health care provider. Keep all follow-up visits. This is important. Contact a health care provider if: Your pain medicine does not help. Your pain gets worse. You develop new symptoms. Summary De Quervain's tenosynovitis is a condition that causes inflammation of the tendon on the thumb side of the wrist. The condition occurs most often in women who are 27-75 years old. The exact cause of this condition is not known. It may be associated with overuse of the hand and wrist. Treatment starts with avoiding activity that causes pain or swelling in the wrist area. Other treatments may include wearing a splint and taking medicine. Sometimes, surgery is needed. This information is not intended to replace advice given to you by your health care provider. Make sure you discuss any questions you have with your health care provider. Document Revised: 05/09/2019 Document Reviewed: 05/09/2019 Elsevier Patient Education  2022 [employer].

## 2020-11-28 NOTE — Progress Notes (Signed)
Subjective  CC:  Chief Complaint  Patient presents with   Hypertension   Hyperlipidemia   Diabetes   Depression    HPI: Nicole Jimenez is a 86 y.o. female who presents to the office today for follow up of diabetes and problems listed above in the chief complaint.  Diabetes follow up: Her diabetic control is reported as Unchanged. She has been diet controlled. Eating a few more desserts now because her son is living with her. No sxs of hyperglycemia. She denies exertional CP or SOB or symptomatic hypoglycemia. She denies foot sores but now c/o paresthesias bilateral feet: "feels like something is on the foot even though nothing is there". No pain. Ongoing for a few months. Eye exam is scheduled for next month. On ARB.  HTN: Feeling well. Taking medications w/o adverse effects. No symptoms of CHF, angina; no palpitations, sob, cp or lower extremity edema. Compliant with meds.  HLD: on lipitor 40 and ldl is controlled. No myalgias C/o pain in left wrist with certain movements. Had OA in hands and bilateral 1st MCP but this is worsening. No overuse activities. Ongoing x 2 weeks.  PAF: denies racing heart or palpitations. No cp. No sob. On eloquis. Has mild LE edema. On hctz 12.5 daily.  Osteopenia w/ elevated FRAX score: reviewed recent dexa scan results; progressive bone density loss; lowest T = -2.3 now. However very elevated FRAX scores. Admits fell last week in garage: slipped on oil spot. Landed on left side and bruised face. No h/o fractures. Doesn't regularly exercise but is inquiring about [facility].   Wt Readings from Last 3 Encounters:  11/28/20 167 lb (75.8 kg)  06/02/20 164 lb 3.2 oz (74.5 kg)  05/19/20 161 lb 3.2 oz (73.1 kg)    BP Readings from Last 3 Encounters:  11/28/20 136/88  06/02/20 138/78  05/19/20 (!) 144/82    Assessment  1. Type 2 diabetes mellitus with peripheral neuropathy (HCC)   2. Essential hypertension   3. Osteopenia, unspecified location   4.  Primary osteoarthritis of both hands   5. De Quervain's tenosynovitis, left   6. Paroxysmal atrial fibrillation (HCC)   7. Combined hyperlipidemia associated with type 2 diabetes mellitus ([place])   8. Need for immunization against influenza   9. Chronic diastolic congestive heart failure ([place])   10. Major depression, chronic   11. Anticoagulant long-term use      Plan  Diabetes is currently marginally controlled. Will improve diet and recechk in 3 months. If A1c is trending upward > 7.0, will start meds. Flu shot today. Eye exam next month (macular degen). New periph neuropathy sxs. Nl foot exam today HTN is controlled. On ARB/hctz and CCB Afib: rate controlled. Reviewed EKG. Asymptomatic. On eloquis. Chf, diastolic with mild leg edema. Elevate legs, monitor. May warrant diuresis if worsens.  HLD on atrovastatin is controlle.d 40mg  nightly.  De quervains: pt defers steroid injection; will splint, voltaren gel and ice. F/u if not improving. Can use voltaren for OA in hands as well Depression is well controlled. On trintellix Osteopenia: educated on need for treatment: fosamx started. Discussed risks/benefits and directions for use. Recommend exercise.   I spent a total of 55 minutes for this patient encounter. Time spent included preparation, face-to-face counseling with the patient and coordination of care, review of chart and records, and documentation of the encounter.  Follow up: 3 mo for diabetes recheck. Orders Placed This Encounter  Procedures   Flu Vaccine QUAD High Dose(Fluad)  POCT HgB A1C   EKG 12-Lead   Meds ordered this encounter  Medications   alendronate (FOSAMAX) 70 MG tablet    Sig: Take 1 tablet (70 mg total) by mouth once a week. Take with a full glass of water on an empty stomach.    Dispense:  12 tablet    Refill:  3      Immunization History  Administered Date(s) Administered   Fluad Quad(high Dose 65+) 11/20/2018, 11/28/2020   Influenza Split 01/24/2012    Influenza, High Dose Seasonal PF 12/02/2013, 01/20/2015, 12/28/2016, 10/26/2019   Influenza,inj,Quad PF,6+ Mos 11/21/2015, 11/18/2017   Influenza-Unspecified 01/24/2012, 01/05/2014, 01/05/2014, 01/20/2015   PFIZER Comirnaty(Gray Top)Covid-19 Tri-Sucrose Vaccine 05/20/2020   PFIZER(Purple Top)SARS-COV-2 Vaccination 03/01/2019, 03/22/2019, 11/16/2019   Pneumococcal Conjugate-13 03/06/2015, 03/06/2015   Pneumococcal Polysaccharide-23 06/18/2016   Zoster Recombinat (Shingrix) 03/09/2018, 08/21/2018   Zoster, Live 10/09/2012    Diabetes Related Lab Review: Lab Results  Component Value Date   HGBA1C 7.0 (A) 11/28/2020   HGBA1C 6.4 (A) 05/19/2020   HGBA1C 6.8 (A) 08/27/2019    Lab Results  Component Value Date   MICROALBUR 3.2 (H) 08/27/2019   Lab Results  Component Value Date   CREATININE 0.98 05/19/2020   BUN 22 05/19/2020   NA 141 05/19/2020   K 4.5 05/19/2020   CL 100 05/19/2020   CO2 31 05/19/2020   Lab Results  Component Value Date   CHOL 175 05/19/2020   CHOL 163 02/27/2019   CHOL 156 02/24/2018   Lab Results  Component Value Date   HDL 75.10 05/19/2020   HDL 68.70 02/27/2019   HDL 59.60 02/24/2018   Lab Results  Component Value Date   LDLCALC 76 05/19/2020   LDLCALC 67 02/27/2019   LDLCALC 60 02/24/2018   Lab Results  Component Value Date   TRIG 116.0 05/19/2020   TRIG 135.0 02/27/2019   TRIG 181.0 (H) 02/24/2018   Lab Results  Component Value Date   CHOLHDL 2 05/19/2020   CHOLHDL 2 02/27/2019   CHOLHDL 3 02/24/2018   No results found for: LDLDIRECT The ASCVD Risk score (Arnett DK, et al., 2019) failed to calculate for the following reasons:   The 2019 ASCVD risk score is only valid for ages 68 to 40 I have reviewed the Reader, Fam and Soc history. Patient Active Problem List   Diagnosis Date Noted   History of femoral artery thrombosis - 2018 08/21/2018    Priority: 1.    Managed by vascular. 2018    Anticoagulant long-term use 03/06/2018     Priority: 1.   Paroxysmal atrial fibrillation ([place]) 01/23/2018    Priority: 1.   Diastolic CHF ([place]) 71/07/2692    Priority: 1.    Echo 11/2016, nl EF but LVH and diastolic dysfunction     Type 2 diabetes mellitus with peripheral neuropathy ([place]) 02-10-17    Priority: 1.   Subclinical hyperthyroidism 01/21/2014    Priority: 1.    Managed by endocrine    Essential hypertension 02/23/2013    Priority: 1.   Combined hyperlipidemia associated with type 2 diabetes mellitus ([place]) 02/23/2013    Priority: 1.   Major depression, chronic 07/18/2012    Priority: 1.   DJD of right shoulder 02/10/2017    Priority: 2.   Bereavement due to life event 03/05/2016    Priority: 2.    Her husband of 51 years died in 02-11-16     Osteopenia 08/23/2015    Priority: 2.  DEXA 11/2020: lowest T =-2.3, left femoral neck, elevated FRAX score Major Osteoporotic Fracture: 29.4% Hip Fracture:                19.1% Recommend tx.  dexa 2017: T = -1.0 at femur, elevated FRAX score.  Repeat 2019 Osteopenia with elevated hip frac score of 10%:  T lowest = - 1.8 Major Osteoporotic Fracture: 17.1% Hip Fracture:                10.4% Population:                  [place] ([place]) Risk Factors:                Family Hist. (Parent hip fracture) Offer medications due to elevated hip fracture risk score.     Idiopathic scoliosis of thoracic spine 07/09/2014    Priority: 2.   Sjogren's syndrome ([place]) 08/04/2012    Priority: 2.   DDD (degenerative disc disease), lumbosacral 07/15/2012    Priority: 2.   Lumbosacral spondylosis 07/15/2012    Priority: 2.   Osteoarthritis of knee 07/15/2012    Priority: 2.   Bilateral edema of lower extremity 01/26/2017    Priority: 3.   Bilateral hip bursitis 07/17/2015    Priority: 3.   Age-related nuclear cataract of both eyes 03/11/2015    Priority: 3.   Insomnia 09/04/2013    Priority: 3.   Allergic rhinitis 07/15/2012    Priority: 3.    Social History: Patient   reports that she has never smoked. She has never used smokeless tobacco. She reports that she does not drink alcohol and does not use drugs.  Review of Systems: Ophthalmic: negative for eye pain, loss of vision or double vision Cardiovascular: negative for chest pain Respiratory: negative for SOB or persistent cough Gastrointestinal: negative for abdominal pain Genitourinary: negative for dysuria or gross hematuria MSK: negative for foot lesions Neurologic: negative for weakness or gait disturbance  Objective  Vitals: BP 136/88   Pulse 84   Temp 98 F (36.7 C) (Temporal)   Ht 5\' 2"  (1.575 m)   Wt 167 lb (75.8 kg)   SpO2 94%   BMI 30.54 kg/m  General: well appearing, no acute distress  Psych:  Alert and oriented, normal mood and affect HEENT:  Normocephalic, atraumatic, moist mucous membranes, supple neck  Cardiovascular:  irreg irre w/o gallop or rub. Tr Bilateral edema Respiratory:  Good breath sounds bilaterally, CTAB with normal effort, no rales Skin:  Warm, no rashes Neurologic:   Mental status is normal.  Foot exam: no erythema, pallor, or cyanosis visible nl proprioception and sensation to monofilament testing bilaterally, +2 distal pulses bilaterally  Diabetic education: ongoing education regarding chronic disease management for diabetes was given today. We continue to reinforce the ABC's of diabetic management: A1c (<7 or 8 dependent upon patient), tight blood pressure control, and cholesterol management with goal LDL < 100 minimally. We discuss diet strategies, exercise recommendations, medication options and possible side effects. At each visit, we review recommended immunizations and preventive care recommendations for diabetics and stress that good diabetic control can prevent other problems. See below for this patient's data.   Commons side effects, risks, benefits, and alternatives for medications and treatment plan prescribed today were discussed, and the patient  expressed understanding of the given instructions. Patient is instructed to call or message via MyChart if he/she has any questions or concerns regarding our treatment plan. No barriers to understanding were identified. We  discussed Red Flag symptoms and signs in detail. Patient expressed understanding regarding what to do in case of urgent or emergency type symptoms.  Medication list was reconciled, printed and provided to the patient in AVS. Patient instructions and summary information was reviewed with the patient as documented in the AVS. This note was prepared with assistance of Dragon voice recognition software. Occasional wrong-word or sound-a-like substitutions may have occurred due to the inherent limitations of voice recognition software  This visit occurred during the SARS-CoV-2 public health emergency.  Safety protocols were in place, including screening questions prior to the visit, additional usage of staff PPE, and extensive cleaning of exam room while observing appropriate contact time as indicated for disinfecting solutions.

## 2020-11-29 NOTE — Progress Notes (Signed)
Afib w/o acute changes

## 2020-12-16 DIAGNOSIS — Z23 Encounter for immunization: Secondary | ICD-10-CM | POA: Diagnosis not present

## 2020-12-25 DIAGNOSIS — H26491 Other secondary cataract, right eye: Secondary | ICD-10-CM | POA: Diagnosis not present

## 2020-12-25 DIAGNOSIS — H35371 Puckering of macula, right eye: Secondary | ICD-10-CM | POA: Diagnosis not present

## 2020-12-25 DIAGNOSIS — H02831 Dermatochalasis of right upper eyelid: Secondary | ICD-10-CM | POA: Diagnosis not present

## 2020-12-25 DIAGNOSIS — H43813 Vitreous degeneration, bilateral: Secondary | ICD-10-CM | POA: Diagnosis not present

## 2020-12-25 DIAGNOSIS — E119 Type 2 diabetes mellitus without complications: Secondary | ICD-10-CM | POA: Diagnosis not present

## 2020-12-25 DIAGNOSIS — H11132 Conjunctival pigmentations, left eye: Secondary | ICD-10-CM | POA: Diagnosis not present

## 2020-12-25 DIAGNOSIS — H524 Presbyopia: Secondary | ICD-10-CM | POA: Diagnosis not present

## 2020-12-25 DIAGNOSIS — H02834 Dermatochalasis of left upper eyelid: Secondary | ICD-10-CM | POA: Diagnosis not present

## 2020-12-25 DIAGNOSIS — H35362 Drusen (degenerative) of macula, left eye: Secondary | ICD-10-CM | POA: Diagnosis not present

## 2020-12-25 DIAGNOSIS — H52203 Unspecified astigmatism, bilateral: Secondary | ICD-10-CM | POA: Diagnosis not present

## 2020-12-25 DIAGNOSIS — Z961 Presence of intraocular lens: Secondary | ICD-10-CM | POA: Diagnosis not present

## 2020-12-30 DIAGNOSIS — I480 Paroxysmal atrial fibrillation: Secondary | ICD-10-CM | POA: Diagnosis not present

## 2020-12-30 DIAGNOSIS — I10 Essential (primary) hypertension: Secondary | ICD-10-CM | POA: Diagnosis not present

## 2021-02-17 DIAGNOSIS — H26491 Other secondary cataract, right eye: Secondary | ICD-10-CM | POA: Diagnosis not present

## 2021-02-27 ENCOUNTER — Other Ambulatory Visit: Payer: Self-pay

## 2021-02-27 ENCOUNTER — Ambulatory Visit (INDEPENDENT_AMBULATORY_CARE_PROVIDER_SITE_OTHER): Payer: Medicare Other | Admitting: Family

## 2021-02-27 ENCOUNTER — Encounter: Payer: Self-pay | Admitting: Family

## 2021-02-27 VITALS — BP 184/104 | HR 87 | Temp 97.6°F | Ht 62.0 in | Wt 165.4 lb

## 2021-02-27 DIAGNOSIS — R21 Rash and other nonspecific skin eruption: Secondary | ICD-10-CM | POA: Insufficient documentation

## 2021-02-27 DIAGNOSIS — L299 Pruritus, unspecified: Secondary | ICD-10-CM | POA: Insufficient documentation

## 2021-02-27 MED ORDER — TRIAMCINOLONE ACETONIDE 0.1 % EX CREA: 1.0000 "application " | TOPICAL_CREAM | Freq: Two times a day (BID) | CUTANEOUS | 0 refills | Status: DC

## 2021-02-27 NOTE — Assessment & Plan Note (Signed)
advised to continue using a moisturizing or hypoallergenic body wash, then apply the body cream generic CeraVe or Cetaphil all over, drink at least 64oz of water daily. OK to continue Benadryl, 1 pill qhs for continued severe itching only, f/u with PCP if this is not resolving.

## 2021-02-27 NOTE — Assessment & Plan Note (Addendum)
left knee pinpoint red diffuse rash. had been using biofreeze for pain several times per day, but stopped about a week ago, rash did not change. eczema patch on right antecubital. sending Triamcinolone cream for left knee and right elbow rashes, advised to use before applying body cream.

## 2021-02-27 NOTE — Progress Notes (Signed)
Subjective:     Patient ID: Nicole Jimenez, female    DOB: 1934/09/03, 87 y.o.   MRN: [id number]  Chief Complaint  Patient presents with   Rash    2-3 weeks, Mostly on left leg. She complains of itching all over her body. She states that she has used [employer] a lot lately due to the cold weather and may have been the reason why it has broken out. She has taken a Benadryl, but has not subsided.    Dry Skin    2-3 weeks; She states that she has used OTC lotions, but her skin is very dry.     HPI: Rash: Patient complains of rash involving the  right elbow and left knee . Rash started 3 weeks ago. Appearance of rash at onset: Color of lesion(s): red, Size of lesion(s): pinpoint on left knee, scaly on left antecubital. Rash has not changed over time   Discomfort associated with rash: is pruritic.  Associated symptoms: none.  Patient has not had previous evaluation of rash. Patient has not had previous treatment.  Patient has not had contacts with similar rash. Patient thinks Biofreeze is the identified precipitant for her left knee. Patient has not had new exposures (soaps, lotions, laundry detergents, foods, medications, plants, insects or animals.)   Health Maintenance Due  Topic Date Due   OPHTHALMOLOGY EXAM  10/09/2020    Past Medical History:  Diagnosis Date   Arthritis    Depression    Diabetes mellitus without complication (HCC)    Borderline   Diastolic CHF ([place]) 6/65/9935   Echo 11/2016, nl EF but LVH and diastolic dysfunction   GERD (gastroesophageal reflux disease)    Hyperlipidemia    Hypertension    Peripheral arterial disease ([place]) 2018   thromboembolism of femoral artery   Thyroid disease    subclinical hyperthyroidism managed by endocrinology    Past Surgical History:  Procedure Laterality Date   FEMORAL ENDARTERECTOMY Right 11/29/2016    Outpatient Medications Prior to Visit  Medication Sig Dispense Refill   Acetaminophen 500 MG coapsule Take 1,000 mg  by mouth as needed.      alendronate (FOSAMAX) 70 MG tablet Take 1 tablet (70 mg total) by mouth once a week. Take with a full glass of water on an empty stomach. 12 tablet 3   apixaban (ELIQUIS) 2.5 MG TABS tablet Take 1 tablet (2.5 mg total) by mouth 2 (two) times daily. 180 tablet 3   atenolol (TENORMIN) 50 MG tablet TAKE 1 TABLET BY MOUTH EVERY DAY 90 tablet 3   atorvastatin (LIPITOR) 40 MG tablet TAKE 1 TABLET BY MOUTH EVERYDAY AT BEDTIME 90 tablet 3   Calcium Carb-Cholecalciferol 600-800 MG-UNIT CHEW Chew 1 each by mouth daily.      diclofenac sodium (VOLTAREN) 1 % GEL APPLY 2 G TOPICALLY 4 (FOUR) TIMES DAILY AS NEEDED. **PA DENIED** 200 g 1   diltiazem (CARDIZEM CD) 180 MG 24 hr capsule TAKE 1 CAPSULE (180 MG TOTAL) BY MOUTH EVERY MORNING. 90 capsule 3   Glucosamine-MSM-Hyaluronic Acd (JOINT HEALTH PO) Take 1 tablet by mouth daily.     glucose blood (ONETOUCH ULTRA) test strip USE TWICE DAILY TO CHECK BLOOD SUGAR - E11.40 100 strip 1   Krill Oil 500 MG CAPS Take 500 mg by mouth daily.     lisinopril-hydrochlorothiazide (ZESTORETIC) 10-12.5 MG tablet TAKE 1 TABLET BY MOUTH EVERY DAY 90 tablet 3   Multiple Vitamins-Minerals (MULTIVITAMIN ADULT PO) Take by mouth.  TRINTELLIX 10 MG TABS tablet TAKE 1 TABLET BY MOUTH EVERY DAY 30 tablet 2   No facility-administered medications prior to visit.    Allergies  Allergen Reactions   Amoxicillin-Pot Clavulanate Other (See Comments)   Amoxicillin Other (See Comments)   Clarithromycin Other (See Comments)        Objective:    Physical Exam Vitals and nursing note reviewed.  Constitutional:      Appearance: Normal appearance.  Cardiovascular:     Rate and Rhythm: Normal rate and regular rhythm.  Pulmonary:     Effort: Pulmonary effort is normal.     Breath sounds: Normal breath sounds.  Musculoskeletal:        General: Normal range of motion.  Skin:    General: Skin is warm and dry.     Findings: Rash (left knee) present. Rash is  scaling (right antecubital).  Neurological:     Mental Status: She is alert.  Psychiatric:        Mood and Affect: Mood normal.        Behavior: Behavior normal.    BP (!) 184/104 Comment: Pt says that she has been stressed recently.   Pulse 87    Temp 97.6 F (36.4 C) (Temporal)    Ht 5\' 2"  (1.575 m)    Wt 165 lb 6.4 oz (75 kg)    SpO2 97%    BMI 30.25 kg/m  Wt Readings from Last 3 Encounters:  02/27/21 165 lb 6.4 oz (75 kg)  11/28/20 167 lb (75.8 kg)  06/02/20 164 lb 3.2 oz (74.5 kg)       Assessment & Plan:   Problem List Items Addressed This Visit       Musculoskeletal and Integument   Skin rash - Primary    left knee pinpoint red diffuse rash. had been using biofreeze for pain several times per day, but stopped about a week ago, rash did not change. eczema patch on right antecubital. sending Triamcinolone cream for left knee and right elbow rashes, advised to use before applying body cream.      Relevant Medications   triamcinolone cream (KENALOG) 0.1 %   Skin pruritus    advised to continue using a moisturizing or hypoallergenic body wash, then apply the body cream generic CeraVe or Cetaphil all over, drink at least 64oz of water daily. OK to continue Benadryl, 1 pill qhs for continued severe itching only, f/u with PCP if this is not resolving.        Meds ordered this encounter  Medications   triamcinolone cream (KENALOG) 0.1 %    Sig: Apply 1 application topically 2 (two) times daily. to rash areas.    Dispense:  30 g    Refill:  0    Order Specific Question:   Supervising Provider    Answer:   ANDY, CAMILLE L [[id number]]

## 2021-02-27 NOTE — Patient Instructions (Signed)
It was very nice to see you today!  I have sent a steroid cream to your pharmacy to use on your right elbow and left knee, after you shower and BEFORE using the Cetaphil or CeraVe cream.  Continue using the Dove moisturizer body wash and one of the above creams after you towel dry. If overall itching continues, you may want to switch to [facility] Hypoallergenic body wash.  Drink at least 64oz of water daily!     PLEASE NOTE:  If you had any lab tests please let us know if you have not heard back within a few days. You may see your results on MyChart before we have a chance to review them but we will give you a call once they are reviewed by [place]. If we ordered any referrals today, please let us know if you have not heard from their office within the next week.   Please try these tips to maintain a healthy lifestyle:  Eat most of your calories during the day when you are active. Eliminate processed foods including packaged sweets (pies, cakes, cookies), reduce intake of potatoes, white bread, white pasta, and white rice. Look for whole grain options, oat flour or almond flour.  Each meal should contain half fruits/vegetables, one quarter protein, and one quarter carbs (no bigger than a computer mouse).  Cut down on sweet beverages. This includes juice, soda, and sweet tea. Also watch fruit intake, though this is a healthier sweet option, it still contains natural sugar! Limit to 3 servings daily.  Drink at least 1 glass of water with each meal and aim for at least 8 glasses per day  Exercise at least 150 minutes every week.

## 2021-03-06 ENCOUNTER — Encounter: Payer: Self-pay | Admitting: Family Medicine

## 2021-03-06 ENCOUNTER — Other Ambulatory Visit: Payer: Self-pay

## 2021-03-06 ENCOUNTER — Ambulatory Visit (INDEPENDENT_AMBULATORY_CARE_PROVIDER_SITE_OTHER): Payer: Medicare Other | Admitting: Family Medicine

## 2021-03-06 VITALS — BP 160/98 | HR 80 | Temp 98.0°F | Ht 61.0 in | Wt 165.0 lb

## 2021-03-06 DIAGNOSIS — R052 Subacute cough: Secondary | ICD-10-CM

## 2021-03-06 DIAGNOSIS — E1142 Type 2 diabetes mellitus with diabetic polyneuropathy: Secondary | ICD-10-CM | POA: Diagnosis not present

## 2021-03-06 DIAGNOSIS — I10 Essential (primary) hypertension: Secondary | ICD-10-CM

## 2021-03-06 DIAGNOSIS — I5032 Chronic diastolic (congestive) heart failure: Secondary | ICD-10-CM

## 2021-03-06 DIAGNOSIS — M3500 Sicca syndrome, unspecified: Secondary | ICD-10-CM

## 2021-03-06 DIAGNOSIS — I480 Paroxysmal atrial fibrillation: Secondary | ICD-10-CM | POA: Diagnosis not present

## 2021-03-06 LAB — POCT GLYCOSYLATED HEMOGLOBIN (HGB A1C): Hemoglobin A1C: 7.4 % — AB (ref 4.0–5.6)

## 2021-03-06 MED ORDER — LISINOPRIL-HYDROCHLOROTHIAZIDE 20-25 MG PO TABS: 1.0000 | ORAL_TABLET | Freq: Every day | ORAL | 3 refills | Status: DC

## 2021-03-06 MED ORDER — METFORMIN HCL ER 500 MG PO TB24: 500.0000 mg | ORAL_TABLET | Freq: Every day | ORAL | 3 refills | Status: DC

## 2021-03-06 NOTE — Progress Notes (Signed)
Subjective  CC:  Chief Complaint  Patient presents with   Diabetes    Pt blood sugar this morning was 154.   Hypertension    HPI: Nicole Jimenez is a 87 y.o. female who presents to the office today for follow up of diabetes and problems listed above in the chief complaint.  Diabetes follow up: Her diabetic control is reported as Unchanged. Diet remains fair ... did enjoy the holidays with some indiscretions.  She denies exertional CP or SOB or symptomatic hypoglycemia. She denies foot sores or paresthesias.  HTN: recheck today shows remains high but feels fine.  C/o phlegm and cough. Needs to clear throat. No upper abdominal pain. Not sick. Has been chronic intermittently for last 1-2 months.  Sjogrens; no concerns. PAF. No palpitations or cp. No neuro sxs. Tolerating eloquis bid.   Wt Readings from Last 3 Encounters:  03/06/21 165 lb (74.8 kg)  02/27/21 165 lb 6.4 oz (75 kg)  11/28/20 167 lb (75.8 kg)    BP Readings from Last 3 Encounters:  03/06/21 (!) 160/98  02/27/21 (!) 184/104  11/28/20 136/88    Assessment  1. Type 2 diabetes mellitus with peripheral neuropathy (HCC)   2. Essential hypertension   3. Sjogren's syndrome without extraglandular involvement ([place]) Chronic  4. Chronic diastolic congestive heart failure (HCC) Chronic  5. Paroxysmal atrial fibrillation (HCC) Chronic     Plan  Diabetes is currently marginally controlled. Will start low dose met xr 500 daily. Education given.  HTN; uncontrolled. Increase dose of zestoretic. Low salt and recheck in 6 weeks.  Subacute cough: treate for allergies and monitor  Sjogrens and PAF are stable.   I spent a total of 42 minutes for this patient encounter. Time spent included preparation, face-to-face counseling with the patient and coordination of care, review of chart and records, and documentation of the encounter.  Follow up: 6 weeks for bp and 3 mo for cpe. Orders Placed This Encounter  Procedures   POCT HgB  A1C   No orders of the defined types were placed in this encounter.     Immunization History  Administered Date(s) Administered   Fluad Quad(high Dose 65+) 11/20/2018, 11/28/2020   Influenza Split 01/24/2012   Influenza, High Dose Seasonal PF 12/02/2013, 01/20/2015, 12/28/2016, 10/26/2019   Influenza,inj,Quad PF,6+ Mos 11/21/2015, 11/18/2017   Influenza-Unspecified 01/24/2012, 01/05/2014, 01/05/2014, 01/20/2015   PFIZER Comirnaty(Gray Top)Covid-19 Tri-Sucrose Vaccine 05/20/2020   PFIZER(Purple Top)SARS-COV-2 Vaccination 03/01/2019, 03/22/2019, 11/16/2019   Pfizer Covid-19 Vaccine Bivalent Booster 36yr & up 12/16/2020   Pneumococcal Conjugate-13 03/06/2015, 03/06/2015   Pneumococcal Polysaccharide-23 06/18/2016   Zoster Recombinat (Shingrix) 03/09/2018, 08/21/2018   Zoster, Live 10/09/2012    Diabetes Related Lab Review: Lab Results  Component Value Date   HGBA1C 7.4 (A) 03/06/2021   HGBA1C 7.0 (A) 11/28/2020   HGBA1C 6.4 (A) 05/19/2020    Lab Results  Component Value Date   MICROALBUR 3.2 (H) 08/27/2019   Lab Results  Component Value Date   CREATININE 0.98 05/19/2020   BUN 22 05/19/2020   NA 141 05/19/2020   K 4.5 05/19/2020   CL 100 05/19/2020   CO2 31 05/19/2020   Lab Results  Component Value Date   CHOL 175 05/19/2020   CHOL 163 02/27/2019   CHOL 156 02/24/2018   Lab Results  Component Value Date   HDL 75.10 05/19/2020   HDL 68.70 02/27/2019   HDL 59.60 02/24/2018   Lab Results  Component Value Date   LDLCALC 76 05/19/2020  LDLCALC 67 02/27/2019   LDLCALC 60 02/24/2018   Lab Results  Component Value Date   TRIG 116.0 05/19/2020   TRIG 135.0 02/27/2019   TRIG 181.0 (H) 02/24/2018   Lab Results  Component Value Date   CHOLHDL 2 05/19/2020   CHOLHDL 2 02/27/2019   CHOLHDL 3 02/24/2018   No results found for: LDLDIRECT The ASCVD Risk score (Arnett DK, et al., 2019) failed to calculate for the following reasons:   The 2019 ASCVD risk score is  only valid for ages 21 to 66 I have reviewed the [place], Fam and Soc history. Patient Active Problem List   Diagnosis Date Noted   History of femoral artery thrombosis - 2018 08/21/2018    Priority: High    Managed by vascular. 2018    Anticoagulant long-term use 03/06/2018    Priority: High   Paroxysmal atrial fibrillation ([place]) 01/23/2018    Priority: High   Diastolic CHF ([place]) 97/98/9211    Priority: High    Echo 11/2016, nl EF but LVH and diastolic dysfunction     Type 2 diabetes mellitus with peripheral neuropathy ([place]) 01/24/2017    Priority: High   Subclinical hyperthyroidism 01/21/2014    Priority: High    Managed by endocrine    Essential hypertension 02/23/2013    Priority: High   Combined hyperlipidemia associated with type 2 diabetes mellitus ([place]) 02/23/2013    Priority: High   Major depression, chronic 07/18/2012    Priority: High   DJD of right shoulder 01/24/2017    Priority: Medium    Bereavement due to life event 03/05/2016    Priority: Medium     Her husband of 51 years died in Feb 06, 2016     Osteopenia 08/23/2015    Priority: Medium     DEXA 11/2020: lowest T =-2.3, left femoral neck, elevated FRAX score Major Osteoporotic Fracture: 29.4% Hip Fracture:                19.1% Recommend tx.  dexa 2017: T = -1.0 at femur, elevated FRAX score.  Repeat 2019 Osteopenia with elevated hip frac score of 10%:  T lowest = - 1.8 Major Osteoporotic Fracture: 17.1% Hip Fracture:                10.4% Population:                  [place] ([place]) Risk Factors:                Family Hist. (Parent hip fracture) Offer medications due to elevated hip fracture risk score.     Idiopathic scoliosis of thoracic spine 07/09/2014    Priority: Medium    Sjogren's syndrome ([place]) 08/04/2012    Priority: Medium    DDD (degenerative disc disease), lumbosacral 07/15/2012    Priority: Medium    Lumbosacral spondylosis 07/15/2012    Priority: Medium    Osteoarthritis of knee  07/15/2012    Priority: Medium    Bilateral edema of lower extremity 01/26/2017    Priority: Low   Bilateral hip bursitis 07/17/2015    Priority: Low   Age-related nuclear cataract of both eyes 03/11/2015    Priority: Low   Insomnia 09/04/2013    Priority: Low   Allergic rhinitis 07/15/2012    Priority: Low   Skin rash 02/27/2021   Skin pruritus 02/27/2021    Social History: Patient  reports that she has never smoked. She has never used smokeless tobacco. She reports that she  does not drink alcohol and does not use drugs.  Review of Systems: Ophthalmic: negative for eye pain, loss of vision or double vision Cardiovascular: negative for chest pain Respiratory: negative for SOB or persistent cough Gastrointestinal: negative for abdominal pain Genitourinary: negative for dysuria or gross hematuria MSK: negative for foot lesions Neurologic: negative for weakness or gait disturbance  Objective  Vitals: BP (!) 160/98    Pulse 80    Temp 98 F (36.7 C) (Temporal)    Ht 5' 1"  (1.549 m)    Wt 165 lb (74.8 kg)    SpO2 98%    BMI 31.18 kg/m  General: well appearing, no acute distress  Psych:  Alert and oriented, normal mood and affect HEENT:  Normocephalic, atraumatic, moist mucous membranes, supple neck  Cardiovascular:  Nl S1 and S2, RRR without murmur, gallop or rub. no edema Respiratory:  Good breath sounds bilaterally, CTAB with normal effort, no rales     Diabetic education: ongoing education regarding chronic disease management for diabetes was given today. We continue to reinforce the ABC's of diabetic management: A1c (<7 or 8 dependent upon patient), tight blood pressure control, and cholesterol management with goal LDL < 100 minimally. We discuss diet strategies, exercise recommendations, medication options and possible side effects. At each visit, we review recommended immunizations and preventive care recommendations for diabetics and stress that good diabetic control can  prevent other problems. See below for this patient's data.   Commons side effects, risks, benefits, and alternatives for medications and treatment plan prescribed today were discussed, and the patient expressed understanding of the given instructions. Patient is instructed to call or message via MyChart if he/she has any questions or concerns regarding our treatment plan. No barriers to understanding were identified. We discussed Red Flag symptoms and signs in detail. Patient expressed understanding regarding what to do in case of urgent or emergency type symptoms.  Medication list was reconciled, printed and provided to the patient in AVS. Patient instructions and summary information was reviewed with the patient as documented in the AVS. This note was prepared with assistance of Dragon voice recognition software. Occasional wrong-word or sound-a-like substitutions may have occurred due to the inherent limitations of voice recognition software  This visit occurred during the SARS-CoV-2 public health emergency.  Safety protocols were in place, including screening questions prior to the visit, additional usage of staff PPE, and extensive cleaning of exam room while observing appropriate contact time as indicated for disinfecting solutions.

## 2021-03-06 NOTE — Patient Instructions (Addendum)
Please return in 3 months for your annual complete physical; please come fasting. Can also schedule a f/u visit in 6 weeks for blood pressure recheck.   Please start the higher dose of lisinopril/hctz 20/25 to help manage your blood pressure.  Please start the metformin xr 500 once a day. Try zyrtec 10mg  otc nightly to see if it will help with your drainage and cough.  If you have any questions or concerns, please don't hesitate to send me a message via MyChart or call the office at [phone]. Thank you for visiting with [place] today! It's our pleasure caring for you.

## 2021-04-17 ENCOUNTER — Encounter: Payer: Self-pay | Admitting: Family Medicine

## 2021-04-17 ENCOUNTER — Ambulatory Visit (INDEPENDENT_AMBULATORY_CARE_PROVIDER_SITE_OTHER): Payer: Medicare Other | Admitting: Family Medicine

## 2021-04-17 VITALS — BP 152/80 | HR 66 | Temp 97.7°F | Ht 61.0 in | Wt 163.8 lb

## 2021-04-17 DIAGNOSIS — E1142 Type 2 diabetes mellitus with diabetic polyneuropathy: Secondary | ICD-10-CM | POA: Diagnosis not present

## 2021-04-17 DIAGNOSIS — E782 Mixed hyperlipidemia: Secondary | ICD-10-CM | POA: Diagnosis not present

## 2021-04-17 DIAGNOSIS — I10 Essential (primary) hypertension: Secondary | ICD-10-CM

## 2021-04-17 DIAGNOSIS — E1169 Type 2 diabetes mellitus with other specified complication: Secondary | ICD-10-CM | POA: Diagnosis not present

## 2021-04-17 DIAGNOSIS — F329 Major depressive disorder, single episode, unspecified: Secondary | ICD-10-CM

## 2021-04-17 DIAGNOSIS — I4821 Permanent atrial fibrillation: Secondary | ICD-10-CM | POA: Diagnosis not present

## 2021-04-17 DIAGNOSIS — Z7901 Long term (current) use of anticoagulants: Secondary | ICD-10-CM

## 2021-04-17 LAB — CBC WITH DIFFERENTIAL/PLATELET
Basophils Absolute: 0 10*3/uL (ref 0.0–0.1)
Basophils Relative: 0.8 % (ref 0.0–3.0)
Eosinophils Absolute: 0.1 10*3/uL (ref 0.0–0.7)
Eosinophils Relative: 1.8 % (ref 0.0–5.0)
HCT: 43.7 % (ref 36.0–46.0)
Hemoglobin: 14.5 g/dL (ref 12.0–15.0)
Lymphocytes Relative: 25.6 % (ref 12.0–46.0)
Lymphs Abs: 1.3 10*3/uL (ref 0.7–4.0)
MCHC: 33.2 g/dL (ref 30.0–36.0)
MCV: 90.7 fl (ref 78.0–100.0)
Monocytes Absolute: 0.4 10*3/uL (ref 0.1–1.0)
Monocytes Relative: 8.2 % (ref 3.0–12.0)
Neutro Abs: 3.1 10*3/uL (ref 1.4–7.7)
Neutrophils Relative %: 63.6 % (ref 43.0–77.0)
Platelets: 158 10*3/uL (ref 150.0–400.0)
RBC: 4.82 Mil/uL (ref 3.87–5.11)
RDW: 13.6 % (ref 11.5–15.5)
WBC: 4.9 10*3/uL (ref 4.0–10.5)

## 2021-04-17 LAB — COMPREHENSIVE METABOLIC PANEL
ALT: 16 U/L (ref 0–35)
AST: 24 U/L (ref 0–37)
Albumin: 4.1 g/dL (ref 3.5–5.2)
Alkaline Phosphatase: 75 U/L (ref 39–117)
BUN: 21 mg/dL (ref 6–23)
CO2: 33 mEq/L — ABNORMAL HIGH (ref 19–32)
Calcium: 10.2 mg/dL (ref 8.4–10.5)
Chloride: 100 mEq/L (ref 96–112)
Creatinine, Ser: 1.06 mg/dL (ref 0.40–1.20)
GFR: 48.3 mL/min — ABNORMAL LOW (ref >60.00)
Glucose, Bld: 171 mg/dL — ABNORMAL HIGH (ref 70–99)
Potassium: 5.1 mEq/L (ref 3.5–5.1)
Sodium: 143 mEq/L (ref 135–145)
Total Bilirubin: 1.1 mg/dL (ref 0.2–1.2)
Total Protein: 7.9 g/dL (ref 6.0–8.3)

## 2021-04-17 LAB — LIPID PANEL
Cholesterol: 177 mg/dL (ref 0–200)
HDL: 73.1 mg/dL (ref >39.00)
LDL Cholesterol: 80 mg/dL (ref 0–99)
NonHDL: 104.26
Total CHOL/HDL Ratio: 2
Triglycerides: 122 mg/dL (ref 0.0–149.0)
VLDL: 24.4 mg/dL (ref 0.0–40.0)

## 2021-04-17 LAB — TSH: TSH: 0.58 u[IU]/mL (ref 0.35–5.50)

## 2021-04-17 LAB — HEMOGLOBIN A1C: Hgb A1c MFr Bld: 7.5 % — ABNORMAL HIGH (ref 4.6–6.5)

## 2021-04-17 NOTE — Progress Notes (Addendum)
Subjective  CC:  Chief Complaint  Patient presents with   Hypertension    6 week f/u    HPI: Nicole Jimenez is a 87 y.o. female who presents to the office today to address the problems listed above in the chief complaint. Hypertension f/u: Patient is upset today, anxious.  Very worried about her son who still has not found a job.  She reports she more emotional.  Worry about near the end of her life.  Worried about getting rid of all her stuff etc.  She does not check her blood pressure at home.  We did decrease her Zestoretic at her last visit 6 weeks ago.  She reports that her cardiologist did want to adjust her medications but she was hesitant at that time.  She denies chest pain or palpitations. Type 2 diabetes: Started metformin low-dose last visit.  Tolerating well.  Last A1c was elevated at 7.4.  He has been 6 weeks on the medication.  No new symptoms. Hyperlipidemia due for recheck. Depression on Trintellix.  Emotional as noted above. Permanent A-fib on long-term anticoagulation without bleeding.  No symptoms.  Has follow-up with cardiology/vascular surgery next month.  Assessment  1. Essential hypertension   2. Permanent atrial fibrillation (HCC)   3. Type 2 diabetes mellitus with peripheral neuropathy (HCC)   4. Anticoagulant long-term use   5. Combined hyperlipidemia associated with type 2 diabetes mellitus (HCC)   6. Major depression, chronic      Plan   Hypertension f/u: BP control is poorly controlled.  It had been better controlled several months ago.  Blood pressure may be falsely elevated today due to her mood and anxiety.  She will follow-up with cardiology next month.  I recommend that she allow him to change her blood pressure medicine at that time if her blood pressure remains elevated.  She remains on atenolol and, Cardizem and Zestoretic.  We will check renal function electrolytes today. Hyperlipidemia f/u: Recheck lipids today. Continue  anticoagulation Continue Trintellix.  Counseling done.  I spent a total of 41 minutes for this patient encounter. Time spent included preparation, face-to-face counseling with the patient and coordination of care, review of chart and records, and documentation of the encounter.  Education regarding management of these chronic disease states was given. Management strategies discussed on successive visits include dietary and exercise recommendations, goals of achieving and maintaining IBW, and lifestyle modifications aiming for adequate sleep and minimizing stressors.   Follow up: 6 months for complete physical  Orders Placed This Encounter  Procedures   CBC with Differential/Platelet   Comprehensive metabolic panel   Lipid panel   TSH   Hemoglobin A1c   No orders of the defined types were placed in this encounter.     BP Readings from Last 3 Encounters:  04/17/21 (!) 152/80  03/06/21 (!) 160/98  02/27/21 (!) 184/104   Wt Readings from Last 3 Encounters:  04/17/21 163 lb 12.8 oz (74.3 kg)  03/06/21 165 lb (74.8 kg)  02/27/21 165 lb 6.4 oz (75 kg)    Lab Results  Component Value Date   CHOL 175 05/19/2020   CHOL 163 02/27/2019   CHOL 156 02/24/2018   Lab Results  Component Value Date   HDL 75.10 05/19/2020   HDL 68.70 02/27/2019   HDL 59.60 02/24/2018   Lab Results  Component Value Date   LDLCALC 76 05/19/2020   LDLCALC 67 02/27/2019   LDLCALC 60 02/24/2018   Lab Results  Component  Value Date   TRIG 116.0 05/19/2020   TRIG 135.0 02/27/2019   TRIG 181.0 (H) 02/24/2018   Lab Results  Component Value Date   CHOLHDL 2 05/19/2020   CHOLHDL 2 02/27/2019   CHOLHDL 3 02/24/2018   No results found for: LDLDIRECT Lab Results  Component Value Date   CREATININE 0.98 05/19/2020   BUN 22 05/19/2020   NA 141 05/19/2020   K 4.5 05/19/2020   CL 100 05/19/2020   CO2 31 05/19/2020   Lab Results  Component Value Date   HGBA1C 7.4 (A) 03/06/2021   HGBA1C 7.0 (A)  11/28/2020   HGBA1C 6.4 (A) 05/19/2020      The ASCVD Risk score (Arnett DK, et al., 2019) failed to calculate for the following reasons:   The 2019 ASCVD risk score is only valid for ages 24 to 10  I reviewed the patients updated PMH, FH, and SocHx.    Patient Active Problem List   Diagnosis Date Noted   History of femoral artery thrombosis - 2018 08/21/2018    Priority: High   Anticoagulant long-term use 03/06/2018    Priority: High   Permanent atrial fibrillation (HCC) 01/23/2018    Priority: High   Diastolic CHF (HCC) 03/03/2017    Priority: High   Type 2 diabetes mellitus with peripheral neuropathy (HCC) 01/24/2017    Priority: High   Subclinical hyperthyroidism 01/21/2014    Priority: High   Essential hypertension 02/23/2013    Priority: High   Combined hyperlipidemia associated with type 2 diabetes mellitus (HCC) 02/23/2013    Priority: High   Major depression, chronic 07/18/2012    Priority: High   DJD of right shoulder 01/24/2017    Priority: Medium    Bereavement due to life event 03/05/2016    Priority: Medium    Osteopenia 08/23/2015    Priority: Medium    Idiopathic scoliosis of thoracic spine 07/09/2014    Priority: Medium    Sjogren's syndrome (HCC) 08/04/2012    Priority: Medium    DDD (degenerative disc disease), lumbosacral 07/15/2012    Priority: Medium    Lumbosacral spondylosis 07/15/2012    Priority: Medium    Osteoarthritis of knee 07/15/2012    Priority: Medium    Bilateral edema of lower extremity 01/26/2017    Priority: Low   Bilateral hip bursitis 07/17/2015    Priority: Low   Age-related nuclear cataract of both eyes 03/11/2015    Priority: Low   Insomnia 09/04/2013    Priority: Low   Allergic rhinitis 07/15/2012    Priority: Low   Skin rash 02/27/2021   Skin pruritus 02/27/2021    Allergies: Amoxicillin-pot clavulanate, Amoxicillin, and Clarithromycin  Social History: Patient  reports that she has never smoked. She has never  used smokeless tobacco. She reports that she does not drink alcohol and does not use drugs.  Current Meds  Medication Sig   Acetaminophen 500 MG coapsule Take 1,000 mg by mouth as needed.    alendronate (FOSAMAX) 70 MG tablet Take 1 tablet (70 mg total) by mouth once a week. Take with a full glass of water on an empty stomach.   apixaban (ELIQUIS) 2.5 MG TABS tablet Take 1 tablet (2.5 mg total) by mouth 2 (two) times daily.   atenolol (TENORMIN) 50 MG tablet TAKE 1 TABLET BY MOUTH EVERY DAY   atorvastatin (LIPITOR) 40 MG tablet TAKE 1 TABLET BY MOUTH EVERYDAY AT BEDTIME   Calcium Carb-Cholecalciferol 600-800 MG-UNIT CHEW Chew 1 each by mouth daily.  diclofenac sodium (VOLTAREN) 1 % GEL APPLY 2 G TOPICALLY 4 (FOUR) TIMES DAILY AS NEEDED. **PA DENIED**   diltiazem (CARDIZEM CD) 180 MG 24 hr capsule TAKE 1 CAPSULE (180 MG TOTAL) BY MOUTH EVERY MORNING.   Glucosamine-MSM-Hyaluronic Acd (JOINT HEALTH PO) Take 1 tablet by mouth daily.   glucose blood (ONETOUCH ULTRA) test strip USE TWICE DAILY TO CHECK BLOOD SUGAR - E11.40   Krill Oil 500 MG CAPS Take 500 mg by mouth daily.   lisinopril-hydrochlorothiazide (ZESTORETIC) 20-25 MG tablet Take 1 tablet by mouth daily.   metFORMIN (GLUCOPHAGE XR) 500 MG 24 hr tablet Take 1 tablet (500 mg total) by mouth daily with breakfast.   Multiple Vitamins-Minerals (MULTIVITAMIN ADULT PO) Take by mouth.   triamcinolone cream (KENALOG) 0.1 % Apply 1 application topically 2 (two) times daily. to rash areas.   TRINTELLIX 10 MG TABS tablet TAKE 1 TABLET BY MOUTH EVERY DAY    Review of Systems: Cardiovascular: negative for chest pain, palpitations, leg swelling, orthopnea Respiratory: negative for SOB, wheezing or persistent cough Gastrointestinal: negative for abdominal pain Genitourinary: negative for dysuria or gross hematuria  Objective  Vitals: BP (!) 152/80   Pulse 66   Temp 97.7 F (36.5 C) (Temporal)   Ht 5\' 1"  (1.549 m)   Wt 163 lb 12.8 oz (74.3 kg)    SpO2 96%   BMI 30.95 kg/m  General: no acute distress  Psych:  Alert and oriented, normal mood and affect but tearful HEENT:  Normocephalic, atraumatic, supple neck  Cardiovascular: Irregularly irregular Respiratory:  Good breath sounds bilaterally, CTAB with normal respiratory effort Skin:  Warm, no rashes Neurologic:   Mental status is normal Commons side effects, risks, benefits, and alternatives for medications and treatment plan prescribed today were discussed, and the patient expressed understanding of the given instructions. Patient is instructed to call or message via MyChart if he/she has any questions or concerns regarding our treatment plan. No barriers to understanding were identified. We discussed Red Flag symptoms and signs in detail. Patient expressed understanding regarding what to do in case of urgent or emergency type symptoms.  Medication list was reconciled, printed and provided to the patient in AVS. Patient instructions and summary information was reviewed with the patient as documented in the AVS. This note was prepared with assistance of Dragon voice recognition software. Occasional wrong-word or sound-a-like substitutions may have occurred due to the inherent limitations of voice recognition software  This visit occurred during the SARS-CoV-2 public health emergency.  Safety protocols were in place, including screening questions prior to the visit, additional usage of staff PPE, and extensive cleaning of exam room while observing appropriate contact time as indicated for disinfecting solutions.

## 2021-04-17 NOTE — Patient Instructions (Signed)
Please return in 6 months for your annual complete physical; please come fasting.   I will release your lab results to you on your MyChart account with further instructions. You may see the results before I do, but when I review them I will send you a message with my report or have my assistant call you if things need to be discussed. Please reply to my message with any questions. Thank you!   If you have any questions or concerns, please don't hesitate to send me a message via MyChart or call the office at [phone]. Thank you for visiting with us today! It's our pleasure caring for you.  

## 2021-04-17 NOTE — Addendum Note (Signed)
Addended by: Billey Chang on: 04/17/2021 12:23 PM ? ? Modules accepted: Level of Service ? ?

## 2021-04-26 ENCOUNTER — Other Ambulatory Visit: Payer: Self-pay | Admitting: Family Medicine

## 2021-05-07 ENCOUNTER — Encounter: Payer: Self-pay | Admitting: Family Medicine

## 2021-05-07 ENCOUNTER — Other Ambulatory Visit: Payer: Self-pay

## 2021-05-07 ENCOUNTER — Other Ambulatory Visit: Payer: Self-pay | Admitting: Family Medicine

## 2021-05-07 DIAGNOSIS — I4821 Permanent atrial fibrillation: Secondary | ICD-10-CM

## 2021-05-21 ENCOUNTER — Other Ambulatory Visit: Payer: Self-pay

## 2021-05-21 MED ORDER — ATENOLOL 100 MG PO TABS: 100.0000 mg | ORAL_TABLET | Freq: Every day | ORAL | 3 refills | Status: DC

## 2021-05-22 ENCOUNTER — Encounter: Payer: Self-pay | Admitting: Family Medicine

## 2021-05-24 ENCOUNTER — Other Ambulatory Visit: Payer: Self-pay | Admitting: Family Medicine

## 2021-06-05 ENCOUNTER — Ambulatory Visit (INDEPENDENT_AMBULATORY_CARE_PROVIDER_SITE_OTHER): Payer: Medicare Other

## 2021-06-05 VITALS — BP 138/80 | HR 52 | Temp 97.9°F | Wt 163.6 lb

## 2021-06-05 DIAGNOSIS — Z0000 Encounter for general adult medical examination without abnormal findings: Secondary | ICD-10-CM

## 2021-06-05 NOTE — Patient Instructions (Addendum)
Ms. Bluett , ?Thank you for taking time to come for your Medicare Wellness Visit. I appreciate your ongoing commitment to your health goals. Please review the following plan we discussed and let me know if I can assist you in the future.  ? ?Screening recommendations/referrals: ?Colonoscopy: No longer required  ?Mammogram: No longer required  ?Bone Density: Done 11/13/20 repeat every 2 years  ?Recommended yearly ophthalmology/optometry visit for glaucoma screening and checkup ?Recommended yearly dental visit for hygiene and checkup ? ?Vaccinations: ?Influenza vaccine: Done 11/28/20 repeat every year  ?Pneumococcal vaccine: Up to date ?Tdap vaccine: not a candidate  ?Shingles vaccine: Completed 1/30 & 08/21/18   ?Covid-19:Completed 1/21, 2/11, 11/16/19 & 4//12, 12/16/20 ? ?Advanced directives: Please bring a copy of your health care power of attorney and living will to the office at your convenience. ? ?Conditions/risks identified: lose weight  ? ?Next appointment: Follow up in one year for your annual wellness visit  ? ? ?Preventive Care 75 Years and Older, Female ?Preventive care refers to lifestyle choices and visits with your health care provider that can promote health and wellness. ?What does preventive care include? ?A yearly physical exam. This is also called an annual well check. ?Dental exams once or twice a year. ?Routine eye exams. Ask your health care provider how often you should have your eyes checked. ?Personal lifestyle choices, including: ?Daily care of your teeth and gums. ?Regular physical activity. ?Eating a healthy diet. ?Avoiding tobacco and drug use. ?Limiting alcohol use. ?Practicing safe sex. ?Taking low-dose aspirin every day. ?Taking vitamin and mineral supplements as recommended by your health care provider. ?What happens during an annual well check? ?The services and screenings done by your health care provider during your annual well check will depend on your age, overall health, lifestyle  risk factors, and family history of disease. ?Counseling  ?Your health care provider may ask you questions about your: ?Alcohol use. ?Tobacco use. ?Drug use. ?Emotional well-being. ?Home and relationship well-being. ?Sexual activity. ?Eating habits. ?History of falls. ?Memory and ability to understand (cognition). ?Work and work Statistician. ?Reproductive health. ?Screening  ?You may have the following tests or measurements: ?Height, weight, and BMI. ?Blood pressure. ?Lipid and cholesterol levels. These may be checked every 5 years, or more frequently if you are over 54 years old. ?Skin check. ?Lung cancer screening. You may have this screening every year starting at age 69 if you have a 30-pack-year history of smoking and currently smoke or have quit within the past 15 years. ?Fecal occult blood test (FOBT) of the stool. You may have this test every year starting at age 79. ?Flexible sigmoidoscopy or colonoscopy. You may have a sigmoidoscopy every 5 years or a colonoscopy every 10 years starting at age 43. ?Hepatitis C blood test. ?Hepatitis B blood test. ?Sexually transmitted disease (STD) testing. ?Diabetes screening. This is done by checking your blood sugar (glucose) after you have not eaten for a while (fasting). You may have this done every 1-3 years. ?Bone density scan. This is done to screen for osteoporosis. You may have this done starting at age 31. ?Mammogram. This may be done every 1-2 years. Talk to your health care provider about how often you should have regular mammograms. ?Talk with your health care provider about your test results, treatment options, and if necessary, the need for more tests. ?Vaccines  ?Your health care provider may recommend certain vaccines, such as: ?Influenza vaccine. This is recommended every year. ?Tetanus, diphtheria, and acellular pertussis (Tdap, Td) vaccine. You  may need a Td booster every 10 years. ?Zoster vaccine. You may need this after age 36. ?Pneumococcal  13-valent conjugate (PCV13) vaccine. One dose is recommended after age 47. ?Pneumococcal polysaccharide (PPSV23) vaccine. One dose is recommended after age 56. ?Talk to your health care provider about which screenings and vaccines you need and how often you need them. ?This information is not intended to replace advice given to you by your health care provider. Make sure you discuss any questions you have with your health care provider. ?Document Released: 02/21/2015 Document Revised: 10/15/2015 Document Reviewed: 11/26/2014 ?Elsevier Interactive Patient Education ? 2017 [employer]. ? ?Fall Prevention in the Home ?Falls can cause injuries. They can happen to people of all ages. There are many things you can do to make your home safe and to help prevent falls. ?What can I do on the outside of my home? ?Regularly fix the edges of walkways and driveways and fix any cracks. ?Remove anything that might make you trip as you walk through a door, such as a raised step or threshold. ?Trim any bushes or trees on the path to your home. ?Use bright outdoor lighting. ?Clear any walking paths of anything that might make someone trip, such as rocks or tools. ?Regularly check to see if handrails are loose or broken. Make sure that both sides of any steps have handrails. ?Any raised decks and porches should have guardrails on the edges. ?Have any leaves, snow, or ice cleared regularly. ?Use sand or salt on walking paths during winter. ?Clean up any spills in your garage right away. This includes oil or grease spills. ?What can I do in the bathroom? ?Use night lights. ?Install grab bars by the toilet and in the tub and shower. Do not use towel bars as grab bars. ?Use non-skid mats or decals in the tub or shower. ?If you need to sit down in the shower, use a plastic, non-slip stool. ?Keep the floor dry. Clean up any water that spills on the floor as soon as it happens. ?Remove soap buildup in the tub or shower regularly. ?Attach  bath mats securely with double-sided non-slip rug tape. ?Do not have throw rugs and other things on the floor that can make you trip. ?What can I do in the bedroom? ?Use night lights. ?Make sure that you have a light by your bed that is easy to reach. ?Do not use any sheets or blankets that are too big for your bed. They should not hang down onto the floor. ?Have a firm chair that has side arms. You can use this for support while you get dressed. ?Do not have throw rugs and other things on the floor that can make you trip. ?What can I do in the kitchen? ?Clean up any spills right away. ?Avoid walking on wet floors. ?Keep items that you use a lot in easy-to-reach places. ?If you need to reach something above you, use a strong step stool that has a grab bar. ?Keep electrical cords out of the way. ?Do not use floor polish or wax that makes floors slippery. If you must use wax, use non-skid floor wax. ?Do not have throw rugs and other things on the floor that can make you trip. ?What can I do with my stairs? ?Do not leave any items on the stairs. ?Make sure that there are handrails on both sides of the stairs and use them. Fix handrails that are broken or loose. Make sure that handrails are as long as the  stairways. ?Check any carpeting to make sure that it is firmly attached to the stairs. Fix any carpet that is loose or worn. ?Avoid having throw rugs at the top or bottom of the stairs. If you do have throw rugs, attach them to the floor with carpet tape. ?Make sure that you have a light switch at the top of the stairs and the bottom of the stairs. If you do not have them, ask someone to add them for you. ?What else can I do to help prevent falls? ?Wear shoes that: ?Do not have high heels. ?Have rubber bottoms. ?Are comfortable and fit you well. ?Are closed at the toe. Do not wear sandals. ?If you use a stepladder: ?Make sure that it is fully opened. Do not climb a closed stepladder. ?Make sure that both sides of the  stepladder are locked into place. ?Ask someone to hold it for you, if possible. ?Clearly mark and make sure that you can see: ?Any grab bars or handrails. ?First and last steps. ?Where the edge of each step is.

## 2021-06-05 NOTE — Progress Notes (Addendum)
? ?Subjective:  ? Nicole Jimenez is a 87 y.o. female who presents for Medicare Annual (Subsequent) preventive examination. ? ?Review of Systems    ? ?Cardiac Risk Factors include: advanced age (>72mn, >>20women);dyslipidemia;diabetes mellitus;hypertension;obesity (BMI >30kg/m2) ? ?   ?Objective:  ?  ?Today's Vitals  ? 06/05/21 1058  ?BP: 138/80  ?Pulse: (!) 52  ?Temp: 97.9 ?F (36.6 ?C)  ?SpO2: 92%  ?Weight: 163 lb 9.6 oz (74.2 kg)  ? ?Body mass index is 30.91 kg/m?. ? ? ?  06/05/2021  ? 11:21 AM 06/02/2020  ? 11:17 AM 11/20/2018  ? 11:55 AM 07/08/2017  ?  2:33 PM  ?Advanced Directives  ?Does Patient Have a Medical Advance Directive? Yes Yes Yes Yes  ?Type of Advance Directive Living will [place] will Living will;Healthcare Power of [place] will  ?Does patient want to make changes to medical advance directive?   No - Patient declined   ?Copy of [place]?  No - copy requested No - copy requested No - copy requested  ? ? ?Current Medications (verified) ?Outpatient Encounter Medications as of 06/05/2021  ?Medication Sig  ? Acetaminophen 500 MG coapsule Take 1,000 mg by mouth as needed.   ? atenolol (TENORMIN) 100 MG tablet Take 1 tablet (100 mg total) by mouth daily.  ? Calcium Carb-Cholecalciferol 600-800 MG-UNIT CHEW Chew 1 each by mouth daily.   ? diclofenac sodium (VOLTAREN) 1 % GEL APPLY 2 G TOPICALLY 4 (FOUR) TIMES DAILY AS NEEDED. **PA DENIED**  ? diltiazem (CARDIZEM CD) 180 MG 24 hr capsule TAKE 1 CAPSULE (180 MG TOTAL) BY MOUTH EVERY MORNING.  ? ELIQUIS 2.5 MG TABS tablet TAKE 1 TABLET BY MOUTH TWICE A DAY  ? Glucosamine-MSM-Hyaluronic Acd (JOINT HEALTH PO) Take 1 tablet by mouth daily.  ? glucose blood (ONETOUCH ULTRA) test strip USE TWICE DAILY TO CHECK BLOOD SUGAR - E11.40  ? Krill Oil 500 MG CAPS Take 500 mg by mouth daily.  ? lisinopril-hydrochlorothiazide (ZESTORETIC) 20-25 MG tablet Take 1 tablet by mouth  daily.  ? metFORMIN (GLUCOPHAGE XR) 500 MG 24 hr tablet Take 1 tablet (500 mg total) by mouth daily with breakfast. (Patient taking differently: Take 1,000 mg by mouth daily with breakfast.)  ? Multiple Vitamins-Minerals (MULTIVITAMIN ADULT PO) Take by mouth.  ? rosuvastatin (CRESTOR) 40 MG tablet Take 40 mg by mouth daily.  ? TRINTELLIX 10 MG TABS tablet TAKE 1 TABLET BY MOUTH EVERY DAY  ? alendronate (FOSAMAX) 70 MG tablet Take 1 tablet (70 mg total) by mouth once a week. Take with a full glass of water on an empty stomach. (Patient not taking: Reported on 06/05/2021)  ? [DISCONTINUED] atorvastatin (LIPITOR) 40 MG tablet TAKE 1 TABLET BY MOUTH EVERYDAY AT BEDTIME  ? [DISCONTINUED] triamcinolone cream (KENALOG) 0.1 % Apply 1 application topically 2 (two) times daily. to rash areas.  ? ?No facility-administered encounter medications on file as of 06/05/2021.  ? ? ?Allergies (verified) ?Amoxicillin-pot clavulanate, Amoxicillin, and Clarithromycin  ? ?History: ?Past Medical History:  ?Diagnosis Date  ? Arthritis   ? Depression   ? Diabetes mellitus without complication ([place]   ? Borderline  ? Diastolic CHF ([place] 19/38/1829 ? Echo 11/2016, nl EF but LVH and diastolic dysfunction  ? GERD (gastroesophageal reflux disease)   ? Hyperlipidemia   ? Hypertension   ? Peripheral arterial disease ([facility]  ? thromboembolism of femoral artery  ? Thyroid disease   ? subclinical  hyperthyroidism managed by endocrinology  ? ?Past Surgical History:  ?Procedure Laterality Date  ? FEMORAL ENDARTERECTOMY Right 11/29/2016  ? ?Family History  ?Problem Relation Age of Onset  ? Heart attack Mother   ? Arthritis Mother   ? Diabetes Father   ? Heart attack Father   ? Hyperlipidemia Father   ? Arthritis Sister   ? Diabetes Sister   ?     adopted  ? Healthy Daughter   ? Healthy Son   ? Healthy Son   ? Other Son   ?     cyst on thyroid  ? ?Social History  ? ?Socioeconomic History  ? Marital status: Widowed  ?  Spouse name: Not on file  ? Number  of children: Not on file  ? Years of education: Not on file  ? Highest education level: Not on file  ?Occupational History  ? Occupation: retired Scientist, physiological  ?Tobacco Use  ? Smoking status: Never  ? Smokeless tobacco: Never  ?Vaping Use  ? Vaping Use: Never used  ?Substance and Sexual Activity  ? Alcohol use: No  ? Drug use: No  ? Sexual activity: Never  ?Other Topics Concern  ? Not on file  ?Social History Narrative  ? Not on file  ? ?Social Determinants of Health  ? ?Financial Resource Strain: Low Risk   ? Difficulty of Paying Living Expenses: Not hard at all  ?Food Insecurity: No Food Insecurity  ? Worried About Charity fundraiser in the Last Year: Never true  ? Ran Out of Food in the Last Year: Never true  ?Transportation Needs: No Transportation Needs  ? Lack of Transportation (Medical): No  ? Lack of Transportation (Non-Medical): No  ?Physical Activity: Insufficiently Active  ? Days of Exercise per Week: 1 day  ? Minutes of Exercise per Session: 20 min  ?Stress: Stress Concern Present  ? Feeling of Stress : To some extent  ?Social Connections: Moderately Integrated  ? Frequency of Communication with Friends and Family: Once a week  ? Frequency of Social Gatherings with Friends and Family: Twice a week  ? Attends Religious Services: More than 4 times per year  ? Active Member of Clubs or Organizations: Yes  ? Attends Archivist Meetings: 1 to 4 times per year  ? Marital Status: Widowed  ? ? ?Tobacco Counseling ?Counseling given: Not Answered ? ? ?Clinical Intake: ? ?Pre-visit preparation completed: Yes ? ?Pain : No/denies pain ? ?  ? ?BMI - recorded: 30.91 ?Nutritional Status: BMI > 30  Obese ?Nutritional Risks: None ?Diabetes: Yes ?CBG done?: No ?Did pt. bring in CBG monitor from home?: No ? ?How often do you need to have someone help you when you read instructions, pamphlets, or other written materials from your doctor or pharmacy?: 1 - Never ? ?Diabetic?Nutrition Risk Assessment: ? ?Has the  patient had any N/V/D within the last 2 months?  No  ?Does the patient have any non-healing wounds?  No  ?Has the patient had any unintentional weight loss or weight gain?  No  ? ?Diabetes: ? ?Is the patient diabetic?  Yes  ?If diabetic, was a CBG obtained today?  No  ?Did the patient bring in their glucometer from home?  No  ?How often do you monitor your CBG's? A couple times a week.  ? ?Financial Strains and Diabetes Management: ? ?Are you having any financial strains with the device, your supplies or your medication? No .  ?Does the patient want to be  seen by Chronic Care Management for management of their diabetes?  No  ?Would the patient like to be referred to a Nutritionist or for Diabetic Management?  Yes  ? ?Diabetic Exams: ? ?Diabetic Eye Exam: Completed 02/26/21 ?Diabetic Foot Exam: Completed 11/28/20 ? ? ?Interpreter Needed?: No ? ?Information entered by :: Charlott Rakes, LPN ? ? ?Activities of Daily Living ? ?  06/05/2021  ? 11:25 AM  ?In your present state of health, do you have any difficulty performing the following activities:  ?Hearing? 1  ?Comment slight HOH  ?Vision? 0  ?Difficulty concentrating or making decisions? 0  ?Walking or climbing stairs? 0  ?Dressing or bathing? 0  ?Doing errands, shopping? 0  ?Preparing Food and eating ? N  ?Using the Toilet? N  ?In the past six months, have you accidently leaked urine? N  ?Do you have problems with loss of bowel control? N  ?Managing your Medications? N  ?Managing your Finances? N  ?Housekeeping or managing your Housekeeping? N  ? ? ?Patient Care Team: ?Leamon Arnt, MD as PCP - General (Family Medicine) ?Roel Cluck, MD as Referring Physician (Ophthalmology) ?Alanson Aly, MD as Consulting Physician (Endocrinology) ?Dinah Beers, MD as Referring Physician (Vascular Surgery) ?Gaynelle Arabian, MD as Consulting Physician (Orthopedic Surgery) ?Dermatology, [facility] as Consulting Physician ? ?Indicate any recent Medical Services you may  have received from other than [facility] providers in the past year (date may be approximate). ? ?   ?Assessment:  ? This is a routine wellness examination for [place]. ? ?Hearing/Vision screen ?Hearing Screening - Comme

## 2021-06-08 ENCOUNTER — Other Ambulatory Visit: Payer: Self-pay

## 2021-06-08 DIAGNOSIS — E1142 Type 2 diabetes mellitus with diabetic polyneuropathy: Secondary | ICD-10-CM

## 2021-06-08 MED ORDER — ONETOUCH ULTRA VI STRP: ORAL_STRIP | 12 refills | Status: DC

## 2021-06-11 ENCOUNTER — Ambulatory Visit (INDEPENDENT_AMBULATORY_CARE_PROVIDER_SITE_OTHER): Payer: Medicare Other | Admitting: Cardiology

## 2021-06-11 ENCOUNTER — Encounter (HOSPITAL_BASED_OUTPATIENT_CLINIC_OR_DEPARTMENT_OTHER): Payer: Self-pay | Admitting: Cardiology

## 2021-06-11 VITALS — BP 130/80 | HR 60 | Ht 61.0 in | Wt 163.5 lb

## 2021-06-11 DIAGNOSIS — E1169 Type 2 diabetes mellitus with other specified complication: Secondary | ICD-10-CM | POA: Diagnosis not present

## 2021-06-11 DIAGNOSIS — D6869 Other thrombophilia: Secondary | ICD-10-CM

## 2021-06-11 DIAGNOSIS — I4821 Permanent atrial fibrillation: Secondary | ICD-10-CM

## 2021-06-11 DIAGNOSIS — E782 Mixed hyperlipidemia: Secondary | ICD-10-CM | POA: Diagnosis not present

## 2021-06-11 DIAGNOSIS — I10 Essential (primary) hypertension: Secondary | ICD-10-CM | POA: Diagnosis not present

## 2021-06-11 DIAGNOSIS — Z86718 Personal history of other venous thrombosis and embolism: Secondary | ICD-10-CM | POA: Diagnosis not present

## 2021-06-11 DIAGNOSIS — Z9889 Other specified postprocedural states: Secondary | ICD-10-CM | POA: Insufficient documentation

## 2021-06-11 DIAGNOSIS — Z7901 Long term (current) use of anticoagulants: Secondary | ICD-10-CM

## 2021-06-11 MED ORDER — APIXABAN 5 MG PO TABS: 5.0000 mg | ORAL_TABLET | Freq: Two times a day (BID) | ORAL | 3 refills | Status: DC

## 2021-06-11 NOTE — Progress Notes (Signed)
?Cardiology Office Note:   ? ?Date:  06/11/2021  ? ?ID:  Nicole Jimenez, DOB [birth year], MRN [id number] ? ?PCP:  Leamon Arnt, MD  ?Cardiologist:  Buford Dresser, MD ? ?Referring MD: Leamon Arnt, MD  ? ?CC: new patient evaluation for atrial fibrillation ? ?History of Present Illness:   ? ?Nicole Jimenez is a 87 y.o. 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 ? ?Previously followed in cardiology by Dr. Barbaraann Boys. Their last visit was 12/30/2020, where she was doing well with no palpitations or syncope. On Eliquis for stroke prophylaxis. At that visit her EKG showed she reverted to Afib (cardioversion 03/2018, she didn't feel any differently before or after). Her BP was 199/78; it was recommended to increase her antihypertensives but she preferred to stop them and control her BP naturally. Her medications were not changed and she would work on weight loss and exercise. ? ?On 04/21/2021 she called Dr. Ferdinand Lango and reported her BP had been elevated for a while (systolic [id number]). At that time she was on diltiazem 180 mg, Lisinopril/HCTZ 20-25 mg, and atenolol 50 mg. ? ?Cardiovascular risk factors: ?Prior clinical ASCVD:  [facility] Cardioversion 03/21/2018, she confirms that she was unable to tell if this procedure was successful. She never felt symptoms such as palpitations. She reports having a blood clot previously while she was in [place], and was subsequently prescribed 2.5 mg Eliquis twice daily. ?Comorbid conditions: Hypertension, Hyperlipidemia, Diabetes  ?Prior cardiac testing and/or incidental findings on other testing (ie coronary calcium): She notes that the majority of her  providers were at [facility] previously. TTE 02/2018 with normal LV function and mild MR/TR. ?Exercise level: Lately she has not been formally exercising. She is mostly sedentary, often sitting at the computer. Her driveway has a very slight incline. She has noticed that when she is walking to the mailbox she becomes a little short winded. She then walks slowly back to the house. She would attribute this to deconditioning. Recently she started doing chair yoga/exercises. She did this 2-3 times last week, and wants to work up to every day. She has tried walking for 15 minutes around the house between rooms when she thinks about it. She endorses arthritis in her lower back, which causes her to develop an ache in her back after minimal walking. This also contributes to her limited exercise. For an hour or so she will lie down and rest in the evening for her back. ?Current diet: She "loves eating." Her wellness nurse has recommended she see a nutritionist; she is waiting to be scheduled. Additionally, she complains of edema in her feet which has been "off and on" in the past, which she attributes to enjoying salty foods and being sedentary. This has worsened lately. In the past week or so her edema seems to improve, but then it returns as quickly as by the time she is finished with a morning shower. She has tried wearing compression socks up to the ankle while sleeping at night, which helps improve her edema. Often drinks tea and coffee. ? ?Additionally, she complains of a "phlegm-like" sensation in her throat, like she wants to cough all the time. She denies any sputum production. ? ?She denies any palpitations,  chest pain, lightheadedness, headaches, syncope, orthopnea, or PND. ? ?Past Medical History:  ?Diagnosis Date  ? Arthritis   ? Depression   ? Diabetes mellitus without complication ([place])   ? Borderline  ? Diastolic CHF ([place]) 5/68/1275  ? Echo 11/2016, nl EF but LVH and diastolic dysfunction  ? GERD  (gastroesophageal reflux disease)   ? Hyperlipidemia   ? Hypertension   ? Peripheral arterial disease ([place]) 2018  ? thromboembolism of femoral artery  ? Thyroid disease   ? subclinical hyperthyroidism managed by endocrinology  ? ? ?Past Surgical History:  ?Procedure Laterality Date  ? FEMORAL ENDARTERECTOMY Right 11/29/2016  ? ? ?Current Medications: ?Current Outpatient Medications on File Prior to Visit  ?Medication Sig  ? Acetaminophen 500 MG coapsule Take 1,000 mg by mouth as needed.   ? atenolol (TENORMIN) 100 MG tablet Take 1 tablet (100 mg total) by mouth daily.  ? Calcium Carb-Cholecalciferol 600-800 MG-UNIT CHEW Chew 1 each by mouth daily.   ? diclofenac sodium (VOLTAREN) 1 % GEL APPLY 2 G TOPICALLY 4 (FOUR) TIMES DAILY AS NEEDED. **PA DENIED**  ? diltiazem (CARDIZEM CD) 180 MG 24 hr capsule TAKE 1 CAPSULE (180 MG TOTAL) BY MOUTH EVERY MORNING.  ? Glucosamine-MSM-Hyaluronic Acd (JOINT HEALTH PO) Take 1 tablet by mouth daily.  ? glucose blood (ONETOUCH ULTRA) test strip USE TWICE DAILY TO CHECK BLOOD SUGAR - E11.40  ? glucose blood (ONETOUCH ULTRA) test strip Use as instructed  ? Krill Oil 500 MG CAPS Take 500 mg by mouth daily.  ? lisinopril-hydrochlorothiazide (ZESTORETIC) 20-25 MG tablet Take 1 tablet by mouth daily.  ? metFORMIN (GLUCOPHAGE-XR) 500 MG 24 hr tablet Take 1,000 mg by mouth daily with breakfast.  ? Multiple Vitamins-Minerals (MULTIVITAMIN ADULT PO) Take by mouth.  ? rosuvastatin (CRESTOR) 40 MG tablet Take 40 mg by mouth daily.  ? TRINTELLIX 10 MG TABS tablet TAKE 1 TABLET BY MOUTH EVERY DAY  ? alendronate (FOSAMAX) 70 MG tablet Take 1 tablet (70 mg total) by mouth once a week. Take with a full glass of water on an empty stomach. (Patient not taking: Reported on 06/05/2021)  ? ?No current facility-administered medications on file prior to visit.  ?  ? ?Allergies:   Amoxicillin-pot clavulanate, Amoxicillin, and Clarithromycin  ? ?Social History  ? ?Tobacco Use  ? Smoking status: Never  ?  Smokeless tobacco: Never  ?Vaping Use  ? Vaping Use: Never used  ?Substance Use Topics  ? Alcohol use: No  ? Drug use: No  ? ? ?Family History: ?family history includes Arthritis in her mother and sister; Diabetes in her father and sister; Healthy in her daughter, son, and son; Heart attack in her father and mother; Hyperlipidemia in her father; Other in her son. ? ?ROS:   ?Please see the history of present illness.  Additional pertinent ROS: ?Constitutional: Negative for chills, fever, night sweats, unintentional weight loss  ?HENT: Negative for ear pain and hearing loss.   ?Eyes: Negative for loss of vision and eye pain.  ?Respiratory: Negative for cough, sputum, wheezing. Positive for shortness of breath.   ?Cardiovascular: See HPI. ?Gastrointestinal: Negative for abdominal pain, melena, and hematochezia.  ?Genitourinary: Negative for dysuria and hematuria.  ?Musculoskeletal: Negative for falls. Positive for back pain. ?Skin: Negative for itching and rash.  ?Neurological: Negative for focal weakness, focal sensory changes and loss of consciousness.  ?Endo/Heme/Allergies: Does not bruise/bleed easily.   ? ? ?EKGs/Labs/Other Studies Reviewed:   ? ?The following studies were reviewed today: ? ?Cardioversion  03/21/2018 ([place]): ?IMPRESSION: Successful direct current cardioversion with restoration of  ?sinus rhythm from atrial fibrillation with no immediate complication.  ? ?TTE 02/17/2018: ?Summary  ?Normal left ventricular size and systolic function with no appreciable  ?segmental abnormality.  ?Ejection fraction is visually estimated at 60-65%.  ?Mild left ventricular hypertrophy.  ?Mild tricuspid regurgitation.  ?Mild mitral regurgitation.  ?Normal size left atrium.  ? ?ABI, LE Arterial and Venous Dopplers of 2019. ? ?CTA Abdominal Aorta and Bilateral Iliofemoral Runoff ([facility]): ?FINDINGS:  ? ?VASCULAR:  ?- Focal occlusion of the right common femoral artery over 2.5 cm, with apparent collateral  reconstitution distally.  ?- No abdominal aortic aneurysm, dissection, or significant plaque is identified.  ? ?SOLID VISCERA:  ?- Liver: Normal.  ?- Gallbladder: No acute findings.  ?- Pancreas: Normal.  ?- Adrenal glands

## 2021-06-11 NOTE — Patient Instructions (Signed)
Medication Instructions:  ?INCREASE YOUR ELIQUIS TO 5 MG TWICE A DAY  ?OK TO TAKE 2 OF YOUR 2.5 MG TABLETS TWICE A DAY UNTIL YOU FINISH CURRENT RX  ? ?*If you need a refill on your cardiac medications before your next appointment, please call your pharmacy* ? ?Lab Work: ?NONE ? ?Testing/Procedures: ?NONE ? ?Follow-Up: ?At [facility], you and your health needs are our priority.  As part of our continuing mission to provide you with exceptional heart care, we have created designated Provider Care Teams.  These Care Teams include your primary Cardiologist (physician) and Advanced Practice Providers (APPs -  Physician Assistants and Nurse Practitioners) who all work together to provide you with the care you need, when you need it. ? ?We recommend signing up for the patient portal called "MyChart".  Sign up information is provided on this After Visit Summary.  MyChart is used to connect with patients for Virtual Visits (Telemedicine).  Patients are able to view lab/test results, encounter notes, upcoming appointments, etc.  Non-urgent messages can be sent to your provider as well.   ?To learn more about what you can do with MyChart, go to [email or web address].   ? ?Your next appointment:   ?3 month(s) ? ?The format for your next appointment:   ?In Person ? ?Provider:   ?Buford Dresser, MD  ? ? ? ? ?

## 2021-06-22 ENCOUNTER — Other Ambulatory Visit: Payer: Self-pay

## 2021-06-22 ENCOUNTER — Other Ambulatory Visit: Payer: Self-pay | Admitting: Family Medicine

## 2021-06-22 DIAGNOSIS — H04123 Dry eye syndrome of bilateral lacrimal glands: Secondary | ICD-10-CM | POA: Diagnosis not present

## 2021-06-22 DIAGNOSIS — H11132 Conjunctival pigmentations, left eye: Secondary | ICD-10-CM | POA: Diagnosis not present

## 2021-06-22 DIAGNOSIS — E1142 Type 2 diabetes mellitus with diabetic polyneuropathy: Secondary | ICD-10-CM

## 2021-06-23 ENCOUNTER — Other Ambulatory Visit: Payer: Self-pay

## 2021-07-16 ENCOUNTER — Encounter: Payer: Self-pay | Admitting: Family Medicine

## 2021-07-17 MED ORDER — METFORMIN HCL ER 500 MG PO TB24: 1000.0000 mg | ORAL_TABLET | Freq: Every day | ORAL | 3 refills | Status: DC

## 2021-08-28 ENCOUNTER — Telehealth (HOSPITAL_BASED_OUTPATIENT_CLINIC_OR_DEPARTMENT_OTHER): Payer: Self-pay | Admitting: Cardiology

## 2021-08-28 DIAGNOSIS — I4821 Permanent atrial fibrillation: Secondary | ICD-10-CM

## 2021-08-28 MED ORDER — APIXABAN 5 MG PO TABS: 5.0000 mg | ORAL_TABLET | Freq: Two times a day (BID) | ORAL | 1 refills | Status: DC

## 2021-08-28 NOTE — Telephone Encounter (Signed)
*  STAT* If patient is at the pharmacy, call can be transferred to refill team.   1. Which medications need to be refilled? (please list name of each medication and dose if known)  apixaban (ELIQUIS) 5 MG TABS tablet  2. Which pharmacy/location (including street and city if local pharmacy) is medication to be sent to? [id number] [place], [place] - [address]  3. Do they need a 30 day or 90 day supply? 90 with refills   Pharmacy needs a new RX . She was on 2.5 mg before but is now on the 5 mg dose

## 2021-08-28 NOTE — Telephone Encounter (Signed)
Please review for refill. Thank you! 

## 2021-08-28 NOTE — Telephone Encounter (Signed)
Eliquis '5mg'$  refill request received. Patient is 87 years old, weight-74.2kg, Crea-1.06 on 04/17/2021, Diagnosis-Afib, and last seen by Dr. [name] on 06/11/2021. Dose is appropriate based on dosing criteria. Will send in refill to requested pharmacy.

## 2021-09-09 ENCOUNTER — Other Ambulatory Visit: Payer: Self-pay | Admitting: Family Medicine

## 2021-09-24 ENCOUNTER — Encounter (HOSPITAL_BASED_OUTPATIENT_CLINIC_OR_DEPARTMENT_OTHER): Payer: Self-pay | Admitting: Cardiology

## 2021-09-24 ENCOUNTER — Ambulatory Visit (INDEPENDENT_AMBULATORY_CARE_PROVIDER_SITE_OTHER): Payer: Medicare Other | Admitting: Cardiology

## 2021-09-24 VITALS — BP 138/80 | HR 72 | Ht 61.0 in | Wt 162.3 lb

## 2021-09-24 DIAGNOSIS — I10 Essential (primary) hypertension: Secondary | ICD-10-CM

## 2021-09-24 DIAGNOSIS — I4821 Permanent atrial fibrillation: Secondary | ICD-10-CM

## 2021-09-24 DIAGNOSIS — Z9889 Other specified postprocedural states: Secondary | ICD-10-CM | POA: Diagnosis not present

## 2021-09-24 DIAGNOSIS — Z86718 Personal history of other venous thrombosis and embolism: Secondary | ICD-10-CM | POA: Diagnosis not present

## 2021-09-24 DIAGNOSIS — Z7901 Long term (current) use of anticoagulants: Secondary | ICD-10-CM | POA: Diagnosis not present

## 2021-09-24 DIAGNOSIS — D6869 Other thrombophilia: Secondary | ICD-10-CM

## 2021-09-24 NOTE — Progress Notes (Signed)
Cardiology Office Note:    Date:  09/24/2021   ID:  Nicole Jimenez, DOB 30-Jul-1934, MRN [id number]  PCP:  Leamon Arnt, MD  Cardiologist:  Buford Dresser, MD  Referring MD: Leamon Arnt, MD   CC: Follow-up for atrial fibrillation  History of Present Illness:    Nicole Jimenez is a 87 y.o. 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 She was initially seen 06/11/2021 as a new consult at the request of Leamon Arnt, MD for the evaluation and management of permanent atrial fibrillation.  Cardiovascular risk factors: Prior clinical ASCVD:  DC Cardioversion 03/21/2018, she confirms that she was unable to tell if this procedure was successful. She never felt symptoms such as palpitations. She reports having a blood clot previously while she was in [place], and was subsequently prescribed 2.5 mg Eliquis twice daily. Comorbid conditions: Hypertension, Hyperlipidemia, Diabetes  Prior cardiac testing and/or incidental findings on other testing (ie coronary calcium): She notes that the majority of her providers were at [facility] previously. TTE 02/2018 with normal LV function and mild MR/TR. Exercise level: Lately she has not been formally exercising. She is mostly sedentary, often sitting at the computer. Her driveway has a very slight incline. She has noticed that when she is walking to the mailbox she becomes a little short winded. She then walks slowly back to the house. She would attribute this to deconditioning. Recently she started doing chair yoga/exercises. She did this 2-3 times last week, and wants to work up to every day. She has tried walking for 15 minutes around the house between rooms when she thinks about it. She endorses arthritis in her lower back,  which causes her to develop an ache in her back after minimal walking. This also contributes to her limited exercise. For an hour or so she will lie down and rest in the evening for her back. Current diet: She "loves eating." Her wellness nurse has recommended she see a nutritionist; she is waiting to be scheduled. Additionally, she complains of edema in her feet which has been "off and on" in the past, which she attributes to enjoying salty foods and being sedentary. This has worsened lately. In the past week or so her edema seems to improve, but then it returns as quickly as by the time she is finished with a morning shower. She has tried wearing compression socks up to the ankle while sleeping at night, which helps improve her edema. Often drinks tea and coffee.  At her last appointment she also complained of a "phlegm-like" sensation in her throat, like she wants to cough all the time. She denied any sputum production.  Today: She had some confusion about her regimen; she has been taking 1 tablet of 10 mg Eliquis BID instead of 1 tablet BID. She told the pharmacy her Eliquis was going to be doubled, and they sent her 10 mg tablets. She has 2 more months worth of 10 mg Eliquis. We reviewed her medications today. After the visit, we also called her pharmacy. They confirmed that she was given 5 mg pills, not 10 mg, so taking one '5mg'$  pill twice daily is the correct dosing.  In clinic today her blood pressure is 160/88, which she attributes to discovering that she was taking her Eliquis incorrectly. Her BP improved to 138/80 on  recheck.  Since her last appointment she had shortness of breath once or twice, usually if she had been inactive for a while. This has been noticeable when walking to the mailbox which is on a slight incline, but her shortness of breath is mild. Additionally she complains of chronic lower back pain, exacerbated by walking.  At least twice a week she makes sure to leave her house to  stay active. In the past she had walked up and down her driveway for 40-97 minutes, but she has fallen out of the habit.   She reports that she will need to schedule a dental extraction.  She denies any palpitations, chest pain, or peripheral edema. No lightheadedness, headaches, syncope, orthopnea, or PND.   Past Medical History:  Diagnosis Date   Arthritis    Depression    Diabetes mellitus without complication (HCC)    Borderline   Diastolic CHF ([place]) 3/53/2992   Echo 11/2016, nl EF but LVH and diastolic dysfunction   GERD (gastroesophageal reflux disease)    Hyperlipidemia    Hypertension    Peripheral arterial disease ([place]) 2018   thromboembolism of femoral artery   Thyroid disease    subclinical hyperthyroidism managed by endocrinology    Past Surgical History:  Procedure Laterality Date   FEMORAL ENDARTERECTOMY Right 11/29/2016    Current Medications: Current Outpatient Medications on File Prior to Visit  Medication Sig   Acetaminophen 500 MG coapsule Take 1,000 mg by mouth as needed.    apixaban (ELIQUIS) 5 MG TABS tablet Take 1 tablet (5 mg total) by mouth 2 (two) times daily.   atenolol (TENORMIN) 100 MG tablet Take 1 tablet (100 mg total) by mouth daily.   Calcium Carb-Cholecalciferol 600-800 MG-UNIT CHEW Chew 1 each by mouth daily.    diclofenac sodium (VOLTAREN) 1 % GEL APPLY 2 G TOPICALLY 4 (FOUR) TIMES DAILY AS NEEDED. **PA DENIED**   diltiazem (CARDIZEM CD) 180 MG 24 hr capsule TAKE 1 CAPSULE (180 MG TOTAL) BY MOUTH EVERY MORNING.   Glucosamine-MSM-Hyaluronic Acd (JOINT HEALTH PO) Take 1 tablet by mouth daily.   glucose blood (ONETOUCH ULTRA) test strip USE TWICE DAILY TO CHECK BLOOD SUGAR - E11.40   Krill Oil 500 MG CAPS Take 500 mg by mouth daily.   lisinopril-hydrochlorothiazide (ZESTORETIC) 20-25 MG tablet Take 1 tablet by mouth daily.   metFORMIN (GLUCOPHAGE-XR) 500 MG 24 hr tablet Take 2 tablets (1,000 mg total) by mouth daily with breakfast.    Multiple Vitamins-Minerals (MULTIVITAMIN ADULT PO) Take by mouth.   ONETOUCH ULTRA test strip USE AS INSTRUCTED   rosuvastatin (CRESTOR) 40 MG tablet Take 40 mg by mouth daily.   TRINTELLIX 10 MG TABS tablet TAKE 1 TABLET BY MOUTH EVERY DAY   alendronate (FOSAMAX) 70 MG tablet Take 1 tablet (70 mg total) by mouth once a week. Take with a full glass of water on an empty stomach. (Patient not taking: Reported on 06/05/2021)   No current facility-administered medications on file prior to visit.     Allergies:   Amoxicillin-pot clavulanate, Amoxicillin, and Clarithromycin   Social History   Tobacco Use   Smoking status: Never   Smokeless tobacco: Never  Vaping Use   Vaping Use: Never used  Substance Use Topics   Alcohol use: No   Drug use: No    Family History: family history includes Arthritis in her mother and sister; Diabetes in her father and sister; Healthy in her daughter, son, and son; Heart attack in her father and  mother; Hyperlipidemia in her father; Other in her son.  ROS:   Please see the history of present illness. (+) Chronic lower back pain (+) Shortness of breath All other systems are reviewed and negative.    EKGs/Labs/Other Studies Reviewed:    The following studies were reviewed today:  Cardioversion 03/21/2018 ([facility]): IMPRESSION: Successful direct current cardioversion with restoration of  sinus rhythm from atrial fibrillation with no immediate complication.   TTE 02/17/2018: Summary  Normal left ventricular size and systolic function with no appreciable  segmental abnormality.  Ejection fraction is visually estimated at 60-65%.  Mild left ventricular hypertrophy.  Mild tricuspid regurgitation.  Mild mitral regurgitation.  Normal size left atrium.   ABI, LE Arterial and Venous Dopplers of 2019.  CTA Abdominal Aorta and Bilateral Iliofemoral Runoff ([facility]) 11/28/2016: FINDINGS:   VASCULAR:  - Focal occlusion of the right common  femoral artery over 2.5 cm, with apparent collateral reconstitution distally.  - No abdominal aortic aneurysm, dissection, or significant plaque is identified.   SOLID VISCERA:  - Liver: Normal.  - Gallbladder: No acute findings.  - Pancreas: Normal.  - Adrenal glands: Normal.  - Spleen: Normal.  - Kidneys: Normal.   GI:  - No bowel obstruction.  - No evidence of acute appendicitis.   PERITONEAL CAVITY/RETROPERITONEUM:  - No free fluid.  - No pneumoperitoneum.  - No lymphadenopathy.  - Aorta, IVC, iliac arteries, and major visceral arteries are grossly normal.   PELVIS:  - Small uterine fibroids.   VISUALIZED LOWER THORAX:  - No acute abnormalities.   MUSCULOSKELETAL:  - Lumbar DDD.   MISC:  - N/A or as above.   IMPRESSION:  Right common femoral artery occlusion, appears embolic.   EKG:  EKG is personally reviewed. 09/24/2021:  atrial fibrillation at 72 bpm   06/11/2021: atrial fibrillation at 60 bpm  Recent Labs: 04/17/2021: ALT 16; BUN 21; Creatinine, Ser 1.06; Hemoglobin 14.5; Platelets 158.0; Potassium 5.1; Sodium 143; TSH 0.58   Recent Lipid Panel    Component Value Date/Time   CHOL 177 04/17/2021 1211   TRIG 122.0 04/17/2021 1211   HDL 73.10 04/17/2021 1211   CHOLHDL 2 04/17/2021 1211   VLDL 24.4 04/17/2021 1211   LDLCALC 80 04/17/2021 1211   LDLCALC 72 01/24/2017 1621    Physical Exam:    VS:  BP 138/80 (BP Location: Right Arm, Patient Position: Sitting, Cuff Size: Normal)   Pulse 72   Ht '5\' 1"'$  (1.549 m)   Wt 162 lb 4.8 oz (73.6 kg)   BMI 30.67 kg/m     Wt Readings from Last 3 Encounters:  09/24/21 162 lb 4.8 oz (73.6 kg)  06/11/21 163 lb 8 oz (74.2 kg)  06/05/21 163 lb 9.6 oz (74.2 kg)    GEN: Well nourished, well developed in no acute distress HEENT: Normal, moist mucous membranes NECK: No JVD CARDIAC: Irregularly irregular, normal S1 and S2, no rubs or gallops. No murmur. VASCULAR: Radial and DP pulses 2+ bilaterally. No carotid  bruits RESPIRATORY:  Clear to auscultation without rales, wheezing or rhonchi  ABDOMEN: Soft, non-tender, non-distended MUSCULOSKELETAL:  Ambulates independently SKIN: Warm and dry, very trivial nonpitting LE edema NEUROLOGIC:  Alert and oriented x 3. No focal neuro deficits noted. PSYCHIATRIC:  Normal affect    ASSESSMENT:    1. Permanent atrial fibrillation ([place])   2. History of arterial embolism   3. History of embolectomy   4. Essential hypertension   5. Secondary hypercoagulable state ([place])  6. Long term current use of anticoagulant     PLAN:    Permanent atrial fibrillation -CHA2DS2-VASc Score = 9 [CHF History: 1, HTN History: 1, Diabetes History: 1, Stroke History: 2 (thromboembolism), Vascular Disease History: 1, Age Score: 2, Gender Score: 1].  Therefore, the patient's annual risk of stroke is 12.2 %.    Also with prior embolic occlusion of femoral artery, but this was prior to diagnosis of afib/anticoagulation -confirmed with her pharmacy that she is on apixaban 5 mg BID -continue atenolol, diltiazem  Hypertension -improved on recheck -stressed salt avoidance. She is working on activity -continue atenolol, diltiazem, lisinoprol-HCTZ  Type II diabetes Hyperlipidemia -continue rosuvastatin -no aspirin as she is on apixaban -currently on metformin. Consider SGLT2i if she has issues with diastolic heart failure, though today she appears euvolemic  Cardiac risk counseling and prevention recommendations: -recommend heart healthy/Mediterranean diet, with whole grains, fruits, vegetable, fish, lean meats, nuts, and olive oil. Limit salt. -recommend moderate walking, 3-5 times/week for 30-50 minutes each session. Aim for at least 150 minutes.week. Goal should be pace of 3 miles/hours, or walking 1.5 miles in 30 minutes -recommend avoidance of tobacco products. Avoid excess alcohol.  Plan for follow up: 3 months or sooner as needed, per patient preference  Buford Dresser, MD, PhD, [facility]    Medication Adjustments/Labs and Tests Ordered: Current medicines are reviewed at length with the patient today.  Concerns regarding medicines are outlined above.   Orders Placed This Encounter  Procedures   EKG 12-Lead   No orders of the defined types were placed in this encounter.  Patient Instructions  Medication Instructions:  Your Physician recommend you continue on your current medication as directed.    Make sure you are taking Eliquis 5 mg (1 tablet) twice a day  *If you need a refill on your cardiac medications before your next appointment, please call your pharmacy*   Lab Work: None ordered today   Testing/Procedures: None ordered today   Follow-Up: At [facility], you and your health needs are our priority.  As part of our continuing mission to provide you with exceptional heart care, we have created designated Provider Care Teams.  These Care Teams include your primary Cardiologist (physician) and Advanced Practice Providers (APPs -  Physician Assistants and Nurse Practitioners) who all work together to provide you with the care you need, when you need it.  We recommend signing up for the patient portal called "MyChart".  Sign up information is provided on this After Visit Summary.  MyChart is used to connect with patients for Virtual Visits (Telemedicine).  Patients are able to view lab/test results, encounter notes, upcoming appointments, etc.  Non-urgent messages can be sent to your provider as well.   To learn more about what you can do with MyChart, go to [email or web address].    Your next appointment:   3 month(s)  The format for your next appointment:   In Person  Provider:   Buford Dresser, MD{         I,Mathew Stumpf,acting as a scribe for Buford Dresser, MD.,have documented all relevant documentation on the behalf of Buford Dresser, MD,as directed by  Buford Dresser, MD while in the presence of Buford Dresser, MD.  I, Buford Dresser, MD, have reviewed all documentation for this visit. The documentation on 09/24/21 for the exam, diagnosis, procedures, and orders are all accurate and complete.   Signed, Buford Dresser, MD PhD 09/24/2021 1:16 PM  [facility]

## 2021-09-24 NOTE — Patient Instructions (Addendum)
Medication Instructions:  Your Physician recommend you continue on your current medication as directed.    Make sure you are taking Eliquis 5 mg (1 tablet) twice a day  *If you need a refill on your cardiac medications before your next appointment, please call your pharmacy*   Lab Work: None ordered today   Testing/Procedures: None ordered today   Follow-Up: At [facility], you and your health needs are our priority.  As part of our continuing mission to provide you with exceptional heart care, we have created designated Provider Care Teams.  These Care Teams include your primary Cardiologist (physician) and Advanced Practice Providers (APPs -  Physician Assistants and Nurse Practitioners) who all work together to provide you with the care you need, when you need it.  We recommend signing up for the patient portal called "MyChart".  Sign up information is provided on this After Visit Summary.  MyChart is used to connect with patients for Virtual Visits (Telemedicine).  Patients are able to view lab/test results, encounter notes, upcoming appointments, etc.  Non-urgent messages can be sent to your provider as well.   To learn more about what you can do with MyChart, go to [email or web address].    Your next appointment:   3 month(s)  The format for your next appointment:   In Person  Provider:   Buford Dresser, MD{

## 2021-10-05 ENCOUNTER — Encounter: Payer: Self-pay | Admitting: Family Medicine

## 2021-10-07 ENCOUNTER — Telehealth: Payer: Self-pay | Admitting: *Deleted

## 2021-10-07 NOTE — Telephone Encounter (Signed)
   Patient Name:  Norman CARMACK  DOB:  1934-06-01  MRN:  [id number]   Primary Cardiologist: Buford Dresser, MD  Chart reviewed as part of pre-operative protocol coverage.   Simple dental extractions are considered low risk procedures per guidelines and generally do not require any specific cardiac clearance. It is also generally accepted that for simple extractions and dental cleanings, there is no need to interrupt blood thinner therapy.  SBE prophylaxis is not required for the patient from a cardiac standpoint. Do NOT hold Eliquis.  Please call with questions.  Richardson Dopp, PA-C 10/07/2021, 1:24 PM

## 2021-10-07 NOTE — Telephone Encounter (Signed)
Notes faxed to surgeon. This phone note will be removed from the preop pool. Richardson Dopp, PA-C  10/07/2021 1:26 PM

## 2021-10-07 NOTE — Telephone Encounter (Signed)
   Pre-operative Risk Assessment    Patient Name: Nicole Jimenez  DOB: 17-Jun-1934 MRN: [id number]     Request for Surgical Clearance    Procedure:  Dental Extraction - Amount of Teeth to be Pulled:  1 LOWER MOLAR  Date of Surgery:  Clearance TBD                                 Surgeon:  Ferd Glassing, DMD Surgeon's Group or Practice Name:  [facility] Phone number:  [phone] Fax number:  [phone]   Type of Clearance Requested:   - Pharmacy:  Hold Apixaban (Eliquis) Per clearance, "Recommend holding the am dose of Eliquis on the day of the procedure and then, after bleeding has stopped, resume the evening dose.  Prior to her procedure, I wanted to request your opinion on her current cardiac health for this type of procedure and treatment recommendations"   Type of Anesthesia:  Local    Additional requests/questions:    Signed, Jeanann Lewandowsky   10/07/2021, 7:24 AM

## 2021-10-22 ENCOUNTER — Encounter: Payer: Medicare Other | Admitting: Family Medicine

## 2021-10-23 ENCOUNTER — Encounter: Payer: Self-pay | Admitting: Family Medicine

## 2021-10-23 ENCOUNTER — Ambulatory Visit (INDEPENDENT_AMBULATORY_CARE_PROVIDER_SITE_OTHER): Payer: Medicare Other | Admitting: Family Medicine

## 2021-10-23 VITALS — BP 130/60 | HR 62 | Temp 97.8°F | Wt 158.0 lb

## 2021-10-23 DIAGNOSIS — I10 Essential (primary) hypertension: Secondary | ICD-10-CM

## 2021-10-23 DIAGNOSIS — M3500 Sicca syndrome, unspecified: Secondary | ICD-10-CM

## 2021-10-23 DIAGNOSIS — E0590 Thyrotoxicosis, unspecified without thyrotoxic crisis or storm: Secondary | ICD-10-CM

## 2021-10-23 DIAGNOSIS — E1169 Type 2 diabetes mellitus with other specified complication: Secondary | ICD-10-CM

## 2021-10-23 DIAGNOSIS — I4821 Permanent atrial fibrillation: Secondary | ICD-10-CM | POA: Diagnosis not present

## 2021-10-23 DIAGNOSIS — E1142 Type 2 diabetes mellitus with diabetic polyneuropathy: Secondary | ICD-10-CM | POA: Diagnosis not present

## 2021-10-23 DIAGNOSIS — Z23 Encounter for immunization: Secondary | ICD-10-CM

## 2021-10-23 DIAGNOSIS — I5032 Chronic diastolic (congestive) heart failure: Secondary | ICD-10-CM

## 2021-10-23 DIAGNOSIS — E782 Mixed hyperlipidemia: Secondary | ICD-10-CM | POA: Diagnosis not present

## 2021-10-23 LAB — LIPID PANEL
Cholesterol: 176 mg/dL (ref 0–200)
HDL: 71.5 mg/dL (ref >39.00)
LDL Cholesterol: 84 mg/dL (ref 0–99)
NonHDL: 104.71
Total CHOL/HDL Ratio: 2
Triglycerides: 106 mg/dL (ref 0.0–149.0)
VLDL: 21.2 mg/dL (ref 0.0–40.0)

## 2021-10-23 LAB — COMPREHENSIVE METABOLIC PANEL
ALT: 16 U/L (ref 0–35)
AST: 23 U/L (ref 0–37)
Albumin: 3.9 g/dL (ref 3.5–5.2)
Alkaline Phosphatase: 66 U/L (ref 39–117)
BUN: 21 mg/dL (ref 6–23)
CO2: 32 mEq/L (ref 19–32)
Calcium: 10 mg/dL (ref 8.4–10.5)
Chloride: 97 mEq/L (ref 96–112)
Creatinine, Ser: 1.08 mg/dL (ref 0.40–1.20)
GFR: 47.05 mL/min — ABNORMAL LOW (ref >60.00)
Glucose, Bld: 206 mg/dL — ABNORMAL HIGH (ref 70–99)
Potassium: 4.9 mEq/L (ref 3.5–5.1)
Sodium: 138 mEq/L (ref 135–145)
Total Bilirubin: 1.4 mg/dL — ABNORMAL HIGH (ref 0.2–1.2)
Total Protein: 7.9 g/dL (ref 6.0–8.3)

## 2021-10-23 LAB — CBC WITH DIFFERENTIAL/PLATELET
Basophils Absolute: 0 10*3/uL (ref 0.0–0.1)
Basophils Relative: 0.8 % (ref 0.0–3.0)
Eosinophils Absolute: 0.1 10*3/uL (ref 0.0–0.7)
Eosinophils Relative: 1.8 % (ref 0.0–5.0)
HCT: 42.4 % (ref 36.0–46.0)
Hemoglobin: 14.1 g/dL (ref 12.0–15.0)
Lymphocytes Relative: 21.5 % (ref 12.0–46.0)
Lymphs Abs: 1.2 10*3/uL (ref 0.7–4.0)
MCHC: 33.3 g/dL (ref 30.0–36.0)
MCV: 91 fl (ref 78.0–100.0)
Monocytes Absolute: 0.5 10*3/uL (ref 0.1–1.0)
Monocytes Relative: 8.7 % (ref 3.0–12.0)
Neutro Abs: 3.9 10*3/uL (ref 1.4–7.7)
Neutrophils Relative %: 67.2 % (ref 43.0–77.0)
Platelets: 140 10*3/uL — ABNORMAL LOW (ref 150.0–400.0)
RBC: 4.66 Mil/uL (ref 3.87–5.11)
RDW: 13.6 % (ref 11.5–15.5)
WBC: 5.8 10*3/uL (ref 4.0–10.5)

## 2021-10-23 LAB — TSH: TSH: 0.42 u[IU]/mL (ref 0.35–5.50)

## 2021-10-23 LAB — T4, FREE: Free T4: 0.92 ng/dL (ref 0.60–1.60)

## 2021-10-23 LAB — HEMOGLOBIN A1C: Hgb A1c MFr Bld: 6.9 % — ABNORMAL HIGH (ref 4.6–6.5)

## 2021-10-23 NOTE — Progress Notes (Unsigned)
Subjective  Chief Complaint  Patient presents with   Annual Exam    Pt here for Annual exam and is not currently fasting     HPI: AMIYRAH Jimenez is a 87 y.o. female who presents to [facility] at [place] today for a Female Wellness Visit. She also has the concerns and/or needs as listed above in the chief complaint. These will be addressed in addition to the Health Maintenance Visit.   Wellness Visit: annual visit with health maintenance review and exam {With-without:32421} Pap  *** Chronic disease f/u and/or acute problem visit: (deemed necessary to be done in addition to the wellness visit): ***  Assessment  1. Type 2 diabetes mellitus with peripheral neuropathy (HCC)   2. Subclinical hyperthyroidism   3. Combined hyperlipidemia associated with type 2 diabetes mellitus ([place])   4. Essential hypertension   5. Chronic diastolic congestive heart failure ([place])   6. Permanent atrial fibrillation ([place])   7. Sjogren's syndrome without extraglandular involvement [facility])      Plan  Female Wellness Visit: Age appropriate Health Maintenance and Prevention measures were discussed with patient. Included topics are cancer screening recommendations, ways to keep healthy (see AVS) including dietary and exercise recommendations, regular eye and dental care, use of seat belts, and avoidance of moderate alcohol use and tobacco use.  BMI: discussed patient's BMI and encouraged positive lifestyle modifications to help get to or maintain a target BMI. HM needs and immunizations were addressed and ordered. See below for orders. See HM and immunization section for updates. Routine labs and screening tests ordered including cmp, cbc and lipids where appropriate. Discussed recommendations regarding Vit D and calcium supplementation (see AVS)  Chronic disease management visit and/or acute problem visit: *** Follow up: No follow-ups on file.  Orders Placed This Encounter  Procedures    Hemoglobin A1c   CBC with Differential/Platelet   Comprehensive metabolic panel   Lipid panel   TSH   Microalbumin / creatinine urine ratio   T4, free   T3   No orders of the defined types were placed in this encounter.     Body mass index is 29.85 kg/m. Wt Readings from Last 3 Encounters:  10/23/21 158 lb (71.7 kg)  09/24/21 162 lb 4.8 oz (73.6 kg)  06/11/21 163 lb 8 oz (74.2 kg)     Patient Active Problem List   Diagnosis Date Noted   History of arterial embolism 08/21/2018    Priority: High    Managed by vascular. 2018     Anticoagulant long-term use 03/06/2018    Priority: High   Permanent atrial fibrillation ([place]) 01/23/2018    Priority: High   Diastolic CHF ([place]) 01/75/1025    Priority: High    Echo 11/2016, nl EF but LVH and diastolic dysfunction      Type 2 diabetes mellitus with peripheral neuropathy ([place]) 01/24/2017    Priority: High   Subclinical hyperthyroidism 2014/02/08    Priority: High    Managed by endocrine     Essential hypertension 02/23/2013    Priority: High   Combined hyperlipidemia associated with type 2 diabetes mellitus ([place]) 02/23/2013    Priority: High   Major depression, chronic 07/18/2012    Priority: High   DJD of right shoulder 01/24/2017    Priority: Medium    Bereavement due to life event 03/05/2016    Priority: Medium     Her husband of 54 years died in 2016-02-09      Osteopenia  08/23/2015    Priority: Medium     DEXA 11/2020: lowest T =-2.3, left femoral neck, elevated FRAX score Major Osteoporotic Fracture: 29.4% Hip Fracture:                19.1% Recommend tx.  dexa 2017: T = -1.0 at femur, elevated FRAX score.  Repeat 2019 Osteopenia with elevated hip frac score of 10%:  T lowest = - 1.8 Major Osteoporotic Fracture: 17.1% Hip Fracture:                10.4% Population:                  [place] ([place]) Risk Factors:                Family Hist. (Parent hip fracture) Offer medications due to elevated hip fracture  risk score.     Idiopathic scoliosis of thoracic spine 07/09/2014    Priority: Medium    Sjogren's syndrome ([place]) 08/04/2012    Priority: Medium    DDD (degenerative disc disease), lumbosacral 07/15/2012    Priority: Medium    Lumbosacral spondylosis 07/15/2012    Priority: Medium    Osteoarthritis of knee 07/15/2012    Priority: Medium    Bilateral edema of lower extremity 01/26/2017    Priority: Low   Bilateral hip bursitis 07/17/2015    Priority: Low   Age-related nuclear cataract of both eyes 03/11/2015    Priority: Low   Insomnia 09/04/2013    Priority: Low   Allergic rhinitis 07/15/2012    Priority: [place] Maintenance  Topic Date Due   Diabetic kidney evaluation - Urine ACR  08/26/2020   INFLUENZA VACCINE  09/08/2021   HEMOGLOBIN A1C  10/18/2021   COVID-19 Vaccine (6 - Pfizer series) 11/08/2021 (Originally 04/15/2021)   OPHTHALMOLOGY EXAM  02/26/2022   Diabetic kidney evaluation - GFR measurement  04/18/2022   FOOT EXAM  10/24/2022   DEXA SCAN  11/14/2022   Pneumonia Vaccine 85+ Years old  Completed   Zoster Vaccines- Shingrix  Completed   HPV VACCINES  Aged Out   Immunization History  Administered Date(s) Administered   Fluad Quad(high Dose 65+) 11/20/2018, 11/28/2020   Influenza Split 01/24/2012   Influenza, High Dose Seasonal PF 12/02/2013, 01/20/2015, 12/28/2016, 10/26/2019   Influenza,inj,Quad PF,6+ Mos 11/21/2015, 11/18/2017   Influenza-Unspecified 01/24/2012, 01/05/2014, 01/05/2014, 01/20/2015   PFIZER Comirnaty(Gray Top)Covid-19 Tri-Sucrose Vaccine 05/20/2020   PFIZER(Purple Top)SARS-COV-2 Vaccination 03/01/2019, 03/22/2019, 11/16/2019   Pfizer Covid-19 Vaccine Bivalent Booster 38yr & up 12/16/2020   Pneumococcal Conjugate-13 03/06/2015, 03/06/2015   Pneumococcal Polysaccharide-23 06/18/2016   Zoster Recombinat (Shingrix) 03/09/2018, 08/21/2018   Zoster, Live 10/09/2012   We updated and reviewed the patient's past history in detail and it is  documented below. Allergies: Patient is allergic to amoxicillin-pot clavulanate, amoxicillin, and clarithromycin. Past Medical History Patient  has a past medical history of Arthritis, Depression, Diabetes mellitus without complication ([place], Diastolic CHF ([place] (16[birth year]1950, GERD (gastroesophageal reflux disease), Hyperlipidemia, Hypertension, Peripheral arterial disease ([place] (2018), and Thyroid disease. Past Surgical History Patient  has a past surgical history that includes Femoral endarterectomy (Right, 11/29/2016). Family History: Patient family history includes Arthritis in her mother and sister; Diabetes in her father and sister; Healthy in her daughter, son, and son; Heart attack in her father and mother; Hyperlipidemia in her father; Other in her son. Social History:  Patient  reports that she has never smoked. She has never used smokeless tobacco. She reports that she does not  drink alcohol and does not use drugs.  Review of Systems: Constitutional: negative for fever or malaise Ophthalmic: negative for photophobia, double vision or loss of vision Cardiovascular: negative for chest pain, dyspnea on exertion, or new LE swelling Respiratory: negative for SOB or persistent cough Gastrointestinal: negative for abdominal pain, change in bowel habits or melena Genitourinary: negative for dysuria or gross hematuria, no abnormal uterine bleeding or disharge Musculoskeletal: negative for new gait disturbance or muscular weakness Integumentary: negative for new or persistent rashes, no breast lumps Neurological: negative for TIA or stroke symptoms Psychiatric: negative for SI or delusions Allergic/Immunologic: negative for hives  Patient Care Team    Relationship Specialty Notifications Start End  Leamon Arnt, MD PCP - General Family Medicine  01/24/17   Buford Dresser, MD PCP - Cardiology Cardiology  09/24/21   Roel Cluck, MD Referring Physician Ophthalmology   01/24/17   Alanson Aly, MD Consulting Physician Endocrinology  01/24/17   Dinah Beers, MD Referring Physician Vascular Surgery  01/24/17   Gaynelle Arabian, MD Consulting Physician Orthopedic Surgery  07/08/17   Dermatology, [place] Physician   11/20/18     Objective  Vitals: BP 130/60   Pulse 62   Temp 97.8 F (36.6 C)   Wt 158 lb (71.7 kg)   SpO2 98%   BMI 29.85 kg/m  General:  Well developed, well nourished, no acute distress  Psych:  Alert and orientedx3,normal mood and affect HEENT:  Normocephalic, atraumatic, non-icteric sclera,  supple neck without adenopathy, mass or thyromegaly Cardiovascular:  Normal S1, S2, RRR without gallop, rub or murmur Respiratory:  Good breath sounds bilaterally, CTAB with normal respiratory effort Gastrointestinal: normal bowel sounds, soft, non-tender, no noted masses. No HSM MSK: no deformities, contusions. Joints are without erythema or swelling.  Skin:  Warm, no rashes or suspicious lesions noted Neurologic:    Mental status is normal. CN 2-11 are normal. Gross motor and sensory exams are normal. Normal gait. No tremor Breast Exam: No mass, skin retraction or nipple discharge is appreciated in either breast. No axillary adenopathy. Fibrocystic changes {Actions; are/are not:16769} noted Pelvic Exam: Normal external genitalia, no vulvar or vaginal lesions present. Clear cervix w/o CMT. Bimanual exam reveals a nontender fundus w/o masses, nl size. No adnexal masses present. No inguinal adenopathy. A PAP smear {WAS/WAS NOT:[phone]::"was not"} performed.   Commons side effects, risks, benefits, and alternatives for medications and treatment plan prescribed today were discussed, and the patient expressed understanding of the given instructions. Patient is instructed to call or message via MyChart if he/she has any questions or concerns regarding our treatment plan. No barriers to understanding were identified. We discussed Red Flag  symptoms and signs in detail. Patient expressed understanding regarding what to do in case of urgent or emergency type symptoms.  Medication list was reconciled, printed and provided to the patient in AVS. Patient instructions and summary information was reviewed with the patient as documented in the AVS. This note was prepared with assistance of Dragon voice recognition software. Occasional wrong-word or sound-a-like substitutions may have occurred due to the inherent limitations of voice recognition software

## 2021-10-23 NOTE — Patient Instructions (Signed)
Please return for a shave biopsy of your mole.   I will release your lab results to you on your MyChart account with further instructions. You may see the results before I do, but when I review them I will send you a message with my report or have my assistant call you if things need to be discussed. Please reply to my message with any questions. Thank you!   If you have any questions or concerns, please don't hesitate to send me a message via MyChart or call the office at [phone]. Thank you for visiting with [place] today! It's our pleasure caring for you.

## 2021-10-24 LAB — MICROALBUMIN / CREATININE URINE RATIO
Creatinine, Urine: 39 mg/dL (ref 20–275)
Microalb Creat Ratio: 21 mcg/mg creat (ref <30)
Microalb, Ur: 0.8 mg/dL

## 2021-10-28 ENCOUNTER — Other Ambulatory Visit: Payer: Self-pay | Admitting: Family Medicine

## 2021-11-06 ENCOUNTER — Encounter: Payer: Self-pay | Admitting: Family Medicine

## 2021-11-06 ENCOUNTER — Ambulatory Visit (INDEPENDENT_AMBULATORY_CARE_PROVIDER_SITE_OTHER): Payer: Medicare Other | Admitting: Family Medicine

## 2021-11-06 VITALS — BP 130/80 | HR 64 | Temp 97.7°F | Ht 61.0 in | Wt 159.8 lb

## 2021-11-06 DIAGNOSIS — D229 Melanocytic nevi, unspecified: Secondary | ICD-10-CM

## 2021-11-06 DIAGNOSIS — L820 Inflamed seborrheic keratosis: Secondary | ICD-10-CM

## 2021-11-06 NOTE — Progress Notes (Signed)
Subjective  CC:  Chief Complaint  Patient presents with   Biopsy    Pt stated that she would like to have a biopsy of a mole that is located above Rt breast    HPI: Nicole Jimenez is a 87 y.o. female who presents to the office today to address the problems listed above in the chief complaint. 87 year old female presents due to atypical mole is located on her right chest.  Mole is been there for years however it is growing and now is becoming darker and one half.  Otherwise feels well Assessment  1. Atypical mole      Plan  Atypical mole: Biopsy to ensure benign.  Routine shave biopsy without complications.  See below.  Routine postprocedure care instructions given.  Send specimen to pathology.  Await results  Follow up: As scheduled 01/26/2022  No orders of the defined types were placed in this encounter.  No orders of the defined types were placed in this encounter.     I reviewed the patients updated PMH, FH, and SocHx.    Patient Active Problem List   Diagnosis Date Noted   History of arterial embolism 08/21/2018    Priority: High   Anticoagulant long-term use 03/06/2018    Priority: High   Permanent atrial fibrillation ([place]) 01/23/2018    Priority: High   Diastolic CHF ([place]) 56/21/3086    Priority: High   Type 2 diabetes mellitus with peripheral neuropathy ([place]) 01/24/2017    Priority: High   Subclinical hyperthyroidism 01/21/2014    Priority: High   Essential hypertension 02/23/2013    Priority: High   Combined hyperlipidemia associated with type 2 diabetes mellitus ([place]) 02/23/2013    Priority: High   Major depression, chronic 07/18/2012    Priority: High   DJD of right shoulder 01/24/2017    Priority: Medium    Bereavement due to life event 03/05/2016    Priority: Medium    Osteopenia 08/23/2015    Priority: Medium    Idiopathic scoliosis of thoracic spine 07/09/2014    Priority: Medium    Sjogren's syndrome ([place]) 08/04/2012    Priority: Medium     DDD (degenerative disc disease), lumbosacral 07/15/2012    Priority: Medium    Lumbosacral spondylosis 07/15/2012    Priority: Medium    Osteoarthritis of knee 07/15/2012    Priority: Medium    Bilateral edema of lower extremity 01/26/2017    Priority: Low   Bilateral hip bursitis 07/17/2015    Priority: Low   Age-related nuclear cataract of both eyes 03/11/2015    Priority: Low   Insomnia 09/04/2013    Priority: Low   Allergic rhinitis 07/15/2012    Priority: Low   Current Meds  Medication Sig   Acetaminophen 500 MG coapsule Take 1,000 mg by mouth as needed.    alendronate (FOSAMAX) 70 MG tablet TAKE 1 TABLET BY MOUTH ONCE A WEEK. TAKE WITH A FULL GLASS OF WATER ON AN EMPTY STOMACH.   apixaban (ELIQUIS) 5 MG TABS tablet Take 1 tablet (5 mg total) by mouth 2 (two) times daily.   atenolol (TENORMIN) 100 MG tablet Take 1 tablet (100 mg total) by mouth daily.   Calcium Carb-Cholecalciferol 600-800 MG-UNIT CHEW Chew 1 each by mouth daily.    diclofenac sodium (VOLTAREN) 1 % GEL APPLY 2 G TOPICALLY 4 (FOUR) TIMES DAILY AS NEEDED. **PA DENIED**   diltiazem (CARDIZEM CD) 180 MG 24 hr capsule TAKE 1 CAPSULE (180 MG TOTAL) BY MOUTH  EVERY MORNING.   Glucosamine-MSM-Hyaluronic Acd (JOINT HEALTH PO) Take 1 tablet by mouth daily.   glucose blood (ONETOUCH ULTRA) test strip USE TWICE DAILY TO CHECK BLOOD SUGAR - E11.40   Krill Oil 500 MG CAPS Take 500 mg by mouth daily.   lisinopril-hydrochlorothiazide (ZESTORETIC) 20-25 MG tablet Take 1 tablet by mouth daily.   metFORMIN (GLUCOPHAGE-XR) 500 MG 24 hr tablet Take 2 tablets (1,000 mg total) by mouth daily with breakfast.   Multiple Vitamins-Minerals (MULTIVITAMIN ADULT PO) Take by mouth.   ONETOUCH ULTRA test strip USE AS INSTRUCTED   rosuvastatin (CRESTOR) 40 MG tablet Take 40 mg by mouth daily.   TRINTELLIX 10 MG TABS tablet TAKE 1 TABLET BY MOUTH EVERY DAY    Allergies: Patient is allergic to amoxicillin-pot clavulanate, amoxicillin, and  clarithromycin. Family History: Patient family history includes Arthritis in her mother and sister; Diabetes in her father and sister; Healthy in her daughter, son, and son; Heart attack in her father and mother; Hyperlipidemia in her father; Other in her son. Social History:  Patient  reports that she has never smoked. She has never used smokeless tobacco. She reports that she does not drink alcohol and does not use drugs.  Review of Systems: Constitutional: Negative for fever malaise or anorexia Cardiovascular: negative for chest pain Respiratory: negative for SOB or persistent cough Gastrointestinal: negative for abdominal pain  Objective  Vitals: BP 130/80   Pulse 64   Temp 97.7 F (36.5 C)   Ht '5\' 1"'$  (1.549 m)   Wt 159 lb 12.8 oz (72.5 kg)   SpO2 97%   BMI 30.19 kg/m  General: no acute distress , A&Ox3  Skin:  Warm, no rashes, 3 to 4 mm irregularly shaped irritated mole right chest above breast.  Shave Biopsy Procedure Note  Pre-operative Diagnosis: Suspicious lesion  Post-operative Diagnosis: same  Locations:above, right, breast chest  Indications: Irregular margins and irrigated colors, darkening and growing.  Anesthesia: Lidocaine 1% with epinephrine   Procedure Details   Patient informed of the risks (including bleeding and infection) and benefits of the  procedure and Verbal informed consent obtained.  The lesion and surrounding area were given a sterile prep using alcohol. A derma blade was used to shave an area of skin approximately  0.4 cm by  0.3 cm.  Hemostasis achieved with alumuninum chloride. Antibiotic ointment and a sterile dressing applied.  The specimen was sent for pathologic examination. The patient tolerated the procedure well.  EBL: 0 ml  Condition: Stable  Complications: none. Sent to path  Commons side effects, risks, benefits, and alternatives for medications and treatment plan prescribed today were discussed, and the patient  expressed understanding of the given instructions. Patient is instructed to call or message via MyChart if he/she has any questions or concerns regarding our treatment plan. No barriers to understanding were identified. We discussed Red Flag symptoms and signs in detail. Patient expressed understanding regarding what to do in case of urgent or emergency type symptoms.  Medication list was reconciled, printed and provided to the patient in AVS. Patient instructions and summary information was reviewed with the patient as documented in the AVS. This note was prepared with assistance of Dragon voice recognition software. Occasional wrong-word or sound-a-like substitutions may have occurred due to the inherent limitations of voice recognition software  This visit occurred during the SARS-CoV-2 public health emergency.  Safety protocols were in place, including screening questions prior to the visit, additional usage of staff PPE, and extensive cleaning  of exam room while observing appropriate contact time as indicated for disinfecting solutions.

## 2021-11-06 NOTE — Patient Instructions (Signed)
Please follow up if symptoms do not improve or as needed.    Skin Biopsy, Care After The following information offers guidance on how to care for yourself after your procedure. Your health care provider may also give you more specific instructions. If you have problems or questions, contact your health care provider. What can I expect after the procedure? After the procedure, it is common to have: Soreness or mild pain. Bruising. Itching. Some redness and swelling. Follow these instructions at home: Biopsy site care  Follow instructions from your health care provider about how to take care of your biopsy site. Make sure you: Wash your hands with soap and water for at least 20 seconds before and after you change your bandage (dressing). If soap and water are not available, use hand sanitizer. Change your dressing as told by your health care provider. Leave stitches (sutures), skin glue, or adhesive strips in place. These skin closures may need to stay in place for 2 weeks or longer. If adhesive strip edges start to loosen and curl up, you may trim the loose edges. Do not remove adhesive strips completely unless your health care provider tells you to do that. Check your biopsy site every day for signs of infection. Check for: More redness, swelling, or pain. Fluid or blood. Warmth. Pus or a bad smell. Do not take baths, swim, or use a hot tub until your health care provider approves. Ask your health care provider if you may take showers. You may only be allowed to take sponge baths. General instructions Take over-the-counter and prescription medicines only as told by your health care provider. Return to your normal activities as told by your health care provider. Ask your health care provider what activities are safe for you. Keep all follow-up visits. This is important. Contact a health care provider if: You have more redness, swelling, or pain around your biopsy site. You have fluid or  blood coming from your biopsy site. Your biopsy site feels warm to the touch. You have pus or a bad smell coming from your biopsy site. You have a fever. Your sutures, skin glue, or adhesive strips loosen or come off sooner than expected. Get help right away if: You have bleeding that does not stop with pressure or a dressing. Summary After the procedure, it is common to have soreness, bruising, and itching at the site. Follow instructions from your health care provider about how to take care of your biopsy site. Check your biopsy site every day for signs of infection. Contact a health care provider if you have more redness, swelling, or pain around your biopsy site, or your biopsy site feels warm to the touch. Keep all follow-up visits. This is important. This information is not intended to replace advice given to you by your health care provider. Make sure you discuss any questions you have with your health care provider. Document Revised: 08/26/2020 Document Reviewed: 08/26/2020 Elsevier Patient Education  [place].

## 2021-11-06 NOTE — Addendum Note (Signed)
Addended by: Darlina Rumpf A on: 11/06/2021 02:17 PM   Modules accepted: Orders

## 2021-11-12 ENCOUNTER — Encounter: Payer: Self-pay | Admitting: Family Medicine

## 2021-11-13 ENCOUNTER — Other Ambulatory Visit: Payer: Self-pay

## 2021-11-13 MED ORDER — VORTIOXETINE HBR 10 MG PO TABS: 10.0000 mg | ORAL_TABLET | Freq: Every day | ORAL | 3 refills | Status: DC

## 2021-11-29 DIAGNOSIS — Z23 Encounter for immunization: Secondary | ICD-10-CM | POA: Diagnosis not present

## 2021-12-06 ENCOUNTER — Encounter (HOSPITAL_BASED_OUTPATIENT_CLINIC_OR_DEPARTMENT_OTHER): Payer: Self-pay

## 2021-12-07 ENCOUNTER — Other Ambulatory Visit: Payer: Self-pay

## 2021-12-07 DIAGNOSIS — I4821 Permanent atrial fibrillation: Secondary | ICD-10-CM

## 2021-12-07 MED ORDER — APIXABAN 5 MG PO TABS: 5.0000 mg | ORAL_TABLET | Freq: Two times a day (BID) | ORAL | 1 refills | Status: DC

## 2021-12-07 NOTE — Telephone Encounter (Signed)
Prescription refill request for Eliquis received. Indication:Afib  Last office visit:09/24/21 [name])  Scr: 1.08 (10/23/21)  Age: 87 Weight: 72.5kg  Appropriate dose and refill sent to requested pharmacy.

## 2021-12-12 ENCOUNTER — Other Ambulatory Visit: Payer: Self-pay | Admitting: Family Medicine

## 2021-12-14 ENCOUNTER — Encounter: Payer: Self-pay | Admitting: Family Medicine

## 2021-12-15 ENCOUNTER — Other Ambulatory Visit: Payer: Self-pay

## 2021-12-15 MED ORDER — ROSUVASTATIN CALCIUM 40 MG PO TABS: 40.0000 mg | ORAL_TABLET | Freq: Every day | ORAL | 3 refills | Status: DC

## 2021-12-17 DIAGNOSIS — H02834 Dermatochalasis of left upper eyelid: Secondary | ICD-10-CM | POA: Diagnosis not present

## 2021-12-17 DIAGNOSIS — H52203 Unspecified astigmatism, bilateral: Secondary | ICD-10-CM | POA: Diagnosis not present

## 2021-12-17 DIAGNOSIS — H04123 Dry eye syndrome of bilateral lacrimal glands: Secondary | ICD-10-CM | POA: Diagnosis not present

## 2021-12-17 DIAGNOSIS — H43813 Vitreous degeneration, bilateral: Secondary | ICD-10-CM | POA: Diagnosis not present

## 2021-12-17 DIAGNOSIS — Z7984 Long term (current) use of oral hypoglycemic drugs: Secondary | ICD-10-CM | POA: Diagnosis not present

## 2021-12-17 DIAGNOSIS — E119 Type 2 diabetes mellitus without complications: Secondary | ICD-10-CM | POA: Diagnosis not present

## 2021-12-17 DIAGNOSIS — Z961 Presence of intraocular lens: Secondary | ICD-10-CM | POA: Diagnosis not present

## 2021-12-17 DIAGNOSIS — H26492 Other secondary cataract, left eye: Secondary | ICD-10-CM | POA: Diagnosis not present

## 2021-12-17 DIAGNOSIS — H35371 Puckering of macula, right eye: Secondary | ICD-10-CM | POA: Diagnosis not present

## 2021-12-17 DIAGNOSIS — H35362 Drusen (degenerative) of macula, left eye: Secondary | ICD-10-CM | POA: Diagnosis not present

## 2021-12-17 DIAGNOSIS — H11132 Conjunctival pigmentations, left eye: Secondary | ICD-10-CM | POA: Diagnosis not present

## 2021-12-17 DIAGNOSIS — H02831 Dermatochalasis of right upper eyelid: Secondary | ICD-10-CM | POA: Diagnosis not present

## 2021-12-29 ENCOUNTER — Ambulatory Visit (INDEPENDENT_AMBULATORY_CARE_PROVIDER_SITE_OTHER): Payer: Medicare Other | Admitting: Cardiology

## 2021-12-29 ENCOUNTER — Encounter (HOSPITAL_BASED_OUTPATIENT_CLINIC_OR_DEPARTMENT_OTHER): Payer: Self-pay | Admitting: Cardiology

## 2021-12-29 VITALS — BP 130/68 | HR 79 | Ht 61.2 in | Wt 156.4 lb

## 2021-12-29 DIAGNOSIS — I10 Essential (primary) hypertension: Secondary | ICD-10-CM | POA: Diagnosis not present

## 2021-12-29 DIAGNOSIS — Z86718 Personal history of other venous thrombosis and embolism: Secondary | ICD-10-CM | POA: Diagnosis not present

## 2021-12-29 DIAGNOSIS — D6869 Other thrombophilia: Secondary | ICD-10-CM | POA: Diagnosis not present

## 2021-12-29 DIAGNOSIS — E782 Mixed hyperlipidemia: Secondary | ICD-10-CM | POA: Diagnosis not present

## 2021-12-29 DIAGNOSIS — Z7901 Long term (current) use of anticoagulants: Secondary | ICD-10-CM

## 2021-12-29 DIAGNOSIS — I4821 Permanent atrial fibrillation: Secondary | ICD-10-CM

## 2021-12-29 DIAGNOSIS — Z9889 Other specified postprocedural states: Secondary | ICD-10-CM | POA: Diagnosis not present

## 2021-12-29 DIAGNOSIS — E1169 Type 2 diabetes mellitus with other specified complication: Secondary | ICD-10-CM

## 2021-12-29 NOTE — Progress Notes (Unsigned)
Cardiology Office Note:    Date:  12/30/2021   ID:  Nicole Jimenez, DOB 1934-04-21, MRN [id number]  PCP:  Leamon Arnt, MD  Cardiologist:  Buford Dresser, MD  Referring MD: Leamon Arnt, MD   CC: Follow-up  History of Present Illness:    Nicole Jimenez is a 87 y.o. 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 She was initially seen 06/11/2021 as a new consult at the request of Leamon Arnt, MD for the evaluation and management of permanent atrial fibrillation.  Cardiovascular risk factors: Prior clinical ASCVD:  DC Cardioversion 03/21/2018, she confirms that she was unable to tell if this procedure was successful. She never felt symptoms such as palpitations. She reports having a blood clot previously while she was in [place]. Comorbid conditions: Hypertension, Hyperlipidemia, Diabetes  Prior cardiac testing and/or incidental findings on other testing (ie coronary calcium): She notes that the majority of her providers were at [facility] previously. TTE 02/2018 with normal LV function and mild MR/TR.  Today: Doing well overall. Trying to be more active but muscles get sore.  Continues to work on increasing activity. Has chronic bilateral ankle edema, unchanged, helped with compression stockings.  Denies chest pain, shortness of breath at rest or with normal exertion. No PND, orthopnea, or unexpected weight gain. No syncope or palpitations.   Past Medical History:  Diagnosis Date   Arthritis    Depression    Diabetes mellitus without complication (HCC)    Borderline   Diastolic CHF ([place]) 4/00/8676   Echo 11/2016, nl EF but LVH and diastolic dysfunction   GERD (gastroesophageal reflux disease)    Hyperlipidemia    Hypertension    Peripheral  arterial disease ([place]) 2018   thromboembolism of femoral artery   Thyroid disease    subclinical hyperthyroidism managed by endocrinology    Past Surgical History:  Procedure Laterality Date   FEMORAL ENDARTERECTOMY Right 11/29/2016    Current Medications: Current Outpatient Medications on File Prior to Visit  Medication Sig   Acetaminophen 500 MG coapsule Take 1,000 mg by mouth as needed.    alendronate (FOSAMAX) 70 MG tablet TAKE 1 TABLET BY MOUTH ONCE A WEEK. TAKE WITH A FULL GLASS OF WATER ON AN EMPTY STOMACH.   apixaban (ELIQUIS) 5 MG TABS tablet Take 1 tablet (5 mg total) by mouth 2 (two) times daily.   atenolol (TENORMIN) 100 MG tablet Take 1 tablet (100 mg total) by mouth daily.   Calcium Carb-Cholecalciferol 600-800 MG-UNIT CHEW Chew 1 each by mouth daily.    diclofenac sodium (VOLTAREN) 1 % GEL APPLY 2 G TOPICALLY 4 (FOUR) TIMES DAILY AS NEEDED. **PA DENIED**   diltiazem (CARDIZEM CD) 180 MG 24 hr capsule TAKE 1 CAPSULE (180 MG TOTAL) BY MOUTH EVERY MORNING.   Glucosamine-MSM-Hyaluronic Acd (JOINT HEALTH PO) Take 1 tablet by mouth daily.   glucose blood (ONETOUCH ULTRA) test strip USE TWICE DAILY TO CHECK BLOOD SUGAR - E11.40   Krill Oil 500 MG CAPS Take 500 mg by mouth daily.   lisinopril-hydrochlorothiazide (ZESTORETIC) 20-25 MG tablet Take 1 tablet by mouth daily.   metFORMIN (GLUCOPHAGE-XR) 500 MG 24 hr tablet Take 2 tablets (1,000 mg total) by mouth daily with breakfast.   Multiple Vitamins-Minerals (MULTIVITAMIN ADULT PO) Take by mouth.   ONETOUCH ULTRA test strip USE AS INSTRUCTED  rosuvastatin (CRESTOR) 40 MG tablet Take 1 tablet (40 mg total) by mouth daily.   vortioxetine HBr (TRINTELLIX) 10 MG TABS tablet Take 1 tablet (10 mg total) by mouth daily.   No current facility-administered medications on file prior to visit.     Allergies:   Amoxicillin-pot clavulanate, Amoxicillin, and Clarithromycin   Social History   Tobacco Use   Smoking status: Never    Smokeless tobacco: Never  Vaping Use   Vaping Use: Never used  Substance Use Topics   Alcohol use: No   Drug use: No    Family History: family history includes Arthritis in her mother and sister; Diabetes in her father and sister; Healthy in her daughter, son, and son; Heart attack in her father and mother; Hyperlipidemia in her father; Other in her son.  ROS:   Please see the history of present illness. All other systems are reviewed and negative.    EKGs/Labs/Other Studies Reviewed:    The following studies were reviewed today:  Cardioversion 03/21/2018 ([facility]): IMPRESSION: Successful direct current cardioversion with restoration of  sinus rhythm from atrial fibrillation with no immediate complication.   TTE 02/17/2018: Summary  Normal left ventricular size and systolic function with no appreciable  segmental abnormality.  Ejection fraction is visually estimated at 60-65%.  Mild left ventricular hypertrophy.  Mild tricuspid regurgitation.  Mild mitral regurgitation.  Normal size left atrium.   ABI, LE Arterial and Venous Dopplers of 2019.  CTA Abdominal Aorta and Bilateral Iliofemoral Runoff ([facility]) 11/28/2016: VASCULAR:  - Focal occlusion of the right common femoral artery over 2.5 cm, with apparent collateral reconstitution distally.  - No abdominal aortic aneurysm, dissection, or significant plaque is identified.   IMPRESSION:  Right common femoral artery occlusion, appears embolic.   EKG:  EKG is personally reviewed. 12/29/21: not ordered today 09/24/2021:  atrial fibrillation at 72 bpm   06/11/2021: atrial fibrillation at 60 bpm  Recent Labs: 10/23/2021: ALT 16; BUN 21; Creatinine, Ser 1.08; Hemoglobin 14.1; Platelets 140.0; Potassium 4.9; Sodium 138; TSH 0.42   Recent Lipid Panel    Component Value Date/Time   CHOL 176 10/23/2021 1347   TRIG 106.0 10/23/2021 1347   HDL 71.50 10/23/2021 1347   CHOLHDL 2 10/23/2021 1347   VLDL 21.2 10/23/2021  1347   LDLCALC 84 10/23/2021 1347   LDLCALC 72 01/24/2017 1621    Physical Exam:    VS:  BP 130/68   Pulse 79   Ht 5' 1.2" (1.554 m)   Wt 156 lb 6.4 oz (70.9 kg)   SpO2 96%   BMI 29.36 kg/m     Wt Readings from Last 3 Encounters:  12/29/21 156 lb 6.4 oz (70.9 kg)  11/06/21 159 lb 12.8 oz (72.5 kg)  10/23/21 158 lb (71.7 kg)    GEN: Well nourished, well developed in no acute distress HEENT: Normal, moist mucous membranes NECK: No JVD CARDIAC: irregularly irregular rhythm, normal S1 and S2, no rubs or gallops. No murmur. VASCULAR: Radial and DP pulses 2+ bilaterally. No carotid bruits RESPIRATORY:  Clear to auscultation without rales, wheezing or rhonchi  ABDOMEN: Soft, non-tender, non-distended MUSCULOSKELETAL:  Ambulates independently SKIN: Warm and dry, very trivial bilateral LE edema NEUROLOGIC:  Alert and oriented x 3. No focal neuro deficits noted. PSYCHIATRIC:  Normal affect    ASSESSMENT:    1. Permanent atrial fibrillation ([place])   2. History of arterial embolism   3. History of embolectomy   4. Secondary hypercoagulable state ([place])  5. Long term current use of anticoagulant   6. Essential hypertension   7. Combined hyperlipidemia associated with type 2 diabetes mellitus (HCC)     PLAN:    Permanent atrial fibrillation -CHA2DS2-VASc Score = 9 [CHF History: 1, HTN History: 1, Diabetes History: 1, Stroke History: 2 (thromboembolism), Vascular Disease History: 1, Age Score: 2, Gender Score: 1].  Therefore, the patient's annual risk of stroke is 12.2 %.    Also with prior embolic occlusion of femoral artery, but this was prior to diagnosis of afib/anticoagulation -continue apixaban 5 mg BID -continue atenolol, diltiazem  Hypertension -at goal today -stressed salt avoidance. She is working on activity -continue atenolol, diltiazem, lisinoprol-HCTZ  Type II diabetes Hyperlipidemia -continue rosuvastatin -no aspirin as she is on apixaban -currently on  metformin. Consider SGLT2i if she has issues with diastolic heart failure, though today she appears euvolemic  Cardiac risk counseling and prevention recommendations: -recommend heart healthy/Mediterranean diet, with whole grains, fruits, vegetable, fish, lean meats, nuts, and olive oil. Limit salt. -recommend moderate walking, 3-5 times/week for 30-50 minutes each session. Aim for at least 150 minutes.week. Goal should be pace of 3 miles/hours, or walking 1.5 miles in 30 minutes -recommend avoidance of tobacco products. Avoid excess alcohol.  Plan for follow up: 3 months or sooner as needed, per patient preference  Buford Dresser, MD, PhD, [facility]    Medication Adjustments/Labs and Tests Ordered: Current medicines are reviewed at length with the patient today.  Concerns regarding medicines are outlined above.   No orders of the defined types were placed in this encounter.  No orders of the defined types were placed in this encounter.  Patient Instructions  Medication Instructions:  The current medical regimen is effective;  continue present plan and medications.  *If you need a refill on your cardiac medications before your next appointment, please call your pharmacy*  Follow-Up: At [facility], you and your health needs are our priority.  As part of our continuing mission to provide you with exceptional heart care, we have created designated Provider Care Teams.  These Care Teams include your primary Cardiologist (physician) and Advanced Practice Providers (APPs -  Physician Assistants and Nurse Practitioners) who all work together to provide you with the care you need, when you need it.  We recommend signing up for the patient portal called "MyChart".  Sign up information is provided on this After Visit Summary.  MyChart is used to connect with patients for Virtual Visits (Telemedicine).  Patients are able to view lab/test results, encounter  notes, upcoming appointments, etc.  Non-urgent messages can be sent to your provider as well.   To learn more about what you can do with MyChart, go to [email or web address].    Your next appointment:   3 month(s)  The format for your next appointment:   In Person  Provider:   Buford Dresser, MD           Signed, Buford Dresser, MD PhD 12/30/2021 9:52 AM    [place]

## 2021-12-29 NOTE — Patient Instructions (Signed)
Medication Instructions:  The current medical regimen is effective;  continue present plan and medications.  *If you need a refill on your cardiac medications before your next appointment, please call your pharmacy*  Follow-Up: At [facility], you and your health needs are our priority.  As part of our continuing mission to provide you with exceptional heart care, we have created designated Provider Care Teams.  These Care Teams include your primary Cardiologist (physician) and Advanced Practice Providers (APPs -  Physician Assistants and Nurse Practitioners) who all work together to provide you with the care you need, when you need it.  We recommend signing up for the patient portal called "MyChart".  Sign up information is provided on this After Visit Summary.  MyChart is used to connect with patients for Virtual Visits (Telemedicine).  Patients are able to view lab/test results, encounter notes, upcoming appointments, etc.  Non-urgent messages can be sent to your provider as well.   To learn more about what you can do with MyChart, go to [email or web address].    Your next appointment:   3 month(s)  The format for your next appointment:   In Person  Provider:   Buford Dresser, MD

## 2022-01-18 ENCOUNTER — Encounter: Payer: Self-pay | Admitting: Family Medicine

## 2022-01-20 ENCOUNTER — Ambulatory Visit (INDEPENDENT_AMBULATORY_CARE_PROVIDER_SITE_OTHER): Payer: Medicare Other | Admitting: Family

## 2022-01-20 ENCOUNTER — Encounter: Payer: Self-pay | Admitting: Family

## 2022-01-20 VITALS — BP 146/74 | HR 72 | Temp 98.0°F | Ht 61.2 in | Wt 153.0 lb

## 2022-01-20 DIAGNOSIS — J3489 Other specified disorders of nose and nasal sinuses: Secondary | ICD-10-CM

## 2022-01-20 DIAGNOSIS — R059 Cough, unspecified: Secondary | ICD-10-CM | POA: Diagnosis not present

## 2022-01-20 LAB — POC COVID19 BINAXNOW: SARS Coronavirus 2 Ag: NEGATIVE

## 2022-01-20 MED ORDER — BENZONATATE 100 MG PO CAPS: 100.0000 mg | ORAL_CAPSULE | Freq: Three times a day (TID) | ORAL | 0 refills | Status: AC | PRN

## 2022-01-20 NOTE — Progress Notes (Signed)
Patient ID: Nicole Jimenez, female    DOB: [birth year], 87 y.o.   MRN: [id number]  Chief Complaint  Patient presents with   Cough    sx for 4d    HPI:      URI sx:   Pt c/o chest congestion and cough and sniffles, Present since this past Friday evening and slept through all Saturday with a fever of 102 on Sunday morning. Has tried Tylenol and felt better rest of the day. Usually when she's lying down. Covid negative last night, but doesn't think she did the test correctly. Denies sore throat, fatigue, or body aches.   Assessment & Plan:  1. Rhinorrhea - advised on trying generic Xyzal as current antihistamine not helping sx. May also need Astelin nasal spray if oral is not enough, discuss with PCP.  2. Cough in adult patient - rapid covid neg. lungs clear, sending Tessalon pearles, advised on use & SE. Increase water intake to 2L, pt mostly drinks tea, advise to avoid caffeinated tea.  Call back if cough is not improving in another week.  - benzonatate (TESSALON) 100 MG capsule; Take 1 capsule (100 mg total) by mouth 3 (three) times daily as needed for up to 10 days for cough.  Dispense: 30 capsule; Refill: 0    Subjective:    Outpatient Medications Prior to Visit  Medication Sig Dispense Refill   Acetaminophen 500 MG coapsule Take 1,000 mg by mouth as needed.      alendronate (FOSAMAX) 70 MG tablet TAKE 1 TABLET BY MOUTH ONCE A WEEK. TAKE WITH A FULL GLASS OF WATER ON AN EMPTY STOMACH. 12 tablet 3   apixaban (ELIQUIS) 5 MG TABS tablet Take 1 tablet (5 mg total) by mouth 2 (two) times daily. 180 tablet 1   atenolol (TENORMIN) 100 MG tablet Take 1 tablet (100 mg total) by mouth daily. 90 tablet 3   Calcium Carb-Cholecalciferol 600-800 MG-UNIT CHEW Chew 1 each by mouth daily.      diclofenac sodium (VOLTAREN) 1 % GEL APPLY 2 G TOPICALLY 4 (FOUR) TIMES DAILY AS NEEDED. **PA DENIED** 200 g 1   diltiazem (CARDIZEM CD) 180 MG 24 hr capsule TAKE 1 CAPSULE (180 MG TOTAL) BY MOUTH EVERY  MORNING. 90 capsule 3   Glucosamine-MSM-Hyaluronic Acd (JOINT HEALTH PO) Take 1 tablet by mouth daily.     glucose blood (ONETOUCH ULTRA) test strip USE TWICE DAILY TO CHECK BLOOD SUGAR - E11.40 100 strip 1   Krill Oil 500 MG CAPS Take 500 mg by mouth daily.     lisinopril-hydrochlorothiazide (ZESTORETIC) 20-25 MG tablet Take 1 tablet by mouth daily. 90 tablet 3   metFORMIN (GLUCOPHAGE-XR) 500 MG 24 hr tablet Take 2 tablets (1,000 mg total) by mouth daily with breakfast. 180 tablet 3   Multiple Vitamins-Minerals (MULTIVITAMIN ADULT PO) Take by mouth.     ONETOUCH ULTRA test strip USE AS INSTRUCTED 100 strip 12   rosuvastatin (CRESTOR) 40 MG tablet Take 1 tablet (40 mg total) by mouth daily. 90 tablet 3   vortioxetine HBr (TRINTELLIX) 10 MG TABS tablet Take 1 tablet (10 mg total) by mouth daily. 90 tablet 3   No facility-administered medications prior to visit.   Past Medical History:  Diagnosis Date   Arthritis    Depression    Diabetes mellitus without complication (HCC)    Borderline   Diastolic CHF ([place]) 06/18/2583   Echo 11/2016, nl EF but LVH and diastolic dysfunction   GERD (gastroesophageal reflux disease)  Hyperlipidemia    Hypertension    Peripheral arterial disease ([place]) 2018   thromboembolism of femoral artery   Thyroid disease    subclinical hyperthyroidism managed by endocrinology   Past Surgical History:  Procedure Laterality Date   FEMORAL ENDARTERECTOMY Right 11/29/2016   Allergies  Allergen Reactions   Amoxicillin-Pot Clavulanate Other (See Comments)   Amoxicillin Other (See Comments)   Clarithromycin Other (See Comments)      Objective:    Physical Exam Vitals and nursing note reviewed.  Constitutional:      Appearance: Normal appearance. She is not ill-appearing.     Interventions: Face mask in place.  HENT:     Right Ear: Tympanic membrane and ear canal normal.     Left Ear: Tympanic membrane and ear canal normal.     Nose:     Right Sinus: No  frontal sinus tenderness.     Left Sinus: No frontal sinus tenderness.     Mouth/Throat:     Mouth: Mucous membranes are moist.     Pharynx: No pharyngeal swelling, oropharyngeal exudate, posterior oropharyngeal erythema or uvula swelling.     Tonsils: No tonsillar exudate or tonsillar abscesses.  Cardiovascular:     Rate and Rhythm: Normal rate and regular rhythm.  Pulmonary:     Effort: Pulmonary effort is normal.     Breath sounds: Normal breath sounds.  Musculoskeletal:        General: Normal range of motion.  Lymphadenopathy:     Head:     Right side of head: No preauricular or posterior auricular adenopathy.     Left side of head: No preauricular or posterior auricular adenopathy.     Cervical: No cervical adenopathy.  Skin:    General: Skin is warm and dry.  Neurological:     Mental Status: She is alert.  Psychiatric:        Mood and Affect: Mood normal.        Behavior: Behavior normal.    BP (!) 146/74 (BP Location: Left Arm, Patient Position: Sitting, Cuff Size: Large)   Pulse 72   Temp 98 F (36.7 C) (Temporal)   Ht 5' 1.2" (1.554 m)   Wt 153 lb (69.4 kg)   SpO2 95%   BMI 28.72 kg/m  Wt Readings from Last 3 Encounters:  01/20/22 153 lb (69.4 kg)  12/29/21 156 lb 6.4 oz (70.9 kg)  11/06/21 159 lb 12.8 oz (72.5 kg)       Jeanie Sewer, NP

## 2022-01-20 NOTE — Patient Instructions (Signed)
It was very nice to see you today!  I have sent over capsules to help your cough. Increase your water intake to 8 cups daily. You can try the over the counter Xyzal instead of the Allerclear to see if it helps any better with your runny nose.  Call back if your symptoms are no better by next week!       PLEASE NOTE:  If you had any lab tests please let us know if you have not heard back within a few days. You may see your results on MyChart before we have a chance to review them but we will give you a call once they are reviewed by [place]. If we ordered any referrals today, please let us know if you have not heard from their office within the next week.

## 2022-01-26 ENCOUNTER — Ambulatory Visit (INDEPENDENT_AMBULATORY_CARE_PROVIDER_SITE_OTHER): Payer: Medicare Other | Admitting: Family Medicine

## 2022-01-26 ENCOUNTER — Encounter: Payer: Self-pay | Admitting: Family Medicine

## 2022-01-26 DIAGNOSIS — E1142 Type 2 diabetes mellitus with diabetic polyneuropathy: Secondary | ICD-10-CM | POA: Diagnosis not present

## 2022-01-26 DIAGNOSIS — F329 Major depressive disorder, single episode, unspecified: Secondary | ICD-10-CM | POA: Diagnosis not present

## 2022-01-26 DIAGNOSIS — S5011XA Contusion of right forearm, initial encounter: Secondary | ICD-10-CM | POA: Diagnosis not present

## 2022-01-26 DIAGNOSIS — I10 Essential (primary) hypertension: Secondary | ICD-10-CM | POA: Diagnosis not present

## 2022-01-26 DIAGNOSIS — S0083XA Contusion of other part of head, initial encounter: Secondary | ICD-10-CM

## 2022-01-26 DIAGNOSIS — S60222A Contusion of left hand, initial encounter: Secondary | ICD-10-CM

## 2022-01-26 LAB — POCT GLYCOSYLATED HEMOGLOBIN (HGB A1C): Hemoglobin A1C: 6.4 % — AB (ref 4.0–5.6)

## 2022-01-26 NOTE — Patient Instructions (Addendum)
Please return in 3 months for diabetes and blood pressure recheck.  May you have a blessed holiday.   If you have any questions or concerns, please don't hesitate to send me a message via MyChart or call the office at [phone]. Thank you for visiting with [place] today! It's our pleasure caring for you.
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
## 2022-01-28 ENCOUNTER — Encounter: Payer: Self-pay | Admitting: Family Medicine

## 2022-02-22 ENCOUNTER — Other Ambulatory Visit: Payer: Self-pay | Admitting: Family Medicine

## 2022-03-09 ENCOUNTER — Encounter: Payer: Self-pay | Admitting: Family Medicine

## 2022-03-30 ENCOUNTER — Encounter (HOSPITAL_BASED_OUTPATIENT_CLINIC_OR_DEPARTMENT_OTHER): Payer: Self-pay | Admitting: Cardiology

## 2022-03-30 ENCOUNTER — Ambulatory Visit (INDEPENDENT_AMBULATORY_CARE_PROVIDER_SITE_OTHER): Payer: Medicare Other | Admitting: Cardiology

## 2022-03-30 VITALS — BP 128/72 | HR 67 | Ht 61.0 in | Wt 147.0 lb

## 2022-03-30 DIAGNOSIS — Z86718 Personal history of other venous thrombosis and embolism: Secondary | ICD-10-CM

## 2022-03-30 DIAGNOSIS — Z7901 Long term (current) use of anticoagulants: Secondary | ICD-10-CM

## 2022-03-30 DIAGNOSIS — I10 Essential (primary) hypertension: Secondary | ICD-10-CM

## 2022-03-30 DIAGNOSIS — Z9889 Other specified postprocedural states: Secondary | ICD-10-CM | POA: Diagnosis not present

## 2022-03-30 DIAGNOSIS — E782 Mixed hyperlipidemia: Secondary | ICD-10-CM

## 2022-03-30 DIAGNOSIS — D6869 Other thrombophilia: Secondary | ICD-10-CM | POA: Diagnosis not present

## 2022-03-30 DIAGNOSIS — E1169 Type 2 diabetes mellitus with other specified complication: Secondary | ICD-10-CM

## 2022-03-30 DIAGNOSIS — I4821 Permanent atrial fibrillation: Secondary | ICD-10-CM | POA: Diagnosis not present

## 2022-03-30 NOTE — Progress Notes (Signed)
Cardiology Office Note:    Date:  03/31/2022   ID:  Nicole Jimenez, DOB 1934/02/29, MRN [id number]  PCP:  Nicole Arnt, MD  Cardiologist:  Nicole Dresser, MD  Referring MD: Nicole Arnt, MD   CC: Follow-up  History of Present Illness:    Nicole Jimenez is a 88 y.o. 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 She was initially seen 06/11/2021 as a new consult at the request of Nicole Arnt, MD for the evaluation and management of permanent atrial fibrillation.  Cardiovascular risk factors: Prior clinical ASCVD:  DC Cardioversion 03/21/2018, she confirms that she was unable to tell if this procedure was successful. She never felt symptoms such as palpitations. She reports having a blood clot previously while she was in [place]. Comorbid conditions: Hypertension, Hyperlipidemia, Diabetes  Prior cardiac testing and/or incidental findings on other testing (ie coronary calcium): She notes that the majority of her providers were at [facility] previously. TTE 02/2018 with normal LV function and mild MR/TR.  At her previous visit she was doing well over all, working on being more active but reported her muscles getting sore. Her chronic bilateral ankle edema had not changed though the compression socks were helping.   Today, she was a little concerned about how low her BP was in office (128/72) as she has never had I be that low before. She reports that she has been dealing with a lot of up and downs lately. She has been dealing with a unknown respiratory infection, but has tested negative for COVID. She is still dealing with some fatigue and back pain from osteoporosis. She has not been exercising the way she should.   She will have some SOB on extreme  exertion but no chest pain or SOB without exertion. Occasionally she will be walking a feels like she is going "sideways" like she is " out of alignment".    She also has noticed that she has been losing weight unintentionally. She does not seem to have the appetite for foods she normally enjoys. When she does prepare them, she will only eat a small portion before she is feeling full. She denies any high fever, but she had a small one when she had the upper respiratory infection. She does have some off and on issues with diarrhea, depending on her diet, but nothing out of the ordinary for her.   Lately she as been going through a period of depression. When she is out and about she is fine, but when she is home she tends to feel a lot of guilt, anxiety and sadness. She seems to be crying a lot without much reason. She was on Trintellix and it helped before, but she has been off of the medications for 3 weeks. She thought it was not helping initially but since she has been off of the medication it has gotten worse than it was before.   She denies any palpitations, chest pain, or peripheral edema. No headaches, syncope, orthopnea, or PND.     Past Medical History:  Diagnosis Date   Arthritis    Depression    Diabetes mellitus without complication (HCC)    Borderline   Diastolic CHF ([place]) [id number]   Echo 11/2016, nl EF but LVH and diastolic dysfunction   GERD (gastroesophageal reflux disease)  Hyperlipidemia    Hypertension    Peripheral arterial disease ([place]) 2018   thromboembolism of femoral artery   Thyroid disease    subclinical hyperthyroidism managed by endocrinology    Past Surgical History:  Procedure Laterality Date   FEMORAL ENDARTERECTOMY Right 11/29/2016    Current Medications: Current Outpatient Medications on File Prior to Visit  Medication Sig   Acetaminophen 500 MG coapsule Take 1,000 mg by mouth as needed.    alendronate (FOSAMAX) 70 MG tablet TAKE 1 TABLET BY  MOUTH ONCE A WEEK. TAKE WITH A FULL GLASS OF WATER ON AN EMPTY STOMACH.   apixaban (ELIQUIS) 5 MG TABS tablet Take 1 tablet (5 mg total) by mouth 2 (two) times daily.   atenolol (TENORMIN) 100 MG tablet Take 1 tablet (100 mg total) by mouth daily.   Calcium Carb-Cholecalciferol 600-800 MG-UNIT CHEW Chew 1 each by mouth daily.    diclofenac sodium (VOLTAREN) 1 % GEL APPLY 2 G TOPICALLY 4 (FOUR) TIMES DAILY AS NEEDED. **PA DENIED**   diltiazem (CARDIZEM CD) 180 MG 24 hr capsule TAKE 1 CAPSULE (180 MG TOTAL) BY MOUTH EVERY MORNING.   Glucosamine-MSM-Hyaluronic Acd (JOINT HEALTH PO) Take 1 tablet by mouth daily.   glucose blood (ONETOUCH ULTRA) test strip USE TWICE DAILY TO CHECK BLOOD SUGAR - E11.40   Krill Oil 500 MG CAPS Take 500 mg by mouth daily.   lisinopril-hydrochlorothiazide (ZESTORETIC) 20-25 MG tablet TAKE 1 TABLET BY MOUTH EVERY DAY   metFORMIN (GLUCOPHAGE-XR) 500 MG 24 hr tablet Take 2 tablets (1,000 mg total) by mouth daily with breakfast.   Multiple Vitamins-Minerals (MULTIVITAMIN ADULT PO) Take by mouth.   ONETOUCH ULTRA test strip USE AS INSTRUCTED   rosuvastatin (CRESTOR) 40 MG tablet Take 1 tablet (40 mg total) by mouth daily.   vortioxetine HBr (TRINTELLIX) 10 MG TABS tablet Take 1 tablet (10 mg total) by mouth daily.   No current facility-administered medications on file prior to visit.     Allergies:   Amoxicillin-pot clavulanate, Amoxicillin, and Clarithromycin   Social History   Tobacco Use   Smoking status: Never   Smokeless tobacco: Never  Vaping Use   Vaping Use: Never used  Substance Use Topics   Alcohol use: No   Drug use: No    Family History: family history includes Arthritis in her mother and sister; Diabetes in her father and sister; Healthy in her daughter, son, and son; Heart attack in her father and mother; Hyperlipidemia in her father; Other in her son.  ROS:   Please see the history of present illness. (+) shortness of breath (exertional)  (+)  loss of appetite.  (+) lightheadedness (positional) (+) depression  All other systems are reviewed and negative.    EKGs/Labs/Other Studies Reviewed:    The following studies were reviewed today:  Cardioversion 03/21/2018 ([facility]): IMPRESSION: Successful direct current cardioversion with restoration of  sinus rhythm from atrial fibrillation with no immediate complication.   TTE 02/17/2018: Summary  Normal left ventricular size and systolic function with no appreciable  segmental abnormality.  Ejection fraction is visually estimated at 60-65%.  Mild left ventricular hypertrophy.  Mild tricuspid regurgitation.  Mild mitral regurgitation.  Normal size left atrium.   ABI, LE Arterial and Venous Dopplers of 2019.  CTA Abdominal Aorta and Bilateral Iliofemoral Runoff ([facility]) 11/28/2016: VASCULAR:  - Focal occlusion of the right common femoral artery over 2.5 cm, with apparent collateral reconstitution distally.  - No abdominal aortic aneurysm, dissection, or significant plaque is  identified.   IMPRESSION:  Right common femoral artery occlusion, appears embolic.   EKG:  EKG is personally reviewed. 03/30/2022: not ordered today 12/29/21: not ordered today 09/24/2021:  atrial fibrillation at 72 bpm   06/11/2021: atrial fibrillation at 60 bpm  Recent Labs: 10/23/2021: ALT 16; BUN 21; Creatinine, Ser 1.08; Hemoglobin 14.1; Platelets 140.0; Potassium 4.9; Sodium 138; TSH 0.42   Recent Lipid Panel    Component Value Date/Time   CHOL 176 10/23/2021 1347   TRIG 106.0 10/23/2021 1347   HDL 71.50 10/23/2021 1347   CHOLHDL 2 10/23/2021 1347   VLDL 21.2 10/23/2021 1347   LDLCALC 84 10/23/2021 1347   LDLCALC 72 01/24/2017 1621    Physical Exam:    VS:  BP 128/72 (BP Location: Left Arm, Patient Position: Sitting, Cuff Size: Normal)   Pulse 67   Ht 5' 1"$  (1.549 m)   Wt 147 lb (66.7 kg)   SpO2 94%   BMI 27.78 kg/m     Wt Readings from Last 3 Encounters:  03/30/22 147  lb (66.7 kg)  01/26/22 154 lb 12.8 oz (70.2 kg)  01/20/22 153 lb (69.4 kg)    GEN: Well nourished, well developed in no acute distress HEENT: Normal, moist mucous membranes NECK: No JVD CARDIAC: irregularly irregular rhythm, normal S1 and S2, no rubs or gallops. No murmur. VASCULAR: Radial and DP pulses 2+ bilaterally. No carotid bruits RESPIRATORY:  Clear to auscultation without rales, wheezing or rhonchi  ABDOMEN: Soft, non-tender, non-distended MUSCULOSKELETAL:  Ambulates independently SKIN: Warm and dry, no significant LE edema NEUROLOGIC:  Alert and oriented x 3. No focal neuro deficits noted. PSYCHIATRIC:  Normal affect    ASSESSMENT:    1. Permanent atrial fibrillation ([place])   2. History of arterial embolism   3. History of embolectomy   4. Secondary hypercoagulable state ([place])   5. Long term current use of anticoagulant   6. Essential hypertension   7. Combined hyperlipidemia associated with type 2 diabetes mellitus (HCC)      PLAN:    Permanent atrial fibrillation -CHA2DS2-VASc Score = 9 [CHF History: 1, HTN History: 1, Diabetes History: 1, Stroke History: 2 (thromboembolism), Vascular Disease History: 1, Age Score: 2, Gender Score: 1].  Therefore, the patient's annual risk of stroke is 12.2 %.    Also with prior embolic occlusion of femoral artery, but this was prior to diagnosis of afib/anticoagulation -continue apixaban 5 mg BID -continue atenolol, diltiazem  Hypertension -at goal today -stressed salt avoidance. She is working on activity -continue atenolol, diltiazem, lisinoprol-HCTZ  Type II diabetes Hyperlipidemia -continue rosuvastatin -no aspirin as she is on apixaban -currently on metformin. Consider SGLT2i if she has issues with diastolic heart failure, though today she appears euvolemic  Cardiac risk counseling and prevention recommendations: -recommend heart healthy/Mediterranean diet, with whole grains, fruits, vegetable, fish, lean meats, nuts,  and olive oil. Limit salt. -recommend moderate walking, 3-5 times/week for 30-50 minutes each session. Aim for at least 150 minutes.week. Goal should be pace of 3 miles/hours, or walking 1.5 miles in 30 minutes -recommend avoidance of tobacco products. Avoid excess alcohol.  Plan for follow up: 3 months  Nicole Dresser, MD, PhD, [facility]    Medication Adjustments/Labs and Tests Ordered: Current medicines are reviewed at length with the patient today.  Concerns regarding medicines are outlined above.   No orders of the defined types were placed in this encounter.  No orders of the defined types were placed in this encounter.  Patient Instructions  Medication Instructions:  The current medical regimen is effective;  continue present plan and medications.   *If you need a refill on your cardiac medications before your next appointment, please call your pharmacy*   Lab Work: None   Testing/Procedures: None   Follow-Up: At [facility], you and your health needs are our priority.  As part of our continuing mission to provide you with exceptional heart care, we have created designated Provider Care Teams.  These Care Teams include your primary Cardiologist (physician) and Advanced Practice Providers (APPs -  Physician Assistants and Nurse Practitioners) who all work together to provide you with the care you need, when you need it.  We recommend signing up for the patient portal called "MyChart".  Sign up information is provided on this After Visit Summary.  MyChart is used to connect with patients for Virtual Visits (Telemedicine).  Patients are able to view lab/test results, encounter notes, upcoming appointments, etc.  Non-urgent messages can be sent to your provider as well.   To learn more about what you can do with MyChart, go to [email or web address].    Your next appointment:   3 month(s)  Provider:   Buford Dresser, MD     Other Instructions None     Burnett Kanaris Ford,acting as a scribe for Nicole Dresser, MD.,have documented all relevant documentation on the behalf of Nicole Dresser, MD,as directed by  Nicole Dresser, MD while in the presence of Nicole Dresser, MD.   I, Nicole Dresser, MD, have reviewed all documentation for this visit. The documentation on 03/31/22 for the exam, diagnosis, procedures, and orders are all accurate and complete.   Signed, Nicole Dresser, MD PhD 03/31/2022 8:40 AM    [place]

## 2022-03-30 NOTE — Patient Instructions (Signed)
Medication Instructions:  The current medical regimen is effective;  continue present plan and medications.   *If you need a refill on your cardiac medications before your next appointment, please call your pharmacy*   Lab Work: None   Testing/Procedures: None   Follow-Up: At [facility], you and your health needs are our priority.  As part of our continuing mission to provide you with exceptional heart care, we have created designated Provider Care Teams.  These Care Teams include your primary Cardiologist (physician) and Advanced Practice Providers (APPs -  Physician Assistants and Nurse Practitioners) who all work together to provide you with the care you need, when you need it.  We recommend signing up for the patient portal called "MyChart".  Sign up information is provided on this After Visit Summary.  MyChart is used to connect with patients for Virtual Visits (Telemedicine).  Patients are able to view lab/test results, encounter notes, upcoming appointments, etc.  Non-urgent messages can be sent to your provider as well.   To learn more about what you can do with MyChart, go to [email or web address].    Your next appointment:   3 month(s)  Provider:   Buford Dresser, MD    Other Instructions None

## 2022-03-31 ENCOUNTER — Encounter (HOSPITAL_BASED_OUTPATIENT_CLINIC_OR_DEPARTMENT_OTHER): Payer: Self-pay | Admitting: Cardiology

## 2022-04-02 ENCOUNTER — Encounter: Payer: Self-pay | Admitting: Family Medicine

## 2022-04-05 ENCOUNTER — Ambulatory Visit (INDEPENDENT_AMBULATORY_CARE_PROVIDER_SITE_OTHER): Payer: Medicare Other | Admitting: Family Medicine

## 2022-04-05 VITALS — BP 102/60 | HR 56 | Temp 97.8°F | Ht 61.0 in | Wt 142.8 lb

## 2022-04-05 DIAGNOSIS — R1010 Upper abdominal pain, unspecified: Secondary | ICD-10-CM

## 2022-04-05 DIAGNOSIS — R1013 Epigastric pain: Secondary | ICD-10-CM

## 2022-04-05 DIAGNOSIS — I952 Hypotension due to drugs: Secondary | ICD-10-CM | POA: Diagnosis not present

## 2022-04-05 DIAGNOSIS — R634 Abnormal weight loss: Secondary | ICD-10-CM

## 2022-04-05 DIAGNOSIS — R42 Dizziness and giddiness: Secondary | ICD-10-CM | POA: Diagnosis not present

## 2022-04-05 MED ORDER — LISINOPRIL-HYDROCHLOROTHIAZIDE 10-12.5 MG PO TABS: 1.0000 | ORAL_TABLET | Freq: Every day | ORAL | 3 refills | Status: DC

## 2022-04-05 MED ORDER — OMEPRAZOLE 20 MG PO CPDR: 20.0000 mg | DELAYED_RELEASE_CAPSULE | Freq: Every day | ORAL | 1 refills | Status: DC

## 2022-04-05 MED ORDER — ATENOLOL 50 MG PO TABS: 50.0000 mg | ORAL_TABLET | Freq: Every day | ORAL | 3 refills | Status: DC
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
## 2022-04-05 NOTE — Patient Instructions (Signed)
Please follow up as scheduled for your next visit with me: 04/12/2022   Please hold your alendronate (fosamax). Please start omeprazole '20mg'$  daily.  I have lowered the dose of your atenolol to '50mg'$  daily and lisinopril-hctz to 10/12.5 daily. You may pick up your new prescriptions at the pharmacy.  I will recheck your blood pressure at your next visit next week.   I will release your lab results to you on your MyChart account with further instructions. You may see the results before I do, but when I review them I will send you a message with my report or have my assistant call you if things need to be discussed. Please reply to my message with any questions. Thank you!   If you have any questions or concerns, please don't hesitate to send me a message via MyChart or call the office at [phone]. Thank you for visiting with [place] today! It's our pleasure caring for you.

## 2022-04-06 ENCOUNTER — Encounter: Payer: Self-pay | Admitting: Family Medicine

## 2022-04-06 LAB — COMPREHENSIVE METABOLIC PANEL
ALT: 28 U/L (ref 0–35)
AST: 42 U/L — ABNORMAL HIGH (ref 0–37)
Albumin: 4.1 g/dL (ref 3.5–5.2)
Alkaline Phosphatase: 63 U/L (ref 39–117)
BUN: 41 mg/dL — ABNORMAL HIGH (ref 6–23)
CO2: 31 mEq/L (ref 19–32)
Calcium: 11 mg/dL — ABNORMAL HIGH (ref 8.4–10.5)
Chloride: 97 mEq/L (ref 96–112)
Creatinine, Ser: 2.69 mg/dL — ABNORMAL HIGH (ref 0.40–1.20)
GFR: 15.69 mL/min — ABNORMAL LOW (ref >60.00)
Glucose, Bld: 111 mg/dL — ABNORMAL HIGH (ref 70–99)
Potassium: 4.5 mEq/L (ref 3.5–5.1)
Sodium: 143 mEq/L (ref 135–145)
Total Bilirubin: 0.8 mg/dL (ref 0.2–1.2)
Total Protein: 8.4 g/dL — ABNORMAL HIGH (ref 6.0–8.3)

## 2022-04-06 LAB — CBC WITH DIFFERENTIAL/PLATELET
Basophils Absolute: 0.1 10*3/uL (ref 0.0–0.1)
Basophils Relative: 0.6 % (ref 0.0–3.0)
Eosinophils Absolute: 0.2 10*3/uL (ref 0.0–0.7)
Eosinophils Relative: 2.8 % (ref 0.0–5.0)
HCT: 41.4 % (ref 36.0–46.0)
Hemoglobin: 14 g/dL (ref 12.0–15.0)
Lymphocytes Relative: 27.8 % (ref 12.0–46.0)
Lymphs Abs: 2.3 10*3/uL (ref 0.7–4.0)
MCHC: 33.9 g/dL (ref 30.0–36.0)
MCV: 90.1 fl (ref 78.0–100.0)
Monocytes Absolute: 0.9 10*3/uL (ref 0.1–1.0)
Monocytes Relative: 11.4 % (ref 3.0–12.0)
Neutro Abs: 4.7 10*3/uL (ref 1.4–7.7)
Neutrophils Relative %: 57.4 % (ref 43.0–77.0)
Platelets: 167 10*3/uL (ref 150.0–400.0)
RBC: 4.6 Mil/uL (ref 3.87–5.11)
RDW: 13.5 % (ref 11.5–15.5)
WBC: 8.3 10*3/uL (ref 4.0–10.5)

## 2022-04-06 LAB — LIPASE: Lipase: 67 U/L — ABNORMAL HIGH (ref 11.0–59.0)

## 2022-04-06 LAB — TSH: TSH: 0.9 u[IU]/mL (ref 0.35–5.50)

## 2022-04-07 ENCOUNTER — Ambulatory Visit (HOSPITAL_COMMUNITY)
Admission: RE | Admit: 2022-04-07 | Discharge: 2022-04-07 | Disposition: A | Discharge status: Home / Self Care | Payer: Medicare Other | Source: Ambulatory Visit | Attending: Family Medicine | Admitting: Family Medicine

## 2022-04-07 DIAGNOSIS — R1010 Upper abdominal pain, unspecified: Secondary | ICD-10-CM | POA: Insufficient documentation

## 2022-04-07 DIAGNOSIS — R1013 Epigastric pain: Secondary | ICD-10-CM | POA: Insufficient documentation

## 2022-04-07 DIAGNOSIS — N179 Acute kidney failure, unspecified: Secondary | ICD-10-CM | POA: Diagnosis not present

## 2022-04-07 NOTE — Addendum Note (Signed)
Addended by: Billey Chang on: 04/07/2022 10:22 AM   Modules accepted: Orders

## 2022-04-07 NOTE — Progress Notes (Signed)
Please call patient: I have reviewed his/her lab results. Please let her know her ultrasound is normal.  Therefore, continue with omeprazole daily and drinking plenty of fluids. I will recheck her labs next week.  To ER for lightheadedness, chest pain, abdominal pain. Thanks!

## 2022-04-07 NOTE — Progress Notes (Signed)
Please call patient: I have reviewed his/her lab results. Lab results show that she has inflammation of the pancreas and her kidney function is off/ she is likely dehydrated as well.  I have ordered an abdominal ultrasound asap to look at kidneys and pancrease and liver.  We will call you with an appointment.  For now, I recommend a bland diet of clear broth, apple, rice, bread, banana, pudding etc. IF has pain or can't tolerate it, needs to return to office for recheck. IF having worsening pain or symptoms, then report to ER.  We have a follow up appointment next week she needs to keep.  Avoid advil, ibuprofen and motrin.

## 2022-04-12 ENCOUNTER — Telehealth: Payer: Self-pay

## 2022-04-12 ENCOUNTER — Ambulatory Visit: Payer: Medicare Other | Admitting: Family Medicine

## 2022-04-12 ENCOUNTER — Inpatient Hospital Stay (HOSPITAL_COMMUNITY)
Admit: 2022-04-12 | Discharge: 2022-04-20 | DRG: 682 | Disposition: A | Discharge status: Home / Self Care | Payer: Medicare Other | Source: Ambulatory Visit | Attending: Internal Medicine | Admitting: Internal Medicine

## 2022-04-12 ENCOUNTER — Encounter (HOSPITAL_COMMUNITY): Payer: Self-pay

## 2022-04-12 ENCOUNTER — Encounter: Payer: Self-pay | Admitting: Family Medicine

## 2022-04-12 ENCOUNTER — Ambulatory Visit (INDEPENDENT_AMBULATORY_CARE_PROVIDER_SITE_OTHER): Payer: Medicare Other | Admitting: Family Medicine

## 2022-04-12 VITALS — BP 110/60 | HR 80 | Temp 97.8°F | Ht 61.0 in | Wt 140.2 lb

## 2022-04-12 DIAGNOSIS — M1990 Unspecified osteoarthritis, unspecified site: Secondary | ICD-10-CM | POA: Diagnosis present

## 2022-04-12 DIAGNOSIS — K529 Noninfective gastroenteritis and colitis, unspecified: Secondary | ICD-10-CM | POA: Diagnosis present

## 2022-04-12 DIAGNOSIS — N3289 Other specified disorders of bladder: Secondary | ICD-10-CM | POA: Diagnosis not present

## 2022-04-12 DIAGNOSIS — R1013 Epigastric pain: Secondary | ICD-10-CM | POA: Diagnosis not present

## 2022-04-12 DIAGNOSIS — I5033 Acute on chronic diastolic (congestive) heart failure: Secondary | ICD-10-CM | POA: Diagnosis present

## 2022-04-12 DIAGNOSIS — I10 Essential (primary) hypertension: Secondary | ICD-10-CM | POA: Diagnosis present

## 2022-04-12 DIAGNOSIS — E782 Mixed hyperlipidemia: Secondary | ICD-10-CM | POA: Diagnosis not present

## 2022-04-12 DIAGNOSIS — K219 Gastro-esophageal reflux disease without esophagitis: Secondary | ICD-10-CM | POA: Diagnosis present

## 2022-04-12 DIAGNOSIS — N179 Acute kidney failure, unspecified: Secondary | ICD-10-CM | POA: Diagnosis present

## 2022-04-12 DIAGNOSIS — K859 Acute pancreatitis without necrosis or infection, unspecified: Secondary | ICD-10-CM

## 2022-04-12 DIAGNOSIS — E8779 Other fluid overload: Secondary | ICD-10-CM | POA: Diagnosis not present

## 2022-04-12 DIAGNOSIS — E1142 Type 2 diabetes mellitus with diabetic polyneuropathy: Secondary | ICD-10-CM | POA: Diagnosis present

## 2022-04-12 DIAGNOSIS — Z8261 Family history of arthritis: Secondary | ICD-10-CM

## 2022-04-12 DIAGNOSIS — M3500 Sicca syndrome, unspecified: Secondary | ICD-10-CM | POA: Diagnosis present

## 2022-04-12 DIAGNOSIS — T43206A Underdosing of unspecified antidepressants, initial encounter: Secondary | ICD-10-CM | POA: Diagnosis present

## 2022-04-12 DIAGNOSIS — Z7984 Long term (current) use of oral hypoglycemic drugs: Secondary | ICD-10-CM

## 2022-04-12 DIAGNOSIS — Z833 Family history of diabetes mellitus: Secondary | ICD-10-CM

## 2022-04-12 DIAGNOSIS — Z7901 Long term (current) use of anticoagulants: Secondary | ICD-10-CM

## 2022-04-12 DIAGNOSIS — E1151 Type 2 diabetes mellitus with diabetic peripheral angiopathy without gangrene: Secondary | ICD-10-CM | POA: Diagnosis present

## 2022-04-12 DIAGNOSIS — E1169 Type 2 diabetes mellitus with other specified complication: Secondary | ICD-10-CM | POA: Diagnosis present

## 2022-04-12 DIAGNOSIS — Z83438 Family history of other disorder of lipoprotein metabolism and other lipidemia: Secondary | ICD-10-CM

## 2022-04-12 DIAGNOSIS — N170 Acute kidney failure with tubular necrosis: Secondary | ICD-10-CM | POA: Diagnosis present

## 2022-04-12 DIAGNOSIS — E876 Hypokalemia: Secondary | ICD-10-CM | POA: Diagnosis present

## 2022-04-12 DIAGNOSIS — D649 Anemia, unspecified: Secondary | ICD-10-CM | POA: Diagnosis present

## 2022-04-12 DIAGNOSIS — Z8249 Family history of ischemic heart disease and other diseases of the circulatory system: Secondary | ICD-10-CM | POA: Diagnosis not present

## 2022-04-12 DIAGNOSIS — R531 Weakness: Secondary | ICD-10-CM | POA: Diagnosis present

## 2022-04-12 DIAGNOSIS — I5030 Unspecified diastolic (congestive) heart failure: Secondary | ICD-10-CM

## 2022-04-12 DIAGNOSIS — I110 Hypertensive heart disease with heart failure: Secondary | ICD-10-CM | POA: Diagnosis present

## 2022-04-12 DIAGNOSIS — F329 Major depressive disorder, single episode, unspecified: Secondary | ICD-10-CM | POA: Diagnosis not present

## 2022-04-12 DIAGNOSIS — Z79899 Other long term (current) drug therapy: Secondary | ICD-10-CM

## 2022-04-12 DIAGNOSIS — I4821 Permanent atrial fibrillation: Secondary | ICD-10-CM | POA: Diagnosis not present

## 2022-04-12 DIAGNOSIS — Z6828 Body mass index (BMI) 28.0-28.9, adult: Secondary | ICD-10-CM

## 2022-04-12 DIAGNOSIS — F339 Major depressive disorder, recurrent, unspecified: Secondary | ICD-10-CM | POA: Diagnosis present

## 2022-04-12 DIAGNOSIS — I5032 Chronic diastolic (congestive) heart failure: Secondary | ICD-10-CM | POA: Diagnosis not present

## 2022-04-12 DIAGNOSIS — R634 Abnormal weight loss: Secondary | ICD-10-CM | POA: Diagnosis present

## 2022-04-12 DIAGNOSIS — N281 Cyst of kidney, acquired: Secondary | ICD-10-CM | POA: Diagnosis not present

## 2022-04-12 DIAGNOSIS — Z91138 Patient's unintentional underdosing of medication regimen for other reason: Secondary | ICD-10-CM

## 2022-04-12 DIAGNOSIS — I952 Hypotension due to drugs: Secondary | ICD-10-CM

## 2022-04-12 DIAGNOSIS — R748 Abnormal levels of other serum enzymes: Secondary | ICD-10-CM | POA: Diagnosis present

## 2022-04-12 DIAGNOSIS — Z881 Allergy status to other antibiotic agents status: Secondary | ICD-10-CM

## 2022-04-12 DIAGNOSIS — Z880 Allergy status to penicillin: Secondary | ICD-10-CM

## 2022-04-12 LAB — COMPREHENSIVE METABOLIC PANEL
ALT: 18 U/L (ref 0–35)
AST: 26 U/L (ref 0–37)
Albumin: 3.8 g/dL (ref 3.5–5.2)
Alkaline Phosphatase: 64 U/L (ref 39–117)
BUN: 51 mg/dL — ABNORMAL HIGH (ref 6–23)
CO2: 31 mEq/L (ref 19–32)
Calcium: 9.9 mg/dL (ref 8.4–10.5)
Chloride: 94 mEq/L — ABNORMAL LOW (ref 96–112)
Creatinine, Ser: 3.87 mg/dL — ABNORMAL HIGH (ref 0.40–1.20)
GFR: 10.14 mL/min — CL (ref >60.00)
Glucose, Bld: 155 mg/dL — ABNORMAL HIGH (ref 70–99)
Potassium: 4.2 mEq/L (ref 3.5–5.1)
Sodium: 137 mEq/L (ref 135–145)
Total Bilirubin: 1 mg/dL (ref 0.2–1.2)
Total Protein: 7.5 g/dL (ref 6.0–8.3)

## 2022-04-12 LAB — LIPASE: Lipase: 80 U/L — ABNORMAL HIGH (ref 11.0–59.0)

## 2022-04-12 LAB — POCT GLYCOSYLATED HEMOGLOBIN (HGB A1C): Hemoglobin A1C: 6.5 % — AB (ref 4.0–5.6)

## 2022-04-12 LAB — CBC WITH DIFFERENTIAL/PLATELET
Basophils Absolute: 0.1 10*3/uL (ref 0.0–0.1)
Basophils Relative: 0.9 % (ref 0.0–3.0)
Eosinophils Absolute: 0.2 10*3/uL (ref 0.0–0.7)
Eosinophils Relative: 2.2 % (ref 0.0–5.0)
HCT: 39.3 % (ref 36.0–46.0)
Hemoglobin: 13.3 g/dL (ref 12.0–15.0)
Lymphocytes Relative: 21.7 % (ref 12.0–46.0)
Lymphs Abs: 1.5 10*3/uL (ref 0.7–4.0)
MCHC: 33.8 g/dL (ref 30.0–36.0)
MCV: 89.6 fl (ref 78.0–100.0)
Monocytes Absolute: 0.9 10*3/uL (ref 0.1–1.0)
Monocytes Relative: 12.5 % — ABNORMAL HIGH (ref 3.0–12.0)
Neutro Abs: 4.3 10*3/uL (ref 1.4–7.7)
Neutrophils Relative %: 62.7 % (ref 43.0–77.0)
Platelets: 149 10*3/uL — ABNORMAL LOW (ref 150.0–400.0)
RBC: 4.39 Mil/uL (ref 3.87–5.11)
RDW: 13.9 % (ref 11.5–15.5)
WBC: 6.8 10*3/uL (ref 4.0–10.5)

## 2022-04-12 LAB — GLUCOSE, CAPILLARY: Glucose-Capillary: 147 mg/dL — ABNORMAL HIGH (ref 70–99)

## 2022-04-12 MED ORDER — SODIUM CHLORIDE 0.9 % IV SOLN: INTRAVENOUS | Status: AC

## 2022-04-12 MED ORDER — ROSUVASTATIN CALCIUM 20 MG PO TABS
40.0000 mg | ORAL_TABLET | Freq: Every day | ORAL | Status: DC
  Administered 2022-04-14 – 2022-04-15 (×2): 40 mg via ORAL
  Filled 2022-04-12 (×3): qty 2

## 2022-04-12 MED ORDER — APIXABAN 2.5 MG PO TABS
2.5000 mg | ORAL_TABLET | Freq: Two times a day (BID) | ORAL | Status: DC
  Administered 2022-04-12 – 2022-04-20 (×16): 2.5 mg via ORAL
  Filled 2022-04-12 (×16): qty 1

## 2022-04-12 MED ORDER — ACETAMINOPHEN 325 MG PO TABS: 650.0000 mg | ORAL_TABLET | Freq: Four times a day (QID) | ORAL | Status: DC | PRN

## 2022-04-12 MED ORDER — ACETAMINOPHEN 650 MG RE SUPP: 650.0000 mg | Freq: Four times a day (QID) | RECTAL | Status: DC | PRN

## 2022-04-12 MED ORDER — SODIUM CHLORIDE 0.9 % BOLUS PEDS
1000.0000 mL | Freq: Once | INTRAVENOUS | Status: AC
  Administered 2022-04-12: 1000 mL via INTRAVENOUS

## 2022-04-12 MED ORDER — POLYETHYLENE GLYCOL 3350 17 G PO PACK: 17.0000 g | PACK | Freq: Every day | ORAL | Status: DC | PRN

## 2022-04-12 MED ORDER — SODIUM CHLORIDE 0.9% FLUSH
3.0000 mL | Freq: Two times a day (BID) | INTRAVENOUS | Status: DC
  Administered 2022-04-13 – 2022-04-20 (×14): 3 mL via INTRAVENOUS

## 2022-04-12 MED ORDER — INSULIN ASPART 100 UNIT/ML IJ SOLN
0.0000 [IU] | Freq: Three times a day (TID) | INTRAMUSCULAR | Status: DC
  Administered 2022-04-13 – 2022-04-14 (×4): 1 [IU] via SUBCUTANEOUS
  Administered 2022-04-15: 2 [IU] via SUBCUTANEOUS
  Administered 2022-04-15 (×2): 1 [IU] via SUBCUTANEOUS
  Administered 2022-04-16: 3 [IU] via SUBCUTANEOUS
  Administered 2022-04-16: 1 [IU] via SUBCUTANEOUS
  Administered 2022-04-16 – 2022-04-17 (×3): 2 [IU] via SUBCUTANEOUS
  Administered 2022-04-18: 3 [IU] via SUBCUTANEOUS
  Administered 2022-04-19: 1 [IU] via SUBCUTANEOUS
  Administered 2022-04-19: 3 [IU] via SUBCUTANEOUS
  Administered 2022-04-19 – 2022-04-20 (×2): 1 [IU] via SUBCUTANEOUS

## 2022-04-12 MED ORDER — DILTIAZEM HCL ER COATED BEADS 180 MG PO CP24
180.0000 mg | ORAL_CAPSULE | Freq: Every morning | ORAL | Status: DC
  Administered 2022-04-13 – 2022-04-20 (×8): 180 mg via ORAL
  Filled 2022-04-12 (×8): qty 1

## 2022-04-12 MED ORDER — PANTOPRAZOLE SODIUM 40 MG PO TBEC
40.0000 mg | DELAYED_RELEASE_TABLET | Freq: Every day | ORAL | Status: DC
  Administered 2022-04-13 – 2022-04-19 (×7): 40 mg via ORAL
  Filled 2022-04-12 (×7): qty 1

## 2022-04-12 NOTE — Progress Notes (Signed)
Subjective  CC:  Chief Complaint  Patient presents with   Follow-up    HPI: Nicole Jimenez is a 88 y.o. female who presents to the office today to address the problems listed above in the chief complaint. See last note.  88 year old who is not eating very well and felt lightheaded without true abdominal pain just more lack of an appetite returns.  Lab work revealed mild acute pancreatitis with acute kidney injury likely from dehydration or prerenal.  She was hypotensive on exam.  We adjusted medications.  She has since had an ultrasound which showed normal biliary tract, partially visible pancreas, normal kidneys.  She is feeling okay today.  She is a vague historian.  Appetite remains low.  She says she feels hungry but then does not really have a taste for things.  Trying to keep hydrated.  No chest pain, shortness of breath, fevers, melena.  She is on omeprazole.  This may be helping her symptoms.  Assessment   1. Acute pancreatitis without infection or necrosis, unspecified pancreatitis type   2. Type 2 diabetes mellitus with peripheral neuropathy (HCC)   3. Hypotension due to drugs   4. Dyspepsia   5. AKI (acute kidney injury) ([place])      Plan  Pancreatitis: Possibly due to medications.  Will hold hydrochlorothiazide and rosuvastatin.  Hold metformin given vague GI complaints.  Recheck lipase today.  No red flag symptoms.  Abdominal ultrasound was unremarkable Acute kidney injury: Most likely due to prerenal etiology.  Will hold lisinopril HCTZ, continue to push fluids and recheck levels today.  Normal renal ultrasound Dyspepsia: Continue omeprazole. Hypotension: Hold lisinopril HCTZ, we adjusted atenolol back from 150 daily.  Continue Cardizem given her A-fib and need for rate control.  Recheck 2 to 3 weeks  Follow up: 3 weeks for recheck 06/11/2022  Orders Placed This Encounter  Procedures   Comprehensive metabolic panel   Lipase   CBC with Differential/Platelet   POCT HgB  A1C   No orders of the defined types were placed in this encounter.     I reviewed the patients updated PMH, FH, and SocHx.    Patient Active Problem List   Diagnosis Date Noted   History of arterial embolism 08/21/2018    Priority: High   Anticoagulant long-term use 03/06/2018    Priority: High   Permanent atrial fibrillation ([place]) 01/23/2018    Priority: High   Diastolic CHF ([place]) A999333    Priority: High   Type 2 diabetes mellitus with peripheral neuropathy ([place]) 01/24/2017    Priority: High   Subclinical hyperthyroidism 01/21/2014    Priority: High   Essential hypertension 02/23/2013    Priority: High   Combined hyperlipidemia associated with type 2 diabetes mellitus ([place]) 02/23/2013    Priority: High   Major depression, chronic 07/18/2012    Priority: High   DJD of right shoulder 01/24/2017    Priority: Medium    Bereavement due to life event 03/05/2016    Priority: Medium    Osteopenia 08/23/2015    Priority: Medium    Idiopathic scoliosis of thoracic spine 07/09/2014    Priority: Medium    Sjogren's syndrome ([place]) 08/04/2012    Priority: Medium    DDD (degenerative disc disease), lumbosacral 07/15/2012    Priority: Medium    Lumbosacral spondylosis 07/15/2012    Priority: Medium    Osteoarthritis of knee 07/15/2012    Priority: Medium    Bilateral edema of lower extremity 01/26/2017  Priority: Low   Bilateral hip bursitis 07/17/2015    Priority: Low   Age-related nuclear cataract of both eyes 03/11/2015    Priority: Low   Insomnia 09/04/2013    Priority: Low   Allergic rhinitis 07/15/2012    Priority: Low   Current Meds  Medication Sig   Acetaminophen 500 MG coapsule Take 1,000 mg by mouth as needed.    apixaban (ELIQUIS) 5 MG TABS tablet Take 1 tablet (5 mg total) by mouth 2 (two) times daily.   atenolol (TENORMIN) 50 MG tablet Take 1 tablet (50 mg total) by mouth daily.   Calcium Carb-Cholecalciferol 600-800 MG-UNIT CHEW Chew 1 each by mouth  daily.    diclofenac sodium (VOLTAREN) 1 % GEL APPLY 2 G TOPICALLY 4 (FOUR) TIMES DAILY AS NEEDED. **PA DENIED**   diltiazem (CARDIZEM CD) 180 MG 24 hr capsule TAKE 1 CAPSULE (180 MG TOTAL) BY MOUTH EVERY MORNING.   Glucosamine-MSM-Hyaluronic Acd (JOINT HEALTH PO) Take 1 tablet by mouth daily.   glucose blood (ONETOUCH ULTRA) test strip USE TWICE DAILY TO CHECK BLOOD SUGAR - E11.40   Krill Oil 500 MG CAPS Take 500 mg by mouth daily.   lisinopril-hydrochlorothiazide (ZESTORETIC) 10-12.5 MG tablet Take 1 tablet by mouth daily.   metFORMIN (GLUCOPHAGE-XR) 500 MG 24 hr tablet Take 2 tablets (1,000 mg total) by mouth daily with breakfast.   Multiple Vitamins-Minerals (MULTIVITAMIN ADULT PO) Take by mouth.   omeprazole (PRILOSEC) 20 MG capsule Take 1 capsule (20 mg total) by mouth daily.   ONETOUCH ULTRA test strip USE AS INSTRUCTED   rosuvastatin (CRESTOR) 40 MG tablet Take 1 tablet (40 mg total) by mouth daily.   vortioxetine HBr (TRINTELLIX) 10 MG TABS tablet Take 1 tablet (10 mg total) by mouth daily.    Allergies: Patient is allergic to amoxicillin-pot clavulanate, amoxicillin, and clarithromycin. Family History: Patient family history includes Arthritis in her mother and sister; Diabetes in her father and sister; Healthy in her daughter, son, and son; Heart attack in her father and mother; Hyperlipidemia in her father; Other in her son. Social History:  Patient  reports that she has never smoked. She has never used smokeless tobacco. She reports that she does not drink alcohol and does not use drugs.  Review of Systems: Constitutional: Negative for fever malaise or anorexia Cardiovascular: negative for chest pain Respiratory: negative for SOB or persistent cough Gastrointestinal: negative for abdominal pain  Objective  Vitals: BP 110/60   Pulse 80   Temp 97.8 F (36.6 C)   Ht '5\' 1"'$  (1.549 m)   Wt 140 lb 3.2 oz (63.6 kg)   SpO2 97%   BMI 26.49 kg/m  General: no acute distress ,  A&Ox3 HEENT: PEERL, conjunctiva normal, neck is supple Cardiovascular:  RRR without murmur or gallop.  Respiratory:  Good breath sounds bilaterally, CTAB with normal respiratory effort Abdomen: Soft, nontender, no masses Skin:  Warm, no rashes  Commons side effects, risks, benefits, and alternatives for medications and treatment plan prescribed today were discussed, and the patient expressed understanding of the given instructions. Patient is instructed to call or message via MyChart if he/she has any questions or concerns regarding our treatment plan. No barriers to understanding were identified. We discussed Red Flag symptoms and signs in detail. Patient expressed understanding regarding what to do in case of urgent or emergency type symptoms.  Medication list was reconciled, printed and provided to the patient in AVS. Patient instructions and summary information was reviewed with the patient as  documented in the AVS. This note was prepared with assistance of Dragon voice recognition software. Occasional wrong-word or sound-a-like substitutions may have occurred due to the inherent limitations of voice recognition software

## 2022-04-12 NOTE — Progress Notes (Signed)
Please call patient: I have reviewed his/her lab results. Labs show her kidneys are not working properly and we need to find out why. She needs to be admitted to the hospital to do this. I have spoken to her.  Please call patient placement at [place], [phone] and let them know that Dr. Lorin Mercy has accepted the admission. They will then call pt with instructions.

## 2022-04-12 NOTE — Patient Instructions (Signed)
Please do not take metformin, rosuvastatin or lisinopril-hydrochlorothiazide until next I see you next.  You may continue all your other medications.   I will release your lab results to you on your MyChart account with further instructions. You may see the results before I do, but when I review them I will send you a message with my report or have my assistant call you if things need to be discussed. Please reply to my message with any questions. Thank you!   I will fax in your forms for your travel insurance for you.
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
## 2022-04-12 NOTE — Telephone Encounter (Signed)
Critical Lab   GFR 10.14  Nicole Jimenez

## 2022-04-12 NOTE — Telephone Encounter (Signed)
Spoke with patient and Dr. Lorin Mercy. Direct admit to hospital for FTT and AKI.

## 2022-04-13 ENCOUNTER — Other Ambulatory Visit: Payer: Self-pay

## 2022-04-13 ENCOUNTER — Encounter (HOSPITAL_COMMUNITY): Payer: Self-pay | Admitting: Internal Medicine

## 2022-04-13 DIAGNOSIS — N179 Acute kidney failure, unspecified: Secondary | ICD-10-CM

## 2022-04-13 DIAGNOSIS — F329 Major depressive disorder, single episode, unspecified: Secondary | ICD-10-CM | POA: Diagnosis not present

## 2022-04-13 DIAGNOSIS — I5032 Chronic diastolic (congestive) heart failure: Secondary | ICD-10-CM

## 2022-04-13 DIAGNOSIS — I4821 Permanent atrial fibrillation: Secondary | ICD-10-CM

## 2022-04-13 DIAGNOSIS — I10 Essential (primary) hypertension: Secondary | ICD-10-CM

## 2022-04-13 DIAGNOSIS — E1142 Type 2 diabetes mellitus with diabetic polyneuropathy: Secondary | ICD-10-CM

## 2022-04-13 LAB — CBC
HCT: 31.9 % — ABNORMAL LOW (ref 36.0–46.0)
Hemoglobin: 10.9 g/dL — ABNORMAL LOW (ref 12.0–15.0)
MCH: 30.3 pg (ref 26.0–34.0)
MCHC: 34.2 g/dL (ref 30.0–36.0)
MCV: 88.6 fL (ref 80.0–100.0)
Platelets: 106 10*3/uL — ABNORMAL LOW (ref 150–400)
RBC: 3.6 MIL/uL — ABNORMAL LOW (ref 3.87–5.11)
RDW: 13 % (ref 11.5–15.5)
WBC: 5 10*3/uL (ref 4.0–10.5)
nRBC: 0 % (ref 0.0–0.2)

## 2022-04-13 LAB — GLUCOSE, CAPILLARY
Glucose-Capillary: 125 mg/dL — ABNORMAL HIGH (ref 70–99)
Glucose-Capillary: 145 mg/dL — ABNORMAL HIGH (ref 70–99)
Glucose-Capillary: 114 mg/dL — ABNORMAL HIGH (ref 70–99)
Glucose-Capillary: 153 mg/dL — ABNORMAL HIGH (ref 70–99)

## 2022-04-13 LAB — COMPREHENSIVE METABOLIC PANEL
ALT: 18 U/L (ref 0–44)
AST: 27 U/L (ref 15–41)
Albumin: 2.9 g/dL — ABNORMAL LOW (ref 3.5–5.0)
Alkaline Phosphatase: 51 U/L (ref 38–126)
Anion gap: 10 (ref 5–15)
BUN: 48 mg/dL — ABNORMAL HIGH (ref 8–23)
CO2: 23 mmol/L (ref 22–32)
Calcium: 8.5 mg/dL — ABNORMAL LOW (ref 8.9–10.3)
Chloride: 103 mmol/L (ref 98–111)
Creatinine, Ser: 4.17 mg/dL — ABNORMAL HIGH (ref 0.44–1.00)
GFR, Estimated: 10 mL/min — ABNORMAL LOW (ref >60)
Glucose, Bld: 120 mg/dL — ABNORMAL HIGH (ref 70–99)
Potassium: 3.2 mmol/L — ABNORMAL LOW (ref 3.5–5.1)
Sodium: 136 mmol/L (ref 135–145)
Total Bilirubin: 0.9 mg/dL (ref 0.3–1.2)
Total Protein: 6.6 g/dL (ref 6.5–8.1)

## 2022-04-13 LAB — URINALYSIS, ROUTINE W REFLEX MICROSCOPIC
Bilirubin Urine: NEGATIVE
Glucose, UA: NEGATIVE mg/dL
Ketones, ur: NEGATIVE mg/dL
Nitrite: NEGATIVE
Protein, ur: NEGATIVE mg/dL
Specific Gravity, Urine: 1.004 — ABNORMAL LOW (ref 1.005–1.030)
pH: 7 (ref 5.0–8.0)

## 2022-04-13 LAB — SODIUM, URINE, RANDOM: Sodium, Ur: 57 mmol/L

## 2022-04-13 LAB — CREATININE, URINE, RANDOM: Creatinine, Urine: 20 mg/dL

## 2022-04-13 MED ORDER — VORTIOXETINE HBR 5 MG PO TABS
10.0000 mg | ORAL_TABLET | Freq: Every day | ORAL | Status: DC
  Administered 2022-04-13 – 2022-04-20 (×8): 10 mg via ORAL
  Filled 2022-04-13 (×8): qty 2

## 2022-04-13 MED ORDER — LACTATED RINGERS IV BOLUS
1000.0000 mL | Freq: Once | INTRAVENOUS | Status: AC
  Administered 2022-04-13: 1000 mL via INTRAVENOUS

## 2022-04-13 MED ORDER — LACTATED RINGERS IV SOLN
INTRAVENOUS | Status: DC
  Administered 2022-04-13: 1000 mL via INTRAVENOUS
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
## 2022-04-13 NOTE — Care Plan (Addendum)
Ms Yong is AxOx4, very pleasant lady, anxious, that reported 1 watery bowel movement today after she ate lunch. We agreed to monitor and treat if another episode will happen. Dr Sarajane Jews notified, including level K 3.2. She ate aprox 70% of breakfast and dinner and has good PO fluid intake. No PRN meds given, no falls or injuries during the shift. Plan of care discussed with the patient.
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
## 2022-04-14 ENCOUNTER — Inpatient Hospital Stay (HOSPITAL_COMMUNITY): Payer: Medicare Other

## 2022-04-14 DIAGNOSIS — N179 Acute kidney failure, unspecified: Secondary | ICD-10-CM | POA: Diagnosis not present

## 2022-04-14 LAB — CBC
HCT: 34.7 % — ABNORMAL LOW (ref 36.0–46.0)
Hemoglobin: 11.7 g/dL — ABNORMAL LOW (ref 12.0–15.0)
MCH: 30.2 pg (ref 26.0–34.0)
MCHC: 33.7 g/dL (ref 30.0–36.0)
MCV: 89.4 fL (ref 80.0–100.0)
Platelets: 108 10*3/uL — ABNORMAL LOW (ref 150–400)
RBC: 3.88 MIL/uL (ref 3.87–5.11)
RDW: 13.1 % (ref 11.5–15.5)
WBC: 6.2 10*3/uL (ref 4.0–10.5)
nRBC: 0 % (ref 0.0–0.2)

## 2022-04-14 LAB — GLUCOSE, CAPILLARY
Glucose-Capillary: 137 mg/dL — ABNORMAL HIGH (ref 70–99)
Glucose-Capillary: 109 mg/dL — ABNORMAL HIGH (ref 70–99)
Glucose-Capillary: 124 mg/dL — ABNORMAL HIGH (ref 70–99)
Glucose-Capillary: 132 mg/dL — ABNORMAL HIGH (ref 70–99)

## 2022-04-14 LAB — BASIC METABOLIC PANEL
Anion gap: 7 (ref 5–15)
BUN: 35 mg/dL — ABNORMAL HIGH (ref 8–23)
CO2: 27 mmol/L (ref 22–32)
Calcium: 9 mg/dL (ref 8.9–10.3)
Chloride: 106 mmol/L (ref 98–111)
Creatinine, Ser: 4.21 mg/dL — ABNORMAL HIGH (ref 0.44–1.00)
GFR, Estimated: 10 mL/min — ABNORMAL LOW (ref >60)
Glucose, Bld: 126 mg/dL — ABNORMAL HIGH (ref 70–99)
Potassium: 3.3 mmol/L — ABNORMAL LOW (ref 3.5–5.1)
Sodium: 140 mmol/L (ref 135–145)

## 2022-04-14 MED ORDER — BISMUTH SUBSALICYLATE 262 MG PO CHEW
524.0000 mg | CHEWABLE_TABLET | ORAL | Status: DC | PRN
  Administered 2022-04-14: 524 mg via ORAL
  Administered 2022-04-14: 262 mg via ORAL
  Administered 2022-04-15: 524 mg via ORAL
  Filled 2022-04-14 (×4): qty 2

## 2022-04-14 MED ORDER — BISMUTH SUBSALICYLATE 262 MG/15ML PO SUSP
30.0000 mL | Freq: Three times a day (TID) | ORAL | Status: DC
  Filled 2022-04-14: qty 236
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
## 2022-04-14 NOTE — Care Management Important Message (Signed)
Important Message  Patient Details  Name: Nicole Jimenez MRN: [id number] Date of Birth: 1934-05-25   Medicare Important Message Given:  Yes     Booker Bhatnagar [address] 04/14/2022, 2:36 PM

## 2022-04-14 NOTE — TOC Progression Note (Signed)
Transition of Care [facility]) - Progression Note    Patient Details  Name: Nicole Jimenez MRN: [id number] Date of Birth: [birth year]  Transition of Care [facility]) CM/SW [place], RN Phone Number: 04/14/2022, 11:31 AM  Clinical Narrative:     Transition of Care (TOC) Screening Note   Patient Details  Name: Nicole Jimenez Date of Birth: 06-Sep-1934   Transition of Care [facility]) CM/SW Contact:    Curlene Labrum, RN Phone Number: 04/14/2022, 11:31 AM    Transition of Care Department [facility]) has reviewed patient and no TOC needs have been identified at this time.   We will continue to monitor patient advancement through interdisciplinary progression rounds. If new patient transition needs arise, please place a TOC consult.          Expected Discharge Plan and Services                                               Social Determinants of Health (SDOH) Interventions SDOH Screenings   Food Insecurity: No Food Insecurity (04/13/2022)  Housing: Low Risk  (04/13/2022)  Transportation Needs: No Transportation Needs (04/13/2022)  Utilities: Not At Risk (04/13/2022)  Depression (PHQ2-9): Low Risk  (04/12/2022)  Financial Resource Strain: Low Risk  (06/05/2021)  Physical Activity: Insufficiently Active (06/05/2021)  Social Connections: Moderately Integrated (06/05/2021)  Stress: Stress Concern Present (06/05/2021)  Tobacco Use: Low Risk  (04/13/2022)    Readmission Risk Interventions     No data to display

## 2022-04-15 DIAGNOSIS — N179 Acute kidney failure, unspecified: Secondary | ICD-10-CM | POA: Diagnosis not present

## 2022-04-15 LAB — COMPREHENSIVE METABOLIC PANEL
ALT: 15 U/L (ref 0–44)
AST: 27 U/L (ref 15–41)
Albumin: 3 g/dL — ABNORMAL LOW (ref 3.5–5.0)
Alkaline Phosphatase: 50 U/L (ref 38–126)
Anion gap: 11 (ref 5–15)
BUN: 28 mg/dL — ABNORMAL HIGH (ref 8–23)
CO2: 24 mmol/L (ref 22–32)
Calcium: 8.8 mg/dL — ABNORMAL LOW (ref 8.9–10.3)
Chloride: 104 mmol/L (ref 98–111)
Creatinine, Ser: 3.91 mg/dL — ABNORMAL HIGH (ref 0.44–1.00)
GFR, Estimated: 11 mL/min — ABNORMAL LOW (ref >60)
Glucose, Bld: 111 mg/dL — ABNORMAL HIGH (ref 70–99)
Potassium: 2.9 mmol/L — ABNORMAL LOW (ref 3.5–5.1)
Sodium: 139 mmol/L (ref 135–145)
Total Bilirubin: 1.2 mg/dL (ref 0.3–1.2)
Total Protein: 6.6 g/dL (ref 6.5–8.1)

## 2022-04-15 LAB — CBC WITH DIFFERENTIAL/PLATELET
Abs Immature Granulocytes: 0.02 10*3/uL (ref 0.00–0.07)
Basophils Absolute: 0 10*3/uL (ref 0.0–0.1)
Basophils Relative: 1 %
Eosinophils Absolute: 0.2 10*3/uL (ref 0.0–0.5)
Eosinophils Relative: 3 %
HCT: 34 % — ABNORMAL LOW (ref 36.0–46.0)
Hemoglobin: 11.3 g/dL — ABNORMAL LOW (ref 12.0–15.0)
Immature Granulocytes: 0 %
Lymphocytes Relative: 22 %
Lymphs Abs: 1.4 10*3/uL (ref 0.7–4.0)
MCH: 29.8 pg (ref 26.0–34.0)
MCHC: 33.2 g/dL (ref 30.0–36.0)
MCV: 89.7 fL (ref 80.0–100.0)
Monocytes Absolute: 0.7 10*3/uL (ref 0.1–1.0)
Monocytes Relative: 11 %
Neutro Abs: 4.1 10*3/uL (ref 1.7–7.7)
Neutrophils Relative %: 63 %
Platelets: 98 10*3/uL — ABNORMAL LOW (ref 150–400)
RBC: 3.79 MIL/uL — ABNORMAL LOW (ref 3.87–5.11)
RDW: 13 % (ref 11.5–15.5)
WBC: 6.5 10*3/uL (ref 4.0–10.5)
nRBC: 0 % (ref 0.0–0.2)

## 2022-04-15 LAB — BRAIN NATRIURETIC PEPTIDE: B Natriuretic Peptide: 808.7 pg/mL — ABNORMAL HIGH (ref 0.0–100.0)

## 2022-04-15 LAB — GLUCOSE, CAPILLARY
Glucose-Capillary: 125 mg/dL — ABNORMAL HIGH (ref 70–99)
Glucose-Capillary: 146 mg/dL — ABNORMAL HIGH (ref 70–99)
Glucose-Capillary: 155 mg/dL — ABNORMAL HIGH (ref 70–99)
Glucose-Capillary: 153 mg/dL — ABNORMAL HIGH (ref 70–99)

## 2022-04-15 LAB — MAGNESIUM: Magnesium: 1.5 mg/dL — ABNORMAL LOW (ref 1.7–2.4)

## 2022-04-15 LAB — PHOSPHORUS: Phosphorus: 2.9 mg/dL (ref 2.5–4.6)

## 2022-04-15 MED ORDER — ENSURE ENLIVE PO LIQD
237.0000 mL | Freq: Two times a day (BID) | ORAL | Status: DC
  Administered 2022-04-15 – 2022-04-19 (×6): 237 mL via ORAL
  Filled 2022-04-15: qty 237

## 2022-04-15 MED ORDER — POTASSIUM CHLORIDE CRYS ER 20 MEQ PO TBCR
40.0000 meq | EXTENDED_RELEASE_TABLET | Freq: Once | ORAL | Status: AC
  Administered 2022-04-15: 40 meq via ORAL
  Filled 2022-04-15: qty 2

## 2022-04-15 MED ORDER — MAGNESIUM OXIDE -MG SUPPLEMENT 400 (240 MG) MG PO TABS
400.0000 mg | ORAL_TABLET | Freq: Every day | ORAL | Status: DC
  Administered 2022-04-15 – 2022-04-20 (×6): 400 mg via ORAL
  Filled 2022-04-15 (×6): qty 1

## 2022-04-15 MED ORDER — MAGNESIUM SULFATE 4 GM/100ML IV SOLN
4.0000 g | Freq: Once | INTRAVENOUS | Status: DC
  Filled 2022-04-15: qty 100
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
## 2022-04-15 NOTE — Progress Notes (Signed)
Initial Nutrition Assessment  DOCUMENTATION CODES:   Not applicable  INTERVENTION:  - Add Ensure Enlive po BID, each supplement provides 350 kcal and 20 grams of protein.    NUTRITION DIAGNOSIS:   Inadequate oral intake related to poor appetite as evidenced by per patient/family report.  GOAL:   Patient will meet greater than or equal to 90% of their needs  MONITOR:   PO intake  REASON FOR ASSESSMENT:   Malnutrition Screening Tool    ASSESSMENT:   88 y.o. female admits related to abnormal labs. PMH includes: arthritis, DM, CHF, GERD, HLD, HTN, PAD. Pt is currently receiving medical management related to AKI.  Meds reviewed: sliding scale insulin, klor-con. Labs reviewed: K low, Mag low, BUN/Creatinine elevated  The pt reports that she does not have an appetite. She states that she has not had an appetite for about a month now. She reports that it is slowly starting to improve. No significant wt loss per record. Pt states that she would like to try Ensure supplements and prefers the chocolate flavor. RD will continue to monitor PO intakes.   NUTRITION - FOCUSED PHYSICAL EXAM:  WDL - no wasting noted.   Diet Order:   Diet Order             Diet regular Fluid consistency: Thin  Diet effective now                   EDUCATION NEEDS:   Not appropriate for education at this time  Skin:  Skin Assessment: Reviewed RN Assessment  Last BM:  04/14/22  Height:   Ht Readings from Last 1 Encounters:  04/12/22 '5\' 1"'$  (1.549 m)    Weight:   Wt Readings from Last 1 Encounters:  04/15/22 67.6 kg    Ideal Body Weight:     BMI:  Body mass index is 28.16 kg/m.  Estimated Nutritional Needs:   Kcal:  1590-1905 kcals  Protein:  80-95 gm  Fluid:  >/= 1.5 L  Thalia Bloodgood, RD, LDN, CNSC.

## 2022-04-15 NOTE — Evaluation (Signed)
Physical Therapy Evaluation Patient Details Name: Nicole Jimenez MRN: [id number] DOB: 14-Sep-1934 Today's Date: 04/15/2022  History of Present Illness  88 yo female presents to [facility] from OP office for AKI in setting of decreased PO intake and recent mild pancreatitis. PMH includes diabetes, atrial fibrillation, depression, hyperlipidemia, hypertension, diastolic CHF, Sjogren's syndrome, subclinical hypothyroidism.  Clinical Impression   Pt presents with min weakness and min balance deficits, anticipate pt is close to baseline but likely slightly weaker. Pt to benefit from acute PT to address deficits. Pt ambulated hallway distance without AD and with supervision for safety only. Pt states she feels close to baseline, will follow along with pt while acute to progress to baseline.      Recommendations for follow up therapy are one component of a multi-disciplinary discharge planning process, led by the attending physician.  Recommendations may be updated based on patient status, additional functional criteria and insurance authorization.  Follow Up Recommendations No PT follow up      Assistance Recommended at Discharge PRN  Patient can return home with the following       Equipment Recommendations None recommended by PT  Recommendations for Other Services       Functional Status Assessment Patient has had a recent decline in their functional status and demonstrates the ability to make significant improvements in function in a reasonable and predictable amount of time.     Precautions / Restrictions Precautions Precautions: None Restrictions Weight Bearing Restrictions: No      Mobility  Bed Mobility Overal bed mobility: Modified Independent             General bed mobility comments: up in chair    Transfers Overall transfer level: Modified independent Equipment used: None               General transfer comment: slow to rise, no device or assist     Ambulation/Gait Ambulation/Gait assistance: Supervision Gait Distance (Feet): 350 Feet Assistive device: None Gait Pattern/deviations: Step-through pattern, Decreased stride length, Drifts right/left Gait velocity: decr     General Gait Details: periods of weaving of gait with distraction, anticipate this is pt baseline and is pt-corrected  Financial trader Rankin (Stroke Patients Only)       Balance Overall balance assessment: Needs assistance Sitting-balance support: No upper extremity supported, Feet supported Sitting balance-Leahy Scale: Good       Standing balance-Leahy Scale: Good Standing balance comment: min weaving of gait with challenge, no LOB and pt-corrected                             Pertinent Vitals/Pain Pain Assessment Pain Assessment: No/denies pain    Home Living Family/patient expects to be discharged to:: Private residence Living Arrangements: Children (son) Available Help at Discharge: Available PRN/intermittently;Family Type of Home: House Home Access: Stairs to enter   [employer] of Steps: 2   Home Layout: Two level;Able to live on main level with bedroom/bathroom Home Equipment: Rolling Walker (2 wheels);Cane - single point;Wheelchair - manual      Prior Function Prior Level of Function : Independent/Modified Independent                     Hand Dominance   Dominant Hand: Right    Extremity/Trunk Assessment   Upper Extremity Assessment Upper Extremity Assessment: Defer to OT evaluation  Lower Extremity Assessment Lower Extremity Assessment: Generalized weakness    Cervical / Trunk Assessment Cervical / Trunk Assessment: Normal  Communication   Communication: No difficulties  Cognition Arousal/Alertness: Awake/alert Behavior During Therapy: WFL for tasks assessed/performed Overall Cognitive Status: Within Functional Limits for tasks assessed                                           General Comments      Exercises     Assessment/Plan    PT Assessment Patient needs continued PT services  PT Problem List Decreased strength;Decreased mobility;Decreased activity tolerance;Decreased balance;Decreased knowledge of use of DME       PT Treatment Interventions DME instruction;Therapeutic activities;Gait training;Therapeutic exercise;Patient/family education;Balance training;Stair training;Functional mobility training;Neuromuscular re-education    PT Goals (Current goals can be found in the Care Plan section)  Acute Rehab PT Goals Patient Stated Goal: go home PT Goal Formulation: With patient Time For Goal Achievement: 04/29/22 Potential to Achieve Goals: Good    Frequency Min 3X/week     Co-evaluation               AM-PAC PT "6 Clicks" Mobility  Outcome Measure Help needed turning from your back to your side while in a flat bed without using bedrails?: None Help needed moving from lying on your back to sitting on the side of a flat bed without using bedrails?: None Help needed moving to and from a bed to a chair (including a wheelchair)?: None Help needed standing up from a chair using your arms (e.g., wheelchair or bedside chair)?: None Help needed to walk in hospital room?: A Little Help needed climbing 3-5 steps with a railing? : A Little 6 Click Score: 22    End of Session   Activity Tolerance: Patient tolerated treatment well Patient left: in chair;with call bell/phone within reach;Other (comment) (pt up in chair without alarm upon PT arrival to room, pt states staff lets her mobilize ad lib) Nurse Communication: Mobility status PT Visit Diagnosis: Other abnormalities of gait and mobility (R26.89)    Time: [id number] PT Time Calculation (min) (ACUTE ONLY): 15 min   Charges:   PT Evaluation $PT Eval Low Complexity: 1 Low          Tanairy Payeur S, PT DPT Acute Rehabilitation Services Pager  [phone]  Office [phone]   Roxine Caddy E Ruffin Pyo 04/15/2022, 2:12 PM

## 2022-04-16 DIAGNOSIS — N179 Acute kidney failure, unspecified: Secondary | ICD-10-CM | POA: Diagnosis not present

## 2022-04-16 DIAGNOSIS — Z0279 Encounter for issue of other medical certificate: Secondary | ICD-10-CM

## 2022-04-16 LAB — BASIC METABOLIC PANEL
Anion gap: 9 (ref 5–15)
Anion gap: 15 (ref 5–15)
BUN: 29 mg/dL — ABNORMAL HIGH (ref 8–23)
BUN: 25 mg/dL — ABNORMAL HIGH (ref 8–23)
CO2: 28 mmol/L (ref 22–32)
CO2: 19 mmol/L — ABNORMAL LOW (ref 22–32)
Calcium: 8.6 mg/dL — ABNORMAL LOW (ref 8.9–10.3)
Calcium: 8.5 mg/dL — ABNORMAL LOW (ref 8.9–10.3)
Chloride: 103 mmol/L (ref 98–111)
Chloride: 102 mmol/L (ref 98–111)
Creatinine, Ser: 3.7 mg/dL — ABNORMAL HIGH (ref 0.44–1.00)
Creatinine, Ser: 3.59 mg/dL — ABNORMAL HIGH (ref 0.44–1.00)
GFR, Estimated: 11 mL/min — ABNORMAL LOW (ref >60)
GFR, Estimated: 12 mL/min — ABNORMAL LOW (ref >60)
Glucose, Bld: 127 mg/dL — ABNORMAL HIGH (ref 70–99)
Glucose, Bld: 206 mg/dL — ABNORMAL HIGH (ref 70–99)
Potassium: 3.5 mmol/L (ref 3.5–5.1)
Potassium: 2.9 mmol/L — ABNORMAL LOW (ref 3.5–5.1)
Sodium: 140 mmol/L (ref 135–145)
Sodium: 136 mmol/L (ref 135–145)

## 2022-04-16 LAB — CBC
HCT: 31.1 % — ABNORMAL LOW (ref 36.0–46.0)
Hemoglobin: 10.3 g/dL — ABNORMAL LOW (ref 12.0–15.0)
MCH: 29.8 pg (ref 26.0–34.0)
MCHC: 33.1 g/dL (ref 30.0–36.0)
MCV: 89.9 fL (ref 80.0–100.0)
Platelets: 87 10*3/uL — ABNORMAL LOW (ref 150–400)
RBC: 3.46 MIL/uL — ABNORMAL LOW (ref 3.87–5.11)
RDW: 13.1 % (ref 11.5–15.5)
WBC: 5.5 10*3/uL (ref 4.0–10.5)
nRBC: 0 % (ref 0.0–0.2)

## 2022-04-16 LAB — MAGNESIUM: Magnesium: 1.4 mg/dL — ABNORMAL LOW (ref 1.7–2.4)

## 2022-04-16 LAB — ABO/RH: ABO/RH(D): O POS

## 2022-04-16 LAB — GLUCOSE, CAPILLARY
Glucose-Capillary: 130 mg/dL — ABNORMAL HIGH (ref 70–99)
Glucose-Capillary: 159 mg/dL — ABNORMAL HIGH (ref 70–99)
Glucose-Capillary: 209 mg/dL — ABNORMAL HIGH (ref 70–99)
Glucose-Capillary: 114 mg/dL — ABNORMAL HIGH (ref 70–99)

## 2022-04-16 LAB — HEMOGLOBIN AND HEMATOCRIT, BLOOD
HCT: 33.2 % — ABNORMAL LOW (ref 36.0–46.0)
Hemoglobin: 11.4 g/dL — ABNORMAL LOW (ref 12.0–15.0)

## 2022-04-16 LAB — TYPE AND SCREEN
ABO/RH(D): O POS
Antibody Screen: NEGATIVE

## 2022-04-16 LAB — PHOSPHORUS: Phosphorus: 2.8 mg/dL (ref 2.5–4.6)

## 2022-04-16 MED ORDER — LOPERAMIDE HCL 2 MG PO CAPS
2.0000 mg | ORAL_CAPSULE | ORAL | Status: DC | PRN
  Administered 2022-04-16: 2 mg via ORAL
  Filled 2022-04-16 (×2): qty 1

## 2022-04-16 MED ORDER — POTASSIUM CHLORIDE CRYS ER 20 MEQ PO TBCR
40.0000 meq | EXTENDED_RELEASE_TABLET | Freq: Once | ORAL | Status: AC
  Administered 2022-04-16: 40 meq via ORAL
  Filled 2022-04-16: qty 2

## 2022-04-16 NOTE — Progress Notes (Signed)
Mobility Specialist - Progress Note   04/16/22 1523  Mobility  Activity Ambulated with assistance in hallway  Level of Assistance Standby assist, set-up cues, supervision of patient - no hands on  Assistive Device None  Distance Ambulated (ft) 350 ft  Activity Response Tolerated well  Mobility Referral Yes  $Mobility charge 1 Mobility   Pt received in chair and agreeable to mobility. No complaints throughout session. Pt was returned to chair with all needs met.   Franki Monte  Mobility Specialist Please contact via [facility] or Rehab office at [phone]
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
## 2022-04-16 NOTE — Progress Notes (Signed)
Mobility Specialist - Progress Note   04/16/22 1052  Mobility  Activity Ambulated with assistance in hallway  Level of Assistance Standby assist, set-up cues, supervision of patient - no hands on  Assistive Device None  Distance Ambulated (ft) 350 ft  Activity Response Tolerated well  Mobility Referral Yes  $Mobility charge 1 Mobility   Pt was received in chair and agreeable to mobility. No complaints throughout session. Pt was returned to chair with all needs met.   Franki Monte  Mobility Specialist Please contact via Solicitor or Rehab office at [phone]

## 2022-04-17 DIAGNOSIS — N179 Acute kidney failure, unspecified: Secondary | ICD-10-CM | POA: Diagnosis not present

## 2022-04-17 LAB — BASIC METABOLIC PANEL
Anion gap: 10 (ref 5–15)
BUN: 23 mg/dL (ref 8–23)
CO2: 24 mmol/L (ref 22–32)
Calcium: 8.4 mg/dL — ABNORMAL LOW (ref 8.9–10.3)
Chloride: 104 mmol/L (ref 98–111)
Creatinine, Ser: 3.36 mg/dL — ABNORMAL HIGH (ref 0.44–1.00)
GFR, Estimated: 13 mL/min — ABNORMAL LOW (ref >60)
Glucose, Bld: 107 mg/dL — ABNORMAL HIGH (ref 70–99)
Potassium: 3.6 mmol/L (ref 3.5–5.1)
Sodium: 138 mmol/L (ref 135–145)

## 2022-04-17 LAB — GLUCOSE, CAPILLARY
Glucose-Capillary: 117 mg/dL — ABNORMAL HIGH (ref 70–99)
Glucose-Capillary: 190 mg/dL — ABNORMAL HIGH (ref 70–99)
Glucose-Capillary: 200 mg/dL — ABNORMAL HIGH (ref 70–99)
Glucose-Capillary: 200 mg/dL — ABNORMAL HIGH (ref 70–99)

## 2022-04-17 LAB — CBC
HCT: 31.1 % — ABNORMAL LOW (ref 36.0–46.0)
Hemoglobin: 10.5 g/dL — ABNORMAL LOW (ref 12.0–15.0)
MCH: 29.9 pg (ref 26.0–34.0)
MCHC: 33.8 g/dL (ref 30.0–36.0)
MCV: 88.6 fL (ref 80.0–100.0)
Platelets: 90 10*3/uL — ABNORMAL LOW (ref 150–400)
RBC: 3.51 MIL/uL — ABNORMAL LOW (ref 3.87–5.11)
RDW: 13 % (ref 11.5–15.5)
WBC: 5.5 10*3/uL (ref 4.0–10.5)
nRBC: 0 % (ref 0.0–0.2)

## 2022-04-17 LAB — MAGNESIUM: Magnesium: 1.5 mg/dL — ABNORMAL LOW (ref 1.7–2.4)

## 2022-04-17 LAB — PHOSPHORUS: Phosphorus: 2.7 mg/dL (ref 2.5–4.6)

## 2022-04-17 MED ORDER — FUROSEMIDE 10 MG/ML IJ SOLN
40.0000 mg | Freq: Once | INTRAMUSCULAR | Status: AC
  Administered 2022-04-17: 40 mg via INTRAVENOUS
  Filled 2022-04-17: qty 4

## 2022-04-17 MED ORDER — POTASSIUM CHLORIDE CRYS ER 20 MEQ PO TBCR
40.0000 meq | EXTENDED_RELEASE_TABLET | Freq: Once | ORAL | Status: AC
  Administered 2022-04-17: 40 meq via ORAL
  Filled 2022-04-17: qty 2

## 2022-04-17 NOTE — Plan of Care (Signed)
  Problem: Education: Goal: Knowledge of General Education information will improve Description: Including pain rating scale, medication(s)/side effects and non-pharmacologic comfort measures Outcome: Progressing   Problem: Health Behavior/Discharge Planning: Goal: Ability to manage health-related needs will improve Outcome: Progressing   Problem: Clinical Measurements: Goal: Ability to maintain clinical measurements within normal limits will improve Outcome: Progressing Goal: Will remain free from infection Outcome: Progressing Goal: Diagnostic test results will improve Outcome: Progressing Goal: Respiratory complications will improve Outcome: Progressing Goal: Cardiovascular complication will be avoided Outcome: Progressing   Problem: Activity: Goal: Risk for activity intolerance will decrease Outcome: Progressing   Problem: Nutrition: Goal: Adequate nutrition will be maintained Outcome: Progressing   Problem: Coping: Goal: Level of anxiety will decrease Outcome: Progressing   Problem: Elimination: Goal: Will not experience complications related to bowel motility Outcome: Progressing Goal: Will not experience complications related to urinary retention Outcome: Progressing   Problem: Pain Managment: Goal: General experience of comfort will improve Outcome: Progressing   Problem: Safety: Goal: Ability to remain free from injury will improve Outcome: Progressing   Problem: Skin Integrity: Goal: Risk for impaired skin integrity will decrease Outcome: Progressing   Problem: Education: Goal: Ability to describe self-care measures that may prevent or decrease complications (Diabetes Survival Skills Education) will improve Outcome: Progressing Goal: Individualized Educational Video(s) Outcome: Progressing   Problem: Coping: Goal: Ability to adjust to condition or change in health will improve Outcome: Progressing   Problem: Fluid Volume: Goal: Ability to  maintain a balanced intake and output will improve Outcome: Progressing   Problem: Health Behavior/Discharge Planning: Goal: Ability to identify and utilize available resources and services will improve Outcome: Progressing Goal: Ability to manage health-related needs will improve Outcome: Progressing   Problem: Metabolic: Goal: Ability to maintain appropriate glucose levels will improve Outcome: Progressing   Problem: Nutritional: Goal: Maintenance of adequate nutrition will improve Outcome: Progressing Goal: Progress toward achieving an optimal weight will improve Outcome: Progressing   Problem: Skin Integrity: Goal: Risk for impaired skin integrity will decrease Outcome: Progressing   Problem: Tissue Perfusion: Goal: Adequacy of tissue perfusion will improve 04/17/2022 2338 by Larna Daughters, RN Outcome: Progressing 04/17/2022 2336 by Larna Daughters, RN Outcome: Progressing

## 2022-04-17 NOTE — Plan of Care (Addendum)
  Problem: Education: Goal: Knowledge of General Education information will improve Description: Including pain rating scale, medication(s)/side effects and non-pharmacologic comfort measures Outcome: Progressing   Problem: Health Behavior/Discharge Planning: Goal: Ability to manage health-related needs will improve Outcome: Progressing   Problem: Clinical Measurements: Goal: Ability to maintain clinical measurements within normal limits will improve Outcome: Progressing   Problem: Activity: Goal: Risk for activity intolerance will decrease Outcome: Progressing   Problem: Nutrition: Goal: Adequate nutrition will be maintained Outcome: Progressing   Problem: Coping: Goal: Level of anxiety will decrease Outcome: Progressing   Problem: Elimination: Goal: Will not experience complications related to bowel motility Outcome: Progressing Goal: Will not experience complications related to urinary retention Outcome: Progressing   Problem: Safety: Goal: Ability to remain free from injury will improve Outcome: Progressing
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
## 2022-04-18 DIAGNOSIS — N179 Acute kidney failure, unspecified: Secondary | ICD-10-CM | POA: Diagnosis not present

## 2022-04-18 LAB — BASIC METABOLIC PANEL
Anion gap: 12 (ref 5–15)
BUN: 27 mg/dL — ABNORMAL HIGH (ref 8–23)
CO2: 28 mmol/L (ref 22–32)
Calcium: 8.7 mg/dL — ABNORMAL LOW (ref 8.9–10.3)
Chloride: 100 mmol/L (ref 98–111)
Creatinine, Ser: 3.02 mg/dL — ABNORMAL HIGH (ref 0.44–1.00)
GFR, Estimated: 15 mL/min — ABNORMAL LOW (ref >60)
Glucose, Bld: 111 mg/dL — ABNORMAL HIGH (ref 70–99)
Potassium: 4.1 mmol/L (ref 3.5–5.1)
Sodium: 140 mmol/L (ref 135–145)

## 2022-04-18 LAB — CBC
HCT: 30.9 % — ABNORMAL LOW (ref 36.0–46.0)
Hemoglobin: 10.6 g/dL — ABNORMAL LOW (ref 12.0–15.0)
MCH: 30.1 pg (ref 26.0–34.0)
MCHC: 34.3 g/dL (ref 30.0–36.0)
MCV: 87.8 fL (ref 80.0–100.0)
Platelets: 99 10*3/uL — ABNORMAL LOW (ref 150–400)
RBC: 3.52 MIL/uL — ABNORMAL LOW (ref 3.87–5.11)
RDW: 12.9 % (ref 11.5–15.5)
WBC: 5.4 10*3/uL (ref 4.0–10.5)
nRBC: 0 % (ref 0.0–0.2)

## 2022-04-18 LAB — GLUCOSE, CAPILLARY
Glucose-Capillary: 123 mg/dL — ABNORMAL HIGH (ref 70–99)
Glucose-Capillary: 208 mg/dL — ABNORMAL HIGH (ref 70–99)
Glucose-Capillary: 146 mg/dL — ABNORMAL HIGH (ref 70–99)
Glucose-Capillary: 249 mg/dL — ABNORMAL HIGH (ref 70–99)

## 2022-04-18 LAB — PHOSPHORUS: Phosphorus: 2.5 mg/dL (ref 2.5–4.6)

## 2022-04-18 LAB — MAGNESIUM: Magnesium: 1.6 mg/dL — ABNORMAL LOW (ref 1.7–2.4)

## 2022-04-18 MED ORDER — POTASSIUM CHLORIDE CRYS ER 20 MEQ PO TBCR
40.0000 meq | EXTENDED_RELEASE_TABLET | Freq: Once | ORAL | Status: AC
  Administered 2022-04-18: 40 meq via ORAL
  Filled 2022-04-18: qty 2

## 2022-04-18 MED ORDER — FUROSEMIDE 10 MG/ML IJ SOLN
40.0000 mg | Freq: Once | INTRAMUSCULAR | Status: AC
  Administered 2022-04-18: 40 mg via INTRAVENOUS
  Filled 2022-04-18: qty 4

## 2022-04-18 MED ORDER — FUROSEMIDE 10 MG/ML IJ SOLN
40.0000 mg | Freq: Every day | INTRAMUSCULAR | Status: DC
  Administered 2022-04-19: 40 mg via INTRAVENOUS
  Filled 2022-04-18: qty 4
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
## 2022-04-18 NOTE — Progress Notes (Signed)
Mobility Specialist Progress Note   04/18/22 1555  Mobility  Activity Ambulated with assistance in hallway  Level of Assistance Standby assist, set-up cues, supervision of patient - no hands on  Assistive Device None  Distance Ambulated (ft) 500 ft  Range of Motion/Exercises Active;All extremities  Activity Response Tolerated well   Patient received in supine and agreeable to participate. Ambulated supervision level with slow steady gait. Returned to room without complaint or incident. Was left standing at bedside with all needs met, RN present.  Nicole Jimenez, BS EXP Mobility Specialist Please contact via SecureChat or Rehab office at [phone]

## 2022-04-19 ENCOUNTER — Other Ambulatory Visit (HOSPITAL_COMMUNITY): Payer: Medicare Other

## 2022-04-19 ENCOUNTER — Inpatient Hospital Stay (HOSPITAL_COMMUNITY): Payer: Medicare Other

## 2022-04-19 DIAGNOSIS — N179 Acute kidney failure, unspecified: Secondary | ICD-10-CM | POA: Diagnosis not present

## 2022-04-19 LAB — COMPREHENSIVE METABOLIC PANEL
ALT: 21 U/L (ref 0–44)
AST: 25 U/L (ref 15–41)
Albumin: 3 g/dL — ABNORMAL LOW (ref 3.5–5.0)
Alkaline Phosphatase: 53 U/L (ref 38–126)
Anion gap: 11 (ref 5–15)
BUN: 29 mg/dL — ABNORMAL HIGH (ref 8–23)
CO2: 32 mmol/L (ref 22–32)
Calcium: 9.2 mg/dL (ref 8.9–10.3)
Chloride: 96 mmol/L — ABNORMAL LOW (ref 98–111)
Creatinine, Ser: 3.05 mg/dL — ABNORMAL HIGH (ref 0.44–1.00)
GFR, Estimated: 14 mL/min — ABNORMAL LOW (ref >60)
Glucose, Bld: 113 mg/dL — ABNORMAL HIGH (ref 70–99)
Potassium: 4.2 mmol/L (ref 3.5–5.1)
Sodium: 139 mmol/L (ref 135–145)
Total Bilirubin: 1 mg/dL (ref 0.3–1.2)
Total Protein: 7.1 g/dL (ref 6.5–8.1)

## 2022-04-19 LAB — CBC WITH DIFFERENTIAL/PLATELET
Abs Immature Granulocytes: 0.01 10*3/uL (ref 0.00–0.07)
Basophils Absolute: 0 10*3/uL (ref 0.0–0.1)
Basophils Relative: 0 %
Eosinophils Absolute: 0.3 10*3/uL (ref 0.0–0.5)
Eosinophils Relative: 5 %
HCT: 33.8 % — ABNORMAL LOW (ref 36.0–46.0)
Hemoglobin: 11.3 g/dL — ABNORMAL LOW (ref 12.0–15.0)
Immature Granulocytes: 0 %
Lymphocytes Relative: 24 %
Lymphs Abs: 1.3 10*3/uL (ref 0.7–4.0)
MCH: 30 pg (ref 26.0–34.0)
MCHC: 33.4 g/dL (ref 30.0–36.0)
MCV: 89.7 fL (ref 80.0–100.0)
Monocytes Absolute: 0.7 10*3/uL (ref 0.1–1.0)
Monocytes Relative: 13 %
Neutro Abs: 3.1 10*3/uL (ref 1.7–7.7)
Neutrophils Relative %: 58 %
Platelets: 118 10*3/uL — ABNORMAL LOW (ref 150–400)
RBC: 3.77 MIL/uL — ABNORMAL LOW (ref 3.87–5.11)
RDW: 13 % (ref 11.5–15.5)
WBC: 5.5 10*3/uL (ref 4.0–10.5)
nRBC: 0 % (ref 0.0–0.2)

## 2022-04-19 LAB — GLUCOSE, CAPILLARY
Glucose-Capillary: 148 mg/dL — ABNORMAL HIGH (ref 70–99)
Glucose-Capillary: 216 mg/dL — ABNORMAL HIGH (ref 70–99)
Glucose-Capillary: 127 mg/dL — ABNORMAL HIGH (ref 70–99)
Glucose-Capillary: 182 mg/dL — ABNORMAL HIGH (ref 70–99)

## 2022-04-19 LAB — MAGNESIUM: Magnesium: 1.7 mg/dL (ref 1.7–2.4)

## 2022-04-19 LAB — PHOSPHORUS: Phosphorus: 2.9 mg/dL (ref 2.5–4.6)

## 2022-04-19 LAB — CK: Total CK: 85 U/L (ref 38–234)

## 2022-04-19 MED ORDER — FAMOTIDINE 20 MG PO TABS
20.0000 mg | ORAL_TABLET | Freq: Every day | ORAL | Status: DC
  Administered 2022-04-20: 20 mg via ORAL
  Filled 2022-04-19: qty 1

## 2022-04-19 NOTE — Progress Notes (Signed)
Mobility Specialist - Progress Note   04/19/22 1528  Mobility  Activity Ambulated independently in hallway  Level of Assistance Independent  Assistive Device None  Distance Ambulated (ft) 500 ft  Activity Response Tolerated well  Mobility Referral Yes  $Mobility charge 1 Mobility   Pt was received in chair and agreeable to mobility. No complaints throughout session. Pt was returned to chair with all needs met.   Franki Monte  Mobility Specialist Please contact via Solicitor or Rehab office at [phone]

## 2022-04-19 NOTE — Progress Notes (Signed)
Physical Therapy Discharge Patient Details Name: Nicole Jimenez MRN: [id number] DOB: February 24, 1934 Today's Date: 04/19/2022 Time: 1001-1015 PT Time Calculation (min) (ACUTE ONLY): 14 min  Patient discharged from PT services secondary to goals met and no further PT needs identified.  Please see latest therapy progress note for current level of functioning and progress toward goals.    Progress and discharge plan discussed with patient and/or caregiver: Patient/Caregiver agrees with plan  GP     [name] 04/19/2022, 3:14 PM  [name],PT [place] [phone]

## 2022-04-19 NOTE — Plan of Care (Signed)
  Problem: Education: Goal: Knowledge of General Education information will improve Description: Including pain rating scale, medication(s)/side effects and non-pharmacologic comfort measures Outcome: Progressing   Problem: Nutrition: Goal: Adequate nutrition will be maintained Outcome: Progressing   Problem: Elimination: Goal: Will not experience complications related to bowel motility Outcome: Progressing   Problem: Safety: Goal: Ability to remain free from injury will improve Outcome: Progressing   

## 2022-04-19 NOTE — Consult Note (Signed)
[facility] Renal Consultation Note  Requesting MD: Candiss Norse  Indication for Consultation: AKI  HPI:  Nicole Jimenez is a 88 y.o. female with past medical history significant for DM, Afib, was on zestoretic as OP as well as glucophage.  Crt in September of 23 was 1.08. She saw her PCP on 2/26-  was noted to have weight loss and be hypotensive-  her meds were decreased but she remained on some zestoretic as well as atenolol-  crt was 2.69 at that time and she also had an elevated amylase concerning for pancreatitis-  she returned to see her PCP on 3/4-  crt was 3.87.  she was admitted-  ACE held and she was given fluids-  crt peaked at 4.2 but has steadily decreased to 3.  Urine bland-  had u/s today - smallish kidneys but echogenicity normal- no obstruction.  UOP seems fine-  even high at over 3 liters. BP has been fine on cardizem and also lasix starting today - CK low at 85.  She is very nervous-  appears younger than 30-  sitting in bedside chair  Creat  Date/Time Value Ref Range Status  01/24/2017 04:21 PM 0.95 (H) 0.60 - 0.88 mg/dL Final    Comment:    For patients >28 years of age, the reference limit for Creatinine is approximately 13% higher for people identified as African-American. .    Creatinine, Ser  Date/Time Value Ref Range Status  04/19/2022 03:19 AM 3.05 (H) 0.44 - 1.00 mg/dL Final  04/18/2022 04:42 AM 3.02 (H) 0.44 - 1.00 mg/dL Final  04/17/2022 04:41 AM 3.36 (H) 0.44 - 1.00 mg/dL Final  04/16/2022 03:48 PM 3.59 (H) 0.44 - 1.00 mg/dL Final  04/16/2022 05:35 AM 3.70 (H) 0.44 - 1.00 mg/dL Final  04/15/2022 05:33 AM 3.91 (H) 0.44 - 1.00 mg/dL Final  04/14/2022 07:39 AM 4.21 (H) 0.44 - 1.00 mg/dL Final  04/13/2022 09:15 AM 4.17 (H) 0.44 - 1.00 mg/dL Final  04/12/2022 12:02 PM 3.87 (H) 0.40 - 1.20 mg/dL Final  04/05/2022 04:54 PM 2.69 (H) 0.40 - 1.20 mg/dL Final  10/23/2021 01:47 PM 1.08 0.40 - 1.20 mg/dL Final  04/17/2021 12:11 PM 1.06 0.40 - 1.20 mg/dL  Final  05/19/2020 01:28 PM 0.98 0.40 - 1.20 mg/dL Final  02/27/2019 02:24 PM 1.06 0.40 - 1.20 mg/dL Final  02/24/2018 01:53 PM 1.04 0.40 - 1.20 mg/dL Final  11/18/2017 11:14 AM 0.81 0.40 - 1.20 mg/dL Final     PMHx:   Past Medical History:  Diagnosis Date   Arthritis    Depression    Diabetes mellitus without complication (HCC)    Borderline   Diastolic CHF ([place]) 123456   Echo 11/2016, nl EF but LVH and diastolic dysfunction   GERD (gastroesophageal reflux disease)    Hyperlipidemia    Hypertension    Peripheral arterial disease ([place]) 2018   thromboembolism of femoral artery   Thyroid disease    subclinical hyperthyroidism managed by endocrinology    Past Surgical History:  Procedure Laterality Date   FEMORAL ENDARTERECTOMY Right 11/29/2016    Family Hx:  Family History  Problem Relation Age of Onset   Heart attack Mother    Arthritis Mother    Diabetes Father    Heart attack Father    Hyperlipidemia Father    Arthritis Sister    Diabetes Sister        adopted   Healthy Daughter    Healthy Son    Healthy Son  Other Son        cyst on thyroid    Social History:  reports that she has never smoked. She has never used smokeless tobacco. She reports that she does not drink alcohol and does not use drugs.  Allergies:  Allergies  Allergen Reactions   Amoxicillin-Pot Clavulanate Other (See Comments)    unknown   Amoxicillin Other (See Comments)    unknown   Clarithromycin Other (See Comments)    unknown    Medications: Prior to Admission medications   Medication Sig Start Date End Date Taking? Authorizing Provider  Acetaminophen 500 MG coapsule Take 1,000 mg by mouth every 6 (six) hours as needed for pain.   Yes [provider]  apixaban (ELIQUIS) 5 MG TABS tablet Take 1 tablet (5 mg total) by mouth 2 (two) times daily. 12/07/21  Yes Buford Dresser, MD  atenolol (TENORMIN) 50 MG tablet Take 1 tablet (50 mg total) by mouth daily.  04/05/22  Yes Leamon Arnt, MD  Calcium Carb-Cholecalciferol 600-800 MG-UNIT CHEW Chew 1 each by mouth daily.    Yes [provider]  diclofenac sodium (VOLTAREN) 1 % GEL APPLY 2 G TOPICALLY 4 (FOUR) TIMES DAILY AS NEEDED. **PA DENIED** Patient taking differently: Apply 2 g topically daily as needed (For pain). 11/01/17  Yes Leamon Arnt, MD  diltiazem (CARDIZEM CD) 180 MG 24 hr capsule TAKE 1 CAPSULE (180 MG TOTAL) BY MOUTH EVERY MORNING. Patient taking differently: Take 180 mg by mouth daily. 04/29/21  Yes Leamon Arnt, MD  Glucosamine-MSM-Hyaluronic Acd (JOINT HEALTH PO) Take 1 tablet by mouth daily.   Yes [provider]  Javier Docker Oil 500 MG CAPS Take 500 mg by mouth daily.   Yes [provider]  lisinopril-hydrochlorothiazide (ZESTORETIC) 10-12.5 MG tablet Take 1 tablet by mouth daily. 04/05/22  Yes Leamon Arnt, MD  metFORMIN (GLUCOPHAGE-XR) 500 MG 24 hr tablet Take 2 tablets (1,000 mg total) by mouth daily with breakfast. 07/17/21  Yes Leamon Arnt, MD  Multiple Vitamins-Minerals (MULTIVITAMIN ADULT PO) Take by mouth.   Yes [provider]  omeprazole (PRILOSEC) 20 MG capsule Take 1 capsule (20 mg total) by mouth daily. 04/05/22  Yes Leamon Arnt, MD  rosuvastatin (CRESTOR) 40 MG tablet Take 1 tablet (40 mg total) by mouth daily. 12/15/21  Yes Leamon Arnt, MD  vortioxetine HBr (TRINTELLIX) 10 MG TABS tablet Take 1 tablet (10 mg total) by mouth daily. 11/13/21  Yes Leamon Arnt, MD  glucose blood [facility] ULTRA) test strip USE TWICE DAILY TO CHECK BLOOD SUGAR - E11.40 04/04/20   Leamon Arnt, MD  [facility] ULTRA test strip USE AS INSTRUCTED 06/23/21   Leamon Arnt, MD    I have reviewed the patient's current medications.  Labs:  Results for orders placed or performed during the hospital encounter of 04/12/22 (from the past 48 hour(s))  Glucose, capillary     Status: Abnormal   Collection Time: 04/17/22  4:09 PM  Result Value Ref Range    Glucose-Capillary 200 (H) 70 - 99 mg/dL    Comment: Glucose reference range applies only to samples taken after fasting for at least 8 hours.  Glucose, capillary     Status: Abnormal   Collection Time: 04/17/22  9:27 PM  Result Value Ref Range   Glucose-Capillary 200 (H) 70 - 99 mg/dL    Comment: Glucose reference range applies only to samples taken after fasting for at least 8 hours.  Basic metabolic  panel     Status: Abnormal   Collection Time: 04/18/22  4:42 AM  Result Value Ref Range   Sodium 140 135 - 145 mmol/L   Potassium 4.1 3.5 - 5.1 mmol/L   Chloride 100 98 - 111 mmol/L   CO2 28 22 - 32 mmol/L   Glucose, Bld 111 (H) 70 - 99 mg/dL    Comment: Glucose reference range applies only to samples taken after fasting for at least 8 hours.   BUN 27 (H) 8 - 23 mg/dL   Creatinine, Ser 3.02 (H) 0.44 - 1.00 mg/dL   Calcium 8.7 (L) 8.9 - 10.3 mg/dL   GFR, Estimated 15 (L) >60 mL/min    Comment: (NOTE) Calculated using the CKD-EPI Creatinine Equation (2021)    Anion gap 12 5 - 15    Comment: Performed at [facility] [address], [place], [place] [address]  CBC     Status: Abnormal   Collection Time: 04/18/22  4:42 AM  Result Value Ref Range   WBC 5.4 4.0 - 10.5 K/uL   RBC 3.52 (L) 3.87 - 5.11 MIL/uL   Hemoglobin 10.6 (L) 12.0 - 15.0 g/dL   HCT 30.9 (L) 36.0 - 46.0 %   MCV 87.8 80.0 - 100.0 fL   MCH 30.1 26.0 - 34.0 pg   MCHC 34.3 30.0 - 36.0 g/dL   RDW 12.9 11.5 - 15.5 %   Platelets 99 (L) 150 - 400 K/uL    Comment: Immature Platelet Fraction may be clinically indicated, consider ordering this additional test [id number]    nRBC 0.0 0.0 - 0.2 %    Comment: Performed at [facility], [place] [address], [place], [place] [address]  Magnesium     Status: Abnormal   Collection Time: 04/18/22  4:42 AM  Result Value Ref Range   Magnesium 1.6 (L) 1.7 - 2.4 mg/dL    Comment: Performed at [facility] [address], [place], [place] [address]  Phosphorus      Status: None   Collection Time: 04/18/22  4:42 AM  Result Value Ref Range   Phosphorus 2.5 2.5 - 4.6 mg/dL    Comment: Performed at [facility] [address], [place], [place] [address]  Glucose, capillary     Status: Abnormal   Collection Time: 04/18/22  7:44 AM  Result Value Ref Range   Glucose-Capillary 123 (H) 70 - 99 mg/dL    Comment: Glucose reference range applies only to samples taken after fasting for at least 8 hours.  Glucose, capillary     Status: Abnormal   Collection Time: 04/18/22 11:59 AM  Result Value Ref Range   Glucose-Capillary 208 (H) 70 - 99 mg/dL    Comment: Glucose reference range applies only to samples taken after fasting for at least 8 hours.  Glucose, capillary     Status: Abnormal   Collection Time: 04/18/22  4:40 PM  Result Value Ref Range   Glucose-Capillary 146 (H) 70 - 99 mg/dL    Comment: Glucose reference range applies only to samples taken after fasting for at least 8 hours.  Glucose, capillary     Status: Abnormal   Collection Time: 04/18/22  8:17 PM  Result Value Ref Range   Glucose-Capillary 249 (H) 70 - 99 mg/dL    Comment: Glucose reference range applies only to samples taken after fasting for at least 8 hours.  CBC with Differential/Platelet     Status: Abnormal  Collection Time: 04/19/22  3:19 AM  Result Value Ref Range   WBC 5.5 4.0 - 10.5 K/uL   RBC 3.77 (L) 3.87 - 5.11 MIL/uL   Hemoglobin 11.3 (L) 12.0 - 15.0 g/dL   HCT 33.8 (L) 36.0 - 46.0 %   MCV 89.7 80.0 - 100.0 fL   MCH 30.0 26.0 - 34.0 pg   MCHC 33.4 30.0 - 36.0 g/dL   RDW 13.0 11.5 - 15.5 %   Platelets 118 (L) 150 - 400 K/uL    Comment: REPEATED TO VERIFY   nRBC 0.0 0.0 - 0.2 %   Neutrophils Relative % 58 %   Neutro Abs 3.1 1.7 - 7.7 K/uL   Lymphocytes Relative 24 %   Lymphs Abs 1.3 0.7 - 4.0 K/uL   Monocytes Relative 13 %   Monocytes Absolute 0.7 0.1 - 1.0 K/uL   Eosinophils Relative 5 %   Eosinophils Absolute 0.3 0.0 - 0.5 K/uL   Basophils Relative 0 %    Basophils Absolute 0.0 0.0 - 0.1 K/uL   Immature Granulocytes 0 %   Abs Immature Granulocytes 0.01 0.00 - 0.07 K/uL    Comment: Performed at [facility], [address] [place], [place] [address]  Comprehensive metabolic panel     Status: Abnormal   Collection Time: 04/19/22  3:19 AM  Result Value Ref Range   Sodium 139 135 - 145 mmol/L   Potassium 4.2 3.5 - 5.1 mmol/L   Chloride 96 (L) 98 - 111 mmol/L   CO2 32 22 - 32 mmol/L   Glucose, Bld 113 (H) 70 - 99 mg/dL    Comment: Glucose reference range applies only to samples taken after fasting for at least 8 hours.   BUN 29 (H) 8 - 23 mg/dL   Creatinine, Ser 3.05 (H) 0.44 - 1.00 mg/dL   Calcium 9.2 8.9 - 10.3 mg/dL   Total Protein 7.1 6.5 - 8.1 g/dL   Albumin 3.0 (L) 3.5 - 5.0 g/dL   AST 25 15 - 41 U/L   ALT 21 0 - 44 U/L   Alkaline Phosphatase 53 38 - 126 U/L   Total Bilirubin 1.0 0.3 - 1.2 mg/dL   GFR, Estimated 14 (L) >60 mL/min    Comment: (NOTE) Calculated using the CKD-EPI Creatinine Equation (2021)    Anion gap 11 5 - 15    Comment: Performed at [facility] [address], [place], [place] [address]  Magnesium     Status: None   Collection Time: 04/19/22  3:19 AM  Result Value Ref Range   Magnesium 1.7 1.7 - 2.4 mg/dL    Comment: Performed at [facility] [address], [place], [place] [address]  Phosphorus     Status: None   Collection Time: 04/19/22  3:19 AM  Result Value Ref Range   Phosphorus 2.9 2.5 - 4.6 mg/dL    Comment: Performed at [facility] [address], [place], [place] [address]  CK     Status: None   Collection Time: 04/19/22  3:19 AM  Result Value Ref Range   Total CK 85 38 - 234 U/L    Comment: Performed at [facility], [place] [address], [place], [place] [address]  Glucose, capillary     Status: Abnormal   Collection Time: 04/19/22  8:34 AM  Result Value Ref Range   Glucose-Capillary 148 (H) 70 - 99 mg/dL    Comment: Glucose reference range applies  only to  samples taken after fasting for at least 8 hours.  Glucose, capillary     Status: Abnormal   Collection Time: 04/19/22 11:40 AM  Result Value Ref Range   Glucose-Capillary 216 (H) 70 - 99 mg/dL    Comment: Glucose reference range applies only to samples taken after fasting for at least 8 hours.     ROS:  A comprehensive review of systems was negative except for: Cardiovascular: positive for lower extremity edema  Physical Exam: Vitals:   04/19/22 0606 04/19/22 0746  BP: 133/66 (!) 144/68  Pulse: 76 68  Resp: 18 18  Temp: 98.3 F (36.8 C) 98 F (36.7 C)  SpO2: 99% 100%     General: appears younger than age 40-  NAD-  sitting in bedside chair-  appears nervous HEENT: PERRLA, EOMI, mucous membranes moist Neck: no JVD Heart: RRR Lungs: mostly clear Abdomen: soft, non tender Extremities: trace LE edema Skin: warm and dry Neuro: globally intact  Assessment/Plan: 88 year old female with AKI /ATN in the setting of low BP on zestoretic that had been going on for at least 8 days prior to admit 1.Renal- AKI that appears to be consistent with ATN-  started to see signs of this 7 days PTA so was put into motion prior to that.  Because urine is bland and u/s is unremarkable I am not suspicious of anything more exotic than ATN going on here.  It is improving -  it is just slow because it had already been going on for several days prior to admit. Thus established ATN and due to her age is going to take more time to resolve.  The fact that UOP is more seems like a post injury diuresis-  a good sign.  I am going to stop the lasix as I feel she will autdiurese and I do not want to hamper recover further.  If kidney function stays same or improves tomorrow I would not have a problem with discharge and watching as OP by her PCP-  would not resume antihypertensives at this time.  I dont think she needs nephrology specific follow up unless it does not completely resolve in a month  2.  Hypertension/volume  - just a little edema due to situation-  feel it will resolve as kidney function improves-  I dont think needs lasix 3. Anemia  - not a major issue    Louis Meckel 04/19/2022, 1:06 PM
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
## 2022-04-19 NOTE — Progress Notes (Signed)
Physical Therapy Treatment and D/C Patient Details Name: Nicole Jimenez MRN: [id number] DOB: Sep 24, 1934 Today's Date: 04/19/2022   History of Present Illness 88 yo female presents to [facility] from [place] office for AKI in setting of decreased PO intake and recent mild pancreatitis. PMH includes diabetes, atrial fibrillation, depression, hyperlipidemia, hypertension, diastolic CHF, Sjogren's syndrome, subclinical hypothyroidism.    PT Comments    Pt admitted with above diagnosis. Pt was able to ambulate without LOB and incr distance. Scored 19/24 on DGI suggesting low risk of falls.   Pt able to self correct balance.  Pt appears at baseline and is ambulating on her own. Met all goals except declined need to practice steps.  Mobility team can continue to follow pt however she no longer needs skilled PT.  Will sign off.        Recommendations for follow up therapy are one component of a multi-disciplinary discharge planning process, led by the attending physician.  Recommendations may be updated based on patient status, additional functional criteria and insurance authorization.  Follow Up Recommendations  No PT follow up     Assistance Recommended at Discharge PRN  Patient can return home with the following     Equipment Recommendations  None recommended by PT    Recommendations for Other Services       Precautions / Restrictions Precautions Precautions: None Restrictions Weight Bearing Restrictions: No     Mobility  Bed Mobility               General bed mobility comments: up in chair    Transfers Overall transfer level: Independent                      Ambulation/Gait Ambulation/Gait assistance: Independent Gait Distance (Feet): 550 Feet Assistive device: None Gait Pattern/deviations: Step-through pattern, Decreased stride length, Drifts right/left Gait velocity: decr Gait velocity interpretation: <1.31 ft/sec, indicative of household ambulator   General  Gait Details: Pt can withstand challenges to balance.  Pt able to self correct balance when slightly unsteady path.   Stairs             Wheelchair Mobility    Modified Rankin (Stroke Patients Only)       Balance Overall balance assessment: Needs assistance Sitting-balance support: No upper extremity supported, Feet supported Sitting balance-Leahy Scale: Good     Standing balance support: No upper extremity supported, During functional activity Standing balance-Leahy Scale: Good Standing balance comment: no LOB and pt-corrected                 Standardized Balance Assessment Standardized Balance Assessment : Dynamic Gait Index   Dynamic Gait Index Level Surface: Normal Change in Gait Speed: Mild Impairment Gait with Horizontal Head Turns: Mild Impairment Gait with Vertical Head Turns: Normal Gait and Pivot Turn: Mild Impairment Step Over Obstacle: Mild Impairment Step Around Obstacles: Normal Steps: Mild Impairment Total Score: 19      Cognition Arousal/Alertness: Awake/alert Behavior During Therapy: WFL for tasks assessed/performed Overall Cognitive Status: Within Functional Limits for tasks assessed                                          Exercises      General Comments        Pertinent Vitals/Pain Pain Assessment Pain Assessment: No/denies pain    Home Living  Prior Function            PT Goals (current goals can now be found in the care plan section) Acute Rehab PT Goals Patient Stated Goal: go home PT Goal Formulation: All assessment and education complete, DC therapy Potential to Achieve Goals: Good Progress towards PT goals: Goals met/education completed, patient discharged from PT    Frequency    Min 3X/week      PT Plan Current plan remains appropriate    Co-evaluation              AM-PAC PT "6 Clicks" Mobility   Outcome Measure  Help needed turning from  your back to your side while in a flat bed without using bedrails?: None Help needed moving from lying on your back to sitting on the side of a flat bed without using bedrails?: None Help needed moving to and from a bed to a chair (including a wheelchair)?: None Help needed standing up from a chair using your arms (e.g., wheelchair or bedside chair)?: None Help needed to walk in hospital room?: A Little Help needed climbing 3-5 steps with a railing? : A Little 6 Click Score: 22    End of Session Equipment Utilized During Treatment: Gait belt Activity Tolerance: Patient tolerated treatment well Patient left: in chair;with call bell/phone within reach;Other (comment) (pt up in chair without alarm upon PT arrival to room, pt states staff lets her mobilize ad lib) Nurse Communication: Mobility status PT Visit Diagnosis: Other abnormalities of gait and mobility (R26.89)     Time: 1001-1015 PT Time Calculation (min) (ACUTE ONLY): 14 min  Charges:  $Gait Training: 8-22 mins                     Nayda Riesen M,PT Acute Rehab Services [place] 04/19/2022, 1:22 PM

## 2022-04-20 ENCOUNTER — Telehealth: Payer: Self-pay | Admitting: Family Medicine

## 2022-04-20 LAB — RENAL FUNCTION PANEL
Albumin: 3 g/dL — ABNORMAL LOW (ref 3.5–5.0)
Anion gap: 11 (ref 5–15)
BUN: 31 mg/dL — ABNORMAL HIGH (ref 8–23)
CO2: 29 mmol/L (ref 22–32)
Calcium: 8.8 mg/dL — ABNORMAL LOW (ref 8.9–10.3)
Chloride: 94 mmol/L — ABNORMAL LOW (ref 98–111)
Creatinine, Ser: 2.75 mg/dL — ABNORMAL HIGH (ref 0.44–1.00)
GFR, Estimated: 16 mL/min — ABNORMAL LOW (ref >60)
Glucose, Bld: 101 mg/dL — ABNORMAL HIGH (ref 70–99)
Phosphorus: 3.7 mg/dL (ref 2.5–4.6)
Potassium: 2.9 mmol/L — ABNORMAL LOW (ref 3.5–5.1)
Sodium: 134 mmol/L — ABNORMAL LOW (ref 135–145)

## 2022-04-20 LAB — GLUCOSE, CAPILLARY: Glucose-Capillary: 138 mg/dL — ABNORMAL HIGH (ref 70–99)

## 2022-04-20 MED ORDER — APIXABAN 2.5 MG PO TABS: 2.5000 mg | ORAL_TABLET | Freq: Two times a day (BID) | ORAL | 0 refills | Status: DC

## 2022-04-20 MED ORDER — LOPERAMIDE HCL 2 MG PO CAPS: 2.0000 mg | ORAL_CAPSULE | ORAL | 0 refills | Status: DC | PRN

## 2022-04-20 MED ORDER — POTASSIUM CHLORIDE CRYS ER 20 MEQ PO TBCR
40.0000 meq | EXTENDED_RELEASE_TABLET | Freq: Four times a day (QID) | ORAL | Status: DC
  Administered 2022-04-20: 40 meq via ORAL
  Filled 2022-04-20: qty 2

## 2022-04-20 MED ORDER — POTASSIUM CHLORIDE CRYS ER 20 MEQ PO TBCR: 20.0000 meq | EXTENDED_RELEASE_TABLET | Freq: Once | ORAL | Status: DC

## 2022-04-20 MED ORDER — FAMOTIDINE 20 MG PO TABS: 20.0000 mg | ORAL_TABLET | Freq: Every day | ORAL | 0 refills | Status: DC

## 2022-04-20 MED ORDER — POTASSIUM CHLORIDE CRYS ER 20 MEQ PO TBCR: 40.0000 meq | EXTENDED_RELEASE_TABLET | Freq: Once | ORAL | 0 refills | Status: DC

## 2022-04-20 NOTE — Telephone Encounter (Signed)
Patient states: -D/C from hospital today and told to have labs done within 1 day by pcp prior to hospital f/u on 04/30/22   PCP schedule has only same day slots, Please Advise.
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
## 2022-04-20 NOTE — Care Management Important Message (Signed)
Important Message  Patient Details  Name: Nicole Jimenez MRN: [id number] Date of Birth: 08-13-1934   Medicare Important Message Given:  Yes  Patient left prior to IM delivery mailing a copy to the patient home address   Orbie Pyo 04/20/2022, 1:58 PM

## 2022-04-20 NOTE — Discharge Instructions (Signed)
Follow with Primary MD Leamon Arnt, MD in 5-7 days   Get CBC, CMP, Magnesium -  checked next visit with your primary MD    Activity: As tolerated with Full fall precautions use walker/cane & assistance as needed  Disposition Home    Diet: Heart Healthy    Special Instructions: If you have smoked or chewed Tobacco  in the last 2 yrs please stop smoking, stop any regular Alcohol  and or any Recreational drug use.  On your next visit with your primary care physician please Get Medicines reviewed and adjusted.  Please request your Prim.MD to go over all Hospital Tests and Procedure/Radiological results at the follow up, please get all Hospital records sent to your Prim MD by signing hospital release before you go home.  If you experience worsening of your admission symptoms, develop shortness of breath, life threatening emergency, suicidal or homicidal thoughts you must seek medical attention immediately by calling 911 or calling your MD immediately  if symptoms less severe.  You Must read complete instructions/literature along with all the possible adverse reactions/side effects for all the Medicines you take and that have been prescribed to you. Take any new Medicines after you have completely understood and accpet all the possible adverse reactions/side effects.

## 2022-04-21 ENCOUNTER — Telehealth: Payer: Self-pay

## 2022-04-21 ENCOUNTER — Encounter: Payer: Self-pay | Admitting: Family Medicine

## 2022-04-21 ENCOUNTER — Ambulatory Visit (INDEPENDENT_AMBULATORY_CARE_PROVIDER_SITE_OTHER): Payer: Medicare Other | Admitting: Family Medicine

## 2022-04-21 VITALS — BP 150/80 | HR 90 | Temp 97.8°F | Ht 61.0 in | Wt 142.2 lb

## 2022-04-21 DIAGNOSIS — I4821 Permanent atrial fibrillation: Secondary | ICD-10-CM

## 2022-04-21 DIAGNOSIS — E876 Hypokalemia: Secondary | ICD-10-CM | POA: Diagnosis not present

## 2022-04-21 DIAGNOSIS — N170 Acute kidney failure with tubular necrosis: Secondary | ICD-10-CM

## 2022-04-21 DIAGNOSIS — I10 Essential (primary) hypertension: Secondary | ICD-10-CM | POA: Diagnosis not present

## 2022-04-21 DIAGNOSIS — I5032 Chronic diastolic (congestive) heart failure: Secondary | ICD-10-CM

## 2022-04-21 DIAGNOSIS — F339 Major depressive disorder, recurrent, unspecified: Secondary | ICD-10-CM

## 2022-04-21 DIAGNOSIS — N179 Acute kidney failure, unspecified: Secondary | ICD-10-CM

## 2022-04-21 NOTE — Patient Instructions (Signed)
Please follow up as scheduled for your next visit with me: 05/03/2022   If you have any questions or concerns, please don't hesitate to send me a message via MyChart or call the office at [phone]. Thank you for visiting with [place] today! It's our pleasure caring for you.

## 2022-04-21 NOTE — Progress Notes (Signed)
Subjective  CC:  Chief Complaint  Patient presents with   Hospitalization Follow-up    AKI, Admit date: 04/12/2022  Discharge date: 04/20/2022    HPI: Nicole Jimenez is a 88 y.o. female who presents to the office today to address the problems listed above in the chief complaint. 88 year old here for hospital follow-up, discharged yesterday.  I reviewed all hospital notes, records, consult notes and lab work.  She also had CT scan and abdominal ultrasound that I reviewed.  To summarize, we admitted her after findings of worsening acute kidney injury.  This was after a prolonged course of decreased appetite, worsening depression and symptoms of hypotension.  Hospital course was significant for recovering renal function with IV fluids.  Likely had acute tubular necrosis due to dehydration and ACE inhibitor's/ hyopotension.  All kidney affecting medications had been held, thiazide diuretic, ACE inhibitor and metformin.  Her hospital course was mostly unremarkable with good response albeit slow to IV fluids.  Nephrology was consulted.  Recommendations including holding nephrotoxins, monitoring blood pressure and continuing good hydration.  At this time patient is feeling stable.  Trying to drink plenty of fluid.  Still making good urine.  Blood pressures are trending upwards since stopping hydrochlorothiazide and ACE inhibitor. Permanent A-fib: We did decrease the Eliquis dose for her renal function. Major depression: Persists perhaps mildly better now back on Trintellix. She was hypokalemic at discharge.  Oral supplements have been given.  Assessment  1. ATN (acute tubular necrosis) (HCC)   2. AKI (acute kidney injury) (HCC)   3. Essential hypertension   4. Chronic diastolic CHF (congestive heart failure) (HCC)   5. Permanent atrial fibrillation (HCC)   6. Major depression, recurrent, chronic (HCC)   7. Hypokalemia      Plan  ATN/AKI: Continue to hold nephrotoxins and monitor for full  recovery.  Will recheck potassium level and renal function today.  Then again in 2 weeks.  Education given. Hypertension: Want to avoid hypotension in the setting of ATN.  Continue calcium channel blocker and monitoring.  Will adjust medications in 2 weeks if remains hypertensive A-fib on renal dose Eliquis.  Monitor and titrate dose back up once kidney function recovers Depression: Counseling done.  Continue Trintellix Recheck potassium and supplement if needed  Follow up: As scheduled 05/03/2022  Orders Placed This Encounter  Procedures   Renal function panel   No orders of the defined types were placed in this encounter.     I reviewed the patients updated PMH, FH, and SocHx.    Patient Active Problem List   Diagnosis Date Noted   History of arterial embolism 08/21/2018    Priority: High   Anticoagulant long-term use 03/06/2018    Priority: High   Permanent atrial fibrillation (HCC) 01/23/2018    Priority: High   Chronic diastolic CHF (congestive heart failure) (HCC) 03/03/2017    Priority: High   Type 2 diabetes mellitus with peripheral neuropathy (HCC) 01/24/2017    Priority: High   Subclinical hyperthyroidism 01/21/2014    Priority: High   Essential hypertension 02/23/2013    Priority: High   Combined hyperlipidemia associated with type 2 diabetes mellitus (HCC) 02/23/2013    Priority: High   Major depression, recurrent, chronic (HCC) 07/18/2012    Priority: High   DJD of right shoulder 01/24/2017    Priority: Medium    Bereavement due to life event 03/05/2016    Priority: Medium    Osteopenia 08/23/2015    Priority: Medium  Idiopathic scoliosis of thoracic spine 07/09/2014    Priority: Medium    Sjogren's syndrome (HCC) 08/04/2012    Priority: Medium    DDD (degenerative disc disease), lumbosacral 07/15/2012    Priority: Medium    Lumbosacral spondylosis 07/15/2012    Priority: Medium    Osteoarthritis of knee 07/15/2012    Priority: Medium    Bilateral  edema of lower extremity 01/26/2017    Priority: Low   Bilateral hip bursitis 07/17/2015    Priority: Low   Age-related nuclear cataract of both eyes 03/11/2015    Priority: Low   Insomnia 09/04/2013    Priority: Low   Allergic rhinitis 07/15/2012    Priority: Low   AKI (acute kidney injury) (HCC) 04/12/2022   Current Meds  Medication Sig   Acetaminophen 500 MG coapsule Take 1,000 mg by mouth every 6 (six) hours as needed for pain.   apixaban (ELIQUIS) 2.5 MG TABS tablet Take 1 tablet (2.5 mg total) by mouth 2 (two) times daily.   Calcium Carb-Cholecalciferol 600-800 MG-UNIT CHEW Chew 1 each by mouth daily.    diclofenac sodium (VOLTAREN) 1 % GEL APPLY 2 G TOPICALLY 4 (FOUR) TIMES DAILY AS NEEDED. **PA DENIED** (Patient taking differently: Apply 2 g topically daily as needed (For pain).)   diltiazem (CARDIZEM CD) 180 MG 24 hr capsule TAKE 1 CAPSULE (180 MG TOTAL) BY MOUTH EVERY MORNING. (Patient taking differently: Take 180 mg by mouth daily.)   famotidine (PEPCID) 20 MG tablet Take 1 tablet (20 mg total) by mouth daily.   Glucosamine-MSM-Hyaluronic Acd (JOINT HEALTH PO) Take 1 tablet by mouth daily.   glucose blood (ONETOUCH ULTRA) test strip USE TWICE DAILY TO CHECK BLOOD SUGAR - E11.40   Krill Oil 500 MG CAPS Take 500 mg by mouth daily.   loperamide (IMODIUM) 2 MG capsule Take 1 capsule (2 mg total) by mouth as needed for diarrhea or loose stools.   Multiple Vitamins-Minerals (MULTIVITAMIN ADULT PO) Take by mouth.   ONETOUCH ULTRA test strip USE AS INSTRUCTED   rosuvastatin (CRESTOR) 40 MG tablet Take 1 tablet (40 mg total) by mouth daily.   vortioxetine HBr (TRINTELLIX) 10 MG TABS tablet Take 1 tablet (10 mg total) by mouth daily.   [DISCONTINUED] rosuvastatin (CRESTOR) 40 MG tablet Take 40 mg by mouth daily.    Allergies: Patient is allergic to amoxicillin-pot clavulanate, amoxicillin, and clarithromycin. Family History: Patient family history includes Arthritis in her mother  and sister; Diabetes in her father and sister; Healthy in her daughter, son, and son; Heart attack in her father and mother; Hyperlipidemia in her father; Other in her son. Social History:  Patient  reports that she has never smoked. She has never used smokeless tobacco. She reports that she does not drink alcohol and does not use drugs.  Review of Systems: Constitutional: Negative for fever malaise or anorexia Cardiovascular: negative for chest pain Respiratory: negative for SOB or persistent cough Gastrointestinal: negative for abdominal pain  Objective  Vitals: BP (!) 150/80   Pulse 90   Temp 97.8 F (36.6 C)   Ht 5\' 1"  (1.549 m)   Wt 142 lb 3.2 oz (64.5 kg)   SpO2 92%   BMI 26.87 kg/m  General: no acute distress , A&Ox3, tearful at times HEENT: PEERL, conjunctiva normal, neck is supple Cardiovascular:  RRR without murmur or gallop.  Respiratory:  Good breath sounds bilaterally, CTAB with normal respiratory effort Skin:  Warm, no rashes  Commons side effects, risks, benefits, and alternatives  for medications and treatment plan prescribed today were discussed, and the patient expressed understanding of the given instructions. Patient is instructed to call or message via MyChart if he/she has any questions or concerns regarding our treatment plan. No barriers to understanding were identified. We discussed Red Flag symptoms and signs in detail. Patient expressed understanding regarding what to do in case of urgent or emergency type symptoms.  Medication list was reconciled, printed and provided to the patient in AVS. Patient instructions and summary information was reviewed with the patient as documented in the AVS. This note was prepared with assistance of Dragon voice recognition software. Occasional wrong-word or sound-a-like substitutions may have occurred due to the inherent limitations of voice recognition software

## 2022-04-21 NOTE — Transitions of Care (Post Inpatient/ED Visit) (Signed)
   04/21/2022  Name: Nicole Jimenez MRN: [id number] DOB: 06/29/1934  Today's TOC FU Call Status: Today's TOC FU Call Status:: Successful TOC FU Call Competed TOC FU Call Complete Date: 04/21/22 (Incoming call from patient returning RN CM call.)  Transition Care Management Follow-up Telephone Call Date of Discharge: 04/20/22 Discharge [facility]: [facility]) Type of Discharge: Inpatient Admission Primary Inpatient Discharge Diagnosis:: "AKI" How have you been since you were released from the hospital?: Better (Patient states she thinsk she is "doing well." She has had no new issues or concerns since returning home. She rested well. She has not ate yet but will be eating a small breakfast shortly.) Any questions or concerns?: No  Items Reviewed: Did you receive and understand the discharge instructions provided?: Yes Medications obtained and verified?: No (Patient declined med review-headed to MD follow up soon and will review meds with provider) Any new allergies since your discharge?: No Dietary orders reviewed?: Yes Type of Diet Ordered:: low salt/heart healthy Do you have support at home?: Yes People in Home: child(ren), adult Name of Support/Comfort Primary Source: [place] and Equipment/Supplies: [place] Ordered?: NA Any new equipment or medical supplies ordered?: NA  Functional Questionnaire: Do you need assistance with bathing/showering or dressing?: No Do you need assistance with meal preparation?: No Do you need assistance with eating?: No Do you have difficulty maintaining continence: No Do you need assistance with getting out of bed/getting out of a chair/moving?: No Do you have difficulty managing or taking your medications?: No  Folllow up appointments reviewed: PCP Follow-up appointment confirmed?: Yes Date of PCP follow-up appointment?: 04/21/22 Follow-up Provider: Dr. Jonni Sanger Specialist [facility] Follow-up appointment confirmed?:  NA Do you need transportation to your follow-up appointment?: No Do you understand care options if your condition(s) worsen?: Yes-patient verbalized understanding  SDOH Interventions Today    Flowsheet Row Most Recent Value  SDOH Interventions   Food Insecurity Interventions Intervention Not Indicated  Transportation Interventions Intervention Not Indicated      TOC Interventions Today    Flowsheet Row Most Recent Value  TOC Interventions   TOC Interventions Discussed/Reviewed TOC Interventions Discussed      Interventions Today    Flowsheet Row Most Recent Value  Education Interventions   Education Provided Provided Education  Provided Verbal Education On Nutrition, Medication, When to see the doctor  Nutrition Interventions   Nutrition Discussed/Reviewed Nutrition Discussed, Decreasing salt  Pharmacy Interventions   Pharmacy Dicussed/Reviewed Pharmacy Topics Discussed  Safety Interventions   Safety Discussed/Reviewed Safety Discussed       Hetty Blend [facility] Care Management Care Management Community Coordinator Direct Phone: [phone] Toll Free: [phone] Fax: [phone]

## 2022-04-21 NOTE — Transitions of Care (Post Inpatient/ED Visit) (Signed)
   04/21/2022  Name: Nicole Jimenez MRN: [id number] DOB: 1934-02-22  Today's TOC FU Call Status: Today's TOC FU Call Status:: Unsuccessul Call (1st Attempt) Unsuccessful Call (1st Attempt) Date: 04/21/22  Attempted to reach the patient regarding the most recent Inpatient/ED visit.  Follow Up Plan: Additional outreach attempts will be made to reach the patient to complete the Transitions of Care (Post Inpatient/ED visit) call.     Enzo Montgomery, RN,BSN,CCM [facility] Care Management Care Management Community Coordinator Direct Phone: [phone] Toll Free: [phone] Fax: [phone]

## 2022-04-22 ENCOUNTER — Encounter: Payer: Self-pay | Admitting: Family Medicine

## 2022-04-22 LAB — RENAL FUNCTION PANEL
Albumin: 3.9 g/dL (ref 3.5–5.2)
BUN: 36 mg/dL — ABNORMAL HIGH (ref 6–23)
CO2: 28 mEq/L (ref 19–32)
Calcium: 9.6 mg/dL (ref 8.4–10.5)
Chloride: 94 mEq/L — ABNORMAL LOW (ref 96–112)
Creatinine, Ser: 2.45 mg/dL — ABNORMAL HIGH (ref 0.40–1.20)
GFR: 17.55 mL/min — ABNORMAL LOW (ref >60.00)
Glucose, Bld: 158 mg/dL — ABNORMAL HIGH (ref 70–99)
Phosphorus: 2.9 mg/dL (ref 2.3–4.6)
Potassium: 3.6 mEq/L (ref 3.5–5.1)
Sodium: 135 mEq/L (ref 135–145)

## 2022-04-22 NOTE — Progress Notes (Signed)
Please call patient: I have reviewed his/her lab results. Please let her know her kidney function continues to improve and her potassium is in the low normal range.  IF she has picked up the potassium pills (two '20mg'$  tabs) she may take them; however, if she hasn't, she doesn't need to go get them.  Keep well hydrated and her kidneys should continue to improve.

## 2022-04-25 ENCOUNTER — Encounter: Payer: Self-pay | Admitting: Family Medicine

## 2022-04-27 ENCOUNTER — Ambulatory Visit: Payer: Medicare Other | Admitting: Family Medicine

## 2022-04-30 ENCOUNTER — Ambulatory Visit: Payer: Medicare Other | Admitting: Family Medicine

## 2022-05-01 ENCOUNTER — Other Ambulatory Visit: Payer: Self-pay | Admitting: Family Medicine

## 2022-05-03 ENCOUNTER — Ambulatory Visit: Payer: Medicare Other | Admitting: Family Medicine

## 2022-05-04 ENCOUNTER — Encounter (HOSPITAL_BASED_OUTPATIENT_CLINIC_OR_DEPARTMENT_OTHER): Payer: Self-pay

## 2022-05-09 ENCOUNTER — Encounter (HOSPITAL_BASED_OUTPATIENT_CLINIC_OR_DEPARTMENT_OTHER): Payer: Self-pay

## 2022-05-09 ENCOUNTER — Emergency Department (HOSPITAL_BASED_OUTPATIENT_CLINIC_OR_DEPARTMENT_OTHER): Payer: Medicare Other

## 2022-05-09 ENCOUNTER — Emergency Department (HOSPITAL_BASED_OUTPATIENT_CLINIC_OR_DEPARTMENT_OTHER)
Admission: EM | Admit: 2022-05-09 | Discharge: 2022-05-09 | Disposition: A | Discharge status: Home / Self Care | Payer: Medicare Other | Attending: Emergency Medicine | Admitting: Emergency Medicine

## 2022-05-09 ENCOUNTER — Other Ambulatory Visit: Payer: Self-pay

## 2022-05-09 DIAGNOSIS — Z7901 Long term (current) use of anticoagulants: Secondary | ICD-10-CM | POA: Insufficient documentation

## 2022-05-09 DIAGNOSIS — R109 Unspecified abdominal pain: Secondary | ICD-10-CM | POA: Diagnosis not present

## 2022-05-09 DIAGNOSIS — I509 Heart failure, unspecified: Secondary | ICD-10-CM | POA: Insufficient documentation

## 2022-05-09 DIAGNOSIS — Z7984 Long term (current) use of oral hypoglycemic drugs: Secondary | ICD-10-CM | POA: Diagnosis not present

## 2022-05-09 DIAGNOSIS — E119 Type 2 diabetes mellitus without complications: Secondary | ICD-10-CM | POA: Insufficient documentation

## 2022-05-09 DIAGNOSIS — I110 Hypertensive heart disease with heart failure: Secondary | ICD-10-CM | POA: Insufficient documentation

## 2022-05-09 LAB — URINALYSIS, ROUTINE W REFLEX MICROSCOPIC
Bacteria, UA: NONE SEEN
Bilirubin Urine: NEGATIVE
Glucose, UA: NEGATIVE mg/dL
Ketones, ur: NEGATIVE mg/dL
Nitrite: NEGATIVE
Protein, ur: 30 mg/dL — AB
Specific Gravity, Urine: 1.008 (ref 1.005–1.030)
WBC, UA: >50 WBC/hpf (ref 0–5)
pH: 7 (ref 5.0–8.0)

## 2022-05-09 LAB — COMPREHENSIVE METABOLIC PANEL
ALT: 15 U/L (ref 0–44)
AST: 33 U/L (ref 15–41)
Albumin: 4 g/dL (ref 3.5–5.0)
Alkaline Phosphatase: 78 U/L (ref 38–126)
Anion gap: 13 (ref 5–15)
BUN: 26 mg/dL — ABNORMAL HIGH (ref 8–23)
CO2: 23 mmol/L (ref 22–32)
Calcium: 10.1 mg/dL (ref 8.9–10.3)
Chloride: 103 mmol/L (ref 98–111)
Creatinine, Ser: 1.71 mg/dL — ABNORMAL HIGH (ref 0.44–1.00)
GFR, Estimated: 29 mL/min — ABNORMAL LOW (ref >60)
Glucose, Bld: 119 mg/dL — ABNORMAL HIGH (ref 70–99)
Potassium: 3.6 mmol/L (ref 3.5–5.1)
Sodium: 139 mmol/L (ref 135–145)
Total Bilirubin: 0.7 mg/dL (ref 0.3–1.2)
Total Protein: 8.3 g/dL — ABNORMAL HIGH (ref 6.5–8.1)

## 2022-05-09 LAB — LIPASE, BLOOD: Lipase: 55 U/L — ABNORMAL HIGH (ref 11–51)

## 2022-05-09 LAB — CBC
HCT: 35.4 % — ABNORMAL LOW (ref 36.0–46.0)
Hemoglobin: 11.5 g/dL — ABNORMAL LOW (ref 12.0–15.0)
MCH: 30.1 pg (ref 26.0–34.0)
MCHC: 32.5 g/dL (ref 30.0–36.0)
MCV: 92.7 fL (ref 80.0–100.0)
Platelets: 182 10*3/uL (ref 150–400)
RBC: 3.82 MIL/uL — ABNORMAL LOW (ref 3.87–5.11)
RDW: 13.5 % (ref 11.5–15.5)
WBC: 6.4 10*3/uL (ref 4.0–10.5)
nRBC: 0 % (ref 0.0–0.2)

## 2022-05-09 NOTE — ED Provider Notes (Signed)
[facility] Provider Note   CSN: [id number] Arrival date & time: 05/09/22  1510     History  Chief Complaint  Patient presents with   Flank Pain    Nicole Jimenez is a 88 y.o. female with a past medical history of type 2 diabetes, CHF, recent admission for AKI and hypertension presenting today due to flank pain.  She reports that she has been having intermittent right flank pain over the past couple of days.  Recently discharged from the hospital so she wanted to make sure her kidneys were okay.  No dysuria, hematuria, frequency or urgency.  No fevers or chills.  No known history of kidney stones.  She does report that she has been having some trouble with moving her bowels regularly but does report that she had a bowel movement today.  She reports she has not been taking her Colace recently.    Flank Pain Pertinent negatives include no abdominal pain.       Home Medications Prior to Admission medications   Medication Sig Start Date End Date Taking? Authorizing Provider  Acetaminophen 500 MG coapsule Take 1,000 mg by mouth every 6 (six) hours as needed for pain.    [provider]  apixaban (ELIQUIS) 2.5 MG TABS tablet Take 1 tablet (2.5 mg total) by mouth 2 (two) times daily. 04/20/22   Thurnell Lose, MD  Calcium Carb-Cholecalciferol 600-800 MG-UNIT CHEW Chew 1 each by mouth daily.     [provider]  diclofenac sodium (VOLTAREN) 1 % GEL APPLY 2 G TOPICALLY 4 (FOUR) TIMES DAILY AS NEEDED. **PA DENIED** Patient taking differently: Apply 2 g topically daily as needed (For pain). 11/01/17   Leamon Arnt, MD  diltiazem (CARDIZEM CD) 180 MG 24 hr capsule TAKE 1 CAPSULE (180 MG TOTAL) BY MOUTH EVERY MORNING 05/03/22   Leamon Arnt, MD  famotidine (PEPCID) 20 MG tablet Take 1 tablet (20 mg total) by mouth daily. 04/20/22   Thurnell Lose, MD  Glucosamine-MSM-Hyaluronic Acd (JOINT HEALTH PO) Take 1 tablet by mouth  daily.    [provider]  glucose blood (ONETOUCH ULTRA) test strip USE TWICE DAILY TO CHECK BLOOD SUGAR - E11.40 04/04/20   Leamon Arnt, MD  Krill Oil 500 MG CAPS Take 500 mg by mouth daily.    [provider]  loperamide (IMODIUM) 2 MG capsule Take 1 capsule (2 mg total) by mouth as needed for diarrhea or loose stools. 04/20/22   Thurnell Lose, MD  Multiple Vitamins-Minerals (MULTIVITAMIN ADULT PO) Take by mouth.    [provider]  [facility] ULTRA test strip USE AS INSTRUCTED 06/23/21   Leamon Arnt, MD  potassium chloride SA (KLOR-CON M) 20 MEQ tablet Take 2 tablets (40 mEq total) by mouth once for 1 dose. 04/20/22 04/20/22  Thurnell Lose, MD  rosuvastatin (CRESTOR) 40 MG tablet Take 1 tablet (40 mg total) by mouth daily. 12/15/21   Leamon Arnt, MD  vortioxetine HBr (TRINTELLIX) 10 MG TABS tablet Take 1 tablet (10 mg total) by mouth daily. 11/13/21   Leamon Arnt, MD      Allergies    Amoxicillin-pot clavulanate, Amoxicillin, and Clarithromycin    Review of Systems   Review of Systems  Constitutional:  Negative for chills and fever.  Gastrointestinal:  Positive for constipation. Negative for abdominal pain, diarrhea, nausea and vomiting.  Genitourinary:  Positive for flank pain. Negative for decreased urine volume, difficulty  urinating, dysuria, frequency and hematuria.    Physical Exam Updated Vital Signs BP (!) 162/88   Pulse 96   Temp (!) 97.3 F (36.3 C)   Resp 18   Ht 5\' 1"  (1.549 m)   Wt 64.5 kg   SpO2 100%   BMI 26.87 kg/m  Physical Exam Vitals and nursing note reviewed.  Constitutional:      Appearance: Normal appearance.  HENT:     Head: Normocephalic and atraumatic.  Eyes:     General: No scleral icterus.    Conjunctiva/sclera: Conjunctivae normal.  Pulmonary:     Effort: Pulmonary effort is normal. No respiratory distress.  Abdominal:     General: Abdomen is flat.     Palpations: Abdomen is soft.     Tenderness:  There is no abdominal tenderness. There is no right CVA tenderness or left CVA tenderness.  Musculoskeletal:     Comments: Some reproducible tenderness when patient rotates the T-spine to the right  Skin:    General: Skin is warm and dry.     Findings: No rash.  Neurological:     Mental Status: She is alert.  Psychiatric:        Mood and Affect: Mood normal.     ED Results / Procedures / Treatments   Labs (all labs ordered are listed, but only abnormal results are displayed) Labs Reviewed  CBC - Abnormal; Notable for the following components:      Result Value   RBC 3.82 (*)    Hemoglobin 11.5 (*)    HCT 35.4 (*)    All other components within normal limits  LIPASE, BLOOD  COMPREHENSIVE METABOLIC PANEL  URINALYSIS, ROUTINE W REFLEX MICROSCOPIC    EKG None  Radiology CT Renal Stone Study  Result Date: 05/09/2022 CLINICAL DATA:  Abdominal/flank pain. Stone suspected. Right-sided symptoms over the last week. EXAM: CT ABDOMEN AND PELVIS WITHOUT CONTRAST TECHNIQUE: Multidetector CT imaging of the abdomen and pelvis was performed following the standard protocol without IV contrast. RADIATION DOSE REDUCTION: This exam was performed according to the departmental dose-optimization program which includes automated exposure control, adjustment of the mA and/or kV according to patient size and/or use of iterative reconstruction technique. COMPARISON:  04/14/2022 FINDINGS: Lower chest: Cardiomegaly. Resolution of previously seen right effusion. Mild scarring at the lung bases. Hepatobiliary: Liver parenchyma is normal without contrast. Small stones dependent within the gallbladder as seen previously. No CT evidence of cholecystitis or obstruction. Pancreas: Normal Spleen: Normal Adrenals/Urinary Tract: Adrenal glands are normal. Both kidneys are normal by CT. No evidence of stone or obstruction. Low volume bladder appears unremarkable. Stomach/Bowel: Stomach and small intestine are normal.  Normal appendix. No abnormal colon finding. Amount of fecal matter within the range of normal. Vascular/Lymphatic: Aortic atherosclerosis. No aneurysm. IVC is normal. No adenopathy. Reproductive: Multiple calcified leiomyomas. No acute pelvic finding. Other: No free fluid or air. Musculoskeletal: Chronic lumbar degenerative changes. IMPRESSION: 1. No acute finding to explain the clinical presentation. No evidence of urinary tract stone disease or obstruction. 2. Chololithiasis without CT evidence of cholecystitis or obstruction. 3. Cardiomegaly. Resolution of previously seen right effusion. Aortic Atherosclerosis (ICD10-I70.0). Electronically Signed   By: Nelson Chimes M.D.   On: 05/09/2022 16:47    Procedures Procedures   Medications Ordered in ED Medications - No data to display  ED Course/ Medical Decision Making/ A&P  Medical Decision Making Amount and/or Complexity of Data Reviewed Labs: ordered. Radiology: ordered.   88 year old female presenting today with right flank pain.  Differential includes but is not limited to nephrolithiasis, pyelonephritis, UTI, muscle strain, cholecystitis, renal infarct  This is not an exhaustive differential.    Past Medical History / Co-morbidities / Social History: Hypertension, CHF, paroxysmal A-fib, recently discharged from the hospital due to acute tubular necrosis   Additional history: I viewed patient's hospitalization.  Per chart review patient was directly admitted to the hospital by her PCP on March 4 due to pancreatitis and an AKI.  The AKI was thought to be prerenal in the setting of poor p.o. intake due to pancreatitis.  During her admission she was seen by nephrology.  In September 2023 her creatinine was 1.08 and when she was admitted it was 4.2.    AKI originally presumed to be prerenal however nephrology believed her AKI to be due to ATN.  Her antihypertensives were held due to nephrotoxicity.  Most recent  outpatient creatinine with PCP was 2.45, improved from 2.75 at discharge  Renal ultrasound performed and showed multiple nephrolithiases   Physical Exam: Pertinent physical exam findings include - CVA, no reproducible   Lab Tests: I ordered, and personally interpreted labs.  The pertinent results include: Urinalysis leukocyte positive with greater than 50 WBCs. No bacteria or nitrites   Imaging Studies: I ordered and independently visualized and interpreted CT renal and I agree with the radiologist that there are no acute findings   Medications: Declined   MDM/Disposition: This is a 88 year old female who presented today with right flank pain.  Of note she was discharged from the hospital 2 weeks ago after being admitted for ATN.  She is following with her PCP biweekly for repeat kidney checks.  She is also off her antihypertensives due to the nephrotoxicity.  The plan is for her to follow-up with her PCP in 2 days to see how her blood pressure is doing and decide whether or not they want to start her on a calcium channel blocker.  Differential is broad today and inclusive of AKI/worsening kidney function, kidney stone, renal infarct, musculoskeletal pain and UTI.  CT scan negative for stone or other abnormalities.  Urinalysis with some WBCs and bacteria.  I engaged in shared decision-making and given patient's lack of UTI symptoms and stable vital signs she does not want to start a medication at this time.  She says she will ask her PCP to repeat her urinalysis in 2 days.  This is reasonable.  On further history taking patient does report that she has been struggling with constipation.  She has forgotten to take her Colace lately as well.  She says that when this discomfort comes on she feels as though she needs to have a bowel movement and that somewhat resolves afterwards.  Suspect she has some degree of constipation/gas causing the symptoms.  Additionally, she says it is worse sometimes when  she bends over or twists so there may be a musculoskeletal aspect after being hospitalized for 2 weeks.  Regardless, does not appear to have an emergent condition requiring any further interventions or admission today.  She will be discharged with her son with PCP follow-up in 2 days.   Final Clinical Impression(s) / ED Diagnoses Final diagnoses:  Flank pain    Rx / DC Orders ED Discharge Orders     None      Results and diagnoses were explained to the patient.  Return precautions discussed in full. Patient had no additional questions and expressed complete understanding.   This chart was dictated using voice recognition software.  Despite best efforts to proofread,  errors can occur which can change the documentation meaning.     Rhae Hammock, PA-C 05/09/22 [place], [place], DO 05/09/22 2237

## 2022-05-09 NOTE — Discharge Instructions (Signed)
You came to the emergency department today with right side pain.  As we discussed, you do not have any kidney stones on your scan.  This could be secondary to constipation so resume your Colace.  You may also do daily MiraLAX.  And also may be your muscles so as we discussed you may take Tylenol but avoid NSAIDs due to your kidney function.  You may also use your Voltaren.  Please do not hesitate to return to the emergency department with any worsening symptoms.  Please keep your appointment with your PCP in 2 days.  It was a pleasure to meet you and we hope you feel better!

## 2022-05-09 NOTE — ED Notes (Signed)
RN reviewed discharge instructions with pt. Pt verbalized understanding and had no further questions. VSS upon discharge.  

## 2022-05-09 NOTE — ED Triage Notes (Signed)
Patient here POV from Home.  Endorses Right Sided Flank Pain Intermittent for approximately 1 Week ago. Pain worsened last PM. No N/V/D. No Dysuria or Hematuria.   Was admitted recently a few weeks ago for Dehydration and AKI. Noted Pain in same Location then but was mild and attributed pain to arthritis.   NAD Noted during Triage. A&Ox4. GCS 15. Ambulatory.

## 2022-05-10 ENCOUNTER — Encounter: Payer: Self-pay | Admitting: Family Medicine

## 2022-05-12 ENCOUNTER — Encounter: Payer: Self-pay | Admitting: Family Medicine

## 2022-05-12 ENCOUNTER — Ambulatory Visit (INDEPENDENT_AMBULATORY_CARE_PROVIDER_SITE_OTHER): Payer: Medicare Other | Admitting: Family Medicine

## 2022-05-12 VITALS — BP 138/70 | HR 81 | Temp 97.9°F | Ht 61.0 in | Wt 143.6 lb

## 2022-05-12 DIAGNOSIS — R8271 Bacteriuria: Secondary | ICD-10-CM | POA: Diagnosis not present

## 2022-05-12 DIAGNOSIS — R109 Unspecified abdominal pain: Secondary | ICD-10-CM | POA: Diagnosis not present

## 2022-05-12 DIAGNOSIS — R8281 Pyuria: Secondary | ICD-10-CM | POA: Diagnosis not present

## 2022-05-12 DIAGNOSIS — H6121 Impacted cerumen, right ear: Secondary | ICD-10-CM | POA: Diagnosis not present

## 2022-05-12 DIAGNOSIS — I10 Essential (primary) hypertension: Secondary | ICD-10-CM

## 2022-05-12 DIAGNOSIS — N170 Acute kidney failure with tubular necrosis: Secondary | ICD-10-CM | POA: Diagnosis not present

## 2022-05-12 DIAGNOSIS — K5900 Constipation, unspecified: Secondary | ICD-10-CM

## 2022-05-12 NOTE — Progress Notes (Signed)
Subjective  CC:  Chief Complaint  Patient presents with   Hospitalization Follow-up    05/09/2022 (46 minutes) [facility]  04/12/2022 - 04/20/2022 (8 days) [facility]  04/12/2022 - 04/20/2022 (8 days) [facility]        HPI: Nicole Jimenez is a 88 y.o. female who presents to the office today to address the problems listed above in the chief complaint. 88 year old here for follow-up of ATN: Reviewed recent ER visit for right flank pain.  Unremarkable workup.  Lab values at the time show persistent improvement in kidney function.  No white blood cell count.  Abdominal imaging was unremarkable.  Lipase continues to come down.  Urinalysis did show pyuria and bacteriuria.  Patient denies obvious urinary symptoms outside of the right flank pain.  She admits to constipation with hard to pass small firm stools.  No blood in the stool.  Right flank pain seems to be worse with bending forward.  It is not constant.  She currently feels well. Hypertension: Currently only on calcium channel blocker.  Hydrochlorothiazide and ACE inhibitor's have been held due to ATN.  Blood pressures trending upward but remain in the normal range. Also complains of cannot hear out of the right ear, feels full.  No pain  Assessment  1. ATN (acute tubular necrosis)   2. Essential hypertension   3. Constipation, unspecified constipation type   4. Right flank pain   5. Bacteriuria with pyuria   6. Impacted cerumen of right ear      Plan  ATN: Continual improvement.  Continue to monitor.  Avoid nephrotoxins.  Holding ACE and diuretic. Hypertension: Continue to monitor.  Recheck again in 6 weeks. Constipation could be the cause of her right intermittent flank pain: Add MiraLAX and Colace and monitor.  Also could be musculoskeletal. Recheck urine culture given possible infection. Cerumen impaction resolved with  irrigation  Follow up: 6 weeks for recheck 06/11/2022  Orders Placed This Encounter  Procedures   Urine Culture   No orders of the defined types were placed in this encounter.     I reviewed the patients updated PMH, FH, and SocHx.    Patient Active Problem List   Diagnosis Date Noted   History of arterial embolism 08/21/2018    Priority: High   Anticoagulant long-term use 03/06/2018    Priority: High   Permanent atrial fibrillation 01/23/2018    Priority: High   Chronic diastolic CHF (congestive heart failure) 03/03/2017    Priority: High   Type 2 diabetes mellitus with peripheral neuropathy 01/24/2017    Priority: High   Subclinical hyperthyroidism 01/21/2014    Priority: High   Essential hypertension 02/23/2013    Priority: High   Combined hyperlipidemia associated with type 2 diabetes mellitus 02/23/2013    Priority: High   Major depression, recurrent, chronic 07/18/2012    Priority: High   DJD of right shoulder 01/24/2017    Priority: Medium    Bereavement due to life event 03/05/2016    Priority: Medium    Osteopenia 08/23/2015    Priority: Medium    Idiopathic scoliosis of thoracic spine 07/09/2014    Priority: Medium    Sjogren's syndrome 08/04/2012    Priority: Medium    DDD (degenerative disc disease), lumbosacral 07/15/2012    Priority: Medium    Lumbosacral spondylosis 07/15/2012    Priority: Medium    Osteoarthritis of knee 07/15/2012  Priority: Medium    Bilateral edema of lower extremity 01/26/2017    Priority: Low   Bilateral hip bursitis 07/17/2015    Priority: Low   Age-related nuclear cataract of both eyes 03/11/2015    Priority: Low   Insomnia 09/04/2013    Priority: Low   Allergic rhinitis 07/15/2012    Priority: Low   AKI (acute kidney injury) 04/12/2022   Current Meds  Medication Sig   Acetaminophen 500 MG coapsule Take 1,000 mg by mouth every 6 (six) hours as needed for pain.   apixaban (ELIQUIS) 2.5 MG TABS tablet Take 1  tablet (2.5 mg total) by mouth 2 (two) times daily.   Calcium Carb-Cholecalciferol 600-800 MG-UNIT CHEW Chew 1 each by mouth daily.    diclofenac sodium (VOLTAREN) 1 % GEL APPLY 2 G TOPICALLY 4 (FOUR) TIMES DAILY AS NEEDED. **PA DENIED** (Patient taking differently: Apply 2 g topically daily as needed (For pain).)   diltiazem (CARDIZEM CD) 180 MG 24 hr capsule TAKE 1 CAPSULE (180 MG TOTAL) BY MOUTH EVERY MORNING   famotidine (PEPCID) 20 MG tablet Take 1 tablet (20 mg total) by mouth daily.   Glucosamine-MSM-Hyaluronic Acd (JOINT HEALTH PO) Take 1 tablet by mouth daily.   glucose blood (ONETOUCH ULTRA) test strip USE TWICE DAILY TO CHECK BLOOD SUGAR - E11.40   Krill Oil 500 MG CAPS Take 500 mg by mouth daily.   loperamide (IMODIUM) 2 MG capsule Take 1 capsule (2 mg total) by mouth as needed for diarrhea or loose stools.   Multiple Vitamins-Minerals (MULTIVITAMIN ADULT PO) Take by mouth.   ONETOUCH ULTRA test strip USE AS INSTRUCTED   rosuvastatin (CRESTOR) 40 MG tablet Take 1 tablet (40 mg total) by mouth daily.   vortioxetine HBr (TRINTELLIX) 10 MG TABS tablet Take 1 tablet (10 mg total) by mouth daily.    Allergies: Patient is allergic to amoxicillin-pot clavulanate, amoxicillin, and clarithromycin. Family History: Patient family history includes Arthritis in her mother and sister; Diabetes in her father and sister; Healthy in her daughter, son, and son; Heart attack in her father and mother; Hyperlipidemia in her father; Other in her son. Social History:  Patient  reports that she has never smoked. She has never used smokeless tobacco. She reports that she does not drink alcohol and does not use drugs.  Review of Systems: Constitutional: Negative for fever malaise or anorexia Cardiovascular: negative for chest pain Respiratory: negative for SOB or persistent cough Gastrointestinal: negative for abdominal pain  Objective  Vitals: BP 138/70   Pulse 81   Temp 97.9 F (36.6 C)   Ht 5'  1" (1.549 m)   Wt 143 lb 9.6 oz (65.1 kg)   SpO2 98%   BMI 27.13 kg/m  General: no acute distress , A&Ox3, appears well HEENT: PEERL, conjunctiva normal, neck is supple Cardiovascular:  RRR without murmur or gallop.  Respiratory:  Good breath sounds bilaterally, CTAB with normal respiratory effort Abdomen is soft: Nontender, normal bowel sounds, no CVA tenderness bilaterally, no masses palpated Skin:  Warm, no rashes  No visits with results within 1 Day(s) from this visit.  Latest known visit with results is:  Admission on 05/09/2022, Discharged on 05/09/2022  Component Date Value Ref Range Status   Lipase 05/09/2022 55 (H)  11 - 51 U/L Final   Sodium 05/09/2022 139  135 - 145 mmol/L Final   Potassium 05/09/2022 3.6  3.5 - 5.1 mmol/L Final   Chloride 05/09/2022 103  98 - 111 mmol/L Final  CO2 05/09/2022 23  22 - 32 mmol/L Final   Glucose, Bld 05/09/2022 119 (H)  70 - 99 mg/dL Final   BUN 05/09/2022 26 (H)  8 - 23 mg/dL Final   Creatinine, Ser 05/09/2022 1.71 (H)  0.44 - 1.00 mg/dL Final   Calcium 05/09/2022 10.1  8.9 - 10.3 mg/dL Final   Total Protein 05/09/2022 8.3 (H)  6.5 - 8.1 g/dL Final   Albumin 05/09/2022 4.0  3.5 - 5.0 g/dL Final   AST 05/09/2022 33  15 - 41 U/L Final   ALT 05/09/2022 15  0 - 44 U/L Final   Alkaline Phosphatase 05/09/2022 78  38 - 126 U/L Final   Total Bilirubin 05/09/2022 0.7  0.3 - 1.2 mg/dL Final   GFR, Estimated 05/09/2022 29 (L)  >60 mL/min Final   Anion gap 05/09/2022 13  5 - 15 Final   WBC 05/09/2022 6.4  4.0 - 10.5 K/uL Final   RBC 05/09/2022 3.82 (L)  3.87 - 5.11 MIL/uL Final   Hemoglobin 05/09/2022 11.5 (L)  12.0 - 15.0 g/dL Final   HCT 05/09/2022 35.4 (L)  36.0 - 46.0 % Final   MCV 05/09/2022 92.7  80.0 - 100.0 fL Final   MCH 05/09/2022 30.1  26.0 - 34.0 pg Final   MCHC 05/09/2022 32.5  30.0 - 36.0 g/dL Final   RDW 05/09/2022 13.5  11.5 - 15.5 % Final   Platelets 05/09/2022 182  150 - 400 K/uL Final   nRBC 05/09/2022 0.0  0.0 - 0.2 %  Final   Color, Urine 05/09/2022 COLORLESS (A)  YELLOW Final   APPearance 05/09/2022 CLEAR  CLEAR Final   Specific Gravity, Urine 05/09/2022 1.008  1.005 - 1.030 Final   pH 05/09/2022 7.0  5.0 - 8.0 Final   Glucose, UA 05/09/2022 NEGATIVE  NEGATIVE mg/dL Final   Hgb urine dipstick 05/09/2022 SMALL (A)  NEGATIVE Final   Bilirubin Urine 05/09/2022 NEGATIVE  NEGATIVE Final   Ketones, ur 05/09/2022 NEGATIVE  NEGATIVE mg/dL Final   Protein, ur 05/09/2022 30 (A)  NEGATIVE mg/dL Final   Nitrite 05/09/2022 NEGATIVE  NEGATIVE Final   Leukocytes,Ua 05/09/2022 MODERATE (A)  NEGATIVE Final   RBC / HPF 05/09/2022 0-5  0 - 5 RBC/hpf Final   WBC, UA 05/09/2022 >50  0 - 5 WBC/hpf Final   Bacteria, UA 05/09/2022 NONE SEEN  NONE SEEN Final   Squamous Epithelial / HPF 05/09/2022 0-5  0 - 5 /HPF Final   Mucus 05/09/2022 PRESENT   Final   Non Squamous Epithelial 05/09/2022 0-5 (A)  NONE SEEN Final    Commons side effects, risks, benefits, and alternatives for medications and treatment plan prescribed today were discussed, and the patient expressed understanding of the given instructions. Patient is instructed to call or message via MyChart if he/she has any questions or concerns regarding our treatment plan. No barriers to understanding were identified. We discussed Red Flag symptoms and signs in detail. Patient expressed understanding regarding what to do in case of urgent or emergency type symptoms.  Medication list was reconciled, printed and provided to the patient in AVS. Patient instructions and summary information was reviewed with the patient as documented in the AVS. This note was prepared with assistance of Dragon voice recognition software. Occasional wrong-word or sound-a-like substitutions may have occurred due to the inherent limitations of voice recognition software

## 2022-05-12 NOTE — Patient Instructions (Signed)
Please return in 6 weeks for recheck  Use colace and miralax daily to help with your constipation.  If you have any questions or concerns, please don't hesitate to send me a message via MyChart or call the office at [phone]. Thank you for visiting with [place] today! It's our pleasure caring for you.

## 2022-05-13 ENCOUNTER — Telehealth: Payer: Self-pay

## 2022-05-13 ENCOUNTER — Other Ambulatory Visit: Payer: Self-pay

## 2022-05-13 LAB — URINE CULTURE
MICRO NUMBER:: 14777605
SPECIMEN QUALITY:: ADEQUATE

## 2022-05-13 MED ORDER — APIXABAN 2.5 MG PO TABS: 2.5000 mg | ORAL_TABLET | Freq: Two times a day (BID) | ORAL | 0 refills | Status: DC

## 2022-05-13 NOTE — Telephone Encounter (Signed)
        Patient  visited [facility] on 3/31    Telephone encounter attempt :  1st  A HIPAA compliant voice message was left requesting a return call.  Instructed patient to call back .    [facility] [id number], [place], [place] [address] Phone: [phone] Email: [email or web address]

## 2022-05-14 ENCOUNTER — Telehealth: Payer: Self-pay

## 2022-05-14 NOTE — Telephone Encounter (Signed)
        Patient  visited [facility] on 3/31     Telephone encounter attempt :    A HIPAA compliant voice message was left requesting a return call.  Instructed patient to call back.

## 2022-05-26 ENCOUNTER — Telehealth: Payer: Self-pay | Admitting: Family Medicine

## 2022-05-26 NOTE — Telephone Encounter (Signed)
Copied from CRM [id number]. Topic: Medicare AWV >> May 26, 2022 12:19 PM Gwenith Spitz wrote: Reason for CRM: Called patient to reschedule Medicare Annual Wellness Visit (AWV). Left message for patient to call back and reschedule Medicare Annual Wellness Visit (AWV).  Last date of AWV: 06/05/2021  Please schedule an appointment at any time with Inetta Fermo, [facility]. Please schedule AWVS with Inetta Fermo, [facility].  If any questions, please contact me at [phone].  Thank you ,  Gabriel Cirri [facility] AWV TEAM Direct Dial [phone]

## 2022-06-01 ENCOUNTER — Telehealth: Payer: Self-pay | Admitting: Family Medicine

## 2022-06-01 NOTE — Telephone Encounter (Signed)
Copied from CRM [id number]. Topic: Medicare AWV >> Jun 01, 2022 10:09 AM Gwenith Spitz wrote: Reason for CRM: Called patient to schedule Medicare Annual Wellness Visit (AWV). Left message for patient to call back and schedule Medicare Annual Wellness Visit (AWV).  Last date of AWV: 06/05/2021  Please reschedule an appointment at any time with Inetta Fermo, [facility]. Please reschedule AWVS with Inetta Fermo, [facility].  If any questions, please contact me at [phone].  Thank you ,  Gabriel Cirri [facility] AWV TEAM Direct Dial [phone]

## 2022-06-02 ENCOUNTER — Telehealth: Payer: Self-pay | Admitting: Family Medicine

## 2022-06-02 NOTE — Telephone Encounter (Signed)
Copied from CRM [id number]. Topic: Medicare AWV >> Jun 02, 2022  8:48 AM Gwenith Spitz wrote: Reason for CRM: Called patient to reschedule Medicare Annual Wellness Visit (AWV). Left message for patient to call back and reschedule Medicare Annual Wellness Visit (AWV).  Last date of AWV: 06/05/2021  Please schedule an appointment at any time with Inetta Fermo, [facility]. Please schedule AWVS with Inetta Fermo, [facility].  If any questions, please contact me at [phone].  Thank you ,  Gabriel Cirri [facility] AWV TEAM Direct Dial [phone]

## 2022-06-06 ENCOUNTER — Encounter: Payer: Self-pay | Admitting: Family Medicine

## 2022-06-07 ENCOUNTER — Encounter: Payer: Self-pay | Admitting: Family Medicine

## 2022-06-07 ENCOUNTER — Ambulatory Visit (INDEPENDENT_AMBULATORY_CARE_PROVIDER_SITE_OTHER): Payer: Medicare Other | Admitting: Family Medicine

## 2022-06-07 VITALS — BP 138/64 | HR 80 | Temp 97.7°F | Ht 61.0 in | Wt 146.6 lb

## 2022-06-07 DIAGNOSIS — I5032 Chronic diastolic (congestive) heart failure: Secondary | ICD-10-CM | POA: Diagnosis not present

## 2022-06-07 DIAGNOSIS — M109 Gout, unspecified: Secondary | ICD-10-CM | POA: Diagnosis not present

## 2022-06-07 DIAGNOSIS — R600 Localized edema: Secondary | ICD-10-CM | POA: Diagnosis not present

## 2022-06-07 MED ORDER — POTASSIUM CHLORIDE CRYS ER 20 MEQ PO TBCR: 20.0000 meq | EXTENDED_RELEASE_TABLET | Freq: Every day | ORAL | 3 refills | Status: DC | PRN

## 2022-06-07 MED ORDER — PREDNISONE 20 MG PO TABS: ORAL_TABLET | ORAL | 0 refills | Status: DC

## 2022-06-07 MED ORDER — FUROSEMIDE 20 MG PO TABS: 20.0000 mg | ORAL_TABLET | Freq: Every day | ORAL | 3 refills | Status: DC | PRN

## 2022-06-07 NOTE — Progress Notes (Signed)
Subjective  CC:  Chief Complaint  Patient presents with   Foot Swelling    Pt stated that her rt foot swelled up last weekend     HPI: Nicole Jimenez is a 88 y.o. female who presents to the office today to address the problems listed above in the chief complaint. Patient reports swollen right first toe joint that started about a week ago.  Was exquisitely painful at first.  Now pain has improved significantly.  Also with bilateral lower extremity swelling.  Reviewed notes from last month.  She was treated for ATN due to hypotension, and diuretics were stopped.  She does have chronic lower extremity edema and chronic diastolic heart failure.  She denies shortness of breath chest pain or palpitations.  She has no redness or weeping.  No calf pain. Hypertension: Lisinopril and HCTZ were stopped last month.  Blood pressure remains normal.  Assessment  1. Acute gout involving toe of right foot, unspecified cause   2. Bilateral edema of lower extremity   3. Chronic diastolic CHF (congestive heart failure) (HCC)      Plan  Gout, presumed: Most consistent with first gout flare.  Could be related to medication change and dehydration although she says she is drinking plenty of fluids.  Will treat with prednisone 60 mg daily x 3 days.  Then monitor for recurrence.  No lab work at this time. Edema due to chronic diastolic heart failure: Will restart diuretic: Lasix 20 mg daily with potassium 20 mill equivalents daily.  Monitor weights.  She has a appointment next week for follow-up and we can recheck her lab work at that time.  Follow up: As schedule 06/11/2022  No orders of the defined types were placed in this encounter.  Meds ordered this encounter  Medications   predniSONE (DELTASONE) 20 MG tablet    Sig: Take 3 tabs daily for 3 days    Dispense:  9 tablet    Refill:  0   furosemide (LASIX) 20 MG tablet    Sig: Take 1 tablet (20 mg total) by mouth daily as needed for edema. Take with  potassium    Dispense:  30 tablet    Refill:  3   potassium chloride SA (KLOR-CON M) 20 MEQ tablet    Sig: Take 1 tablet (20 mEq total) by mouth daily as needed (leg swelling). Take with furosemide    Dispense:  30 tablet    Refill:  3      I reviewed the patients updated PMH, FH, and SocHx.    Patient Active Problem List   Diagnosis Date Noted   History of arterial embolism 08/21/2018    Priority: High   Anticoagulant long-term use 03/06/2018    Priority: High   Permanent atrial fibrillation (HCC) 01/23/2018    Priority: High   Chronic diastolic CHF (congestive heart failure) (HCC) 03/03/2017    Priority: High   Type 2 diabetes mellitus with peripheral neuropathy (HCC) 01/24/2017    Priority: High   Subclinical hyperthyroidism 01/21/2014    Priority: High   Essential hypertension 02/23/2013    Priority: High   Combined hyperlipidemia associated with type 2 diabetes mellitus (HCC) 02/23/2013    Priority: High   Major depression, recurrent, chronic (HCC) 07/18/2012    Priority: High   DJD of right shoulder 01/24/2017    Priority: Medium    Bereavement due to life event 03/05/2016    Priority: Medium    Osteopenia 08/23/2015  Priority: Medium    Idiopathic scoliosis of thoracic spine 07/09/2014    Priority: Medium    Sjogren's syndrome (HCC) 08/04/2012    Priority: Medium    DDD (degenerative disc disease), lumbosacral 07/15/2012    Priority: Medium    Lumbosacral spondylosis 07/15/2012    Priority: Medium    Osteoarthritis of knee 07/15/2012    Priority: Medium    Bilateral edema of lower extremity 01/26/2017    Priority: Low   Bilateral hip bursitis 07/17/2015    Priority: Low   Age-related nuclear cataract of both eyes 03/11/2015    Priority: Low   Insomnia 09/04/2013    Priority: Low   Allergic rhinitis 07/15/2012    Priority: Low   AKI (acute kidney injury) (HCC) 04/12/2022   Current Meds  Medication Sig   Acetaminophen 500 MG coapsule Take 1,000 mg  by mouth every 6 (six) hours as needed for pain.   apixaban (ELIQUIS) 2.5 MG TABS tablet Take 1 tablet (2.5 mg total) by mouth 2 (two) times daily.   Calcium Carb-Cholecalciferol 600-800 MG-UNIT CHEW Chew 1 each by mouth daily.    cycloSPORINE (RESTASIS) 0.05 % ophthalmic emulsion 1 drop 2 (two) times daily.   diclofenac sodium (VOLTAREN) 1 % GEL APPLY 2 G TOPICALLY 4 (FOUR) TIMES DAILY AS NEEDED. **PA DENIED** (Patient taking differently: Apply 2 g topically daily as needed (For pain).)   diltiazem (CARDIZEM CD) 180 MG 24 hr capsule TAKE 1 CAPSULE (180 MG TOTAL) BY MOUTH EVERY MORNING   furosemide (LASIX) 20 MG tablet Take 1 tablet (20 mg total) by mouth daily as needed for edema. Take with potassium   Glucosamine-MSM-Hyaluronic Acd (JOINT HEALTH PO) Take 1 tablet by mouth daily.   glucose blood (ONETOUCH ULTRA) test strip USE TWICE DAILY TO CHECK BLOOD SUGAR - E11.40   Krill Oil 500 MG CAPS Take 500 mg by mouth daily.   loperamide (IMODIUM) 2 MG capsule Take 1 capsule (2 mg total) by mouth as needed for diarrhea or loose stools.   Multiple Vitamins-Minerals (MULTIVITAMIN ADULT PO) Take by mouth.   ONETOUCH ULTRA test strip USE AS INSTRUCTED   potassium chloride SA (KLOR-CON M) 20 MEQ tablet Take 1 tablet (20 mEq total) by mouth daily as needed (leg swelling). Take with furosemide   predniSONE (DELTASONE) 20 MG tablet Take 3 tabs daily for 3 days   rosuvastatin (CRESTOR) 40 MG tablet Take 1 tablet (40 mg total) by mouth daily.   vortioxetine HBr (TRINTELLIX) 10 MG TABS tablet Take 1 tablet (10 mg total) by mouth daily.    Allergies: Patient is allergic to amoxicillin-pot clavulanate, amoxicillin, and clarithromycin. Family History: Patient family history includes Arthritis in her mother and sister; Diabetes in her father and sister; Healthy in her daughter, son, and son; Heart attack in her father and mother; Hyperlipidemia in her father; Other in her son. Social History:  Patient  reports  that she has never smoked. She has never used smokeless tobacco. She reports that she does not drink alcohol and does not use drugs.  Review of Systems: Constitutional: Negative for fever malaise or anorexia Cardiovascular: negative for chest pain Respiratory: negative for SOB or persistent cough Gastrointestinal: negative for abdominal pain  Objective  Vitals: BP 138/64   Pulse 80   Temp 97.7 F (36.5 C)   Ht 5\' 1"  (1.549 m)   Wt 146 lb 9.6 oz (66.5 kg)   SpO2 98%   BMI 27.70 kg/m  General: no acute distress , A&Ox3  HEENT: PEERL, conjunctiva normal, neck is supple Cardiovascular:  RRR without murmur or gallop.  Respiratory:  Good breath sounds bilaterally, CTAB with normal respiratory effort Skin:  Warm, no rashes Right foot with tender first MTP with redness. Bilateral edema to mid calves, Nontender, no cords, no redness or weeping  Commons side effects, risks, benefits, and alternatives for medications and treatment plan prescribed today were discussed, and the patient expressed understanding of the given instructions. Patient is instructed to call or message via MyChart if he/she has any questions or concerns regarding our treatment plan. No barriers to understanding were identified. We discussed Red Flag symptoms and signs in detail. Patient expressed understanding regarding what to do in case of urgent or emergency type symptoms.  Medication list was reconciled, printed and provided to the patient in AVS. Patient instructions and summary information was reviewed with the patient as documented in the AVS. This note was prepared with assistance of Dragon voice recognition software. Occasional wrong-word or sound-a-like substitutions may have occurred due to the inherent limitations of voice recognition software

## 2022-06-07 NOTE — Patient Instructions (Signed)
Please follow up as scheduled for your next visit with me: 06/28/2022   If you have any questions or concerns, please don't hesitate to send me a message via MyChart or call the office at [phone]. Thank you for visiting with [place] today! It's our pleasure caring for you.   Please take the Lasix or furosemide daily with potassium until the swelling in your legs has improved.  You can then use as needed.

## 2022-06-08 ENCOUNTER — Telehealth: Payer: Self-pay | Admitting: Family Medicine

## 2022-06-08 NOTE — Telephone Encounter (Signed)
Contacted Nicole Jimenez to schedule their annual wellness visit. Appointment made for 06/22/2022.  Gabriel Cirri [facility] AWV TEAM Direct Dial [phone]

## 2022-06-22 ENCOUNTER — Ambulatory Visit (INDEPENDENT_AMBULATORY_CARE_PROVIDER_SITE_OTHER): Payer: Medicare Other

## 2022-06-22 VITALS — Wt 140.0 lb

## 2022-06-22 DIAGNOSIS — Z0000 Encounter for general adult medical examination without abnormal findings: Secondary | ICD-10-CM

## 2022-06-22 NOTE — Patient Instructions (Signed)
Ms. Tiberi , Thank you for taking time to come for your Medicare Wellness Visit. I appreciate your ongoing commitment to your health goals. Please review the following plan we discussed and let me know if I can assist you in the future.   These are the goals we discussed:  Goals      Increase physical activity     Increase activity.      Patient Stated     Start exercising      Patient Stated     Lose weight      Patient Stated     Maintain weight         This is a list of the screening recommended for you and due dates:  Health Maintenance  Topic Date Due   Eye exam for diabetics  02/26/2022   COVID-19 Vaccine (7 - 2023-24 season) 06/23/2022*   Flu Shot  09/09/2022   Hemoglobin A1C  10/13/2022   Yearly kidney health urinalysis for diabetes  10/24/2022   Complete foot exam   10/24/2022   DEXA scan (bone density measurement)  11/14/2022   Yearly kidney function blood test for diabetes  05/09/2023   Medicare Annual Wellness Visit  06/22/2023   Pneumonia Vaccine  Completed   Zoster (Shingles) Vaccine  Completed   HPV Vaccine  Aged Out   DTaP/Tdap/Td vaccine  Discontinued  *Topic was postponed. The date shown is not the original due date.    Advanced directives: Please bring a copy of your health care power of attorney and living will to the office at your convenience.  Conditions/risks identified: maintain weight   Next appointment: Follow up in one year for your annual wellness visit    Preventive Care 65 Years and Older, Female Preventive care refers to lifestyle choices and visits with your health care provider that can promote health and wellness. What does preventive care include? A yearly physical exam. This is also called an annual well check. Dental exams once or twice a year. Routine eye exams. Ask your health care provider how often you should have your eyes checked. Personal lifestyle choices, including: Daily care of your teeth and gums. Regular  physical activity. Eating a healthy diet. Avoiding tobacco and drug use. Limiting alcohol use. Practicing safe sex. Taking low-dose aspirin every day. Taking vitamin and mineral supplements as recommended by your health care provider. What happens during an annual well check? The services and screenings done by your health care provider during your annual well check will depend on your age, overall health, lifestyle risk factors, and family history of disease. Counseling  Your health care provider may ask you questions about your: Alcohol use. Tobacco use. Drug use. Emotional well-being. Home and relationship well-being. Sexual activity. Eating habits. History of falls. Memory and ability to understand (cognition). Work and work Astronomer. Reproductive health. Screening  You may have the following tests or measurements: Height, weight, and BMI. Blood pressure. Lipid and cholesterol levels. These may be checked every 5 years, or more frequently if you are over 75 years old. Skin check. Lung cancer screening. You may have this screening every year starting at age 58 if you have a 30-pack-year history of smoking and currently smoke or have quit within the past 15 years. Fecal occult blood test (FOBT) of the stool. You may have this test every year starting at age 21. Flexible sigmoidoscopy or colonoscopy. You may have a sigmoidoscopy every 5 years or a colonoscopy every 10 years starting at age  50. Hepatitis C blood test. Hepatitis B blood test. Sexually transmitted disease (STD) testing. Diabetes screening. This is done by checking your blood sugar (glucose) after you have not eaten for a while (fasting). You may have this done every 1-3 years. Bone density scan. This is done to screen for osteoporosis. You may have this done starting at age 67. Mammogram. This may be done every 1-2 years. Talk to your health care provider about how often you should have regular mammograms. Talk  with your health care provider about your test results, treatment options, and if necessary, the need for more tests. Vaccines  Your health care provider may recommend certain vaccines, such as: Influenza vaccine. This is recommended every year. Tetanus, diphtheria, and acellular pertussis (Tdap, Td) vaccine. You may need a Td booster every 10 years. Zoster vaccine. You may need this after age 23. Pneumococcal 13-valent conjugate (PCV13) vaccine. One dose is recommended after age 58. Pneumococcal polysaccharide (PPSV23) vaccine. One dose is recommended after age 82. Talk to your health care provider about which screenings and vaccines you need and how often you need them. This information is not intended to replace advice given to you by your health care provider. Make sure you discuss any questions you have with your health care provider. Document Released: 02/21/2015 Document Revised: 10/15/2015 Document Reviewed: 11/26/2014 Elsevier Interactive Patient Education  2017 [place] Prevention in the Home Falls can cause injuries. They can happen to people of all ages. There are many things you can do to make your home safe and to help prevent falls. What can I do on the outside of my home? Regularly fix the edges of walkways and driveways and fix any cracks. Remove anything that might make you trip as you walk through a door, such as a raised step or threshold. Trim any bushes or trees on the path to your home. Use bright outdoor lighting. Clear any walking paths of anything that might make someone trip, such as rocks or tools. Regularly check to see if handrails are loose or broken. Make sure that both sides of any steps have handrails. Any raised decks and porches should have guardrails on the edges. Have any leaves, snow, or ice cleared regularly. Use sand or salt on walking paths during winter. Clean up any spills in your garage right away. This includes oil or grease  spills. What can I do in the bathroom? Use night lights. Install grab bars by the toilet and in the tub and shower. Do not use towel bars as grab bars. Use non-skid mats or decals in the tub or shower. If you need to sit down in the shower, use a plastic, non-slip stool. Keep the floor dry. Clean up any water that spills on the floor as soon as it happens. Remove soap buildup in the tub or shower regularly. Attach bath mats securely with double-sided non-slip rug tape. Do not have throw rugs and other things on the floor that can make you trip. What can I do in the bedroom? Use night lights. Make sure that you have a light by your bed that is easy to reach. Do not use any sheets or blankets that are too big for your bed. They should not hang down onto the floor. Have a firm chair that has side arms. You can use this for support while you get dressed. Do not have throw rugs and other things on the floor that can make you trip. What can I do  in the kitchen? Clean up any spills right away. Avoid walking on wet floors. Keep items that you use a lot in easy-to-reach places. If you need to reach something above you, use a strong step stool that has a grab bar. Keep electrical cords out of the way. Do not use floor polish or wax that makes floors slippery. If you must use wax, use non-skid floor wax. Do not have throw rugs and other things on the floor that can make you trip. What can I do with my stairs? Do not leave any items on the stairs. Make sure that there are handrails on both sides of the stairs and use them. Fix handrails that are broken or loose. Make sure that handrails are as long as the stairways. Check any carpeting to make sure that it is firmly attached to the stairs. Fix any carpet that is loose or worn. Avoid having throw rugs at the top or bottom of the stairs. If you do have throw rugs, attach them to the floor with carpet tape. Make sure that you have a light switch at the  top of the stairs and the bottom of the stairs. If you do not have them, ask someone to add them for you. What else can I do to help prevent falls? Wear shoes that: Do not have high heels. Have rubber bottoms. Are comfortable and fit you well. Are closed at the toe. Do not wear sandals. If you use a stepladder: Make sure that it is fully opened. Do not climb a closed stepladder. Make sure that both sides of the stepladder are locked into place. Ask someone to hold it for you, if possible. Clearly mark and make sure that you can see: Any grab bars or handrails. First and last steps. Where the edge of each step is. Use tools that help you move around (mobility aids) if they are needed. These include: Canes. Walkers. Scooters. Crutches. Turn on the lights when you go into a dark area. Replace any light bulbs as soon as they burn out. Set up your furniture so you have a clear path. Avoid moving your furniture around. If any of your floors are uneven, fix them. If there are any pets around you, be aware of where they are. Review your medicines with your doctor. Some medicines can make you feel dizzy. This can increase your chance of falling. Ask your doctor what other things that you can do to help prevent falls. This information is not intended to replace advice given to you by your health care provider. Make sure you discuss any questions you have with your health care provider. Document Released: 11/21/2008 Document Revised: 07/03/2015 Document Reviewed: 03/01/2014 Elsevier Interactive Patient Education  2017 [employer].

## 2022-06-22 NOTE — Progress Notes (Signed)
I connected with  Nicole Jimenez on 06/22/22 by a audio enabled telemedicine application and verified that I am speaking with the correct person using two identifiers.  Patient Location: Home  Provider Location: Office/Clinic  I discussed the limitations of evaluation and management by telemedicine. The patient expressed understanding and agreed to proceed.   Subjective:   Nicole Jimenez is a 88 y.o. female who presents for Medicare Annual (Subsequent) preventive examination.  Review of Systems     Cardiac Risk Factors include: advanced age (>20men, >38 women);diabetes mellitus;hypertension;dyslipidemia     Objective:    Today's Vitals   06/22/22 1141  Weight: 140 lb (63.5 kg)   Body mass index is 26.45 kg/m.     06/22/2022   11:58 AM 05/09/2022    3:53 PM 04/13/2022    4:00 PM 06/05/2021   11:21 AM 06/02/2020   11:17 AM 11/20/2018   11:55 AM 07/08/2017    2:33 PM  Advanced Directives  Does Patient Have a Medical Advance Directive? Yes No Yes Yes Yes Yes Yes  Type of Estate agent of [place];Living will  Healthcare Power of [place] Living will Healthcare Power of [place];Living will Living will;Healthcare Power of [employer] Power of [place];Living will  Does patient want to make changes to medical advance directive?  No - Patient declined No - Patient declined   No - Patient declined   Copy of Healthcare Power of Attorney in Chart? No - copy requested  No - copy requested  No - copy requested No - copy requested No - copy requested  Would patient like information on creating a medical advance directive?  No - Patient declined No - Patient declined        Current Medications (verified) Outpatient Encounter Medications as of 06/22/2022  Medication Sig   Acetaminophen 500 MG coapsule Take 1,000 mg by mouth every 6 (six) hours as needed for pain.   apixaban (ELIQUIS) 2.5 MG TABS tablet Take 1 tablet (2.5 mg total) by mouth 2 (two) times  daily.   Calcium Carb-Cholecalciferol 600-800 MG-UNIT CHEW Chew 1 each by mouth daily.    cycloSPORINE (RESTASIS) 0.05 % ophthalmic emulsion 1 drop 2 (two) times daily.   diclofenac sodium (VOLTAREN) 1 % GEL APPLY 2 G TOPICALLY 4 (FOUR) TIMES DAILY AS NEEDED. **PA DENIED** (Patient taking differently: Apply 2 g topically daily as needed (For pain).)   diltiazem (CARDIZEM CD) 180 MG 24 hr capsule TAKE 1 CAPSULE (180 MG TOTAL) BY MOUTH EVERY MORNING   Glucosamine-MSM-Hyaluronic Acd (JOINT HEALTH PO) Take 1 tablet by mouth daily.   glucose blood (ONETOUCH ULTRA) test strip USE TWICE DAILY TO CHECK BLOOD SUGAR - E11.40   Krill Oil 500 MG CAPS Take 500 mg by mouth daily.   loperamide (IMODIUM) 2 MG capsule Take 1 capsule (2 mg total) by mouth as needed for diarrhea or loose stools.   Multiple Vitamins-Minerals (MULTIVITAMIN ADULT PO) Take by mouth.   ONETOUCH ULTRA test strip USE AS INSTRUCTED   rosuvastatin (CRESTOR) 40 MG tablet Take 1 tablet (40 mg total) by mouth daily.   vortioxetine HBr (TRINTELLIX) 10 MG TABS tablet Take 1 tablet (10 mg total) by mouth daily.   furosemide (LASIX) 20 MG tablet Take 1 tablet (20 mg total) by mouth daily as needed for edema. Take with potassium (Patient not taking: Reported on 06/22/2022)   potassium chloride SA (KLOR-CON M) 20 MEQ tablet Take 1 tablet (20 mEq total) by mouth daily as needed (leg  swelling). Take with furosemide (Patient not taking: Reported on 06/22/2022)   [DISCONTINUED] famotidine (PEPCID) 20 MG tablet Take 1 tablet (20 mg total) by mouth daily. (Patient not taking: Reported on 06/07/2022)   [DISCONTINUED] predniSONE (DELTASONE) 20 MG tablet Take 3 tabs daily for 3 days   No facility-administered encounter medications on file as of 06/22/2022.    Allergies (verified) Amoxicillin-pot clavulanate, Amoxicillin, and Clarithromycin   History: Past Medical History:  Diagnosis Date   Arthritis    Depression    Diabetes mellitus without  complication (HCC)    Borderline   Diastolic CHF (HCC) 03/03/2017   Echo 11/2016, nl EF but LVH and diastolic dysfunction   GERD (gastroesophageal reflux disease)    Hyperlipidemia    Hypertension    Peripheral arterial disease (HCC) 2018   thromboembolism of femoral artery   Thyroid disease    subclinical hyperthyroidism managed by endocrinology   Past Surgical History:  Procedure Laterality Date   FEMORAL ENDARTERECTOMY Right 11/29/2016   Family History  Problem Relation Age of Onset   Heart attack Mother    Arthritis Mother    Diabetes Father    Heart attack Father    Hyperlipidemia Father    Arthritis Sister    Diabetes Sister        adopted   Healthy Daughter    Healthy Son    Healthy Son    Other Son        cyst on thyroid   Social History   Socioeconomic History   Marital status: Widowed    Spouse name: Not on file   Number of children: Not on file   Years of education: Not on file   Highest education level: Not on file  Occupational History   Occupation: retired aesthician  Tobacco Use   Smoking status: Never   Smokeless tobacco: Never  Vaping Use   Vaping Use: Never used  Substance and Sexual Activity   Alcohol use: No   Drug use: No   Sexual activity: Never  Other Topics Concern   Not on file  Social History Narrative   Not on file   Social Determinants of Health   Financial Resource Strain: Low Risk  (06/22/2022)   Overall Financial Resource Strain (CARDIA)    Difficulty of Paying Living Expenses: Not hard at all  Food Insecurity: No Food Insecurity (06/22/2022)   Hunger Vital Sign    Worried About Running Out of Food in the Last Year: Never true    Ran Out of Food in the Last Year: Never true  Transportation Needs: No Transportation Needs (06/22/2022)   PRAPARE - Administrator, Civil Service (Medical): No    Lack of Transportation (Non-Medical): No  Physical Activity: Inactive (06/22/2022)   Exercise Vital Sign    Days of  Exercise per Week: 0 days    Minutes of Exercise per Session: 0 min  Stress: No Stress Concern Present (06/22/2022)   [employer] of Occupational Health - Occupational Stress Questionnaire    Feeling of Stress : Not at all  Social Connections: Moderately Integrated (06/22/2022)   Social Connection and Isolation Panel [NHANES]    Frequency of Communication with Friends and Family: Three times a week    Frequency of Social Gatherings with Friends and Family: Twice a week    Attends Religious Services: More than 4 times per year    Active Member of [employer] or Organizations: Yes    Attends Banker Meetings: 1  to 4 times per year    Marital Status: Widowed    Tobacco Counseling Counseling given: Not Answered   Clinical Intake:  Pre-visit preparation completed: Yes  Pain : No/denies pain     BMI - recorded: 26.45 Nutritional Status: BMI 25 -29 Overweight Nutritional Risks: None Diabetes: Yes CBG done?: No Did pt. bring in CBG monitor from home?: No  How often do you need to have someone help you when you read instructions, pamphlets, or other written materials from your doctor or pharmacy?: 1 - Never  Diabetic?Nutrition Risk Assessment:  Has the patient had any N/V/D within the last 2 months?  No  Does the patient have any non-healing wounds?  No  Has the patient had any unintentional weight loss or weight gain?  No   Diabetes:  Is the patient diabetic?  Yes  If diabetic, was a CBG obtained today?  No  Did the patient bring in their glucometer from home?  No  How often do you monitor your CBG's? As needed .   Financial Strains and Diabetes Management:  Are you having any financial strains with the device, your supplies or your medication? No .  Does the patient want to be seen by Chronic Care Management for management of their diabetes?  Yes Would the patient like to be referred to a Nutritionist or for Diabetic Management?  Yes   Diabetic  Exams:  Diabetic Eye Exam: Overdue for diabetic eye exam. Pt has been advised about the importance in completing this exam. Patient advised to call and schedule an eye exam. Diabetic Foot Exam: Completed 10/23/21   Interpreter Needed?: No  Information entered by :: Lanier Ensign, LPN   Activities of Daily Living    06/22/2022   11:50 AM 04/12/2022    8:00 PM  In your present state of health, do you have any difficulty performing the following activities:  Hearing? 1 0  Comment HOH in  groups at times   Vision? 0 0  Difficulty concentrating or making decisions? 0 0  Walking or climbing stairs? 0 0  Dressing or bathing? 0 0  Doing errands, shopping? 0 0  Preparing Food and eating ? N   Using the Toilet? N   In the past six months, have you accidently leaked urine? N   Do you have problems with loss of bowel control? N   Managing your Medications? N   Managing your Finances? N   Housekeeping or managing your Housekeeping? N     Patient Care Team: Willow Ora, MD as PCP - General (Family Medicine) Jodelle Red, MD as PCP - Cardiology (Cardiology) Lovenia Shuck, MD as Referring Physician (Ophthalmology) Ocie Cornfield, MD as Consulting Physician (Endocrinology) Janene Madeira, MD as Referring Physician (Vascular Surgery) Ollen Gross, MD as Consulting Physician (Orthopedic Surgery) Dermatology, [facility] as Consulting Physician  Indicate any recent Medical Services you may have received from other than [facility] providers in the past year (date may be approximate).     Assessment:   This is a routine wellness examination for Epps.  Hearing/Vision screen Hearing Screening - Comments:: Pt stated HOH at times  Vision Screening - Comments:: Pt follows up with Dr Jimmy Footman for annual eye exams   Dietary issues and exercise activities discussed: Current Exercise Habits: The patient does not participate in regular exercise at present   Goals Addressed              This Visit's Progress    Patient Stated  Maintain weight        Depression Screen    06/22/2022   11:48 AM 06/07/2022    3:21 PM 05/12/2022    2:26 PM 04/12/2022   11:35 AM 04/05/2022    4:26 PM 01/26/2022    1:02 PM 01/20/2022    3:24 PM  PHQ 2/9 Scores  PHQ - 2 Score 0 0 0 0 0 0 0  PHQ- 9 Score      0 0    Fall Risk    06/22/2022   11:50 AM 06/07/2022    3:20 PM 05/12/2022    2:26 PM 04/12/2022   11:35 AM 04/05/2022    4:26 PM  Fall Risk   Falls in the past year? 0 0 0 0 0  Number falls in past yr: 0 0 0 0 0  Injury with Fall? 0 0 0 0 0  Risk for fall due to : Impaired vision No Fall Risks;Other (Comment) No Fall Risks No Fall Risks No Fall Risks  Follow up Falls prevention discussed Falls evaluation completed Falls evaluation completed Falls evaluation completed Falls evaluation completed    FALL RISK PREVENTION PERTAINING TO THE HOME:  Any stairs in or around the home? Yes  If so, are there any without handrails? No  Home free of loose throw rugs in walkways, pet beds, electrical cords, etc? Yes  Adequate lighting in your home to reduce risk of falls? Yes   ASSISTIVE DEVICES UTILIZED TO PREVENT FALLS:  Life alert? No  Use of a cane, walker or w/c? No  Grab bars in the bathroom? Yes  Shower chair or bench in shower? No  Elevated toilet seat or a handicapped toilet? Yes   TIMED UP AND GO:  Was the test performed? No .   Cognitive Function:    11/20/2018   11:54 AM 07/08/2017    2:40 PM  MMSE - Mini Mental State Exam  Orientation to time 5 5  Orientation to Place 5 5  Registration 3 3  Attention/ Calculation 5 5  Recall 3 1  Language- name 2 objects 2 2  Language- repeat 1 1  Language- follow 3 step command 3 3  Language- read & follow direction 1 1  Write a sentence 1 1  Copy design 1 1  Total score 30 28        06/22/2022   11:52 AM 06/05/2021   11:28 AM 06/02/2020   11:22 AM  6CIT Screen  What Year? 0 points 0 points 0 points   What month? 0 points 0 points 0 points  What time? 0 points 0 points   Count back from 20 0 points 0 points 0 points  Months in reverse 0 points 0 points 0 points  Repeat phrase 0 points 2 points 0 points  Total Score 0 points 2 points     Immunizations Immunization History  Administered Date(s) Administered   COVID-19, mRNA, vaccine(Comirnaty)12 years and older 11/29/2021   Fluad Quad(high Dose 65+) 11/20/2018, 11/28/2020, 10/23/2021   Influenza Split 01/24/2012   Influenza, High Dose Seasonal PF 12/02/2013, 01/20/2015, 12/28/2016, 10/26/2019   Influenza,inj,Quad PF,6+ Mos 11/21/2015, 11/18/2017   Influenza-Unspecified 01/24/2012, 01/05/2014, 01/05/2014, 01/20/2015   PFIZER Comirnaty(Gray Top)Covid-19 Tri-Sucrose Vaccine 05/20/2020   PFIZER(Purple Top)SARS-COV-2 Vaccination 03/01/2019, 03/22/2019, 11/16/2019   Pfizer Covid-19 Vaccine Bivalent Booster 64yrs & up 12/16/2020   Pneumococcal Conjugate-13 03/06/2015, 03/06/2015   Pneumococcal Polysaccharide-23 06/18/2016   Zoster Recombinat (Shingrix) 03/09/2018, 08/21/2018   Zoster, Live 10/09/2012  Flu Vaccine status: Up to date  Pneumococcal vaccine status: Up to date  Covid-19 vaccine status: Completed vaccines  Qualifies for Shingles Vaccine? Yes   Zostavax completed Yes   Shingrix Completed?: Yes  Screening Tests Health Maintenance  Topic Date Due   OPHTHALMOLOGY EXAM  02/26/2022   COVID-19 Vaccine (7 - 2023-24 season) 06/23/2022 (Originally 01/24/2022)   INFLUENZA VACCINE  09/09/2022   HEMOGLOBIN A1C  10/13/2022   Diabetic kidney evaluation - Urine ACR  10/24/2022   FOOT EXAM  10/24/2022   DEXA SCAN  11/14/2022   Diabetic kidney evaluation - eGFR measurement  05/09/2023   Medicare Annual Wellness (AWV)  06/22/2023   Pneumonia Vaccine 29+ Years old  Completed   Zoster Vaccines- Shingrix  Completed   HPV VACCINES  Aged Out   DTaP/Tdap/Td  Discontinued    Health Maintenance  Health Maintenance Due   Topic Date Due   OPHTHALMOLOGY EXAM  02/26/2022    Colorectal cancer screening: No longer required.   Mammogram status: No longer required due to age.     Additional Screening:    Vision Screening: Recommended annual ophthalmology exams for early detection of glaucoma and other disorders of the eye. Is the patient up to date with their annual eye exam?  No  Who is the provider or what is the name of the office in which the patient attends annual eye exams? Dr Jimmy Footman  If pt is not established with a provider, would they like to be referred to a provider to establish care? No .   Dental Screening: Recommended annual dental exams for proper oral hygiene  Community Resource Referral / Chronic Care Management: CRR required this visit?  No   CCM required this visit?  Yes      Plan:     I have personally reviewed and noted the following in the patient's chart:   Medical and social history Use of alcohol, tobacco or illicit drugs  Current medications and supplements including opioid prescriptions. Patient is not currently taking opioid prescriptions. Functional ability and status Nutritional status Physical activity Advanced directives List of other physicians Hospitalizations, surgeries, and ER visits in previous 12 months Vitals Screenings to include cognitive, depression, and falls Referrals and appointments  In addition, I have reviewed and discussed with patient certain preventive protocols, quality metrics, and best practice recommendations. A written personalized care plan for preventive services as well as general preventive health recommendations were provided to patient.     Marzella Schlein, LPN   [id number]/61/0960   Nurse Notes: pt is requesting nutritional information and counseling

## 2022-06-26 ENCOUNTER — Other Ambulatory Visit: Payer: Self-pay | Admitting: Family Medicine

## 2022-06-28 ENCOUNTER — Encounter: Payer: Self-pay | Admitting: Family Medicine

## 2022-06-28 ENCOUNTER — Ambulatory Visit (INDEPENDENT_AMBULATORY_CARE_PROVIDER_SITE_OTHER): Payer: Medicare Other | Admitting: Family Medicine

## 2022-06-28 VITALS — BP 150/64 | HR 92 | Temp 97.9°F | Ht 61.0 in | Wt 143.6 lb

## 2022-06-28 DIAGNOSIS — Z7901 Long term (current) use of anticoagulants: Secondary | ICD-10-CM | POA: Diagnosis not present

## 2022-06-28 DIAGNOSIS — N170 Acute kidney failure with tubular necrosis: Secondary | ICD-10-CM | POA: Diagnosis not present

## 2022-06-28 DIAGNOSIS — I5032 Chronic diastolic (congestive) heart failure: Secondary | ICD-10-CM

## 2022-06-28 DIAGNOSIS — I10 Essential (primary) hypertension: Secondary | ICD-10-CM

## 2022-06-28 DIAGNOSIS — M109 Gout, unspecified: Secondary | ICD-10-CM | POA: Diagnosis not present

## 2022-06-28 LAB — COMPREHENSIVE METABOLIC PANEL
ALT: 19 U/L (ref 0–35)
AST: 27 U/L (ref 0–37)
Albumin: 3.7 g/dL (ref 3.5–5.2)
Alkaline Phosphatase: 81 U/L (ref 39–117)
BUN: 23 mg/dL (ref 6–23)
CO2: 31 mEq/L (ref 19–32)
Calcium: 9.5 mg/dL (ref 8.4–10.5)
Chloride: 103 mEq/L (ref 96–112)
Creatinine, Ser: 1.47 mg/dL — ABNORMAL HIGH (ref 0.40–1.20)
GFR: 32.35 mL/min — ABNORMAL LOW (ref >60.00)
Glucose, Bld: 184 mg/dL — ABNORMAL HIGH (ref 70–99)
Potassium: 4.6 mEq/L (ref 3.5–5.1)
Sodium: 143 mEq/L (ref 135–145)
Total Bilirubin: 0.7 mg/dL (ref 0.2–1.2)
Total Protein: 7 g/dL (ref 6.0–8.3)

## 2022-06-28 MED ORDER — APIXABAN 2.5 MG PO TABS: 2.5000 mg | ORAL_TABLET | Freq: Two times a day (BID) | ORAL | 3 refills | Status: DC

## 2022-06-28 MED ORDER — HYDROCHLOROTHIAZIDE 12.5 MG PO CAPS: 12.5000 mg | ORAL_CAPSULE | Freq: Every day | ORAL | 3 refills | Status: DC

## 2022-06-28 MED ORDER — APIXABAN 2.5 MG PO TABS: 2.5000 mg | ORAL_TABLET | Freq: Two times a day (BID) | ORAL | 0 refills | Status: DC

## 2022-06-28 NOTE — Progress Notes (Signed)
See my chart note.

## 2022-06-28 NOTE — Patient Instructions (Signed)
Please return in September for your annual complete physical; please come fasting.   I will release your lab results to you on your MyChart account with further instructions. You may see the results before I do, but when I review them I will send you a message with my report or have my assistant call you if things need to be discussed. Please reply to my message with any questions. Thank you!   Please restart hctz 12.5 mg capsules today.  We are stopping the furosemide and potassium.   If you have any questions or concerns, please don't hesitate to send me a message via MyChart or call the office at [phone]. Thank you for visiting with [place] today! It's our pleasure caring for you.

## 2022-06-28 NOTE — Progress Notes (Signed)
Subjective  CC:  Chief Complaint  Patient presents with   Edema   Congestive Heart Failure   Gout    HPI: Nicole Jimenez is a 88 y.o. female who presents to the office today to address the problems listed above in the chief complaint. Hypertension f/u: Control is fair . Pt reports she is doing well.  Patient is taking diltiazem 180 mg daily.  Lisinopril HCTZ was stopped several months ago due to ATN.  Not taking home blood pressures.. No chest pain or shortness of breath.  She does have Lower extremity.She denies adverse effects from his BP medications. Compliance with medication is good.  Diastolic heart failure with lower extremity edema treated with several days of Lasix about a month ago.  Fortunately this is worked well.  She has mild dependent edema at night.  No longer on Lasix or potassium.  She had been on hydrochlorothiazide in the past for blood pressure. Acute tubular necrosis: Due for follow-up on renal function. Lab Results  Component Value Date   CREATININE 1.71 (H) 05/09/2022   BUN 26 (H) 05/09/2022   NA 139 05/09/2022   K 3.6 05/09/2022   CL 103 05/09/2022   CO2 23 05/09/2022   Gout has resolved with prednisone treatment.  Now feeling well. Request refill for long-term Eliquis.  No bleeds.    Assessment  1. Essential hypertension   2. Chronic diastolic CHF (congestive heart failure) (HCC)   3. ATN (acute tubular necrosis) (HCC)   4. Anticoagulant long-term use   5. Acute gout involving toe of right foot, unspecified cause      Plan   Hypertension f/u: BP control is fairly well controlled.  Will add back hydrochlorothiazide 12.5 mg daily till diltiazem 180 mg daily.  Recheck renal function and potassium levels today. Recheck renal function Hydrochlorothiazide to help with lower extremity edema due to diastolic heart failure. Refilled Eliquis. Gout has resolved  Education regarding management of these chronic disease states was given. Management  strategies discussed on successive visits include dietary and exercise recommendations, goals of achieving and maintaining IBW, and lifestyle modifications aiming for adequate sleep and minimizing stressors.   Follow up: 3 months to recheck blood pressure and complete physical with lab work  Orders Placed This Encounter  Procedures   Comprehensive metabolic panel   Meds ordered this encounter  Medications   apixaban (ELIQUIS) 2.5 MG TABS tablet    Sig: Take 1 tablet (2.5 mg total) by mouth 2 (two) times daily.    Dispense:  180 tablet    Refill:  3   hydrochlorothiazide (MICROZIDE) 12.5 MG capsule    Sig: Take 1 capsule (12.5 mg total) by mouth daily.    Dispense:  90 capsule    Refill:  3      BP Readings from Last 3 Encounters:  06/28/22 (!) 150/64  06/07/22 138/64  05/12/22 138/70   Wt Readings from Last 3 Encounters:  06/28/22 143 lb 9.6 oz (65.1 kg)  06/22/22 140 lb (63.5 kg)  06/07/22 146 lb 9.6 oz (66.5 kg)    Lab Results  Component Value Date   CHOL 176 10/23/2021   CHOL 177 04/17/2021   CHOL 175 05/19/2020   Lab Results  Component Value Date   HDL 71.50 10/23/2021   HDL 73.10 04/17/2021   HDL 75.10 05/19/2020   Lab Results  Component Value Date   LDLCALC 84 10/23/2021   LDLCALC 80 04/17/2021   LDLCALC 76 05/19/2020   Lab  Results  Component Value Date   TRIG 106.0 10/23/2021   TRIG 122.0 04/17/2021   TRIG 116.0 05/19/2020   Lab Results  Component Value Date   CHOLHDL 2 10/23/2021   CHOLHDL 2 04/17/2021   CHOLHDL 2 05/19/2020   No results found for: "LDLDIRECT" Lab Results  Component Value Date   CREATININE 1.71 (H) 05/09/2022   BUN 26 (H) 05/09/2022   NA 139 05/09/2022   K 3.6 05/09/2022   CL 103 05/09/2022   CO2 23 05/09/2022    The ASCVD Risk score (Arnett DK, et al., 2019) failed to calculate for the following reasons:   The 2019 ASCVD risk score is only valid for ages 92 to 59  I reviewed the patients updated PMH, FH, and SocHx.     Patient Active Problem List   Diagnosis Date Noted   History of arterial embolism 08/21/2018    Priority: High   Anticoagulant long-term use 03/06/2018    Priority: High   Permanent atrial fibrillation (HCC) 01/23/2018    Priority: High   Chronic diastolic CHF (congestive heart failure) (HCC) 03/03/2017    Priority: High   Type 2 diabetes mellitus with peripheral neuropathy (HCC) 01/24/2017    Priority: High   Subclinical hyperthyroidism 01/21/2014    Priority: High   Essential hypertension 02/23/2013    Priority: High   Combined hyperlipidemia associated with type 2 diabetes mellitus (HCC) 02/23/2013    Priority: High   Major depression, recurrent, chronic (HCC) 07/18/2012    Priority: High   DJD of right shoulder 01/24/2017    Priority: Medium    Bereavement due to life event 03/05/2016    Priority: Medium    Osteopenia 08/23/2015    Priority: Medium    Idiopathic scoliosis of thoracic spine 07/09/2014    Priority: Medium    Sjogren's syndrome (HCC) 08/04/2012    Priority: Medium    DDD (degenerative disc disease), lumbosacral 07/15/2012    Priority: Medium    Lumbosacral spondylosis 07/15/2012    Priority: Medium    Osteoarthritis of knee 07/15/2012    Priority: Medium    Bilateral edema of lower extremity 01/26/2017    Priority: Low   Bilateral hip bursitis 07/17/2015    Priority: Low   Age-related nuclear cataract of both eyes 03/11/2015    Priority: Low   Insomnia 09/04/2013    Priority: Low   Allergic rhinitis 07/15/2012    Priority: Low   Acute gout involving toe of right foot 06/28/2022   AKI (acute kidney injury) (HCC) 04/12/2022    Allergies: Amoxicillin-pot clavulanate, Amoxicillin, and Clarithromycin  Social History: Patient  reports that she has never smoked. She has never used smokeless tobacco. She reports that she does not drink alcohol and does not use drugs.  Current Meds  Medication Sig   Acetaminophen 500 MG coapsule Take 1,000 mg by  mouth every 6 (six) hours as needed for pain.   Calcium Carb-Cholecalciferol 600-800 MG-UNIT CHEW Chew 1 each by mouth daily.    cycloSPORINE (RESTASIS) 0.05 % ophthalmic emulsion 1 drop 2 (two) times daily.   diclofenac sodium (VOLTAREN) 1 % GEL APPLY 2 G TOPICALLY 4 (FOUR) TIMES DAILY AS NEEDED. **PA DENIED** (Patient taking differently: Apply 2 g topically daily as needed (For pain).)   diltiazem (CARDIZEM CD) 180 MG 24 hr capsule TAKE 1 CAPSULE (180 MG TOTAL) BY MOUTH EVERY MORNING   Glucosamine-MSM-Hyaluronic Acd (JOINT HEALTH PO) Take 1 tablet by mouth daily.   glucose blood (ONETOUCH ULTRA)  test strip USE TWICE DAILY TO CHECK BLOOD SUGAR - E11.40   hydrochlorothiazide (MICROZIDE) 12.5 MG capsule Take 1 capsule (12.5 mg total) by mouth daily.   Krill Oil 500 MG CAPS Take 500 mg by mouth daily.   loperamide (IMODIUM) 2 MG capsule Take 1 capsule (2 mg total) by mouth as needed for diarrhea or loose stools.   Multiple Vitamins-Minerals (MULTIVITAMIN ADULT PO) Take by mouth.   ONETOUCH ULTRA test strip USE AS INSTRUCTED   rosuvastatin (CRESTOR) 40 MG tablet Take 1 tablet (40 mg total) by mouth daily.   vortioxetine HBr (TRINTELLIX) 10 MG TABS tablet Take 1 tablet (10 mg total) by mouth daily.   [DISCONTINUED] apixaban (ELIQUIS) 2.5 MG TABS tablet Take 1 tablet (2.5 mg total) by mouth 2 (two) times daily.    Review of Systems: Cardiovascular: negative for chest pain, palpitations, leg swelling, orthopnea Respiratory: negative for SOB, wheezing or persistent cough Gastrointestinal: negative for abdominal pain Genitourinary: negative for dysuria or gross hematuria  Objective  Vitals: BP (!) 150/64   Pulse 92   Temp 97.9 F (36.6 C)   Ht 5\' 1"  (1.549 m)   Wt 143 lb 9.6 oz (65.1 kg)   SpO2 96%   BMI 27.13 kg/m  General: no acute distress  Psych:  Alert and oriented, normal mood and affect HEENT:  Normocephalic, atraumatic, supple neck  Cardiovascular:  RRR without murmur.  Trace  bilateral lower extremity edema, much improved Respiratory:  Good breath sounds bilaterally, CTAB with normal respiratory effort Skin:  Warm, no rashes Neurologic:   Mental status is normal Commons side effects, risks, benefits, and alternatives for medications and treatment plan prescribed today were discussed, and the patient expressed understanding of the given instructions. Patient is instructed to call or message via MyChart if he/she has any questions or concerns regarding our treatment plan. No barriers to understanding were identified. We discussed Red Flag symptoms and signs in detail. Patient expressed understanding regarding what to do in case of urgent or emergency type symptoms.  Medication list was reconciled, printed and provided to the patient in AVS. Patient instructions and summary information was reviewed with the patient as documented in the AVS. This note was prepared with assistance of Dragon voice recognition software. Occasional wrong-word or sound-a-like substitutions may have occurred due to the inherent limitation

## 2022-07-02 ENCOUNTER — Other Ambulatory Visit: Payer: Self-pay | Admitting: Family Medicine

## 2022-07-05 ENCOUNTER — Encounter: Payer: Self-pay | Admitting: Family Medicine

## 2022-07-06 ENCOUNTER — Other Ambulatory Visit: Payer: Self-pay

## 2022-07-06 MED ORDER — VORTIOXETINE HBR 10 MG PO TABS: 10.0000 mg | ORAL_TABLET | Freq: Every day | ORAL | 3 refills | Status: DC

## 2022-07-12 ENCOUNTER — Ambulatory Visit (INDEPENDENT_AMBULATORY_CARE_PROVIDER_SITE_OTHER): Payer: Medicare Other | Admitting: Cardiology

## 2022-07-12 ENCOUNTER — Encounter (HOSPITAL_BASED_OUTPATIENT_CLINIC_OR_DEPARTMENT_OTHER): Payer: Self-pay | Admitting: Cardiology

## 2022-07-12 VITALS — BP 148/88 | HR 103 | Ht 61.0 in | Wt 141.9 lb

## 2022-07-12 DIAGNOSIS — D6869 Other thrombophilia: Secondary | ICD-10-CM

## 2022-07-12 DIAGNOSIS — I4821 Permanent atrial fibrillation: Secondary | ICD-10-CM | POA: Diagnosis not present

## 2022-07-12 DIAGNOSIS — E1169 Type 2 diabetes mellitus with other specified complication: Secondary | ICD-10-CM | POA: Diagnosis not present

## 2022-07-12 DIAGNOSIS — E782 Mixed hyperlipidemia: Secondary | ICD-10-CM

## 2022-07-12 DIAGNOSIS — Z7901 Long term (current) use of anticoagulants: Secondary | ICD-10-CM

## 2022-07-12 DIAGNOSIS — I10 Essential (primary) hypertension: Secondary | ICD-10-CM

## 2022-07-12 DIAGNOSIS — Z86718 Personal history of other venous thrombosis and embolism: Secondary | ICD-10-CM

## 2022-07-12 DIAGNOSIS — Z9889 Other specified postprocedural states: Secondary | ICD-10-CM

## 2022-07-12 MED ORDER — CARVEDILOL 12.5 MG PO TABS: 12.5000 mg | ORAL_TABLET | Freq: Two times a day (BID) | ORAL | 3 refills | Status: DC

## 2022-07-12 NOTE — Patient Instructions (Addendum)
Medication Instructions:  START CARVEDILOL 12.5 MG TWICE A DAY   Labwork: NONE  Testing/Procedures: NONE  Follow-Up: 6-8 WEEKS WITH DR Cristal Deer   Any Other Special Instructions Will Be Listed Below (If Applicable).  how to check blood pressure:  -sit comfortably in a chair, feet uncrossed and flat on floor, for 5-10 minutes  -arm ideally should rest at the level of the heart. However, arm should be relaxed and not tense (for example, do not hold the arm up unsupported)  -avoid exercise, caffeine, and tobacco for at least 30 minutes prior to BP reading  -don't take BP cuff reading over clothes (always place on skin directly)  -I prefer to know how well the medication is working, so I would like you to take your readings 1-2 hours after taking your blood pressure medication if possible

## 2022-07-12 NOTE — Progress Notes (Signed)
Cardiology Office Note:    Date:  07/12/2022   ID:  VINCENT KUNG, DOB 04-Mar-1934, MRN [id number]  PCP:  Willow Ora, MD  Cardiologist:  Jodelle Red, MD  Referring MD: Willow Ora, MD   CC: Follow-up  History of Present Illness:    Nicole Jimenez is a 88 y.o. 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 She was initially seen 06/11/2021 as a new consult at the request of Willow Ora, MD for the evaluation and management of permanent atrial fibrillation.  Cardiovascular risk factors: Prior clinical ASCVD:  DC Cardioversion 03/21/2018, she confirms that she was unable to tell if this procedure was successful. She never felt symptoms such as palpitations. She reports having a blood clot previously while she was in [place]. Comorbid conditions: Hypertension, Hyperlipidemia, Diabetes  Prior cardiac testing and/or incidental findings on other testing (ie coronary calcium): She notes that the majority of her providers were at [facility] previously. TTE 02/2018 with normal LV function and mild MR/TR.  At her last visit, she was concerned about how low her BP was in office (128/72). She was also losing weight unintentionally with a loss of appetite and quick satiety. She was struggling with depression. She was on Trintellix which had helped before; however she hadn't had her medication in 3 weeks. She thought it was not helping initially but while off Trintellix her depression worsened.   Today, she states she is feeling good. She is concerned about her high blood pressure in the office, which is 164/92 initially and 148/88 on recheck. She did feel stressed with needing to park on a different floor of the parking garage which was a little disorienting.  Lately she has not been monitoring her BP at home. Her blood pressure was also elevated at her last visit with her PCP 06/2022; they added back HCTZ 12.5 mg. Also took lasix a couple days which resolved her worsening swelling. After sitting down for too long, she develops swelling in her feet, initially in the top of her right foot. She does relate this to her prior femoral endarterectomy. By the next morning her swelling usually resolves.   Breathing has been normal. Lately she feels her stress levels have improved. 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   She takes her morning medications between morning and noon, whenever she is up. At night she takes the Eliquis and supplements prior to bedtime.  She denies any palpitations, chest pain, shortness of breath, lightheadedness, headaches, syncope, orthopnea, or PND.   Past Medical History:  Diagnosis Date   Arthritis    Depression    Diabetes mellitus without complication (HCC)    Borderline   Diastolic CHF (HCC) 03/03/2017   Echo 11/2016, nl EF but LVH and diastolic dysfunction   GERD (gastroesophageal reflux disease)    Hyperlipidemia    Hypertension    Peripheral arterial disease (HCC) 2018   thromboembolism of femoral artery   Thyroid disease    subclinical hyperthyroidism managed by endocrinology    Past Surgical History:  Procedure Laterality Date   FEMORAL ENDARTERECTOMY Right 11/29/2016    Current Medications: Current Outpatient Medications on File Prior to Visit  Medication Sig   Acetaminophen 500 MG coapsule Take 1,000 mg by mouth every 6 (six) hours as needed for pain.  apixaban (ELIQUIS) 2.5 MG TABS tablet Take 1 tablet (2.5 mg total) by mouth 2 (two) times daily.   Calcium Carb-Cholecalciferol 600-800 MG-UNIT CHEW Chew 1 each by mouth daily.    cycloSPORINE (RESTASIS) 0.05 % ophthalmic emulsion 1 drop 2 (two) times daily.   diclofenac sodium (VOLTAREN) 1 % GEL APPLY 2 G TOPICALLY 4 (FOUR)  TIMES DAILY AS NEEDED. **PA DENIED** (Patient taking differently: Apply 2 g topically daily as needed (For pain).)   diltiazem (CARDIZEM CD) 180 MG 24 hr capsule TAKE 1 CAPSULE (180 MG TOTAL) BY MOUTH EVERY MORNING   Glucosamine-MSM-Hyaluronic Acd (JOINT HEALTH PO) Take 1 tablet by mouth daily.   glucose blood (ONETOUCH ULTRA) test strip USE TWICE DAILY TO CHECK BLOOD SUGAR - E11.40   hydrochlorothiazide (MICROZIDE) 12.5 MG capsule Take 1 capsule (12.5 mg total) by mouth daily.   Krill Oil 500 MG CAPS Take 500 mg by mouth daily.   loperamide (IMODIUM) 2 MG capsule Take 1 capsule (2 mg total) by mouth as needed for diarrhea or loose stools.   Multiple Vitamins-Minerals (MULTIVITAMIN ADULT PO) Take by mouth.   ONETOUCH ULTRA test strip USE AS INSTRUCTED   rosuvastatin (CRESTOR) 40 MG tablet Take 1 tablet (40 mg total) by mouth daily.   vortioxetine HBr (TRINTELLIX) 10 MG TABS tablet Take 1 tablet (10 mg total) by mouth daily.   No current facility-administered medications on file prior to visit.     Allergies:   Amoxicillin-pot clavulanate, Amoxicillin, and Clarithromycin   Social History   Tobacco Use   Smoking status: Never   Smokeless tobacco: Never  Vaping Use   Vaping Use: Never used  Substance Use Topics   Alcohol use: No   Drug use: No    Family History: family history includes Arthritis in her mother and sister; Diabetes in her father and sister; Healthy in her daughter, son, and son; Heart attack in her father and mother; Hyperlipidemia in her father; Other in her son.  ROS:   Please see the history of present illness. (+) Stress (+) LE edema R>L All other systems are reviewed and negative.    EKGs/Labs/Other Studies Reviewed:    The following studies were reviewed today:  Card[facility]version 03/21/2018 [facility]): IMPRESSION: Successful direct current cardioversion with restoration of  sinus rhythm from atrial fibrillation with no immediate complication.   TTE  02/17/2018: Summary  Normal left ventricular size and systolic function with no appreciable  segmental abnormality.  Ejection fraction is visually estimated at 60-65%.  Mild left ventricular hypertrophy.  Mild tricuspid regurgitation.  Mild mitral regurgitation.  Normal size left atrium.   ABI, LE Arterial and Venous Dopplers of 2019.  CTA Abdominal Aorta and Bilateral Iliofemoral Runoff ([facility]) 11/28/2016: VASCULAR:  - Focal occlusion of the right common femoral artery over 2.5 cm, with apparent collateral reconstitution distally.  - No abdominal aortic aneurysm, dissection, or significant plaque is identified.   IMPRESSION:  Right common femoral artery occlusion, appears embolic.   EKG:  EKG is personally reviewed. 07/12/2022:  atrial fibrillation at 103 bpm 03/30/2022: not ordered 12/29/21: not ordered 09/24/2021:  atrial fibrillation at 72 bpm   06/11/2021: atrial fibrillation at 60 bpm  Recent Labs: 04/05/2022: TSH 0.90 04/15/2022: B Natriuretic Peptide 808.7 04/19/2022: Magnesium 1.7 05/09/2022: Hemoglobin 11.5; Platelets 182 06/28/2022: ALT 19; BUN 23; Creatinine, Ser 1.47; Potassium 4.6; Sodium 143   Recent Lipid Panel    Component Value Date/Time   CHOL 176 10/23/2021 1347   TRIG 106.0 10/23/2021 1347  HDL 71.50 10/23/2021 1347   CHOLHDL 2 10/23/2021 1347   VLDL 21.2 10/23/2021 1347   LDLCALC 84 10/23/2021 1347   LDLCALC 72 01/24/2017 1621    Physical Exam:    VS:  BP (!) 148/88 (BP Location: Left Arm, Patient Position: Sitting, Cuff Size: Normal)   Pulse (!) 103   Ht 5\' 1"  (1.549 m)   Wt 141 lb 14.4 oz (64.4 kg)   BMI 26.81 kg/m     Wt Readings from Last 3 Encounters:  07/12/22 141 lb 14.4 oz (64.4 kg)  06/28/22 143 lb 9.6 oz (65.1 kg)  06/22/22 140 lb (63.5 kg)    GEN: Well nourished, well developed in no acute distress HEENT: Normal, moist mucous membranes NECK: No JVD CARDIAC: irregularly irregular rhythm, normal S1 and S2, no rubs or gallops. No  murmur. VASCULAR: Radial and DP pulses 2+ bilaterally. No carotid bruits RESPIRATORY:  Clear to auscultation without rales, wheezing or rhonchi  ABDOMEN: Soft, non-tender, non-distended MUSCULOSKELETAL:  Ambulates independently SKIN: Warm and dry, no significant LE edema NEUROLOGIC:  Alert and oriented x 3. No focal neuro deficits noted. PSYCHIATRIC:  Normal affect    ASSESSMENT:    1. Permanent atrial fibrillation (HCC)   2. History of arterial embolism   3. History of embolectomy   4. Secondary hypercoagulable state (HCC)   5. Long term current use of anticoagulant   6. Essential hypertension   7. Combined hyperlipidemia associated with type 2 diabetes mellitus (HCC)     PLAN:    Permanent atrial fibrillation -CHA2DS2-VASc Score = 8 -Also with prior embolic occlusion of femoral artery, but this was prior to diagnosis of afib/anticoagulation -continue apixaban 5 mg BID -continue atenolol, diltiazem  Hypertension -above goal today. Discussed options. Atenolol, lisinopril and HCTZ stopped 04/2022 during admission for AKI/ATN. HCTZ has been restarted -Will start mid dose carvedilol today. If rate controlled at follow up, could change diltiazem to amlodipine for better BP control -stressed salt avoidance. She is working on activity  Type II diabetes Hyperlipidemia -continue rosuvastatin -no aspirin as she is on apixaban -no longer on metformin  Cardiac risk counseling and prevention recommendations: -recommend heart healthy/Mediterranean diet, with whole grains, fruits, vegetable, fish, lean meats, nuts, and olive oil. Limit salt. -recommend moderate walking, 3-5 times/week for 30-50 minutes each session. Aim for at least 150 minutes.week. Goal should be pace of 3 miles/hours, or walking 1.5 miles in 30 minutes -recommend avoidance of tobacco products. Avoid excess alcohol.  Plan for follow up: 6-8 weeks or sooner as needed.  Jodelle Red, MD, PhD, [facility]   [facility]    Medication Adjustments/Labs and Tests Ordered: Current medicines are reviewed at length with the patient today.  Concerns regarding medicines are outlined above.   Orders Placed This Encounter  Procedures   EKG 12-Lead   Meds ordered this encounter  Medications   carvedilol (COREG) 12.5 MG tablet    Sig: Take 1 tablet (12.5 mg total) by mouth 2 (two) times daily.    Dispense:  180 tablet    Refill:  3   Patient Instructions   Medication Instructions:  START CARVEDILOL 12.5 MG TWICE A DAY   Labwork: NONE  Testing/Procedures: NONE  Follow-Up: 6-8 WEEKS WITH DR Cristal Deer   Any Other Special Instructions Will Be Listed Below (If Applicable).  how to check blood pressure:  -sit comfortably in a chair, feet uncrossed and flat on floor, for 5-10 minutes  -arm ideally should rest at the level  of the heart. However, arm should be relaxed and not tense (for example, do not hold the arm up unsupported)  -avoid exercise, caffeine, and tobacco for at least 30 minutes prior to BP reading  -don't take BP cuff reading over clothes (always place on skin directly)  -I prefer to know how well the medication is working, so I would like you to take your readings 1-2 hours after taking your blood pressure medication if possible    I,Mathew Stumpf,acting as a scribe for [employer], MD.,have documented all relevant documentation on the behalf of Jodelle Red, MD,as directed by  Jodelle Red, MD while in the presence of Jodelle Red, MD.  I, Jodelle Red, MD, have reviewed all documentation for this visit. The documentation on 07/12/22 for the exam, diagnosis, procedures, and orders are all accurate and complete.   Signed, Jodelle Red, MD PhD 07/12/2022 1:41 PM    [facility]

## 2022-07-15 ENCOUNTER — Other Ambulatory Visit: Payer: Self-pay | Admitting: Family Medicine

## 2022-09-02 ENCOUNTER — Other Ambulatory Visit: Payer: Self-pay | Admitting: Family Medicine

## 2022-09-06 ENCOUNTER — Encounter (HOSPITAL_BASED_OUTPATIENT_CLINIC_OR_DEPARTMENT_OTHER): Payer: Self-pay | Admitting: Cardiology

## 2022-09-06 ENCOUNTER — Ambulatory Visit (INDEPENDENT_AMBULATORY_CARE_PROVIDER_SITE_OTHER): Payer: Medicare Other | Admitting: Cardiology

## 2022-09-06 VITALS — BP 134/72 | HR 64 | Ht 61.0 in | Wt 143.4 lb

## 2022-09-06 DIAGNOSIS — Z7901 Long term (current) use of anticoagulants: Secondary | ICD-10-CM

## 2022-09-06 DIAGNOSIS — E1169 Type 2 diabetes mellitus with other specified complication: Secondary | ICD-10-CM | POA: Diagnosis not present

## 2022-09-06 DIAGNOSIS — Z86718 Personal history of other venous thrombosis and embolism: Secondary | ICD-10-CM | POA: Diagnosis not present

## 2022-09-06 DIAGNOSIS — Z9889 Other specified postprocedural states: Secondary | ICD-10-CM

## 2022-09-06 DIAGNOSIS — D6869 Other thrombophilia: Secondary | ICD-10-CM

## 2022-09-06 DIAGNOSIS — I10 Essential (primary) hypertension: Secondary | ICD-10-CM | POA: Diagnosis not present

## 2022-09-06 DIAGNOSIS — I4821 Permanent atrial fibrillation: Secondary | ICD-10-CM

## 2022-09-06 DIAGNOSIS — E782 Mixed hyperlipidemia: Secondary | ICD-10-CM

## 2022-09-06 NOTE — Patient Instructions (Addendum)
Medication Instructions:  Your physician recommends that you continue on your current medications as directed. Please refer to the Current Medication list given to you today.   Labwork: NONE   Testing/Procedures: NONE  Follow-Up: 12/17/2022 3:00 PM WITH DR Cristal Deer   If you need a refill on your cardiac medications before your next appointment, please call your pharmacy.

## 2022-09-06 NOTE — Progress Notes (Signed)
Cardiology Office Note:  .    Date:  09/06/2022  ID:  Nicole Jimenez, DOB [birth year], MRN [id number] PCP: Willow Ora, MD  [facility] Providers Cardiologist:  Jodelle Red, MD     History of Present Illness: .    Nicole Jimenez is a 88 y.o. 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 She was initially seen 06/11/2021 as a new consult at the request of Willow Ora, MD for the evaluation and management of permanent atrial fibrillation.   Cardiovascular risk factors: Prior clinical ASCVD:  DC Cardioversion 03/21/2018, she confirms that she was unable to tell if this procedure was successful. She never felt symptoms such as palpitations. She reports having a blood clot previously while she was in [place]. Comorbid conditions: Hypertension, Hyperlipidemia, Diabetes  Prior cardiac testing and/or incidental findings on other testing (ie coronary calcium): She notes that the majority of her providers were at [facility] previously. TTE 02/2018 with normal LV function and mild MR/TR.   In 03/2022, she was concerned about how low her BP was in office (128/72). She was also losing weight unintentionally with a loss of appetite and quick satiety. She was struggling with depression. She was on Trintellix which had helped before; however she hadn't had her medication in 3 weeks. She thought it was not helping initially but while off Trintellix her depression worsened.    At her visit 07/2022, her blood pressure was 164/92 initially and 148/88 on recheck. Her blood pressure was also elevated at her visit with her PCP 06/2022; they added back HCTZ 12.5 mg. Had some worsening swelling resolved with taking Lasix for a couple days. Breathing had been normal. 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 She took her morning medications between morning and noon. At night she took the Eliquis and supplements. After discussion, we added mid dose carvedilol.  Today, she reports having mild lightheadedness this morning. She notes that she has been going to bed very late this past week, and her eyes have been watery despite using her Restasis.  In the office her blood pressure is initially 150/78, and 134/72 on manual recheck. She did drive in the rain on the way to her appointment today. She has not been monitoring her home blood pressures.  Continues to have intermittent swelling, usually in the top of her right foot. It feels as though she is wearing something like a sock, feels tight. Her swelling develops if she is sitting at the computer too long, standing too long, and is usually worse at the end of the day. After resting and elevation her swelling does improve.   She denies any palpitations, chest pain, shortness of breath, headaches, syncope, orthopnea, or PND.  ROS:  Please see the history of present illness. ROS otherwise negative except as noted.  (+) Mild lightheadedness (+) Intermittent LE edema (+) Watery eyes  Studies Reviewed: Marland Kitchen    EKG Interpretation Date/Time:  Monday September 06 2022 11:50:57 EDT Ventricular Rate:  64 PR Interval:    QRS Duration:  86 QT Interval:  428 QTC Calculation: 441 R Axis:   44  Text Interpretation: Atrial fibrillation Low voltage QRS No previous ECGs available Confirmed by Jodelle Red [phone]) on 09/06/2022 11:55:56 AM    Physical Exam:    VS:  BP  134/72 (BP Location: Right Arm, Patient Position: Sitting, Cuff Size: Normal)   Pulse 64   Ht 5\' 1"  (1.549 m)   Wt 143 lb 6.4 oz (65 kg)   BMI 27.10 kg/m    Wt Readings from Last 3 Encounters:  09/06/22 143 lb 6.4 oz (65 kg)  07/12/22 141 lb 14.4 oz (64.4 kg)  06/28/22 143 lb 9.6 oz (65.1 kg)    GEN: Well nourished, well developed in no  acute distress HEENT: Normal, moist mucous membranes NECK: No JVD CARDIAC: Irregularly irregular, normal S1 and S2, no rubs or gallops. No murmur. VASCULAR: Radial and DP pulses 2+ bilaterally. No carotid bruits RESPIRATORY:  Clear to auscultation without rales, wheezing or rhonchi  ABDOMEN: Soft, non-tender, non-distended MUSCULOSKELETAL:  Ambulates independently SKIN: Warm and dry, no edema NEUROLOGIC:  Alert and oriented x 3. No focal neuro deficits noted. PSYCHIATRIC:  Normal affect   ASSESSMENT AND PLAN: .    Permanent atrial fibrillation -CHA2DS2-VASc Score = 8 -Also with prior embolic occlusion of femoral artery, but this was prior to diagnosis of afib/anticoagulation -continue apixaban 5 mg BID -continue carvedilol, diltiazem   Hypertension -improved on recheck. Previously, lisinopril and HCTZ stopped 04/2022 during admission for AKI/ATN. HCTZ has been restarted -BP improved with change from atenolol to carvedilol -if additional BP control needed at follow up, could change diltiazem to amlodipine and monitor HR -stressed salt avoidance. She is working on activity   Type II diabetes Hyperlipidemia -continue rosuvastatin -no aspirin as she is on apixaban -no longer on metformin   Cardiac risk counseling and prevention recommendations: -recommend heart healthy/Mediterranean diet, with whole grains, fruits, vegetable, fish, lean meats, nuts, and olive oil. Limit salt. -recommend moderate walking, 3-5 times/week for 30-50 minutes each session. Aim for at least 150 minutes.week. Goal should be pace of 3 miles/hours, or walking 1.5 miles in 30 minutes -recommend avoidance of tobacco products. Avoid excess alcohol.  Dispo: Follow-up in 3 months, or sooner as needed.  I,Mathew Stumpf,acting as a Neurosurgeon for [employer], MD.,have documented all relevant documentation on the behalf of Jodelle Red, MD,as directed by  Jodelle Red, MD while in the presence  of Jodelle Red, MD.  I, Jodelle Red, MD, have reviewed all documentation for this visit. The documentation on 09/06/22 for the exam, diagnosis, procedures, and orders are all accurate and complete.   Signed, Jodelle Red, MD

## 2022-09-23 ENCOUNTER — Encounter (INDEPENDENT_AMBULATORY_CARE_PROVIDER_SITE_OTHER): Payer: Self-pay

## 2022-09-30 ENCOUNTER — Other Ambulatory Visit: Payer: Self-pay | Admitting: Family Medicine

## 2022-10-03 ENCOUNTER — Encounter (HOSPITAL_BASED_OUTPATIENT_CLINIC_OR_DEPARTMENT_OTHER): Payer: Self-pay

## 2022-10-15 ENCOUNTER — Other Ambulatory Visit: Payer: Self-pay

## 2022-10-15 ENCOUNTER — Encounter: Payer: Self-pay | Admitting: Family Medicine

## 2022-10-15 MED ORDER — APIXABAN 2.5 MG PO TABS: 2.5000 mg | ORAL_TABLET | Freq: Two times a day (BID) | ORAL | 3 refills | Status: DC

## 2022-11-01 ENCOUNTER — Ambulatory Visit (INDEPENDENT_AMBULATORY_CARE_PROVIDER_SITE_OTHER): Payer: Medicare Other | Admitting: Family Medicine

## 2022-11-01 ENCOUNTER — Encounter: Payer: Self-pay | Admitting: Family Medicine

## 2022-11-01 VITALS — BP 136/72 | HR 76 | Temp 98.2°F | Ht 61.0 in | Wt 139.6 lb

## 2022-11-01 DIAGNOSIS — E1142 Type 2 diabetes mellitus with diabetic polyneuropathy: Secondary | ICD-10-CM

## 2022-11-01 DIAGNOSIS — E0590 Thyrotoxicosis, unspecified without thyrotoxic crisis or storm: Secondary | ICD-10-CM | POA: Diagnosis not present

## 2022-11-01 DIAGNOSIS — I5032 Chronic diastolic (congestive) heart failure: Secondary | ICD-10-CM

## 2022-11-01 DIAGNOSIS — Z780 Asymptomatic menopausal state: Secondary | ICD-10-CM

## 2022-11-01 DIAGNOSIS — Z23 Encounter for immunization: Secondary | ICD-10-CM

## 2022-11-01 DIAGNOSIS — M1000 Idiopathic gout, unspecified site: Secondary | ICD-10-CM

## 2022-11-01 DIAGNOSIS — E782 Mixed hyperlipidemia: Secondary | ICD-10-CM | POA: Diagnosis not present

## 2022-11-01 DIAGNOSIS — I4821 Permanent atrial fibrillation: Secondary | ICD-10-CM

## 2022-11-01 DIAGNOSIS — E1169 Type 2 diabetes mellitus with other specified complication: Secondary | ICD-10-CM

## 2022-11-01 DIAGNOSIS — I10 Essential (primary) hypertension: Secondary | ICD-10-CM

## 2022-11-01 DIAGNOSIS — F339 Major depressive disorder, recurrent, unspecified: Secondary | ICD-10-CM

## 2022-11-01 DIAGNOSIS — Z7901 Long term (current) use of anticoagulants: Secondary | ICD-10-CM

## 2022-11-01 DIAGNOSIS — M8580 Other specified disorders of bone density and structure, unspecified site: Secondary | ICD-10-CM

## 2022-11-01 DIAGNOSIS — R600 Localized edema: Secondary | ICD-10-CM

## 2022-11-01 DIAGNOSIS — M3500 Sicca syndrome, unspecified: Secondary | ICD-10-CM

## 2022-11-01 DIAGNOSIS — M109 Gout, unspecified: Secondary | ICD-10-CM | POA: Insufficient documentation

## 2022-11-01 LAB — COMPREHENSIVE METABOLIC PANEL
ALT: 22 U/L (ref 0–35)
AST: 27 U/L (ref 0–37)
Albumin: 4.1 g/dL (ref 3.5–5.2)
Alkaline Phosphatase: 70 U/L (ref 39–117)
BUN: 29 mg/dL — ABNORMAL HIGH (ref 6–23)
CO2: 33 mEq/L — ABNORMAL HIGH (ref 19–32)
Calcium: 9.9 mg/dL (ref 8.4–10.5)
Chloride: 98 mEq/L (ref 96–112)
Creatinine, Ser: 1.61 mg/dL — ABNORMAL HIGH (ref 0.40–1.20)
GFR: 28.93 mL/min — ABNORMAL LOW (ref >60.00)
Glucose, Bld: 150 mg/dL — ABNORMAL HIGH (ref 70–99)
Potassium: 3.7 mEq/L (ref 3.5–5.1)
Sodium: 141 mEq/L (ref 135–145)
Total Bilirubin: 0.9 mg/dL (ref 0.2–1.2)
Total Protein: 7.8 g/dL (ref 6.0–8.3)

## 2022-11-01 LAB — URIC ACID: Uric Acid, Serum: 4.9 mg/dL (ref 2.4–7.0)

## 2022-11-01 LAB — LIPID PANEL
Cholesterol: 136 mg/dL (ref 0–200)
HDL: 65.7 mg/dL (ref >39.00)
LDL Cholesterol: 51 mg/dL (ref 0–99)
NonHDL: 70.73
Total CHOL/HDL Ratio: 2
Triglycerides: 97 mg/dL (ref 0.0–149.0)
VLDL: 19.4 mg/dL (ref 0.0–40.0)

## 2022-11-01 LAB — CBC WITH DIFFERENTIAL/PLATELET
Basophils Absolute: 0 10*3/uL (ref 0.0–0.1)
Basophils Relative: 0.7 % (ref 0.0–3.0)
Eosinophils Absolute: 0.1 10*3/uL (ref 0.0–0.7)
Eosinophils Relative: 2.5 % (ref 0.0–5.0)
HCT: 39.6 % (ref 36.0–46.0)
Hemoglobin: 12.9 g/dL (ref 12.0–15.0)
Lymphocytes Relative: 22.3 % (ref 12.0–46.0)
Lymphs Abs: 1.1 10*3/uL (ref 0.7–4.0)
MCHC: 32.5 g/dL (ref 30.0–36.0)
MCV: 88.3 fl (ref 78.0–100.0)
Monocytes Absolute: 0.4 10*3/uL (ref 0.1–1.0)
Monocytes Relative: 8.7 % (ref 3.0–12.0)
Neutro Abs: 3.1 10*3/uL (ref 1.4–7.7)
Neutrophils Relative %: 65.8 % (ref 43.0–77.0)
Platelets: 121 10*3/uL — ABNORMAL LOW (ref 150.0–400.0)
RBC: 4.48 Mil/uL (ref 3.87–5.11)
RDW: 15.2 % (ref 11.5–15.5)
WBC: 4.8 10*3/uL (ref 4.0–10.5)

## 2022-11-01 LAB — MICROALBUMIN / CREATININE URINE RATIO
Creatinine,U: 65.1 mg/dL
Microalb Creat Ratio: 37.9 mg/g — ABNORMAL HIGH (ref 0.0–30.0)
Microalb, Ur: 24.7 mg/dL — ABNORMAL HIGH (ref 0.0–1.9)

## 2022-11-01 LAB — TSH: TSH: 1.01 u[IU]/mL (ref 0.35–5.50)

## 2022-11-01 LAB — HEMOGLOBIN A1C: Hgb A1c MFr Bld: 7.3 % — ABNORMAL HIGH (ref 4.6–6.5)

## 2022-11-01 MED ORDER — ROSUVASTATIN CALCIUM 40 MG PO TABS: 40.0000 mg | ORAL_TABLET | Freq: Every day | ORAL | 3 refills | Status: DC

## 2022-11-01 NOTE — Patient Instructions (Signed)
Please return in 6 months for recheck  If you have any questions or concerns, please don't hesitate to send me a message via MyChart or call the office at [phone]. Thank you for visiting with [place] today! It's our pleasure caring for you.

## 2022-11-01 NOTE — Progress Notes (Signed)
Subjective  Chief Complaint  Patient presents with   Annual Exam    Pt here for Annual Exam and is currently fasting. Eye exam is scheduled for 11/24   Hypertension   Diabetes    HPI: Nicole Jimenez is a 88 y.o. female who presents to [facility] [place] today for a Female Wellness Visit. She also has the concerns and/or needs as listed above in the chief complaint. These will be addressed in addition to the Health Maintenance Visit.   Wellness Visit: annual visit with health maintenance review and exam  Health maintenance: Eye exam is scheduled for November.  Bone density due in November to follow-up on osteopenia.  Patient never took Fosamax. Chronic disease f/u and/or acute problem visit: (deemed necessary to be done in addition to the wellness visit): Multiple medical problems reviewed.  No significant changes in how she is doing.  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  Taking her blood pressure and cholesterol medications without side effects.  No significant lower extremity edema.  She takes 20 mg of Crestor nightly.  Sees cardiology, chronic A-fib on anticoagulation without chest pain or palpitations.  No shortness of breath.  Trintellix 10 for depression.  No recent gout flares.  No falls.  No memory concerns.  Assessment  1. Type 2 diabetes mellitus with peripheral neuropathy (HCC)   2. Need for influenza vaccination   3. Essential hypertension   4. Combined hyperlipidemia associated with type 2 diabetes mellitus (HCC)   5. Chronic diastolic CHF (congestive heart failure) (HCC)   6. Subclinical hyperthyroidism   7. Permanent atrial fibrillation (HCC)   8. Anticoagulant long-term use   9. Osteopenia, unspecified location   10. Major depression, recurrent, chronic (HCC)   11. Bilateral edema of lower extremity   12. Asymptomatic menopausal state   13. Sjogren's syndrome without extraglandular involvement (HCC) Chronic  14. Idiopathic gout,  unspecified chronicity, unspecified site      Plan  Female Wellness Visit: Age appropriate Health Maintenance and Prevention measures were discussed with patient. Included topics are cancer screening recommendations, ways to keep healthy (see AVS) including dietary and exercise recommendations, regular eye and dental care, use of seat belts, and avoidance of moderate alcohol use and tobacco use.  Screens are current BMI: discussed patient's BMI and encouraged positive lifestyle modifications to help get to or maintain a target BMI. HM needs and immunizations were addressed and ordered. See below for orders. See HM and immunization section for updates.  Flu shot today Routine labs and screening tests ordered including cmp, cbc and lipids where appropriate. Discussed recommendations regarding Vit D and calcium supplementation (see AVS)  Chronic disease management visit and/or acute problem visit: Hypertension is controlled. Hyperlipidemia for recheck today on Crestor 20 Monitoring renal function, low-dose HCTZ. Osteopenia with elevated FRAX score: Never started Fosamax.  Will recheck bone density in November and then initiate treatment again if indicated. Continue Trintellix 10. Recheck uric acid without recent gout flares. No med changes made today.  I spent a total of 42 minutes for this patient encounter. Time spent included preparation, face-to-face counseling with the patient and coordination of care, review of chart and records, and documentation of the encounter.  Follow up: 6 months for recheck Orders Placed This Encounter  Procedures   DG Bone Density   Flu Vaccine Trivalent High Dose (Fluad)   CBC with Differential/Platelet   Comprehensive metabolic panel   Lipid panel   Hemoglobin A1c  TSH   Microalbumin / creatinine urine ratio   Uric acid   Meds ordered this encounter  Medications   rosuvastatin (CRESTOR) 40 MG tablet    Sig: Take 1 tablet (40 mg total) by mouth  daily.    Dispense:  90 tablet    Refill:  3      Body mass index is 26.38 kg/m. Wt Readings from Last 3 Encounters:  11/01/22 139 lb 9.6 oz (63.3 kg)  09/06/22 143 lb 6.4 oz (65 kg)  07/12/22 141 lb 14.4 oz (64.4 kg)     Patient Active Problem List   Diagnosis Date Noted Date Diagnosed   History of arterial embolism 08/21/2018     Priority: High    Managed by vascular. 2018    Anticoagulant long-term use 03/06/2018     Priority: High   Permanent atrial fibrillation (HCC) 01/23/2018     Priority: High   Chronic diastolic CHF (congestive heart failure) (HCC) 03/03/2017     Priority: High    Echo 11/2016, nl EF but LVH and diastolic dysfunction     Type 2 diabetes mellitus with peripheral neuropathy (HCC) 01/24/2017     Priority: High   Subclinical hyperthyroidism 01/21/2014     Priority: High    Managed by endocrine    Essential hypertension 02/23/2013     Priority: High    improved on recheck. Previously, lisinopril and HCTZ stopped 04/2022 during admission for AKI/ATN. HCTZ has been restarted -BP improved with change from atenolol to carvedilol -if additional BP control needed at follow up, could change diltiazem to amlodipine and monitor HR -stressed salt avoidance. She is working on activity    Combined hyperlipidemia associated with type 2 diabetes mellitus (HCC) 02/23/2013     Priority: High   Major depression, recurrent, chronic (HCC) 07/18/2012     Priority: High   Gout 11/01/2022     Priority: Medium    DJD of right shoulder 01/24/2017     Priority: Medium    Bereavement due to life event 03/05/2016     Priority: Medium     Her husband of 56 years died in 09-Feb-2016    Osteopenia 08/23/2015     Priority: Medium     DEXA 11/2020: lowest T =-2.3, left femoral neck, elevated FRAX score Major Osteoporotic Fracture: 29.4% Hip Fracture:                19.1% Recommend tx.started fosamax; stopped 04/2022  dexa 2017: T = -1.0 at femur, elevated FRAX score.   Repeat 2019 Osteopenia with elevated hip frac score of 10%:  T lowest = - 1.8 Major Osteoporotic Fracture: 17.1% Hip Fracture:                10.4% Population:                  [place] (Asian) Risk Factors:                Family Hist. (Parent hip fracture) Offer medications due to elevated hip fracture risk score.     Idiopathic scoliosis of thoracic spine 07/09/2014     Priority: Medium    Sjogren's syndrome (HCC) 08/04/2012     Priority: Medium    DDD (degenerative disc disease), lumbosacral 07/15/2012     Priority: Medium    Lumbosacral spondylosis 07/15/2012     Priority: Medium    Osteoarthritis of knee 07/15/2012     Priority: Medium    Bilateral edema of  lower extremity 01/26/2017     Priority: Low   Bilateral hip bursitis 07/17/2015     Priority: Low   Age-related nuclear cataract of both eyes 03/11/2015     Priority: Low   Insomnia 09/04/2013     Priority: Low   Allergic rhinitis 07/15/2012     Priority: Low   Health Maintenance  Topic Date Due   OPHTHALMOLOGY EXAM  02/26/2022   Diabetic kidney evaluation - Urine ACR  10/24/2022   HEMOGLOBIN A1C  10/13/2022   COVID-19 Vaccine (7 - 2023-24 season) 11/17/2022 (Originally 10/10/2022)   DEXA SCAN  11/14/2022   Medicare Annual Wellness (AWV)  06/22/2023   Diabetic kidney evaluation - eGFR measurement  06/28/2023   FOOT EXAM  11/01/2023   Pneumonia Vaccine 65+ Years old  Completed   INFLUENZA VACCINE  Completed   Zoster Vaccines- Shingrix  Completed   HPV VACCINES  Aged Out   DTaP/Tdap/Td  Discontinued   Immunization History  Administered Date(s) Administered   Fluad Quad(high Dose 65+) 11/20/2018, 11/28/2020, 10/23/2021   Fluad Trivalent(High Dose 65+) 11/01/2022   Influenza Split 01/24/2012   Influenza, High Dose Seasonal PF 12/02/2013, 01/20/2015, 12/28/2016, 10/26/2019   Influenza,inj,Quad PF,6+ Mos 11/21/2015, 11/18/2017   Influenza-Unspecified 01/24/2012, 01/05/2014, 01/05/2014, 01/20/2015   PFIZER  Comirnaty(Gray Top)Covid-19 Tri-Sucrose Vaccine 05/20/2020   PFIZER(Purple Top)SARS-COV-2 Vaccination 03/01/2019, 03/22/2019, 11/16/2019   Pfizer Covid-19 Vaccine Bivalent Booster 63yrs & up 12/16/2020   Pfizer(Comirnaty)Fall Seasonal Vaccine 12 years and older 11/29/2021   Pneumococcal Conjugate-13 03/06/2015, 03/06/2015   Pneumococcal Polysaccharide-23 06/18/2016   Zoster Recombinant(Shingrix) 03/09/2018, 08/21/2018   Zoster, Live 10/09/2012   We updated and reviewed the patient's past history in detail and it is documented below. Allergies: Patient is allergic to amoxicillin-pot clavulanate, amoxicillin, and clarithromycin. Past Medical History Patient  has a past medical history of Arthritis, Depression, Diabetes mellitus without complication (HCC), Diastolic CHF (HCC) (03/03/2017), GERD (gastroesophageal reflux disease), Hyperlipidemia, Hypertension, Peripheral arterial disease (HCC) (2018), and Thyroid disease. Past Surgical History Patient  has a past surgical history that includes Femoral endarterectomy (Right, 11/29/2016). Family History: Patient family history includes Arthritis in her mother and sister; Diabetes in her father and sister; Healthy in her daughter, son, and son; Heart attack in her father and mother; Hyperlipidemia in her father; Other in her son. Social History:  Patient  reports that she has never smoked. She has never used smokeless tobacco. She reports that she does not drink alcohol and does not use drugs.  Review of Systems: Constitutional: negative for fever or malaise Ophthalmic: negative for photophobia, double vision or loss of vision Cardiovascular: negative for chest pain, dyspnea on exertion, or new LE swelling Respiratory: negative for SOB or persistent cough Gastrointestinal: negative for abdominal pain, change in bowel habits or melena Genitourinary: negative for dysuria or gross hematuria, no abnormal uterine bleeding or disharge Musculoskeletal:  negative for new gait disturbance or muscular weakness Integumentary: negative for new or persistent rashes, no breast lumps Neurological: negative for TIA or stroke symptoms Psychiatric: negative for SI or delusions Allergic/Immunologic: negative for hives  Patient Care Team    Relationship Specialty Notifications Start End  Willow Ora, MD PCP - General Family Medicine  01/24/17   Jodelle Red, MD PCP - Cardiology Cardiology  09/24/21   Lovenia Shuck, MD Referring Physician Ophthalmology  01/24/17   Ocie Cornfield, MD Consulting Physician Endocrinology  01/24/17   Janene Madeira, MD Referring Physician Vascular Surgery  01/24/17   Ollen Gross, MD Consulting Physician  Orthopedic Surgery  07/08/17   Dermatology, [facility] Consulting Physician   11/20/18     Objective  Vitals: BP 136/72   Pulse 76   Temp 98.2 F (36.8 C)   Ht 5\' 1"  (1.549 m)   Wt 139 lb 9.6 oz (63.3 kg)   SpO2 97%   BMI 26.38 kg/m  General:  Well developed, well nourished, no acute distress, looks great Psych:  Alert and orientedx3,normal mood and affect HEENT:  Normocephalic, atraumatic, non-icteric sclera,  supple neck without adenopathy, mass or thyromegaly Cardiovascular:  Normal S1, S2, irregularly irregular Respiratory:  Good breath sounds bilaterally, CTAB with normal respiratory effort Gastrointestinal: normal bowel sounds, soft, non-tender, no noted masses. No HSM MSK: extremities without edema, joints without erythema or swelling Neurologic:    Mental status is normal.  Gross motor and sensory exams are normal.  No tremor Diabetic Foot Exam: Appearance - no lesions, ulcers or significant calluses Skin - no sigificant pallor or erythema Normal sensation Pulses - +2 distally bilaterally  Commons side effects, risks, benefits, and alternatives for medications and treatment plan prescribed today were discussed, and the patient expressed understanding of the given instructions.  Patient is instructed to call or message via MyChart if he/she has any questions or concerns regarding our treatment plan. No barriers to understanding were identified. We discussed Red Flag symptoms and signs in detail. Patient expressed understanding regarding what to do in case of urgent or emergency type symptoms.  Medication list was reconciled, printed and provided to the patient in AVS. Patient instructions and summary information was reviewed with the patient as documented in the AVS. This note was prepared with assistance of Dragon voice recognition software. Occasional wrong-word or sound-a-like substitutions may have occurred due to the inherent limitations of voice recognition software

## 2022-11-02 ENCOUNTER — Encounter: Payer: Self-pay | Admitting: Family Medicine

## 2022-11-02 DIAGNOSIS — N1832 Chronic kidney disease, stage 3b: Secondary | ICD-10-CM | POA: Insufficient documentation

## 2022-11-02 DIAGNOSIS — D696 Thrombocytopenia, unspecified: Secondary | ICD-10-CM | POA: Insufficient documentation

## 2022-11-02 NOTE — Addendum Note (Signed)
Addended by: Asencion Partridge on: 11/02/2022 12:48 PM   Modules accepted: Orders

## 2022-11-04 NOTE — Progress Notes (Signed)
Please call patient: Please ask patient if she would like to make another office visit so we can go over all of these results.  There are several things that we can discuss regarding adjusting her medications.  Her diabetes is a little bit worsening with an A1c of 7.3.  Urine shows that she is spilling a little bit of urine and we can discuss medications for this.  Or we can discuss these things at her 34-month follow-up.  In the meantime she can try to work on improving her diet, eating less sweets.  Thanks

## 2022-11-08 ENCOUNTER — Telehealth: Payer: Self-pay | Admitting: Family Medicine

## 2022-11-08 NOTE — Telephone Encounter (Signed)
Lvm requesting call back to schedule a f/u to discuss medication before f/u in December .

## 2022-11-11 ENCOUNTER — Other Ambulatory Visit: Payer: Self-pay

## 2022-11-11 ENCOUNTER — Telehealth: Payer: Self-pay | Admitting: Family Medicine

## 2022-11-11 DIAGNOSIS — Z780 Asymptomatic menopausal state: Secondary | ICD-10-CM

## 2022-11-11 DIAGNOSIS — Z13820 Encounter for screening for osteoporosis: Secondary | ICD-10-CM

## 2022-11-11 DIAGNOSIS — M8580 Other specified disorders of bone density and structure, unspecified site: Secondary | ICD-10-CM

## 2022-11-11 NOTE — Telephone Encounter (Signed)
Patient called asking if PCP could place an order for a DEXA scan @ [facility] [address] since they are able to get her in sooner.

## 2022-11-15 NOTE — Telephone Encounter (Signed)
Left pt a detailed message that Dexa scan has been forwarded to [facility] and that she may schedule at this time.

## 2022-11-22 ENCOUNTER — Ambulatory Visit: Payer: Medicare Other | Admitting: Family Medicine

## 2022-11-22 ENCOUNTER — Encounter: Payer: Self-pay | Admitting: Family Medicine

## 2022-11-22 VITALS — BP 132/80 | HR 85 | Temp 97.9°F | Ht 61.0 in | Wt 140.8 lb

## 2022-11-22 DIAGNOSIS — I5032 Chronic diastolic (congestive) heart failure: Secondary | ICD-10-CM | POA: Diagnosis not present

## 2022-11-22 DIAGNOSIS — I10 Essential (primary) hypertension: Secondary | ICD-10-CM | POA: Diagnosis not present

## 2022-11-22 DIAGNOSIS — N1832 Chronic kidney disease, stage 3b: Secondary | ICD-10-CM

## 2022-11-22 DIAGNOSIS — E1142 Type 2 diabetes mellitus with diabetic polyneuropathy: Secondary | ICD-10-CM

## 2022-11-22 MED ORDER — EMPAGLIFLOZIN 10 MG PO TABS: 10.0000 mg | ORAL_TABLET | Freq: Every day | ORAL | 5 refills | Status: DC

## 2022-11-22 NOTE — Progress Notes (Signed)
Subjective  CC:  Chief Complaint  Patient presents with   lab results    Eye exam is scheduled for Nov.    HPI: Nicole Jimenez is a 88 y.o. female who presents to the office today for follow up of diabetes and problems listed above in the chief complaint.  Diabetes follow up: Her diabetic control is reported as Unchanged. A1c is mildly. Diet is good overall but eating "juice pops" nightly and small amounts of rice 3-4x/week.  She denies exertional CP or SOB or symptomatic hypoglycemia. She denies foot sores or paresthesias.  Bp is controlled: c/o feeling woozy some mornings.  Reviewed + microalbuminuria: worse since being off of ace (AKI and hyperkalemia)  Wt Readings from Last 3 Encounters:  11/22/22 140 lb 12.8 oz (63.9 kg)  11/01/22 139 lb 9.6 oz (63.3 kg)  09/06/22 143 lb 6.4 oz (65 kg)    BP Readings from Last 3 Encounters:  11/22/22 132/80  11/01/22 136/72  09/06/22 134/72    Assessment  1. Type 2 diabetes mellitus with peripheral neuropathy (HCC)   2. Essential hypertension   3. Chronic diastolic CHF (congestive heart failure) (HCC)   4. Chronic kidney disease, stage 3b (HCC)      Plan  Diabetes is currently marginally controlled. Not bad given her age but will see if she can tolerate jardiance 10 daily. Education given. Recheck 3-4 weeks.  HTN: fair control sglt2i may also help. No ace yet. Ckd will monitor of jardiance.   Follow up: 3-4 weeks for recheck No orders of the defined types were placed in this encounter.  Meds ordered this encounter  Medications   empagliflozin (JARDIANCE) 10 MG TABS tablet    Sig: Take 1 tablet (10 mg total) by mouth daily.    Dispense:  30 tablet    Refill:  5      Immunization History  Administered Date(s) Administered   Fluad Quad(high Dose 65+) 11/20/2018, 11/28/2020, 10/23/2021   Fluad Trivalent(High Dose 65+) 11/01/2022   Influenza Split 01/24/2012   Influenza, High Dose Seasonal PF 12/02/2013, 01/20/2015,  12/28/2016, 10/26/2019   Influenza,inj,Quad PF,6+ Mos 11/21/2015, 11/18/2017   Influenza-Unspecified 01/24/2012, 01/05/2014, 01/05/2014, 01/20/2015   PFIZER Comirnaty(Gray Top)Covid-19 Tri-Sucrose Vaccine 05/20/2020   PFIZER(Purple Top)SARS-COV-2 Vaccination 03/01/2019, 03/22/2019, 11/16/2019   Pfizer Covid-19 Vaccine Bivalent Booster 37yrs & up 12/16/2020   Pfizer(Comirnaty)Fall Seasonal Vaccine 12 years and older 11/29/2021   Pneumococcal Conjugate-13 03/06/2015, 03/06/2015   Pneumococcal Polysaccharide-23 06/18/2016   Zoster Recombinant(Shingrix) 03/09/2018, 08/21/2018   Zoster, Live 10/09/2012    Diabetes Related Lab Review: Lab Results  Component Value Date   HGBA1C 7.3 (H) 11/01/2022   HGBA1C 6.5 (A) 04/12/2022   HGBA1C 6.4 (A) 01/26/2022    Lab Results  Component Value Date   MICROALBUR 24.7 (H) 11/01/2022   Lab Results  Component Value Date   CREATININE 1.61 (H) 11/01/2022   BUN 29 (H) 11/01/2022   NA 141 11/01/2022   K 3.7 11/01/2022   CL 98 11/01/2022   CO2 33 (H) 11/01/2022   Lab Results  Component Value Date   CHOL 136 11/01/2022   CHOL 176 10/23/2021   CHOL 177 04/17/2021   Lab Results  Component Value Date   HDL 65.70 11/01/2022   HDL 71.50 10/23/2021   HDL 73.10 04/17/2021   Lab Results  Component Value Date   LDLCALC 51 11/01/2022   LDLCALC 84 10/23/2021   LDLCALC 80 04/17/2021   Lab Results  Component Value Date  TRIG 97.0 11/01/2022   TRIG 106.0 10/23/2021   TRIG 122.0 04/17/2021   Lab Results  Component Value Date   CHOLHDL 2 11/01/2022   CHOLHDL 2 10/23/2021   CHOLHDL 2 04/17/2021   No results found for: "LDLDIRECT" The ASCVD Risk score (Arnett DK, et al., 2019) failed to calculate for the following reasons:   The 2019 ASCVD risk score is only valid for ages 64 to 9 I have reviewed the PMH, Fam and Soc history. Patient Active Problem List   Diagnosis Date Noted Date Diagnosed   Chronic kidney disease, stage 3b (HCC) 11/02/2022      Priority: High    Had mild ckd, likely HTN/DM related. Then Had ATN 04/2022 w/ highest creatinine 4+; never returned completely back to baseline; GFR aroudn 30 is new baseline 10/2022    History of arterial embolism 08/21/2018     Priority: High    Managed by vascular. 2018    Anticoagulant long-term use 03/06/2018     Priority: High   Permanent atrial fibrillation (HCC) 01/23/2018     Priority: High   Chronic diastolic CHF (congestive heart failure) (HCC) 03/03/2017     Priority: High    Echo 11/2016, nl EF but LVH and diastolic dysfunction     Type 2 diabetes mellitus with peripheral neuropathy (HCC) 01/24/2017     Priority: High    Had been on metformin; stopped in march 2024 during hospitalization for AKI on ACE.  H/o pancreatitis: avoiding glp-1 and cautious if sglt2-i    Subclinical hyperthyroidism 01/21/2014     Priority: High    Managed by endocrine    Essential hypertension 02/23/2013     Priority: High    improved on recheck. Previously, lisinopril and HCTZ stopped 04/2022 during admission for AKI/ATN. HCTZ has been restarted -BP improved with change from atenolol to carvedilol -if additional BP control needed at follow up, could change diltiazem to amlodipine and monitor HR -stressed salt avoidance. She is working on activity 10/2022: restarted low dose ace for renal protection and microalbuminuria.     Combined hyperlipidemia associated with type 2 diabetes mellitus (HCC) 02/23/2013     Priority: High   Major depression, recurrent, chronic (HCC) 07/18/2012     Priority: High   Thrombocytopenia (HCC) 11/02/2022     Priority: Medium     First noted March 2024 during hospitalization for AKI and pancreatitis.  Improved posthospitalization.  September 2024, platelets 121.  Unclear cause, needs workup if persist.    Gout 11/01/2022     Priority: Medium    DJD of right shoulder 01/24/2017     Priority: Medium    Bereavement due to life event 03/05/2016      Priority: Medium     Her husband of 56 years died in 02/19/16    Osteopenia 08/23/2015     Priority: Medium     DEXA 11/2020: lowest T =-2.3, left femoral neck, elevated FRAX score Major Osteoporotic Fracture: 29.4% Hip Fracture:                19.1% Recommended fosamax but pt never started.   dexa 2017: T = -1.0 at femur, elevated FRAX score.  Repeat 2019 Osteopenia with elevated hip frac score of 10%:  T lowest = - 1.8 Major Osteoporotic Fracture: 17.1% Hip Fracture:                10.4% Population:  [place] (Asian) Risk Factors:                Family Hist. (Parent hip fracture) Offer medications due to elevated hip fracture risk score.     Idiopathic scoliosis of thoracic spine 07/09/2014     Priority: Medium    Sjogren's syndrome (HCC) 08/04/2012     Priority: Medium    DDD (degenerative disc disease), lumbosacral 07/15/2012     Priority: Medium    Lumbosacral spondylosis 07/15/2012     Priority: Medium    Osteoarthritis of knee 07/15/2012     Priority: Medium    Bilateral edema of lower extremity 01/26/2017     Priority: Low   Bilateral hip bursitis 07/17/2015     Priority: Low   Age-related nuclear cataract of both eyes 03/11/2015     Priority: Low   Insomnia 09/04/2013     Priority: Low   Allergic rhinitis 07/15/2012     Priority: Low    Social History: Patient  reports that she has never smoked. She has never used smokeless tobacco. She reports that she does not drink alcohol and does not use drugs.  Review of Systems: Ophthalmic: negative for eye pain, loss of vision or double vision Cardiovascular: negative for chest pain Respiratory: negative for SOB or persistent cough Gastrointestinal: negative for abdominal pain Genitourinary: negative for dysuria or gross hematuria MSK: negative for foot lesions Neurologic: negative for weakness or gait disturbance  Objective  Vitals: BP 132/80   Pulse 85   Temp 97.9 F (36.6 C)   Ht 5\' 1"  (1.549  m)   Wt 140 lb 12.8 oz (63.9 kg)   SpO2 97%   BMI 26.60 kg/m  General: well appearing, no acute distress  Psych:  Alert and oriented, normal mood and affect   Diabetic education: ongoing education regarding chronic disease management for diabetes was given today. We continue to reinforce the ABC's of diabetic management: A1c (<7 or 8 dependent upon patient), tight blood pressure control, and cholesterol management with goal LDL < 100 minimally. We discuss diet strategies, exercise recommendations, medication options and possible side effects. At each visit, we review recommended immunizations and preventive care recommendations for diabetics and stress that good diabetic control can prevent other problems. See below for this patient's data.   Commons side effects, risks, benefits, and alternatives for medications and treatment plan prescribed today were discussed, and the patient expressed understanding of the given instructions. Patient is instructed to call or message via MyChart if he/she has any questions or concerns regarding our treatment plan. No barriers to understanding were identified. We discussed Red Flag symptoms and signs in detail. Patient expressed understanding regarding what to do in case of urgent or emergency type symptoms.  Medication list was reconciled, printed and provided to the patient in AVS. Patient instructions and summary information was reviewed with the patient as documented in the AVS. This note was prepared with assistance of Dragon voice recognition software. Occasional wrong-word or sound-a-like substitutions may have occurred due to the inherent limitations of voice recognition software

## 2022-11-25 ENCOUNTER — Ambulatory Visit (INDEPENDENT_AMBULATORY_CARE_PROVIDER_SITE_OTHER)
Admission: RE | Admit: 2022-11-25 | Discharge: 2022-11-25 | Disposition: A | Discharge status: Home / Self Care | Payer: Medicare Other | Source: Ambulatory Visit | Attending: Family Medicine | Admitting: Family Medicine

## 2022-11-25 DIAGNOSIS — M8580 Other specified disorders of bone density and structure, unspecified site: Secondary | ICD-10-CM

## 2022-11-25 DIAGNOSIS — Z780 Asymptomatic menopausal state: Secondary | ICD-10-CM

## 2022-12-02 DIAGNOSIS — Z23 Encounter for immunization: Secondary | ICD-10-CM | POA: Diagnosis not present

## 2022-12-17 ENCOUNTER — Ambulatory Visit (HOSPITAL_BASED_OUTPATIENT_CLINIC_OR_DEPARTMENT_OTHER): Payer: Medicare Other | Admitting: Cardiology

## 2022-12-17 ENCOUNTER — Encounter (HOSPITAL_BASED_OUTPATIENT_CLINIC_OR_DEPARTMENT_OTHER): Payer: Self-pay | Admitting: Cardiology

## 2022-12-17 VITALS — BP 118/54 | HR 65 | Ht 61.0 in | Wt 136.0 lb

## 2022-12-17 DIAGNOSIS — E782 Mixed hyperlipidemia: Secondary | ICD-10-CM

## 2022-12-17 DIAGNOSIS — E1169 Type 2 diabetes mellitus with other specified complication: Secondary | ICD-10-CM

## 2022-12-17 DIAGNOSIS — Z9889 Other specified postprocedural states: Secondary | ICD-10-CM

## 2022-12-17 DIAGNOSIS — D6869 Other thrombophilia: Secondary | ICD-10-CM

## 2022-12-17 DIAGNOSIS — I10 Essential (primary) hypertension: Secondary | ICD-10-CM

## 2022-12-17 DIAGNOSIS — Z7901 Long term (current) use of anticoagulants: Secondary | ICD-10-CM

## 2022-12-17 DIAGNOSIS — I4821 Permanent atrial fibrillation: Secondary | ICD-10-CM | POA: Diagnosis not present

## 2022-12-17 DIAGNOSIS — Z86718 Personal history of other venous thrombosis and embolism: Secondary | ICD-10-CM | POA: Diagnosis not present

## 2022-12-17 NOTE — Progress Notes (Signed)
Cardiology Office Note:  .    Date:  12/17/2022  ID:  Nicole Jimenez, DOB Jun 23, 1934, MRN [id number] PCP: Willow Ora, MD  [facility] Providers Cardiologist:  Jodelle Red, MD     History of Present Illness: .    Nicole Jimenez is a 88 y.o. 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 She was initially seen 06/11/2021 as a new consult at the request of Willow Ora, MD for the evaluation and management of permanent atrial fibrillation.   Cardiovascular risk factors: Prior clinical ASCVD:  DC Cardioversion 03/21/2018, she confirms that she was unable to tell if this procedure was successful. She never felt symptoms such as palpitations. She reports having a blood clot previously while she was in [place]. Comorbid conditions: Hypertension, Hyperlipidemia, Diabetes  Prior cardiac testing and/or incidental findings on other testing (ie coronary calcium): She notes that the majority of her providers were at [facility] previously. TTE 02/2018 with normal LV function and mild MR/TR.   Today: Doing well overall, has had a lot of stress. Swelling resolved. Now on Jardiance, tolerating this well. Lightheadedness intermittent, not severe, no falls. Reviewed that if this worsens, would discuss stopping hydrochlorothiazide.  Denies chest pain, shortness of breath at rest or with normal exertion. No PND, orthopnea, LE edema or unexpected weight gain. No syncope or palpitations. ROS otherwise negative except as noted.   ROS:  Please see the history of present illness. ROS otherwise negative except as noted.   Studies Reviewed: Marland Kitchen         Physical Exam:    VS:  BP (!) 118/54 (BP Location: Left Arm, Patient Position: Sitting, Cuff Size: Normal)   Pulse  65   Ht 5\' 1"  (1.549 m)   Wt 136 lb (61.7 kg)   BMI 25.70 kg/m    Wt Readings from Last 3 Encounters:  12/17/22 136 lb (61.7 kg)  11/22/22 140 lb 12.8 oz (63.9 kg)  11/01/22 139 lb 9.6 oz (63.3 kg)    GEN: Well nourished, well developed in no acute distress HEENT: Normal, moist mucous membranes NECK: No JVD CARDIAC: Irregularly irregular, normal S1 and S2, no rubs or gallops. No murmur. VASCULAR: Radial and DP pulses 2+ bilaterally. No carotid bruits RESPIRATORY:  Clear to auscultation without rales, wheezing or rhonchi  ABDOMEN: Soft, non-tender, non-distended MUSCULOSKELETAL:  Ambulates independently SKIN: Warm and dry, no edema NEUROLOGIC:  Alert and oriented x 3. No focal neuro deficits noted. PSYCHIATRIC:  Normal affect   ASSESSMENT AND PLAN: .    Permanent atrial fibrillation -CHA2DS2-VASc Score = 8 -Also with prior embolic occlusion of femoral artery, but this was prior to diagnosis of afib/anticoagulation -continue apixaban 5 mg BID -continue carvedilol, diltiazem   Hypertension -well controlled today -Previously, lisinopril and HCTZ stopped 04/2022 during admission for AKI/ATN. HCTZ was restarted. She is now on Jardiance. If BP drops further or she has lightheadedness, would drop HCTZ -BP improved with change from atenolol to carvedilol -stressed salt avoidance. She is working on activity   Type II diabetes Hyperlipidemia -continue rosuvastatin -no aspirin as she is on apixaban -no longer on metformin -now on Jardiance, tolerating   Cardiac risk counseling and prevention recommendations: -recommend heart healthy/Mediterranean diet, with whole grains, fruits, vegetable, fish, lean meats, nuts, and olive oil. Limit salt. -recommend moderate walking, 3-5 times/week  for 30-50 minutes each session. Aim for at least 150 minutes.week. Goal should be pace of 3 miles/hours, or walking 1.5 miles in 30 minutes -recommend avoidance of tobacco products. Avoid excess  alcohol.  Dispo: Follow-up in 3 months, or sooner as needed.  Signed, Jodelle Red, MD

## 2022-12-17 NOTE — Patient Instructions (Signed)
Medication Instructions:  Your physician recommends that you continue on your current medications as directed. Please refer to the Current Medication list given to you today.  *If you need a refill on your cardiac medications before your next appointment, please call your pharmacy*  Lab Work: NONE  Testing/Procedures: NONE  Follow-Up: At [facility], you and your health needs are our priority.  As part of our continuing mission to provide you with exceptional heart care, we have created designated Provider Care Teams.  These Care Teams include your primary Cardiologist (physician) and Advanced Practice Providers (APPs -  Physician Assistants and Nurse Practitioners) who all work together to provide you with the care you need, when you need it.  We recommend signing up for the patient portal called "MyChart".  Sign up information is provided on this After Visit Summary.  MyChart is used to connect with patients for Virtual Visits (Telemedicine).  Patients are able to view lab/test results, encounter notes, upcoming appointments, etc.  Non-urgent messages can be sent to your provider as well.   To learn more about what you can do with MyChart, go to [email or web address].    Your next appointment:   3 month(s)  The format for your next appointment:   In Person  Provider:   Jodelle Red, MD AND Ronn Melena NP

## 2022-12-24 ENCOUNTER — Encounter: Payer: Self-pay | Admitting: Family Medicine

## 2022-12-24 ENCOUNTER — Ambulatory Visit (INDEPENDENT_AMBULATORY_CARE_PROVIDER_SITE_OTHER): Payer: Medicare Other | Admitting: Family Medicine

## 2022-12-24 VITALS — BP 100/60 | HR 75 | Temp 98.2°F | Ht 61.0 in | Wt 138.4 lb

## 2022-12-24 DIAGNOSIS — F339 Major depressive disorder, recurrent, unspecified: Secondary | ICD-10-CM | POA: Diagnosis not present

## 2022-12-24 DIAGNOSIS — I10 Essential (primary) hypertension: Secondary | ICD-10-CM | POA: Diagnosis not present

## 2022-12-24 DIAGNOSIS — M8580 Other specified disorders of bone density and structure, unspecified site: Secondary | ICD-10-CM

## 2022-12-24 DIAGNOSIS — Z7984 Long term (current) use of oral hypoglycemic drugs: Secondary | ICD-10-CM | POA: Diagnosis not present

## 2022-12-24 DIAGNOSIS — Z9189 Other specified personal risk factors, not elsewhere classified: Secondary | ICD-10-CM

## 2022-12-24 DIAGNOSIS — I4821 Permanent atrial fibrillation: Secondary | ICD-10-CM | POA: Diagnosis not present

## 2022-12-24 DIAGNOSIS — E1142 Type 2 diabetes mellitus with diabetic polyneuropathy: Secondary | ICD-10-CM | POA: Diagnosis not present

## 2022-12-24 LAB — RENAL FUNCTION PANEL
Albumin: 4.1 g/dL (ref 3.5–5.2)
BUN: 31 mg/dL — ABNORMAL HIGH (ref 6–23)
CO2: 33 meq/L — ABNORMAL HIGH (ref 19–32)
Calcium: 10 mg/dL (ref 8.4–10.5)
Chloride: 101 meq/L (ref 96–112)
Creatinine, Ser: 1.84 mg/dL — ABNORMAL HIGH (ref 0.40–1.20)
GFR: 24.62 mL/min — ABNORMAL LOW (ref >60.00)
Glucose, Bld: 199 mg/dL — ABNORMAL HIGH (ref 70–99)
Phosphorus: 3.6 mg/dL (ref 2.3–4.6)
Potassium: 4.2 meq/L (ref 3.5–5.1)
Sodium: 144 meq/L (ref 135–145)

## 2022-12-24 MED ORDER — APIXABAN 2.5 MG PO TABS: 2.5000 mg | ORAL_TABLET | Freq: Two times a day (BID) | ORAL | 3 refills | Status: DC

## 2022-12-24 MED ORDER — VORTIOXETINE HBR 10 MG PO TABS: 10.0000 mg | ORAL_TABLET | Freq: Every day | ORAL | 3 refills | Status: DC

## 2022-12-24 NOTE — Patient Instructions (Signed)
Please return in 4 weeks to recheck diabetes.  If you have any questions or concerns, please don't hesitate to send me a message via MyChart or call the office at [phone]. Thank you for visiting with [place] today! It's our pleasure caring for you.   VISIT SUMMARY:  During today's visit, we discussed your recent start on Jardiance for diabetes management and your experiences since the medication change. You reported feeling generally the same but mentioned intermittent lightheadedness and an irregular eating schedule. We also reviewed your diet and the importance of regular blood sugar monitoring.  YOUR PLAN:  -TYPE 2 DIABETES MELLITUS: Type 2 Diabetes Mellitus is a condition where your body does not use insulin properly, leading to high blood sugar levels. We discussed the importance of monitoring your blood sugar levels in the morning with a target of less than 120 mg/dL. We will also order blood work to check your kidney function and follow up in three months to assess your blood sugar levels.  -HYPERTENSION: Hypertension is high blood pressure, which can lead to other health issues if not managed properly. Your blood pressure appears to be lower, which might require adjusting your medication to prevent low blood pressure and lightheadedness. please stop the HCTZ.  -GENERAL HEALTH MAINTENANCE: We discussed the importance of dietary modifications to reduce your sugar intake, especially given your current diet that includes high-sugar foods like blueberry pie with ice cream. Making these changes can help manage your diabetes and overall health.  INSTRUCTIONS:  Please monitor your blood sugar levels every morning with a target of less than 120 mg/dL. We will order blood work to check your kidney function. Follow up in three months to assess your blood sugar levels. Additionally, we will review and adjust your blood pressure medication as needed.

## 2022-12-24 NOTE — Progress Notes (Signed)
Subjective  CC:  Chief Complaint  Patient presents with   Diabetes    HPI: Nicole Jimenez is a 88 y.o. femaleemale who presents to the office today for follow up of diabetes and problems listed above in the chief complaint.  Discussed the use of AI scribe software for clinical note transcription with the patient, who gave verbal consent to proceed.  History of Present Illness   The patient, who was recently started on Jardiance for diabetes management, reports feeling generally the same since the medication change. They have not been monitoring their blood glucose levels at home. They report intermittent episodes of lightheadedness, both upon waking and sporadically throughout the day, but cannot recall if these episodes started before or after the medication change.  The patient admits to an irregular eating schedule, eating when hungry rather than at set meal times. They report an improvement in their diet, eating more than they had been previously, but still consume rice, bread, and occasional sweets such as blueberry pie with ice cream.  Despite the dietary indiscretions, the patient has not noticed any significant changes in their overall health or well-being. They express a willingness to monitor their blood glucose levels at home in the mornings, but admit that their levels have never been below 120.   had dexa scan done in october. has h/o osteopenia and elevated FRAX score but decided to defer treatment in 2022. reviewed results:     DEXA 11/2022: lowest T -=-2.3 FRAX score: 10 year major osteoporotic risk: 16%. 10 year hip fracture risk: 4.9%. The thresholds for treatment are 20% and 3%, respectively. She will consider fosamax but defer decision until 2025.  Wt Readings from Last 3 Encounters:  12/24/22 138 lb 6.1 oz (62.8 kg)  12/17/22 136 lb (61.7 kg)  11/22/22 140 lb 12.8 oz (63.9 kg)    BP Readings from Last 3 Encounters:  12/24/22 100/60  12/17/22 (!) 118/54  11/22/22  132/80    Assessment  1. Type 2 diabetes mellitus with peripheral neuropathy (HCC)   2. Major depression, recurrent, chronic (HCC)   3. Permanent atrial fibrillation (HCC)   4. Essential hypertension   5. Fracture Risk Assessment Score (FRAX) indicating greater than 3% risk for hip fracture   6. Osteopenia, unspecified location      Plan  Assessment and Plan    Type 2 Diabetes Mellitus Started on Jardiance with no significant side effects. Blood sugar levels not regularly monitored. Occasional lightheadedness, possibly related to blood pressure changes. Diet includes high-sugar foods like blueberry pie with ice cream. Emphasized the importance of regular blood sugar monitoring and dietary modifications. - Monitor blood sugar levels in the morning with a target of <120 mg/dL - Order blood work to check kidney function - Follow up in 6 weeks to assess blood sugar levels  Hypertension Blood pressure too low now. Stop hydrochlorothiazide. Continue diltiazem.   Elevated FRAX score and osteopenia -pt defers decision to treat or not until after holidays. Will address again next year. Continue ca and vit D  Depression is controlled. Refilled trintellix.  General Health Maintenance Diet includes high-sugar foods. Advised further dietary modifications. - Advise on dietary modifications to reduce sugar intake.     Orders Placed This Encounter  Procedures   Renal function panel   Meds ordered this encounter  Medications   apixaban (ELIQUIS) 2.5 MG TABS tablet    Sig: Take 1 tablet (2.5 mg total) by mouth 2 (two) times daily.  Dispense:  180 tablet    Refill:  3   vortioxetine HBr (TRINTELLIX) 10 MG TABS tablet    Sig: Take 1 tablet (10 mg total) by mouth daily.    Dispense:  90 tablet    Refill:  3      Immunization History  Administered Date(s) Administered   Fluad Quad(high Dose 65+) 11/20/2018, 11/28/2020, 10/23/2021   Fluad Trivalent(High Dose 65+) 11/01/2022    Influenza Split 01/24/2012   Influenza, High Dose Seasonal PF 12/02/2013, 01/20/2015, 12/28/2016, 10/26/2019   Influenza,inj,Quad PF,6+ Mos 11/21/2015, 11/18/2017   Influenza-Unspecified 01/24/2012, 01/05/2014, 01/05/2014, 01/20/2015   PFIZER Comirnaty(Gray Top)Covid-19 Tri-Sucrose Vaccine 05/20/2020   PFIZER(Purple Top)SARS-COV-2 Vaccination 03/01/2019, 03/22/2019, 11/16/2019   Pfizer Covid-19 Vaccine Bivalent Booster 63yrs & up 12/16/2020   Pfizer(Comirnaty)Fall Seasonal Vaccine 12 years and older 11/29/2021, 12/02/2022   Pneumococcal Conjugate-13 03/06/2015, 03/06/2015   Pneumococcal Polysaccharide-23 06/18/2016   Zoster Recombinant(Shingrix) 03/09/2018, 08/21/2018   Zoster, Live 10/09/2012    Diabetes Related Lab Review: Lab Results  Component Value Date   HGBA1C 7.3 (H) 11/01/2022   HGBA1C 6.5 (A) 04/12/2022   HGBA1C 6.4 (A) 01/26/2022    Lab Results  Component Value Date   MICROALBUR 24.7 (H) 11/01/2022   Lab Results  Component Value Date   CREATININE 1.61 (H) 11/01/2022   BUN 29 (H) 11/01/2022   NA 141 11/01/2022   K 3.7 11/01/2022   CL 98 11/01/2022   CO2 33 (H) 11/01/2022   Lab Results  Component Value Date   CHOL 136 11/01/2022   CHOL 176 10/23/2021   CHOL 177 04/17/2021   Lab Results  Component Value Date   HDL 65.70 11/01/2022   HDL 71.50 10/23/2021   HDL 73.10 04/17/2021   Lab Results  Component Value Date   LDLCALC 51 11/01/2022   LDLCALC 84 10/23/2021   LDLCALC 80 04/17/2021   Lab Results  Component Value Date   TRIG 97.0 11/01/2022   TRIG 106.0 10/23/2021   TRIG 122.0 04/17/2021   Lab Results  Component Value Date   CHOLHDL 2 11/01/2022   CHOLHDL 2 10/23/2021   CHOLHDL 2 04/17/2021   No results found for: "LDLDIRECT" The ASCVD Risk score (Arnett DK, et al., 2019) failed to calculate for the following reasons:   The 2019 ASCVD risk score is only valid for ages 43 to 85 I have reviewed the PMH, Fam and Soc history. Patient Active  Problem List   Diagnosis Date Noted Date Diagnosed   Chronic kidney disease, stage 3b (HCC) 11/02/2022     Priority: High    Had mild ckd, likely HTN/DM related. Then Had ATN 04/2022 w/ highest creatinine 4+; never returned completely back to baseline; GFR aroudn 30 is new baseline 10/2022    History of arterial embolism 08/21/2018     Priority: High    Managed by vascular. 2018    Anticoagulant long-term use 03/06/2018     Priority: High   Permanent atrial fibrillation (HCC) 01/23/2018     Priority: High   Chronic diastolic CHF (congestive heart failure) (HCC) 03/03/2017     Priority: High    Echo 11/2016, nl EF but LVH and diastolic dysfunction     Type 2 diabetes mellitus with peripheral neuropathy (HCC) 01/24/2017     Priority: High    Had been on metformin; stopped in march 2024 during hospitalization for AKI on ACE.  H/o pancreatitis: avoiding glp-1 and cautious if sglt2-I started jardiance 10/24    Subclinical hyperthyroidism 01/21/2014  Priority: High    Managed by endocrine    Essential hypertension 02/23/2013     Priority: High    improved on recheck. Previously, lisinopril and HCTZ stopped 04/2022 during admission for AKI/ATN. HCTZ has been restarted -BP improved with change from atenolol to carvedilol -if additional BP control needed at follow up, could change diltiazem to amlodipine and monitor HR -stressed salt avoidance. She is working on activity     Combined hyperlipidemia associated with type 2 diabetes mellitus (HCC) 02/23/2013     Priority: High   Major depression, recurrent, chronic (HCC) 07/18/2012     Priority: High   Thrombocytopenia (HCC) 11/02/2022     Priority: Medium     First noted March 2024 during hospitalization for AKI and pancreatitis.  Improved posthospitalization.  September 2024, platelets 121.  Unclear cause, needs workup if persist.    Gout 11/01/2022     Priority: Medium    DJD of right shoulder 01/24/2017     Priority: Medium     Bereavement due to life event 03/05/2016     Priority: Medium     Her husband of 56 years died in Feb 13, 2016    Osteopenia 08/23/2015     Priority: Medium     DEXA 11/2022: lowest T -=-2.3 FRAX score: 10 year major osteoporotic risk: 16%. 10 year hip fracture risk: 4.9%. The thresholds for treatment are 20% and 3%, respectively. She will consider fosamax but defer decision until 2025.  DEXA 11/2020: lowest T =-2.3, left femoral neck, elevated FRAX score Major Osteoporotic Fracture: 29.4% Hip Fracture:                19.1% Recommended fosamax but pt never started.   dexa 2017: T = -1.0 at femur, elevated FRAX score.  Repeat 2019 Osteopenia with elevated hip frac score of 10%:  T lowest = - 1.8 Major Osteoporotic Fracture: 17.1% Hip Fracture:                10.4% Population:                  [place] ([place]) Risk Factors:                Family Hist. (Parent hip fracture) Offer medications due to elevated hip fracture risk score.     Idiopathic scoliosis of thoracic spine 07/09/2014     Priority: Medium    Sjogren's syndrome (HCC) 08/04/2012     Priority: Medium    DDD (degenerative disc disease), lumbosacral 07/15/2012     Priority: Medium    Lumbosacral spondylosis 07/15/2012     Priority: Medium    Osteoarthritis of knee 07/15/2012     Priority: Medium    Bilateral edema of lower extremity 01/26/2017     Priority: Low   Bilateral hip bursitis 07/17/2015     Priority: Low   Age-related nuclear cataract of both eyes 03/11/2015     Priority: Low   Insomnia 09/04/2013     Priority: Low   Allergic rhinitis 07/15/2012     Priority: Low    Social History: Patient  reports that she has never smoked. She has never used smokeless tobacco. She reports that she does not drink alcohol and does not use drugs.  Review of Systems: Ophthalmic: negative for eye pain, loss of vision or double vision Cardiovascular: negative for chest pain Respiratory: negative for SOB or persistent  cough Gastrointestinal: negative for abdominal pain Genitourinary: negative for dysuria or gross hematuria MSK:  negative for foot lesions Neurologic: negative for weakness or gait disturbance  Objective  Vitals: BP 100/60 (BP Location: Left Arm, Patient Position: Sitting, Cuff Size: Normal)   Pulse 75   Temp 98.2 F (36.8 C) (Temporal)   Ht 5\' 1"  (1.549 m)   Wt 138 lb 6.1 oz (62.8 kg)   SpO2 97%   BMI 26.15 kg/m  General: well appearing, no acute distress  Psych:  Alert and oriented, normal mood and affect  Diabetic education: ongoing education regarding chronic disease management for diabetes was given today. We continue to reinforce the ABC's of diabetic management: A1c (<7 or 8 dependent upon patient), tight blood pressure control, and cholesterol management with goal LDL < 100 minimally. We discuss diet strategies, exercise recommendations, medication options and possible side effects. At each visit, we review recommended immunizations and preventive care recommendations for diabetics and stress that good diabetic control can prevent other problems. See below for this patient's data.   Commons side effects, risks, benefits, and alternatives for medications and treatment plan prescribed today were discussed, and the patient expressed understanding of the given instructions. Patient is instructed to call or message via MyChart if he/she has any questions or concerns regarding our treatment plan. No barriers to understanding were identified. We discussed Red Flag symptoms and signs in detail. Patient expressed understanding regarding what to do in case of urgent or emergency type symptoms.  Medication list was reconciled, printed and provided to the patient in AVS. Patient instructions and summary information was reviewed with the patient as documented in the AVS. This note was prepared with assistance of Dragon voice recognition software. Occasional wrong-word or sound-a-like substitutions  may have occurred due to the inherent limitations of voice recognition software

## 2022-12-25 ENCOUNTER — Encounter: Payer: Self-pay | Admitting: Family Medicine

## 2022-12-27 ENCOUNTER — Other Ambulatory Visit: Payer: Self-pay

## 2022-12-27 MED ORDER — ONETOUCH ULTRA VI STRP: ORAL_STRIP | 5 refills | Status: AC

## 2022-12-28 ENCOUNTER — Other Ambulatory Visit: Payer: Self-pay

## 2022-12-28 DIAGNOSIS — H04123 Dry eye syndrome of bilateral lacrimal glands: Secondary | ICD-10-CM | POA: Diagnosis not present

## 2022-12-28 DIAGNOSIS — E119 Type 2 diabetes mellitus without complications: Secondary | ICD-10-CM | POA: Diagnosis not present

## 2022-12-28 DIAGNOSIS — H524 Presbyopia: Secondary | ICD-10-CM | POA: Diagnosis not present

## 2022-12-28 DIAGNOSIS — H43813 Vitreous degeneration, bilateral: Secondary | ICD-10-CM | POA: Diagnosis not present

## 2022-12-28 DIAGNOSIS — H52203 Unspecified astigmatism, bilateral: Secondary | ICD-10-CM | POA: Diagnosis not present

## 2022-12-28 DIAGNOSIS — Z961 Presence of intraocular lens: Secondary | ICD-10-CM | POA: Diagnosis not present

## 2022-12-28 DIAGNOSIS — H26492 Other secondary cataract, left eye: Secondary | ICD-10-CM | POA: Diagnosis not present

## 2022-12-28 DIAGNOSIS — H5202 Hypermetropia, left eye: Secondary | ICD-10-CM | POA: Diagnosis not present

## 2022-12-28 DIAGNOSIS — H35371 Puckering of macula, right eye: Secondary | ICD-10-CM | POA: Diagnosis not present

## 2022-12-28 DIAGNOSIS — Z7984 Long term (current) use of oral hypoglycemic drugs: Secondary | ICD-10-CM | POA: Diagnosis not present

## 2022-12-28 LAB — HM DIABETES EYE EXAM

## 2022-12-28 MED ORDER — VORTIOXETINE HBR 10 MG PO TABS: 10.0000 mg | ORAL_TABLET | Freq: Every day | ORAL | 3 refills | Status: DC

## 2022-12-28 MED ORDER — APIXABAN 2.5 MG PO TABS: 2.5000 mg | ORAL_TABLET | Freq: Two times a day (BID) | ORAL | 3 refills | Status: DC

## 2022-12-30 NOTE — Progress Notes (Signed)
See mychart note Dear Ms. Iannacone, Your blood work shows your kidney function continues to change.  I believe stopping the hydrochlorothiazide will help.  I will repeat this test at your follow up appointment to see if is improving again.  Keep hydrated.  And happy happy holidays.  Sincerely, Dr. Mardelle Matte

## 2023-01-01 ENCOUNTER — Encounter: Payer: Self-pay | Admitting: Family Medicine

## 2023-01-21 ENCOUNTER — Encounter: Payer: Self-pay | Admitting: Family Medicine

## 2023-01-21 ENCOUNTER — Ambulatory Visit (INDEPENDENT_AMBULATORY_CARE_PROVIDER_SITE_OTHER): Payer: Medicare Other | Admitting: Family Medicine

## 2023-01-21 VITALS — BP 136/62 | HR 71 | Temp 97.9°F | Ht 61.0 in | Wt 140.6 lb

## 2023-01-21 DIAGNOSIS — E119 Type 2 diabetes mellitus without complications: Secondary | ICD-10-CM

## 2023-01-21 DIAGNOSIS — E1142 Type 2 diabetes mellitus with diabetic polyneuropathy: Secondary | ICD-10-CM | POA: Diagnosis not present

## 2023-01-21 DIAGNOSIS — Z7984 Long term (current) use of oral hypoglycemic drugs: Secondary | ICD-10-CM

## 2023-01-21 DIAGNOSIS — N1832 Chronic kidney disease, stage 3b: Secondary | ICD-10-CM | POA: Diagnosis not present

## 2023-01-21 DIAGNOSIS — I10 Essential (primary) hypertension: Secondary | ICD-10-CM

## 2023-01-21 LAB — POCT GLYCOSYLATED HEMOGLOBIN (HGB A1C): Hemoglobin A1C: 6.4 % — AB (ref 4.0–5.6)

## 2023-01-21 NOTE — Progress Notes (Signed)
Subjective  CC:  Chief Complaint  Patient presents with   Diabetes    HPI: Nicole Jimenez is a 88 y.o. female who presents to the office today for follow up of diabetes and problems listed above in the chief complaint.  Diabetes follow up: Her diabetic control is reported as Improved. Eating better on jardiance and tolerating (10mg ).  She denies exertional CP or SOB or symptomatic hypoglycemia. She denies foot sores or paresthesias.  CKD: no change in swelling; has right ankle edema responsive to elevating foot.  HTN: we stopped hydrochlorothiazide due to low bp. Now doing well.   Wt Readings from Last 3 Encounters:  01/21/23 140 lb 9.6 oz (63.8 kg)  12/24/22 138 lb 6.1 oz (62.8 kg)  12/17/22 136 lb (61.7 kg)    BP Readings from Last 3 Encounters:  01/21/23 136/62  12/24/22 100/60  12/17/22 (!) 118/54    Assessment  1. Type 2 diabetes mellitus with peripheral neuropathy (HCC)   2. Chronic kidney disease, stage 3b (HCC)   3. Diabetes mellitus treated with oral medication (HCC)   4. Essential hypertension      Plan  Diabetes is currently very well controlled. Continue jardiance 10 and diabetic diet CKD: will recheck again in 3 months. Off diuretic; on sglt2-I. Hoping for mild improvement. Avoid nephrotoxins HTN: now well controlled on cardizem cd 160mg  daily and carvedilol 12.5 bid.   Follow up: 33mo for diabetes, HTN and kidney disease recheck Orders Placed This Encounter  Procedures   POCT HgB A1C   No orders of the defined types were placed in this encounter.     Immunization History  Administered Date(s) Administered   Fluad Quad(high Dose 65+) 11/20/2018, 11/28/2020, 10/23/2021   Fluad Trivalent(High Dose 65+) 11/01/2022   Influenza Split 01/24/2012   Influenza, High Dose Seasonal PF 12/02/2013, 01/20/2015, 12/28/2016, 10/26/2019   Influenza,inj,Quad PF,6+ Mos 11/21/2015, 11/18/2017   Influenza-Unspecified 01/24/2012, 01/05/2014, 01/05/2014, 01/20/2015    PFIZER Comirnaty(Gray Top)Covid-19 Tri-Sucrose Vaccine 05/20/2020   PFIZER(Purple Top)SARS-COV-2 Vaccination 03/01/2019, 03/22/2019, 11/16/2019   Pfizer Covid-19 Vaccine Bivalent Booster 66yrs & up 12/16/2020   Pfizer(Comirnaty)Fall Seasonal Vaccine 12 years and older 11/29/2021   Pneumococcal Conjugate-13 03/06/2015, 03/06/2015   Pneumococcal Polysaccharide-23 06/18/2016   Respiratory Syncytial Virus Vaccine,Recomb Aduvanted(Arexvy) 01/17/2023   Zoster Recombinant(Shingrix) 03/09/2018, 08/21/2018   Zoster, Live 10/09/2012    Diabetes Related Lab Review: Lab Results  Component Value Date   HGBA1C 6.4 (A) 01/21/2023   HGBA1C 7.3 (H) 11/01/2022   HGBA1C 6.5 (A) 04/12/2022    Lab Results  Component Value Date   MICROALBUR 24.7 (H) 11/01/2022   Lab Results  Component Value Date   CREATININE 1.84 (H) 12/24/2022   BUN 31 (H) 12/24/2022   NA 144 12/24/2022   K 4.2 12/24/2022   CL 101 12/24/2022   CO2 33 (H) 12/24/2022   Lab Results  Component Value Date   CHOL 136 11/01/2022   CHOL 176 10/23/2021   CHOL 177 04/17/2021   Lab Results  Component Value Date   HDL 65.70 11/01/2022   HDL 71.50 10/23/2021   HDL 73.10 04/17/2021   Lab Results  Component Value Date   LDLCALC 51 11/01/2022   LDLCALC 84 10/23/2021   LDLCALC 80 04/17/2021   Lab Results  Component Value Date   TRIG 97.0 11/01/2022   TRIG 106.0 10/23/2021   TRIG 122.0 04/17/2021   Lab Results  Component Value Date   CHOLHDL 2 11/01/2022   CHOLHDL 2 10/23/2021  CHOLHDL 2 04/17/2021   No results found for: "LDLDIRECT" The ASCVD Risk score (Arnett DK, et al., Feb 28, 2018) failed to calculate for the following reasons:   The 02/28/2018 ASCVD risk score is only valid for ages 68 to 36 I have reviewed the PMH, Fam and Soc history. Patient Active Problem List   Diagnosis Date Noted Date Diagnosed   Chronic kidney disease, stage 3b (HCC) 11/02/2022     Priority: High    Had mild ckd, likely HTN/DM related. Then Had ATN  04/2022 w/ highest creatinine 4+; never returned completely back to baseline; GFR aroudn 30 is new baseline 10/2022    History of arterial embolism 08/21/2018     Priority: High    Managed by vascular. 28-Feb-2017    Anticoagulant long-term use 03/06/2018     Priority: High   Permanent atrial fibrillation (HCC) 01/23/2018     Priority: High   Chronic diastolic CHF (congestive heart failure) (HCC) 03/03/2017     Priority: High    Echo 11/2016, nl EF but LVH and diastolic dysfunction     Type 2 diabetes mellitus with peripheral neuropathy (HCC) 01/24/2017     Priority: High    Had been on metformin; stopped in march 2024 during hospitalization for AKI on ACE.  H/o pancreatitis: avoiding glp-1 and cautious if sglt2-I started jardiance 10/24    Subclinical hyperthyroidism 01/21/2014     Priority: High    Managed by endocrine    Essential hypertension 02/23/2013     Priority: High    improved on recheck. Previously, lisinopril and HCTZ stopped 04/2022 during admission for AKI/ATN. HCTZ has been restarted -BP improved with change from atenolol to carvedilol -if additional BP control needed at follow up, could change diltiazem to amlodipine and monitor HR -stressed salt avoidance. She is working on activity     Combined hyperlipidemia associated with type 2 diabetes mellitus (HCC) 02/23/2013     Priority: High   Major depression, recurrent, chronic (HCC) 07/18/2012     Priority: High   Thrombocytopenia (HCC) 11/02/2022     Priority: Medium     First noted March 2024 during hospitalization for AKI and pancreatitis.  Improved posthospitalization.  September 2024, platelets 121.  Unclear cause, needs workup if persist.    Gout 11/01/2022     Priority: Medium    DJD of right shoulder 01/24/2017     Priority: Medium    Bereavement due to life event 03/05/2016     Priority: Medium     Her husband of 56 years died in 29-Feb-2016    Osteoporosis of femur without pathological fracture  08/23/2015     Priority: Medium     DEXA 11/2022: lowest T -=-2.3 FRAX score: 10 year major osteoporotic risk: 16%. 10 year hip fracture risk: 4.9%. The thresholds for treatment are 20% and 3%, respectively. She will consider fosamax but defer decision until 02-29-2024.  DEXA 11/2020: lowest T =-2.3, left femoral neck, elevated FRAX score Major Osteoporotic Fracture: 29.4% Hip Fracture:                19.1% Recommended fosamax but pt never started.   dexa 2016-02-29: T = -1.0 at femur, elevated FRAX score.  Repeat 02/28/18 Osteopenia with elevated hip frac score of 10%:  T lowest = - 1.8 Major Osteoporotic Fracture: 17.1% Hip Fracture:                10.4% Population:  [place] ([place]) Risk Factors:                Family Hist. (Parent hip fracture) Offer medications due to elevated hip fracture risk score.     Idiopathic scoliosis of thoracic spine 07/09/2014     Priority: Medium    Sjogren's syndrome (HCC) 08/04/2012     Priority: Medium    DDD (degenerative disc disease), lumbosacral 07/15/2012     Priority: Medium    Lumbosacral spondylosis 07/15/2012     Priority: Medium    Osteoarthritis of knee 07/15/2012     Priority: Medium    Bilateral edema of lower extremity 01/26/2017     Priority: Low   Bilateral hip bursitis 07/17/2015     Priority: Low   Age-related nuclear cataract of both eyes 03/11/2015     Priority: Low   Insomnia 09/04/2013     Priority: Low   Allergic rhinitis 07/15/2012     Priority: Low    Social History: Patient  reports that she has never smoked. She has never used smokeless tobacco. She reports that she does not drink alcohol and does not use drugs.  Review of Systems: Ophthalmic: negative for eye pain, loss of vision or double vision Cardiovascular: negative for chest pain Respiratory: negative for SOB or persistent cough Gastrointestinal: negative for abdominal pain Genitourinary: negative for dysuria or gross hematuria MSK: negative for foot  lesions Neurologic: negative for weakness or gait disturbance  Objective  Vitals: BP 136/62   Pulse 71   Temp 97.9 F (36.6 C)   Ht 5\' 1"  (1.549 m)   Wt 140 lb 9.6 oz (63.8 kg)   SpO2 97%   BMI 26.57 kg/m  General: well appearing, no acute distress  Psych:  Alert and oriented, normal mood and affect HEENT:  Normocephalic, atraumatic, moist mucous membranes, supple neck  Cardiovascular:  Nl S1 and S2, RRR without murmur, gallop or rub. no edema Respiratory:  Good breath sounds bilaterally, CTAB with normal effort, no rales   Diabetic education: ongoing education regarding chronic disease management for diabetes was given today. We continue to reinforce the ABC's of diabetic management: A1c (<7 or 8 dependent upon patient), tight blood pressure control, and cholesterol management with goal LDL < 100 minimally. We discuss diet strategies, exercise recommendations, medication options and possible side effects. At each visit, we review recommended immunizations and preventive care recommendations for diabetics and stress that good diabetic control can prevent other problems. See below for this patient's data.   Commons side effects, risks, benefits, and alternatives for medications and treatment plan prescribed today were discussed, and the patient expressed understanding of the given instructions. Patient is instructed to call or message via MyChart if he/she has any questions or concerns regarding our treatment plan. No barriers to understanding were identified. We discussed Red Flag symptoms and signs in detail. Patient expressed understanding regarding what to do in case of urgent or emergency type symptoms.  Medication list was reconciled, printed and provided to the patient in AVS. Patient instructions and summary information was reviewed with the patient as documented in the AVS. This note was prepared with assistance of Dragon voice recognition software. Occasional wrong-word or  sound-a-like substitutions may have occurred due to the inherent limitations of voice recognition software

## 2023-01-28 ENCOUNTER — Other Ambulatory Visit (HOSPITAL_COMMUNITY): Payer: Self-pay

## 2023-01-28 ENCOUNTER — Telehealth: Payer: Self-pay

## 2023-01-28 NOTE — Telephone Encounter (Signed)
Can someone check to see if Trintellix  10mg  tabs needs a PA.  Thank you,  Christy Gentles

## 2023-01-31 ENCOUNTER — Ambulatory Visit: Payer: Medicare Other | Admitting: Family Medicine

## 2023-02-11 ENCOUNTER — Other Ambulatory Visit: Payer: Self-pay | Admitting: Family Medicine

## 2023-03-01 ENCOUNTER — Encounter (HOSPITAL_BASED_OUTPATIENT_CLINIC_OR_DEPARTMENT_OTHER): Payer: Self-pay | Admitting: Family

## 2023-03-01 ENCOUNTER — Ambulatory Visit (HOSPITAL_BASED_OUTPATIENT_CLINIC_OR_DEPARTMENT_OTHER): Payer: Medicare Other | Admitting: Family

## 2023-03-01 VITALS — BP 128/64 | HR 77 | Ht 61.0 in | Wt 142.9 lb

## 2023-03-01 DIAGNOSIS — D6859 Other primary thrombophilia: Secondary | ICD-10-CM | POA: Diagnosis not present

## 2023-03-01 DIAGNOSIS — I4821 Permanent atrial fibrillation: Secondary | ICD-10-CM | POA: Diagnosis not present

## 2023-03-01 DIAGNOSIS — E782 Mixed hyperlipidemia: Secondary | ICD-10-CM

## 2023-03-01 DIAGNOSIS — I10 Essential (primary) hypertension: Secondary | ICD-10-CM | POA: Diagnosis not present

## 2023-03-01 MED ORDER — CARVEDILOL 12.5 MG PO TABS: 12.5000 mg | ORAL_TABLET | Freq: Two times a day (BID) | ORAL | 3 refills | Status: DC

## 2023-03-01 NOTE — Patient Instructions (Addendum)
Medication Instructions:  Continue your current medications.   *If you need a refill on your cardiac medications before your next appointment, please call your pharmacy*  Follow-Up: At [facility], you and your health needs are our priority.  As part of our continuing mission to provide you with exceptional heart care, we have created designated Provider Care Teams.  These Care Teams include your primary Cardiologist (physician) and Advanced Practice Providers (APPs -  Physician Assistants and Nurse Practitioners) who all work together to provide you with the care you need, when you need it.  We recommend signing up for the patient portal called "MyChart".  Sign up information is provided on this After Visit Summary.  MyChart is used to connect with patients for Virtual Visits (Telemedicine).  Patients are able to view lab/test results, encounter notes, upcoming appointments, etc.  Non-urgent messages can be sent to your provider as well.   To learn more about what you can do with MyChart, go to [email or web address].    Your next appointment:   6 month(s)  Provider:   Jodelle Red, MD or Gillian Shields, NP    Other Instructions  To prevent or reduce lower extremity swelling: Eat a low salt diet. Salt makes the body hold onto extra fluid which causes swelling. Sit with legs elevated. For example, in the recliner or on an ottoman.  Wear knee-high compression stockings during the daytime. Ones labeled 15-20 mmHg provide good compression.

## 2023-03-01 NOTE — Progress Notes (Signed)
Cardiology Office Note:  .   Date:  03/01/2023  ID:  Nicole Jimenez, DOB 10/14/1934, MRN [id number] PCP: Willow Ora, MD  [facility] Providers Cardiologist:  Jodelle Red, MD    History of Present Illness: .   Nicole Jimenez is a 89 y.o. female with history of permanent atrial fibrillation, arterial occlusion of femoral artery due to embolism, chronic systolic heart failure, hypertension, hyperlipidemia, subclinical hypothyroidism, borderline diabetes mellitus, GERD, depression.  Established with Dr. Cristal Deer 06/2021 for management of permanent atrial fibrillation.  Prior cardioversion 03/2018 with no associated improvement in symptoms and has since been rate controlled.  TTE 02/2018 normal LVEF, mild MR/TR.  Last seen 5 12/2022 noting lots of stress but overall doing well from a cardiac perspective.  Feeling overall well since last seen. She reports still with some stress which she is doing her bst to manage well.  Reports being overall sedentary and we discussed possible participation in PREP exercise program. Reports her swelling is mostly in her right foot and worse by end of day.  This is same side as her prior intervention and also notes partaking in high sodium diet.  ROS: Please see the history of present illness.    All other systems reviewed and are negative.   Studies Reviewed: .            Risk Assessment/Calculations:    CHA2DS2-VASc Score = 9   This indicates a 12.2% annual risk of stroke. The patient's score is based upon: CHF History: 1 HTN History: 1 Diabetes History: 1 Stroke History: 2 (thromboembolism) Vascular Disease History: 1 Age Score: 2 Gender Score: 1            Physical Exam:   VS:  BP 128/64   Pulse 77   Ht 5\' 1"  (1.549 m)   Wt 142 lb 14.4 oz (64.8 kg)   SpO2 96%   BMI 27.00 kg/m    Wt Readings from Last 3 Encounters:  03/01/23 142 lb 14.4 oz (64.8 kg)  01/21/23 140 lb 9.6 oz (63.8 kg)  12/24/22 138 lb 6.1 oz  (62.8 kg)    GEN: Well nourished, well developed in no acute distress NECK: No JVD; No carotid bruits CARDIAC: IRIR,  no murmurs, rubs, gallops RESPIRATORY:  Clear to auscultation without rales, wheezing or rhonchi  ABDOMEN: Soft, non-tender, non-distended EXTREMITIES:  Non pitting bilateral pretibial edema; No deformity   ASSESSMENT AND PLAN: .    Permanent atrial fibrillation/hypercoagulable state- Rate controlled today. Unaware of atrial fibrillation with no palpitations nor symptoms.  Continue carvedilol 12.5 mg twice daily, refill provided.  Continue Eliquis 2.5 mg twice daily.  Reduced dose due to age, renal function. CHA2DS2-VASc Score = 9 [CHF History: 1, HTN History: 1, Diabetes History: 1, Stroke History: 2 (thromboembolism), Vascular Disease History: 1, Age Score: 2, Gender Score: 1].  Therefore, the patient's annual risk of stroke is 12.2 %.     Lower extremity edema - Nonpitting bilateral lower extremity edema.  Likely due to high sodium diet.  We discussed tactics reviewed reduce sodium.  Encouraged to elevate legs when sitting.  We discussed utilization of compression stockings.  HCTZ previously discontinued due to hypotension.  HTN- BP well controlled. Continue current antihypertensive regimen.  Discussed to monitor BP at home at least 2 hours after medications and sitting for 5-10 minutes.   Inactivity- Refer to PREP exercise program.  HLD - Continue Rosuvastatin 40mg  daily.   DM2 - Continue to follow with PCP.  Dispo: follow up in 4 mos per patient preference  Signed, Alver Sorrow, NP

## 2023-03-10 ENCOUNTER — Encounter: Payer: Self-pay | Admitting: Family Medicine

## 2023-03-11 ENCOUNTER — Ambulatory Visit: Payer: Medicare Other

## 2023-03-11 ENCOUNTER — Encounter: Payer: Self-pay | Admitting: Family Medicine

## 2023-03-11 ENCOUNTER — Ambulatory Visit (INDEPENDENT_AMBULATORY_CARE_PROVIDER_SITE_OTHER): Payer: Medicare Other | Admitting: Family Medicine

## 2023-03-11 VITALS — BP 122/74 | HR 62 | Temp 97.2°F | Ht 61.0 in | Wt 137.2 lb

## 2023-03-11 DIAGNOSIS — M546 Pain in thoracic spine: Secondary | ICD-10-CM

## 2023-03-11 DIAGNOSIS — R0781 Pleurodynia: Secondary | ICD-10-CM

## 2023-03-11 DIAGNOSIS — I700 Atherosclerosis of aorta: Secondary | ICD-10-CM | POA: Diagnosis not present

## 2023-03-11 DIAGNOSIS — R1013 Epigastric pain: Secondary | ICD-10-CM

## 2023-03-11 DIAGNOSIS — R918 Other nonspecific abnormal finding of lung field: Secondary | ICD-10-CM | POA: Diagnosis not present

## 2023-03-11 DIAGNOSIS — I517 Cardiomegaly: Secondary | ICD-10-CM | POA: Diagnosis not present

## 2023-03-11 LAB — COMPREHENSIVE METABOLIC PANEL
ALT: 23 U/L (ref 0–35)
AST: 25 U/L (ref 0–37)
Albumin: 4.2 g/dL (ref 3.5–5.2)
Alkaline Phosphatase: 75 U/L (ref 39–117)
BUN: 27 mg/dL — ABNORMAL HIGH (ref 6–23)
CO2: 30 meq/L (ref 19–32)
Calcium: 9.5 mg/dL (ref 8.4–10.5)
Chloride: 105 meq/L (ref 96–112)
Creatinine, Ser: 1.53 mg/dL — ABNORMAL HIGH (ref 0.40–1.20)
GFR: 30.68 mL/min — ABNORMAL LOW (ref >60.00)
Glucose, Bld: 138 mg/dL — ABNORMAL HIGH (ref 70–99)
Potassium: 4.1 meq/L (ref 3.5–5.1)
Sodium: 144 meq/L (ref 135–145)
Total Bilirubin: 0.7 mg/dL (ref 0.2–1.2)
Total Protein: 8 g/dL (ref 6.0–8.3)

## 2023-03-11 LAB — CBC WITH DIFFERENTIAL/PLATELET
Basophils Absolute: 0 10*3/uL (ref 0.0–0.1)
Basophils Relative: 0.7 % (ref 0.0–3.0)
Eosinophils Absolute: 0.1 10*3/uL (ref 0.0–0.7)
Eosinophils Relative: 2.6 % (ref 0.0–5.0)
HCT: 42.6 % (ref 36.0–46.0)
Hemoglobin: 13.9 g/dL (ref 12.0–15.0)
Lymphocytes Relative: 28.3 % (ref 12.0–46.0)
Lymphs Abs: 1.3 10*3/uL (ref 0.7–4.0)
MCHC: 32.5 g/dL (ref 30.0–36.0)
MCV: 89.5 fL (ref 78.0–100.0)
Monocytes Absolute: 0.4 10*3/uL (ref 0.1–1.0)
Monocytes Relative: 9.7 % (ref 3.0–12.0)
Neutro Abs: 2.6 10*3/uL (ref 1.4–7.7)
Neutrophils Relative %: 58.7 % (ref 43.0–77.0)
Platelets: 123 10*3/uL — ABNORMAL LOW (ref 150.0–400.0)
RBC: 4.77 Mil/uL (ref 3.87–5.11)
RDW: 15 % (ref 11.5–15.5)
WBC: 4.5 10*3/uL (ref 4.0–10.5)

## 2023-03-11 LAB — SEDIMENTATION RATE: Sed Rate: 77 mm/h — ABNORMAL HIGH (ref 0–30)

## 2023-03-11 LAB — LIPASE: Lipase: 46 U/L (ref 11.0–59.0)

## 2023-03-11 NOTE — Progress Notes (Signed)
Subjective  CC:  Chief Complaint  Patient presents with   Abdominal Pain    Pt c.o of abd pain starting a few days ago, every time pt swallows or breathes she has left side shoulder pain that subsides to chest area, and abd area. Pain comes and goes. Pt states that it feels like she is having hunger pain. Pt has only tried Tylenol, did not help much. Last night pt had one episodes of soft bowels.     HPI: Nicole Jimenez is a 89 y.o. female who presents to the office today to address the problems listed above in the chief complaint. Discussed the use of AI scribe software for clinical note transcription with the patient, who gave verbal consent to proceed.  History of Present Illness   The patient presents with left-sided back pain and gastrointestinal discomfort.  She experienced left-sided back pain starting two nights ago, waking her from sleep with discomfort around the middle shoulder blade. The pain was positional, worsening with certain positions, and was associated with eating or swallowing. It sometimes radiated from the upper left back to the front, but was more painful in the back. No sweating, nausea, or difficulty breathing was noted. The pain improved with Tylenol and is currently resolved.  She had gastrointestinal discomfort, including a need to move her bowels at 3:30 AM, which was unusual for her. This was followed by a regular bowel movement and watery diarrhea. She has a history of gastric issues for which she was on omeprazole, but she stopped taking it as she felt it was no longer needed. Recently, she experienced similar discomfort described as 'hunger pains' despite having eaten.  She reports some weight loss, which was not intentional, and attributes it to irregular meal times. She typically eats brunch between 11:30 AM and 12:00 PM, feels hungry again around 3:30 PM, and has a regular dinner if her son is home. She feels she is eating enough but acknowledges it may  be less than before.  She has been experiencing emotional stress due to a close friend's health issues and family dynamics.  H/o chronic afib on eloquis. No melana or rectal bleeding. No le edema. No palpitations.  She feels well this morning. No pain or sob.     Wt Readings from Last 3 Encounters:  03/11/23 137 lb 3.2 oz (62.2 kg)  03/01/23 142 lb 14.4 oz (64.8 kg)  01/21/23 140 lb 9.6 oz (63.8 kg)     Assessment  1. Pleuritic chest pain   2. Acute left-sided thoracic back pain   3. Dyspepsia      Plan  Assessment and Plan    Upper Abdominal Pain Recent onset of left upper quadrant pain radiating to the back, associated with positional changes and deep breathing. Pain improved with Tylenol. No associated nausea, vomiting, or shortness of breath. History of gastritis and pancreatitis. -Order chest X-ray to rule out any lung or pleural pathology. -Order blood work including complete blood count, liver function tests, and pancreatic enzymes. -Resume Omeprazole for possible gastritis. -Return for follow-up appointment in one week. -To ER for worsening or recurrent pain; differential is broad including pericardial effusion, lung pathology, GI etiologies. She appears well now.  Unintentional Weight Loss Noted weight loss without trying. Eating habits have been irregular. -Encourage regular meals and adequate nutrition. -Monitor weight closely.  Diarrhea One episode of nocturnal watery diarrhea, unusual for the patient. -Monitor for further episodes.  Stress Patient reports recent emotional stress due to  a friend's health and family issues. -Encourage patient to maintain boundaries and self-care while supporting others.        Orders Placed This Encounter  Procedures   DG Chest 2 View   CBC with Differential/Platelet   Comprehensive metabolic panel   Lipase   Sedimentation rate   No orders of the defined types were placed in this encounter.    I reviewed the  patients updated PMH, FH, and SocHx.    Patient Active Problem List   Diagnosis Date Noted   Chronic kidney disease, stage 3b (HCC) 11/02/2022    Priority: High   History of arterial embolism 08/21/2018    Priority: High   Anticoagulant long-term use 03/06/2018    Priority: High   Permanent atrial fibrillation (HCC) 01/23/2018    Priority: High   Chronic diastolic CHF (congestive heart failure) (HCC) 03/03/2017    Priority: High   Type 2 diabetes mellitus with peripheral neuropathy (HCC) 01/24/2017    Priority: High   Subclinical hyperthyroidism 01/21/2014    Priority: High   Essential hypertension 02/23/2013    Priority: High   Combined hyperlipidemia associated with type 2 diabetes mellitus (HCC) 02/23/2013    Priority: High   Major depression, recurrent, chronic (HCC) 07/18/2012    Priority: High   Thrombocytopenia (HCC) 11/02/2022    Priority: Medium    Gout 11/01/2022    Priority: Medium    DJD of right shoulder 01/24/2017    Priority: Medium    Bereavement due to life event 03/05/2016    Priority: Medium    Osteoporosis of femur without pathological fracture 08/23/2015    Priority: Medium    Idiopathic scoliosis of thoracic spine 07/09/2014    Priority: Medium    Sjogren's syndrome (HCC) 08/04/2012    Priority: Medium    DDD (degenerative disc disease), lumbosacral 07/15/2012    Priority: Medium    Lumbosacral spondylosis 07/15/2012    Priority: Medium    Osteoarthritis of knee 07/15/2012    Priority: Medium    Bilateral edema of lower extremity 01/26/2017    Priority: Low   Bilateral hip bursitis 07/17/2015    Priority: Low   Age-related nuclear cataract of both eyes 03/11/2015    Priority: Low   Insomnia 09/04/2013    Priority: Low   Allergic rhinitis 07/15/2012    Priority: Low   Current Meds  Medication Sig   Acetaminophen 500 MG coapsule Take 1,000 mg by mouth every 6 (six) hours as needed for pain.   apixaban (ELIQUIS) 2.5 MG TABS tablet Take 1  tablet (2.5 mg total) by mouth 2 (two) times daily.   Calcium Carb-Cholecalciferol 600-800 MG-UNIT CHEW Chew 1 each by mouth daily.    carvedilol (COREG) 12.5 MG tablet Take 1 tablet (12.5 mg total) by mouth 2 (two) times daily.   cycloSPORINE (RESTASIS) 0.05 % ophthalmic emulsion 1 drop 2 (two) times daily.   diclofenac sodium (VOLTAREN) 1 % GEL APPLY 2 G TOPICALLY 4 (FOUR) TIMES DAILY AS NEEDED. **PA DENIED** (Patient taking differently: Apply 2 g topically daily as needed (For pain).)   diltiazem (CARDIZEM CD) 180 MG 24 hr capsule TAKE 1 CAPSULE (180 MG TOTAL) BY MOUTH EVERY MORNING   empagliflozin (JARDIANCE) 10 MG TABS tablet Take 1 tablet (10 mg total) by mouth daily.   Glucosamine-MSM-Hyaluronic Acd (JOINT HEALTH PO) Take 1 tablet by mouth daily.   glucose blood (ONETOUCH ULTRA) test strip USE TWICE DAILY TO CHECK BLOOD SUGAR - E11.40   Krill Oil  500 MG CAPS Take 500 mg by mouth daily.   Multiple Vitamins-Minerals (MULTIVITAMIN ADULT PO) Take by mouth.   rosuvastatin (CRESTOR) 40 MG tablet Take 1 tablet (40 mg total) by mouth daily.   vortioxetine HBr (TRINTELLIX) 10 MG TABS tablet Take 1 tablet (10 mg total) by mouth daily.    Allergies: Patient is allergic to amoxicillin-pot clavulanate, amoxicillin, and clarithromycin. Family History: Patient family history includes Arthritis in her mother and sister; Diabetes in her father and sister; Healthy in her daughter, son, and son; Heart attack in her father and mother; Hyperlipidemia in her father; Other in her son. Social History:  Patient  reports that she has never smoked. She has never used smokeless tobacco. She reports that she does not drink alcohol and does not use drugs.  Review of Systems: Constitutional: Negative for fever malaise or anorexia Cardiovascular: negative for chest pain Respiratory: negative for SOB or persistent cough Gastrointestinal: negative for abdominal pain  Objective  Vitals: BP 122/74   Pulse 62    Temp (!) 97.2 F (36.2 C)   Ht 5\' 1"  (1.549 m)   Wt 137 lb 3.2 oz (62.2 kg)   SpO2 93%   BMI 25.92 kg/m  General: no acute distress , A&Ox3, well appearing HEENT: PEERL, conjunctiva normal, neck is supple Cardiovascular:  irreg irreg w/o murmur or rub Respiratory:  Good breath sounds bilaterally, CTAB with normal respiratory effort Left subscapular area w/ mild ttp  Left shoulder normal exam Abdomen: soft, no midepigastric ttp, very minimal LUQ ttp w/o mass, rebound or guarding. Nl BS No edema Skin:  Warm, no rashes  Commons side effects, risks, benefits, and alternatives for medications and treatment plan prescribed today were discussed, and the patient expressed understanding of the given instructions. Patient is instructed to call or message via MyChart if he/she has any questions or concerns regarding our treatment plan. No barriers to understanding were identified. We discussed Red Flag symptoms and signs in detail. Patient expressed understanding regarding what to do in case of urgent or emergency type symptoms.  Medication list was reconciled, printed and provided to the patient in AVS. Patient instructions and summary information was reviewed with the patient as documented in the AVS. This note was prepared with assistance of Dragon voice recognition software. Occasional wrong-word or sound-a-like substitutions may have occurred due to the inherent limitations of voice recognition software

## 2023-03-14 ENCOUNTER — Encounter: Payer: Self-pay | Admitting: Family Medicine

## 2023-03-14 NOTE — Progress Notes (Signed)
See mychart note Dear Ms. Lennon, Your labwork is stable. I am waiting for your chest xray to be read by the radiologist. I hope you are feeling better.  Sincerely, Dr. Mardelle Matte

## 2023-03-18 ENCOUNTER — Ambulatory Visit (INDEPENDENT_AMBULATORY_CARE_PROVIDER_SITE_OTHER): Payer: Medicare Other | Admitting: Family Medicine

## 2023-03-18 ENCOUNTER — Encounter: Payer: Self-pay | Admitting: Family Medicine

## 2023-03-18 VITALS — BP 122/67 | HR 66 | Temp 97.3°F | Ht 61.0 in | Wt 137.6 lb

## 2023-03-18 DIAGNOSIS — M546 Pain in thoracic spine: Secondary | ICD-10-CM

## 2023-03-18 DIAGNOSIS — R1013 Epigastric pain: Secondary | ICD-10-CM | POA: Diagnosis not present

## 2023-03-18 DIAGNOSIS — D696 Thrombocytopenia, unspecified: Secondary | ICD-10-CM | POA: Diagnosis not present

## 2023-03-18 LAB — B12 AND FOLATE PANEL
Folate: >25.2 ng/mL (ref >5.9)
Vitamin B-12: 449 pg/mL (ref 211–911)

## 2023-03-18 NOTE — Progress Notes (Signed)
 Subjective  CC:  Chief Complaint  Patient presents with   Follow-up    Pt here to discuss lab results from last Friday , pt has dark raised mole on right side of face, does not bother pt.     HPI: Nicole Jimenez is a 89 y.o. female who presents to the office today to address the problems listed above in the chief complaint. Discussed the use of AI scribe software for clinical note transcription with the patient, who gave verbal consent to proceed.  History of Present Illness   Nicole Jimenez is an 89 year old female who presents with gastrointestinal symptoms and low platelet count.  See last note: all pain and chest pain has resolved.   She experiences gastrointestinal discomfort characterized by burping after eating, which is more pronounced after meals. This symptom has improved since she started taking a stomach medication before breakfast.  She has a history of anemia, which has resolved, but her platelet count remains mildly low. This has been a concern for about a year, although the levels have improved slightly. She has not undergone any recent colonoscopies or endoscopies. She recalls taking vitamin B12 in the past but is not currently on it. She is unsure about the cause of her previous anemia.  Her diet is not balanced, and she eats erratically, sometimes skipping meals or having second servings. She keeps protein drinks on hand for meal replacements when she skips meals.  She expresses concern about a bump on her skin that she noticed has been present for a while, unsure if it has changed in appearance. She also mentions another bump in a skin fold that has been there for some time.       Assessment  1. Thrombocytopenia (HCC)      Plan  Assessment and Plan    dyspepsia Improved since last visit. Noted to occur postprandially with associated belching. Currently on an unspecified stomach medication. -Continue current stomach medication.  PPI  Thrombocytopenia Mildly low platelet count, improved from previous. Possible association with past anemia. No recent endoscopies or colonoscopies. -Order additional blood tests to further evaluate cause of low platelets. -Consider referral to a hematologist if platelet count worsens.  Skin Lesions Two noted skin lesions, one a cyst and the other a small dark lesion in a skin fold. Both have been present for a while and appear benign -Continue to monitor skin lesions.  General Health Maintenance -Continue current medication regimen. -Check B12 levels. -Encourage balanced diet and maintenance of current weight. -Return for follow-up appointment in April.        Orders Placed This Encounter  Procedures   Pathologist smear review   B12 and Folate Panel   Iron, TIBC and Ferritin Panel   No orders of the defined types were placed in this encounter.    I reviewed the patients updated PMH, FH, and SocHx.    Patient Active Problem List   Diagnosis Date Noted   Chronic kidney disease, stage 3b (HCC) 11/02/2022    Priority: High   History of arterial embolism 08/21/2018    Priority: High   Anticoagulant long-term use 03/06/2018    Priority: High   Permanent atrial fibrillation (HCC) 01/23/2018    Priority: High   Chronic diastolic CHF (congestive heart failure) (HCC) 03/03/2017    Priority: High   Type 2 diabetes mellitus with peripheral neuropathy (HCC) 01/24/2017    Priority: High   Subclinical hyperthyroidism 01/21/2014    Priority: High  Essential hypertension 02/23/2013    Priority: High   Combined hyperlipidemia associated with type 2 diabetes mellitus (HCC) 02/23/2013    Priority: High   Major depression, recurrent, chronic (HCC) 07/18/2012    Priority: High   Thrombocytopenia (HCC) 11/02/2022    Priority: Medium    Gout 11/01/2022    Priority: Medium    DJD of right shoulder 01/24/2017    Priority: Medium    Bereavement due to life event 03/05/2016     Priority: Medium    Osteoporosis of femur without pathological fracture 08/23/2015    Priority: Medium    Idiopathic scoliosis of thoracic spine 07/09/2014    Priority: Medium    Sjogren's syndrome (HCC) 08/04/2012    Priority: Medium    DDD (degenerative disc disease), lumbosacral 07/15/2012    Priority: Medium    Lumbosacral spondylosis 07/15/2012    Priority: Medium    Osteoarthritis of knee 07/15/2012    Priority: Medium    Bilateral edema of lower extremity 01/26/2017    Priority: Low   Bilateral hip bursitis 07/17/2015    Priority: Low   Age-related nuclear cataract of both eyes 03/11/2015    Priority: Low   Insomnia 09/04/2013    Priority: Low   Allergic rhinitis 07/15/2012    Priority: Low   Current Meds  Medication Sig   Acetaminophen  500 MG coapsule Take 1,000 mg by mouth every 6 (six) hours as needed for pain.   apixaban  (ELIQUIS ) 2.5 MG TABS tablet Take 1 tablet (2.5 mg total) by mouth 2 (two) times daily.   Calcium  Carb-Cholecalciferol 600-800 MG-UNIT CHEW Chew 1 each by mouth daily.    carvedilol  (COREG ) 12.5 MG tablet Take 1 tablet (12.5 mg total) by mouth 2 (two) times daily.   cycloSPORINE (RESTASIS) 0.05 % ophthalmic emulsion 1 drop 2 (two) times daily.   diclofenac  sodium (VOLTAREN ) 1 % GEL APPLY 2 G TOPICALLY 4 (FOUR) TIMES DAILY AS NEEDED. **PA DENIED** (Patient taking differently: Apply 2 g topically daily as needed (For pain).)   diltiazem  (CARDIZEM  CD) 180 MG 24 hr capsule TAKE 1 CAPSULE (180 MG TOTAL) BY MOUTH EVERY MORNING   empagliflozin  (JARDIANCE ) 10 MG TABS tablet Take 1 tablet (10 mg total) by mouth daily.   Glucosamine-MSM-Hyaluronic Acd (JOINT HEALTH PO) Take 1 tablet by mouth daily.   glucose blood (ONETOUCH ULTRA) test strip USE TWICE DAILY TO CHECK BLOOD SUGAR - E11.40   Krill Oil 500 MG CAPS Take 500 mg by mouth daily.   Multiple Vitamins-Minerals (MULTIVITAMIN ADULT PO) Take by mouth.   omeprazole  (PRILOSEC) 20 MG capsule Take 20 mg by mouth  daily.   rosuvastatin  (CRESTOR ) 40 MG tablet Take 1 tablet (40 mg total) by mouth daily.   vortioxetine  HBr (TRINTELLIX ) 10 MG TABS tablet Take 1 tablet (10 mg total) by mouth daily.    Allergies: Patient is allergic to amoxicillin-pot clavulanate, amoxicillin, and clarithromycin. Family History: Patient family history includes Arthritis in her mother and sister; Diabetes in her father and sister; Healthy in her daughter, son, and son; Heart attack in her father and mother; Hyperlipidemia in her father; Other in her son. Social History:  Patient  reports that she has never smoked. She has never used smokeless tobacco. She reports that she does not drink alcohol and does not use drugs.  Review of Systems: Constitutional: Negative for fever malaise or anorexia Cardiovascular: negative for chest pain Respiratory: negative for SOB or persistent cough Gastrointestinal: negative for abdominal pain  Objective  Vitals:  BP 122/67   Pulse 66   Temp (!) 97.3 F (36.3 C)   Ht 5' 1 (1.549 m)   Wt 137 lb 9.6 oz (62.4 kg)   SpO2 97%   BMI 26.00 kg/m  General: no acute distress , A&Ox3 HEENT: PEERL, conjunctiva normal, neck is supple Cardiovascular:  RRR without murmur or gallop.  Respiratory:  Good breath sounds bilaterally, CTAB with normal respiratory effort Skin:  Warm, no rashes, dark moles on face/ no flaking, regular borders and color    Commons side effects, risks, benefits, and alternatives for medications and treatment plan prescribed today were discussed, and the patient expressed understanding of the given instructions. Patient is instructed to call or message via MyChart if he/she has any questions or concerns regarding our treatment plan. No barriers to understanding were identified. We discussed Red Flag symptoms and signs in detail. Patient expressed understanding regarding what to do in case of urgent or emergency type symptoms.  Medication list was reconciled, printed and  provided to the patient in AVS. Patient instructions and summary information was reviewed with the patient as documented in the AVS. This note was prepared with assistance of Dragon voice recognition software. Occasional wrong-word or sound-a-like substitutions may have occurred due to the inherent limitations of voice recognition software

## 2023-03-18 NOTE — Patient Instructions (Signed)
 Please follow up as scheduled for your next visit with me: 04/21/2023   If you have any questions or concerns, please don't hesitate to send me a message via MyChart or call the office at [phone]. Thank you for visiting with us  today! It's our pleasure caring for you.   VISIT SUMMARY:  Today, we discussed your gastrointestinal symptoms, low platelet count, and skin lesions. Your stomach discomfort has improved with your current medication. We also reviewed your history of anemia and your diet. Additionally, we examined two skin lesions that you have noticed.  YOUR PLAN:  -EPIGASTRIC DISCOMFORT: Epigastric discomfort refers to pain or discomfort in the upper abdomen. Your symptoms have improved with the stomach medication you are taking. Please continue with this medication as it seems to be helping.  -THROMBOCYTOPENIA: Thrombocytopenia means having a low platelet count, which can affect blood clotting. Your platelet count has improved slightly but remains mildly low. We will order additional blood tests to investigate further and may refer you to a hematologist if your platelet count worsens.  -SKIN LESIONS: You have two skin lesions: one appears to be a cyst, and the other is a small dark lesion in a skin fold. Both have been present for some time. We will continue to monitor these lesions.  -GENERAL HEALTH MAINTENANCE: We will check your B12 levels and encourage you to maintain a balanced diet and your current weight. Please continue with your current medication regimen.  INSTRUCTIONS:  Please return for a follow-up appointment in April. We will also conduct additional blood tests to further evaluate your low platelet count.

## 2023-03-19 LAB — PATHOLOGIST SMEAR REVIEW

## 2023-03-19 LAB — IRON,TIBC AND FERRITIN PANEL
%SAT: 22 % (ref 16–45)
Ferritin: 213 ng/mL (ref 16–288)
Iron: 67 ug/dL (ref 45–160)
TIBC: 300 ug/dL (ref 250–450)

## 2023-03-21 ENCOUNTER — Encounter: Payer: Self-pay | Admitting: Family Medicine

## 2023-03-21 ENCOUNTER — Other Ambulatory Visit: Payer: Self-pay

## 2023-03-21 DIAGNOSIS — D696 Thrombocytopenia, unspecified: Secondary | ICD-10-CM

## 2023-03-21 NOTE — Progress Notes (Signed)
 See my chart note.

## 2023-03-22 ENCOUNTER — Other Ambulatory Visit (INDEPENDENT_AMBULATORY_CARE_PROVIDER_SITE_OTHER): Payer: Medicare Other

## 2023-03-22 DIAGNOSIS — D696 Thrombocytopenia, unspecified: Secondary | ICD-10-CM

## 2023-03-24 LAB — PERIPHERAL BLOOD SMEAR REVIEW

## 2023-03-24 LAB — EXTRA LAV TOP TUBE

## 2023-03-30 ENCOUNTER — Other Ambulatory Visit: Payer: Self-pay | Admitting: Family Medicine

## 2023-03-30 NOTE — Progress Notes (Signed)
This test was canceled again?! I need a cbc and peripheral blood smear review for dx: thrombocytopenia.  Thanks, Dr. Mardelle Matte '

## 2023-04-21 ENCOUNTER — Ambulatory Visit (INDEPENDENT_AMBULATORY_CARE_PROVIDER_SITE_OTHER): Payer: Medicare Other | Admitting: Family Medicine

## 2023-04-21 ENCOUNTER — Encounter: Payer: Self-pay | Admitting: Family Medicine

## 2023-04-21 VITALS — BP 128/62 | HR 78 | Temp 97.7°F | Ht 61.0 in | Wt 137.8 lb

## 2023-04-21 DIAGNOSIS — E119 Type 2 diabetes mellitus without complications: Secondary | ICD-10-CM | POA: Diagnosis not present

## 2023-04-21 DIAGNOSIS — E1142 Type 2 diabetes mellitus with diabetic polyneuropathy: Secondary | ICD-10-CM

## 2023-04-21 DIAGNOSIS — Z7984 Long term (current) use of oral hypoglycemic drugs: Secondary | ICD-10-CM

## 2023-04-21 DIAGNOSIS — I10 Essential (primary) hypertension: Secondary | ICD-10-CM | POA: Diagnosis not present

## 2023-04-21 LAB — MICROALBUMIN / CREATININE URINE RATIO
Creatinine,U: 49.7 mg/dL
Microalb Creat Ratio: 582.6 mg/g — ABNORMAL HIGH (ref 0.0–30.0)
Microalb, Ur: 29 mg/dL — ABNORMAL HIGH (ref 0.0–1.9)

## 2023-04-21 LAB — POCT GLYCOSYLATED HEMOGLOBIN (HGB A1C): Hemoglobin A1C: 7 % — AB (ref 4.0–5.6)

## 2023-04-21 NOTE — Progress Notes (Signed)
 Subjective  CC:  Chief Complaint  Patient presents with   Diabetes   Hypertension    HPI: Nicole Jimenez is a 89 y.o. female who presents to the office today for follow up of diabetes and problems listed above in the chief complaint.  Diabetes follow up: Her diabetic control is reported as Unchanged. Doing well.  She denies exertional CP or SOB or symptomatic hypoglycemia. She denies foot sores or paresthesias.  BP is controlled. No more chest pains (atypical).  Some gi "soreness" that resolves with BM. No changes in habits, blood in stool or diarrhea. No fevers. Nl appetite. No upper gi sxs. Wt Readings from Last 3 Encounters:  04/21/23 137 lb 12.8 oz (62.5 kg)  03/18/23 137 lb 9.6 oz (62.4 kg)  03/11/23 137 lb 3.2 oz (62.2 kg)    BP Readings from Last 3 Encounters:  04/21/23 128/62  03/18/23 122/67  03/11/23 122/74    Assessment  1. Type 2 diabetes mellitus with peripheral neuropathy (HCC)   2. Diabetes mellitus treated with oral medication (HCC)   3. Essential hypertension      Plan  Diabetes is currently well controlled. Continue same.  HTN is controlled. Continue same Abd pain: reassured. Monitor   Follow up: 3 mo Orders Placed This Encounter  Procedures   Microalbumin / creatinine urine ratio   POCT HgB A1C   No orders of the defined types were placed in this encounter.     Immunization History  Administered Date(s) Administered   Fluad Quad(high Dose 65+) 11/20/2018, 11/28/2020, 10/23/2021   Fluad Trivalent(High Dose 65+) 11/01/2022   Influenza Split 01/24/2012   Influenza, High Dose Seasonal PF 12/02/2013, 01/20/2015, 12/28/2016, 10/26/2019   Influenza,inj,Quad PF,6+ Mos 11/21/2015, 11/18/2017   Influenza-Unspecified 01/24/2012, 01/05/2014, 01/05/2014, 01/20/2015   PFIZER Comirnaty(Gray Top)Covid-19 Tri-Sucrose Vaccine 05/20/2020   PFIZER(Purple Top)SARS-COV-2 Vaccination 03/01/2019, 03/22/2019, 11/16/2019   Pfizer Covid-19 Vaccine Bivalent  Booster 86yrs & up 12/16/2020   Pfizer(Comirnaty)Fall Seasonal Vaccine 12 years and older 11/29/2021   Pneumococcal Conjugate-13 03/06/2015, 03/06/2015   Pneumococcal Polysaccharide-23 06/18/2016   Respiratory Syncytial Virus Vaccine,Recomb Aduvanted(Arexvy) 01/17/2023   Zoster Recombinant(Shingrix) 03/09/2018, 08/21/2018   Zoster, Live 10/09/2012    Diabetes Related Lab Review: Lab Results  Component Value Date   HGBA1C 7.0 (A) 04/21/2023   HGBA1C 6.4 (A) 01/21/2023   HGBA1C 7.3 (H) 11/01/2022    Lab Results  Component Value Date   MICROALBUR 24.7 (H) 11/01/2022   Lab Results  Component Value Date   CREATININE 1.53 (H) 03/11/2023   BUN 27 (H) 03/11/2023   NA 144 03/11/2023   K 4.1 03/11/2023   CL 105 03/11/2023   CO2 30 03/11/2023   Lab Results  Component Value Date   CHOL 136 11/01/2022   CHOL 176 10/23/2021   CHOL 177 04/17/2021   Lab Results  Component Value Date   HDL 65.70 11/01/2022   HDL 71.50 10/23/2021   HDL 73.10 04/17/2021   Lab Results  Component Value Date   LDLCALC 51 11/01/2022   LDLCALC 84 10/23/2021   LDLCALC 80 04/17/2021   Lab Results  Component Value Date   TRIG 97.0 11/01/2022   TRIG 106.0 10/23/2021   TRIG 122.0 04/17/2021   Lab Results  Component Value Date   CHOLHDL 2 11/01/2022   CHOLHDL 2 10/23/2021   CHOLHDL 2 04/17/2021   No results found for: "LDLDIRECT" The ASCVD Risk score (Arnett DK, et al., 2019) failed to calculate for the following reasons:   The  2019 ASCVD risk score is only valid for ages 5 to 79 I have reviewed the PMH, Fam and Soc history. Patient Active Problem List   Diagnosis Date Noted Date Diagnosed   Chronic kidney disease, stage 3b (HCC) 11/02/2022     Priority: High    Had mild ckd, likely HTN/DM related. Then Had ATN 04/2022 w/ highest creatinine 4+; never returned completely back to baseline; GFR aroudn 30 is new baseline 10/2022    History of arterial embolism 08/21/2018     Priority: High     Managed by vascular. 2018    Anticoagulant long-term use 03/06/2018     Priority: High   Permanent atrial fibrillation (HCC) 01/23/2018     Priority: High   Chronic diastolic CHF (congestive heart failure) (HCC) 03/03/2017     Priority: High    Echo 11/2016, nl EF but LVH and diastolic dysfunction     Type 2 diabetes mellitus with peripheral neuropathy (HCC) 2017-02-05     Priority: High    Had been on metformin; stopped in march 2024 during hospitalization for AKI on ACE.  H/o pancreatitis: avoiding glp-1 and cautious if sglt2-I started jardiance 10/24    Subclinical hyperthyroidism 01/21/2014     Priority: High    Managed by endocrine    Essential hypertension 02/23/2013     Priority: High    improved on recheck. Previously, lisinopril and HCTZ stopped 04/2022 during admission for AKI/ATN. HCTZ has been restarted -BP improved with change from atenolol to carvedilol -if additional BP control needed at follow up, could change diltiazem to amlodipine and monitor HR -stressed salt avoidance. She is working on activity     Combined hyperlipidemia associated with type 2 diabetes mellitus (HCC) 02/23/2013     Priority: High   Major depression, recurrent, chronic (HCC) 07/18/2012     Priority: High   Thrombocytopenia (HCC) 11/02/2022     Priority: Medium     First noted March 2024 during hospitalization for AKI and pancreatitis.  Improved posthospitalization.  September 2024, platelets 121.  Unclear cause, needs workup if persist.    Gout 11/01/2022     Priority: Medium    DJD of right shoulder Feb 05, 2017     Priority: Medium    Bereavement due to life event 03/05/2016     Priority: Medium     Her husband of 56 years died in 06-Feb-2016    Osteoporosis of femur without pathological fracture 08/23/2015     Priority: Medium     DEXA 11/2022: lowest T -=-2.3 FRAX score: 10 year major osteoporotic risk: 16%. 10 year hip fracture risk: 4.9%. The thresholds for treatment are 20% and  3%, respectively. She will consider fosamax but defer decision until 2025.  DEXA 11/2020: lowest T =-2.3, left femoral neck, elevated FRAX score Major Osteoporotic Fracture: 29.4% Hip Fracture:                19.1% Recommended fosamax but pt never started.   dexa 2017: T = -1.0 at femur, elevated FRAX score.  Repeat 2019 Osteopenia with elevated hip frac score of 10%:  T lowest = - 1.8 Major Osteoporotic Fracture: 17.1% Hip Fracture:                10.4% Population:                  [place] (Asian) Risk Factors:                Family Hist. (Parent  hip fracture) Offer medications due to elevated hip fracture risk score.     Idiopathic scoliosis of thoracic spine 07/09/2014     Priority: Medium    Sjogren's syndrome (HCC) 08/04/2012     Priority: Medium    DDD (degenerative disc disease), lumbosacral 07/15/2012     Priority: Medium    Lumbosacral spondylosis 07/15/2012     Priority: Medium    Osteoarthritis of knee 07/15/2012     Priority: Medium    Bilateral edema of lower extremity 01/26/2017     Priority: Low   Bilateral hip bursitis 07/17/2015     Priority: Low   Age-related nuclear cataract of both eyes 03/11/2015     Priority: Low   Insomnia 09/04/2013     Priority: Low   Allergic rhinitis 07/15/2012     Priority: Low    Social History: Patient  reports that she has never smoked. She has never used smokeless tobacco. She reports that she does not drink alcohol and does not use drugs.  Review of Systems: Ophthalmic: negative for eye pain, loss of vision or double vision Cardiovascular: negative for chest pain Respiratory: negative for SOB or persistent cough Gastrointestinal: negative for abdominal pain Genitourinary: negative for dysuria or gross hematuria MSK: negative for foot lesions Neurologic: negative for weakness or gait disturbance  Objective  Vitals: BP 128/62   Pulse 78   Temp 97.7 F (36.5 C)   Ht 5\' 1"  (1.549 m)   Wt 137 lb 12.8 oz (62.5 kg)   SpO2  97%   BMI 26.04 kg/m  General: well appearing, no acute distress  Psych:  Alert and oriented, normal mood and affect HEENT:  Normocephalic, atraumatic, moist mucous membranes, supple neck  Cardiovascular:  Nl S1 and S2, RRR without murmur, gallop or rub. no edema Respiratory:  Good breath sounds bilaterally, CTAB with normal effort, no rales Gastrointestinal: normal BS, soft, nontender, no masses    Diabetic education: ongoing education regarding chronic disease management for diabetes was given today. We continue to reinforce the ABC's of diabetic management: A1c (<7 or 8 dependent upon patient), tight blood pressure control, and cholesterol management with goal LDL < 100 minimally. We discuss diet strategies, exercise recommendations, medication options and possible side effects. At each visit, we review recommended immunizations and preventive care recommendations for diabetics and stress that good diabetic control can prevent other problems. See below for this patient's data.   Commons side effects, risks, benefits, and alternatives for medications and treatment plan prescribed today were discussed, and the patient expressed understanding of the given instructions. Patient is instructed to call or message via MyChart if he/she has any questions or concerns regarding our treatment plan. No barriers to understanding were identified. We discussed Red Flag symptoms and signs in detail. Patient expressed understanding regarding what to do in case of urgent or emergency type symptoms.  Medication list was reconciled, printed and provided to the patient in AVS. Patient instructions and summary information was reviewed with the patient as documented in the AVS. This note was prepared with assistance of Dragon voice recognition software. Occasional wrong-word or sound-a-like substitutions may have occurred due to the inherent limitations of voice recognition software

## 2023-04-25 DIAGNOSIS — H04123 Dry eye syndrome of bilateral lacrimal glands: Secondary | ICD-10-CM | POA: Diagnosis not present

## 2023-04-25 DIAGNOSIS — H43813 Vitreous degeneration, bilateral: Secondary | ICD-10-CM | POA: Diagnosis not present

## 2023-04-25 DIAGNOSIS — H52203 Unspecified astigmatism, bilateral: Secondary | ICD-10-CM | POA: Diagnosis not present

## 2023-04-25 DIAGNOSIS — H5203 Hypermetropia, bilateral: Secondary | ICD-10-CM | POA: Diagnosis not present

## 2023-04-25 DIAGNOSIS — H35371 Puckering of macula, right eye: Secondary | ICD-10-CM | POA: Diagnosis not present

## 2023-04-25 DIAGNOSIS — H524 Presbyopia: Secondary | ICD-10-CM | POA: Diagnosis not present

## 2023-04-25 DIAGNOSIS — Z961 Presence of intraocular lens: Secondary | ICD-10-CM | POA: Diagnosis not present

## 2023-04-25 DIAGNOSIS — E119 Type 2 diabetes mellitus without complications: Secondary | ICD-10-CM | POA: Diagnosis not present

## 2023-04-25 DIAGNOSIS — H26492 Other secondary cataract, left eye: Secondary | ICD-10-CM | POA: Diagnosis not present

## 2023-05-03 DIAGNOSIS — H26492 Other secondary cataract, left eye: Secondary | ICD-10-CM | POA: Diagnosis not present

## 2023-05-08 ENCOUNTER — Other Ambulatory Visit: Payer: Self-pay | Admitting: Family Medicine

## 2023-05-10 ENCOUNTER — Other Ambulatory Visit: Payer: Self-pay

## 2023-05-10 DIAGNOSIS — R809 Proteinuria, unspecified: Secondary | ICD-10-CM

## 2023-05-10 NOTE — Progress Notes (Signed)
 Please call patient:urine test shows she is leaking protein into the urine. Given her chronic kidney disease and this finding, I'd like her to have a consultation with a kidney specialist to ensure she is on all the best medications. Please place referral to renal. Thanks.

## 2023-05-24 ENCOUNTER — Ambulatory Visit (HOSPITAL_BASED_OUTPATIENT_CLINIC_OR_DEPARTMENT_OTHER): Payer: Medicare Other | Admitting: Cardiology

## 2023-05-25 ENCOUNTER — Other Ambulatory Visit: Payer: Self-pay | Admitting: Family Medicine

## 2023-05-31 DIAGNOSIS — I129 Hypertensive chronic kidney disease with stage 1 through stage 4 chronic kidney disease, or unspecified chronic kidney disease: Secondary | ICD-10-CM | POA: Diagnosis not present

## 2023-05-31 DIAGNOSIS — R809 Proteinuria, unspecified: Secondary | ICD-10-CM | POA: Diagnosis not present

## 2023-05-31 DIAGNOSIS — N1832 Chronic kidney disease, stage 3b: Secondary | ICD-10-CM | POA: Diagnosis not present

## 2023-05-31 DIAGNOSIS — E1129 Type 2 diabetes mellitus with other diabetic kidney complication: Secondary | ICD-10-CM | POA: Diagnosis not present

## 2023-06-03 LAB — LAB REPORT - SCANNED
Creatinine, POC: 22.8 mg/dL
EGFR: 32

## 2023-06-09 ENCOUNTER — Ambulatory Visit (HOSPITAL_BASED_OUTPATIENT_CLINIC_OR_DEPARTMENT_OTHER): Admitting: Nurse Practitioner

## 2023-06-09 VITALS — BP 136/70 | HR 55 | Ht 61.0 in | Wt 138.8 lb

## 2023-06-09 DIAGNOSIS — E782 Mixed hyperlipidemia: Secondary | ICD-10-CM | POA: Diagnosis not present

## 2023-06-09 DIAGNOSIS — E1142 Type 2 diabetes mellitus with diabetic polyneuropathy: Secondary | ICD-10-CM

## 2023-06-09 DIAGNOSIS — Z7901 Long term (current) use of anticoagulants: Secondary | ICD-10-CM | POA: Diagnosis not present

## 2023-06-09 DIAGNOSIS — R600 Localized edema: Secondary | ICD-10-CM

## 2023-06-09 DIAGNOSIS — I10 Essential (primary) hypertension: Secondary | ICD-10-CM | POA: Diagnosis not present

## 2023-06-09 DIAGNOSIS — I4821 Permanent atrial fibrillation: Secondary | ICD-10-CM | POA: Diagnosis not present

## 2023-06-09 DIAGNOSIS — E1169 Type 2 diabetes mellitus with other specified complication: Secondary | ICD-10-CM

## 2023-06-09 NOTE — Patient Instructions (Signed)
 Medication Instructions:   Your physician recommends that you continue on your current medications as directed. Please refer to the Current Medication list given to you today.   *If you need a refill on your cardiac medications before your next appointment, please call your pharmacy*  Lab Work:  None ordered.  If you have labs (blood work) drawn today and your tests are completely normal, you will receive your results only by: MyChart Message (if you have MyChart) OR A paper copy in the mail If you have any lab test that is abnormal or we need to change your treatment, we will call you to review the results.  Testing/Procedures:  None ordered.  Follow-Up: At [facility], you and your health needs are our priority.  As part of our continuing mission to provide you with exceptional heart care, our providers are all part of one team.  This team includes your primary Cardiologist (physician) and Advanced Practice Providers or APPs (Physician Assistants and Nurse Practitioners) who all work together to provide you with the care you need, when you need it.  Your next appointment:   6 month(s)  Provider:   Sheryle Donning, MD, Slater Duncan, NP, or Neomi Banks, NP    We recommend signing up for the patient portal called "MyChart".  Sign up information is provided on this After Visit Summary.  MyChart is used to connect with patients for Virtual Visits (Telemedicine).  Patients are able to view lab/test results, encounter notes, upcoming appointments, etc.  Non-urgent messages can be sent to your provider as well.   To learn more about what you can do with MyChart, go to [email or web address].   Other Instructions  Your physician wants you to follow-up in: 6 months.  You will receive a reminder letter in the mail two months in advance. If you don't receive a letter, please call our office to schedule the follow-up appointment.      Mediterranean Diet  Why follow  it? Research shows. Those who follow the Mediterranean diet have a reduced risk of heart disease  The diet is associated with a reduced incidence of Parkinson's and Alzheimer's diseases People following the diet may have longer life expectancies and lower rates of chronic diseases  The Dietary Guidelines for Americans recommends the Mediterranean diet as an eating plan to promote health and prevent disease  What Is the Mediterranean Diet?  Healthy eating plan based on typical foods and recipes of Mediterranean-style cooking The diet is primarily a plant based diet; these foods should make up a majority of meals   Starches - Plant based foods should make up a majority of meals - They are an important sources of vitamins, minerals, energy, antioxidants, and fiber - Choose whole grains, foods high in fiber and minimally processed items  - Typical grain sources include wheat, oats, barley, corn, brown rice, bulgar, farro, millet, polenta, couscous  - Various types of beans include chickpeas, lentils, fava beans, black beans, white beans   Fruits  Veggies - Large quantities of antioxidant rich fruits & veggies; 6 or more servings  - Vegetables can be eaten raw or lightly drizzled with oil and cooked  - Vegetables common to the traditional Mediterranean Diet include: artichokes, arugula, beets, broccoli, brussel sprouts, cabbage, carrots, celery, collard greens, cucumbers, eggplant, kale, leeks, lemons, lettuce, mushrooms, okra, onions, peas, peppers, potatoes, pumpkin, radishes, rutabaga, shallots, spinach, sweet potatoes, turnips, zucchini - Fruits common to the Mediterranean Diet include: apples, apricots, avocados, cherries, clementines, dates, figs, grapefruits,  grapes, melons, nectarines, oranges, peaches, pears, pomegranates, strawberries, tangerines  Fats - Replace butter and margarine with healthy oils, such as olive oil, canola oil, and tahini  - Limit nuts to no more than a handful a day  -  Nuts include walnuts, almonds, pecans, pistachios, pine nuts  - Limit or avoid candied, honey roasted or heavily salted nuts - Olives are central to the Mediterranean diet - can be eaten whole or used in a variety of dishes   Meats Protein - Limiting red meat: no more than a few times a month - When eating red meat: choose lean cuts and keep the portion to the size of deck of cards - Eggs: approx. 0 to 4 times a week  - Fish and lean poultry: at least 2 a week  - Healthy protein sources include, chicken, [place], lean beef, lamb - Increase intake of seafood such as tuna, salmon, trout, mackerel, shrimp, scallops - Avoid or limit high fat processed meats such as sausage and bacon  Dairy - Include moderate amounts of low fat dairy products  - Focus on healthy dairy such as fat free yogurt, skim milk, low or reduced fat cheese - Limit dairy products higher in fat such as whole or 2% milk, cheese, ice cream  Alcohol - Moderate amounts of red wine is ok  - No more than 5 oz daily for women (all ages) and men older than age 32  - No more than 10 oz of wine daily for men younger than 71  Other - Limit sweets and other desserts  - Use herbs and spices instead of salt to flavor foods  - Herbs and spices common to the traditional Mediterranean Diet include: basil, bay leaves, chives, cloves, cumin, fennel, garlic, lavender, marjoram, mint, oregano, parsley, pepper, rosemary, sage, savory, sumac, tarragon, thyme   It's not just a diet, it's a lifestyle:  The Mediterranean diet includes lifestyle factors typical of those in the region  Foods, drinks and meals are best eaten with others and savored Daily physical activity is important for overall good health This could be strenuous exercise like running and aerobics This could also be more leisurely activities such as walking, housework, yard-work, or taking the stairs Moderation is the key; a balanced and healthy diet accommodates most foods and  drinks Consider portion sizes and frequency of consumption of certain foods   Meal Ideas & Options:  Breakfast:  Whole wheat toast or whole wheat English muffins with peanut butter & hard boiled egg Steel cut oats topped with apples & cinnamon and skim milk  Fresh fruit: banana, strawberries, melon, berries, peaches  Smoothies: strawberries, bananas, greek yogurt, peanut butter Low fat greek yogurt with blueberries and granola  Egg white omelet with spinach and mushrooms Breakfast couscous: whole wheat couscous, apricots, skim milk, cranberries  Sandwiches:  Hummus and grilled vegetables (peppers, zucchini, squash) on whole wheat bread   Grilled chicken on whole wheat pita with lettuce, tomatoes, cucumbers or tzatziki  [place] salad on whole wheat bread: tuna salad made with greek yogurt, olives, red peppers, capers, green onions Garlic rosemary lamb pita: lamb sauted with garlic, rosemary, salt & pepper; add lettuce, cucumber, greek yogurt to pita - flavor with lemon juice and black pepper  Seafood:  Mediterranean grilled salmon, seasoned with garlic, basil, parsley, lemon juice and black pepper Shrimp, lemon, and spinach whole-grain pasta salad made with low fat greek yogurt  Seared scallops with lemon orzo  Seared tuna steaks seasoned salt, pepper,  coriander topped with tomato mixture of olives, tomatoes, olive oil, minced garlic, parsley, green onions and cappers  Meats:  Herbed greek chicken salad with kalamata olives, cucumber, feta  Red bell peppers stuffed with spinach, bulgur, lean ground beef (or lentils) & topped with feta   Kebabs: skewers of chicken, tomatoes, onions, zucchini, squash  [place] burgers: made with red onions, mint, dill, lemon juice, feta cheese topped with roasted red peppers Vegetarian Cucumber salad: cucumbers, artichoke hearts, celery, red onion, feta cheese, tossed in olive oil & lemon juice  Hummus and whole grain pita points with a greek salad (lettuce,  tomato, feta, olives, cucumbers, red onion) Lentil soup with celery, carrots made with vegetable broth, garlic, salt and pepper  Tabouli salad: parsley, bulgur, mint, scallions, cucumbers, tomato, radishes, lemon juice, olive oil, salt and pepper.
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
## 2023-06-10 ENCOUNTER — Encounter (HOSPITAL_BASED_OUTPATIENT_CLINIC_OR_DEPARTMENT_OTHER): Payer: Self-pay | Admitting: Nurse Practitioner

## 2023-06-30 ENCOUNTER — Ambulatory Visit (INDEPENDENT_AMBULATORY_CARE_PROVIDER_SITE_OTHER): Payer: Medicare Other

## 2023-06-30 VITALS — Ht 61.0 in | Wt 138.0 lb

## 2023-06-30 DIAGNOSIS — Z713 Dietary counseling and surveillance: Secondary | ICD-10-CM | POA: Diagnosis not present

## 2023-06-30 DIAGNOSIS — Z0000 Encounter for general adult medical examination without abnormal findings: Secondary | ICD-10-CM

## 2023-06-30 NOTE — Patient Instructions (Signed)
 Nicole Jimenez , Thank you for taking time out of your busy schedule to complete your Annual Wellness Visit with me. I enjoyed our conversation and look forward to speaking with you again next year. I, as well as your care team,  appreciate your ongoing commitment to your health goals. Please review the following plan we discussed and let me know if I can assist you in the future. Your Game plan/ To Do List    Referrals: If you haven't heard from the office you've been referred to, please reach out to them at the phone provided.   Follow up Visits: Next Medicare AWV with our clinical staff: 07/12/24   Have you seen your provider in the last 6 months (3 months if uncontrolled diabetes)? Yes Next Office Visit with your provider: 07/19/23  Clinician Recommendations:  Each day, aim for 6 glasses of water, plenty of protein in your diet and try to get up and walk/ stretch every hour for 5-10 minutes at a time.        This is a list of the screening recommended for you and due dates:  Health Maintenance  Topic Date Due   COVID-19 Vaccine (7 - 2024-25 season) 10/10/2022   Medicare Annual Wellness Visit  06/22/2023   Flu Shot  09/09/2023   Hemoglobin A1C  10/22/2023   Complete foot exam   11/01/2023   Eye exam for diabetics  12/28/2023   DEXA scan (bone density measurement)  11/24/2024   Pneumonia Vaccine  Completed   Zoster (Shingles) Vaccine  Completed   HPV Vaccine  Aged Out   Meningitis B Vaccine  Aged Out   DTaP/Tdap/Td vaccine  Discontinued    Advanced directives: (Copy Requested) Please bring a copy of your health care power of attorney and living will to the office to be added to your chart at your convenience. You can mail to [facility] [address] [place], [place] [address] or email to [email or web address] Advance Care Planning is important because it:  [x]  Makes sure you receive the medical care that is consistent with your values, goals, and  preferences  [x]  It provides guidance to your family and loved ones and reduces their decisional burden about whether or not they are making the right decisions based on your wishes.  Follow the link provided in your after visit summary or read over the paperwork we have mailed to you to help you started getting your Advance Directives in place. If you need assistance in completing these, please reach out to us  so that we can help you!  See attachments for Preventive Care and Fall Prevention Tips.

## 2023-06-30 NOTE — Progress Notes (Signed)
 Subjective:   Nicole Jimenez is a 89 y.o. who presents for a Medicare Wellness preventive visit.  As a reminder, Annual Wellness Visits don't include a physical exam, and some assessments may be limited, especially if this visit is performed virtually. We may recommend an in-person follow-up visit with your provider if needed.  Visit Complete: Virtual I connected with  Nicole Jimenez on 06/30/23 by a audio enabled telemedicine application and verified that I am speaking with the correct person using two identifiers.  Patient Location: Home  Provider Location: Home Office  I discussed the limitations of evaluation and management by telemedicine. The patient expressed understanding and agreed to proceed.  Vital Signs: Because this visit was a virtual/telehealth visit, some criteria may be missing or patient reported. Any vitals not documented were not able to be obtained and vitals that have been documented are patient reported.  VideoDeclined- This patient declined Librarian, academic. Therefore the visit was completed with audio only.  Persons Participating in Visit: Patient.  AWV Questionnaire: No: Patient Medicare AWV questionnaire was not completed prior to this visit.  Cardiac Risk Factors include: advanced age (>53men, >89 women);hypertension;diabetes mellitus;dyslipidemia     Objective:     Today's Vitals   06/30/23 1547  Weight: 138 lb (62.6 kg)  Height: 5\' 1"  (1.549 m)   Body mass index is 26.07 kg/m.     06/30/2023    3:55 PM 06/22/2022   11:58 AM 05/09/2022    3:53 PM 04/13/2022    4:00 PM 06/05/2021   11:21 AM 06/02/2020   11:17 AM 11/20/2018   11:55 AM  Advanced Directives  Does Patient Have a Medical Advance Directive? Yes Yes No Yes Yes Yes Yes  Type of Estate agent of [employer];Living will Healthcare Power of [place];Living will  Healthcare Power of [place] Living will Healthcare Power of [place];Living  will Living will;Healthcare Power of Attorney  Does patient want to make changes to medical advance directive?   No - Patient declined No - Patient declined   No - Patient declined  Copy of Healthcare Power of Attorney in Chart? No - copy requested No - copy requested  No - copy requested  No - copy requested No - copy requested  Would patient like information on creating a medical advance directive?   No - Patient declined No - Patient declined       Current Medications (verified) Outpatient Encounter Medications as of 06/30/2023  Medication Sig   Acetaminophen  500 MG coapsule Take 1,000 mg by mouth every 6 (six) hours as needed for pain.   apixaban  (ELIQUIS ) 2.5 MG TABS tablet Take 1 tablet (2.5 mg total) by mouth 2 (two) times daily.   Calcium  Carb-Cholecalciferol 600-800 MG-UNIT CHEW Chew 1 each by mouth daily.    cycloSPORINE (RESTASIS) 0.05 % ophthalmic emulsion 1 drop 2 (two) times daily.   diclofenac  sodium (VOLTAREN ) 1 % GEL APPLY 2 G TOPICALLY 4 (FOUR) TIMES DAILY AS NEEDED. **PA DENIED** (Patient taking differently: Apply 2 g topically daily as needed (For pain).)   diltiazem  (CARDIZEM  CD) 180 MG 24 hr capsule TAKE 1 CAPSULE (180 MG TOTAL) BY MOUTH EVERY MORNING   Glucosamine-MSM-Hyaluronic Acd (JOINT HEALTH PO) Take 1 tablet by mouth daily.   glucose blood (ONETOUCH ULTRA) test strip USE TWICE DAILY TO CHECK BLOOD SUGAR - E11.40   JARDIANCE  10 MG TABS tablet TAKE 1 TABLET BY MOUTH EVERY DAY   Krill Oil 500 MG CAPS Take 500  mg by mouth daily.   Multiple Vitamins-Minerals (MULTIVITAMIN ADULT PO) Take by mouth.   omeprazole  (PRILOSEC) 20 MG capsule Take 20 mg by mouth daily.   rosuvastatin  (CRESTOR ) 40 MG tablet Take 1 tablet (40 mg total) by mouth daily.   vortioxetine  HBr (TRINTELLIX ) 10 MG TABS tablet Take 1 tablet (10 mg total) by mouth daily.   carvedilol  (COREG ) 12.5 MG tablet Take 1 tablet (12.5 mg total) by mouth 2 (two) times daily.   No facility-administered encounter  medications on file as of 06/30/2023.    Allergies (verified) Amoxicillin-pot clavulanate, Amoxicillin, and Clarithromycin   History: Past Medical History:  Diagnosis Date   Arthritis    Depression    Diabetes mellitus without complication (HCC)    Borderline   Diastolic CHF (HCC) 03/03/2017   Echo 11/2016, nl EF but LVH and diastolic dysfunction   GERD (gastroesophageal reflux disease)    Hyperlipidemia    Hypertension    Peripheral arterial disease (HCC) 2018   thromboembolism of femoral artery   Thyroid  disease    subclinical hyperthyroidism managed by endocrinology   Past Surgical History:  Procedure Laterality Date   FEMORAL ENDARTERECTOMY Right 11/29/2016   Family History  Problem Relation Age of Onset   Heart attack Mother    Arthritis Mother    Diabetes Father    Heart attack Father    Hyperlipidemia Father    Arthritis Sister    Diabetes Sister        adopted   Healthy Daughter    Healthy Son    Healthy Son    Other Son        cyst on thyroid    Social History   Socioeconomic History   Marital status: Widowed    Spouse name: Not on file   Number of children: Not on file   Years of education: Not on file   Highest education level: 12th grade  Occupational History   Occupation: retired aesthician  Tobacco Use   Smoking status: Never   Smokeless tobacco: Never  Vaping Use   Vaping status: Never Used  Substance and Sexual Activity   Alcohol use: No   Drug use: No   Sexual activity: Never  Other Topics Concern   Not on file  Social History Narrative   Not on file   Social Drivers of Health   Financial Resource Strain: Low Risk  (06/30/2023)   Overall Financial Resource Strain (CARDIA)    Difficulty of Paying Living Expenses: Not hard at all  Food Insecurity: No Food Insecurity (06/30/2023)   Hunger Vital Sign    Worried About Running Out of Food in the Last Year: Never true    Ran Out of Food in the Last Year: Never true  Transportation  Needs: No Transportation Needs (06/30/2023)   PRAPARE - Administrator, Civil Service (Medical): No    Lack of Transportation (Non-Medical): No  Physical Activity: Inactive (06/30/2023)   Exercise Vital Sign    Days of Exercise per Week: 0 days    Minutes of Exercise per Session: 0 min  Stress: No Stress Concern Present (06/30/2023)   [employer] of Occupational Health - Occupational Stress Questionnaire    Feeling of Stress : Not at all  Social Connections: Moderately Integrated (06/30/2023)   Social Connection and Isolation Panel [NHANES]    Frequency of Communication with Friends and Family: Three times a week    Frequency of Social Gatherings with Friends and Family: Twice a week  Attends Religious Services: More than 4 times per year    Active Member of Clubs or Organizations: Yes    Attends Banker Meetings: 1 to 4 times per year    Marital Status: Widowed    Tobacco Counseling Counseling given: Not Answered    Clinical Intake:  Pre-visit preparation completed: Yes  Pain : No/denies pain     BMI - recorded: 26.07 Nutritional Status: BMI 25 -29 Overweight Diabetes: Yes CBG done?: No Did pt. bring in CBG monitor from home?: No  Lab Results  Component Value Date   HGBA1C 7.0 (A) 04/21/2023   HGBA1C 6.4 (A) 01/21/2023   HGBA1C 7.3 (H) 11/01/2022     How often do you need to have someone help you when you read instructions, pamphlets, or other written materials from your doctor or pharmacy?: 1 - Never  Interpreter Needed?: No  Information entered by :: Lamont Pilsner, LPN   Activities of Daily Living     06/30/2023    3:48 PM  In your present state of health, do you have any difficulty performing the following activities:  Hearing? 0  Vision? 0  Difficulty concentrating or making decisions? 0  Walking or climbing stairs? 0  Dressing or bathing? 0  Doing errands, shopping? 0  Preparing Food and eating ? N  Using the Toilet?  N  In the past six months, have you accidently leaked urine? N  Do you have problems with loss of bowel control? N  Managing your Medications? N  Managing your Finances? N  Housekeeping or managing your Housekeeping? N    Patient Care Team: Luevenia Saha, MD as PCP - General (Family Medicine) Sheryle Donning, MD as PCP - Cardiology (Cardiology) Eileen Grate, MD as Referring Physician (Ophthalmology) Laurence Pons, MD as Consulting Physician (Endocrinology) Alvino Aye, MD as Referring Physician (Vascular Surgery) Liliane Rei, MD as Consulting Physician (Orthopedic Surgery) Dermatology, [facility] as Consulting Physician  Indicate any recent Medical Services you may have received from other than [facility] providers in the past year (date may be approximate).     Assessment:    This is a routine wellness examination for [place].  Hearing/Vision screen Hearing Screening - Comments:: HOH Vision Screening - Comments:: Wears rx glasses - up to date with routine eye exams with Dr Doyne Genin office in [place]     Goals Addressed             This Visit's Progress    Patient Stated       Maintain health and activity        Depression Screen     06/30/2023    3:53 PM 04/21/2023   11:36 AM 01/21/2023   11:42 AM 12/24/2022   11:38 AM 11/22/2022    1:37 PM 11/01/2022   10:36 AM 06/28/2022   11:24 AM  PHQ 2/9 Scores  PHQ - 2 Score 0 0 0 0 0 0 0  PHQ- 9 Score   0 1       Fall Risk     06/30/2023    3:56 PM 04/21/2023   11:29 AM 01/21/2023   11:42 AM 11/22/2022    1:37 PM 11/01/2022   10:35 AM  Fall Risk   Falls in the past year? 0 0 0 0 0  Number falls in past yr: 0 0 0 0 0  Injury with Fall? 0 0 0 0 0  Risk for fall due to : Impaired balance/gait No Fall Risks No  Fall Risks No Fall Risks No Fall Risks  Follow up Falls evaluation completed Falls evaluation completed Falls evaluation completed Falls evaluation completed Falls evaluation completed     MEDICARE RISK AT HOME:   Medicare Risk at Home Any stairs in or around the home?: Yes If so, are there any without handrails?: No Home free of loose throw rugs in walkways, pet beds, electrical cords, etc?: Yes Adequate lighting in your home to reduce risk of falls?: Yes Life alert?: No Use of a cane, walker or w/c?: No Grab bars in the bathroom?: Yes Shower chair or bench in shower?: No Elevated toilet seat or a handicapped toilet?: No  TIMED UP AND GO:  Was the test performed?  No  Cognitive Function: 6CIT completed    11/20/2018   11:54 AM 07/08/2017    2:40 PM  MMSE - Mini Mental State Exam  Orientation to time 5 5  Orientation to Place 5 5  Registration 3 3  Attention/ Calculation 5 5  Recall 3 1  Language- name 2 objects 2 2  Language- repeat 1 1  Language- follow 3 step command 3 3  Language- read & follow direction 1 1  Write a sentence 1 1  Copy design 1 1  Total score 30 28        06/30/2023    3:57 PM 06/22/2022   11:52 AM 06/05/2021   11:28 AM 06/02/2020   11:22 AM  6CIT Screen  What Year? 0 points 0 points 0 points 0 points  What month? 0 points 0 points 0 points 0 points  What time? 0 points 0 points 0 points   Count back from 20 0 points 0 points 0 points 0 points  Months in reverse 0 points 0 points 0 points 0 points  Repeat phrase 0 points 0 points 2 points 0 points  Total Score 0 points 0 points 2 points     Immunizations Immunization History  Administered Date(s) Administered   Fluad Quad(high Dose 65+) 11/20/2018, 11/28/2020, 10/23/2021   Fluad Trivalent(High Dose 65+) 11/01/2022   Influenza Split 01/24/2012   Influenza, High Dose Seasonal PF 12/02/2013, 01/20/2015, 12/28/2016, 10/26/2019   Influenza,inj,Quad PF,6+ Mos 11/21/2015, 11/18/2017   Influenza-Unspecified 01/24/2012, 01/05/2014, 01/05/2014, 01/20/2015   PFIZER Comirnaty(Gray Top)Covid-19 Tri-Sucrose Vaccine 05/20/2020   PFIZER(Purple Top)SARS-COV-2 Vaccination 03/01/2019,  03/22/2019, 11/16/2019   Pfizer Covid-19 Vaccine Bivalent Booster 95yrs & up 12/16/2020   Pfizer(Comirnaty)Fall Seasonal Vaccine 12 years and older 11/29/2021   Pneumococcal Conjugate-13 03/06/2015, 03/06/2015   Pneumococcal Polysaccharide-23 06/18/2016   Respiratory Syncytial Virus Vaccine,Recomb Aduvanted(Arexvy) 01/17/2023   Zoster Recombinant(Shingrix) 03/09/2018, 08/21/2018   Zoster, Live 10/09/2012    Screening Tests Health Maintenance  Topic Date Due   COVID-19 Vaccine (7 - 2024-25 season) 10/10/2022   INFLUENZA VACCINE  09/09/2023   HEMOGLOBIN A1C  10/22/2023   FOOT EXAM  11/01/2023   OPHTHALMOLOGY EXAM  12/28/2023   Medicare Annual Wellness (AWV)  06/29/2024   DEXA SCAN  11/24/2024   Pneumonia Vaccine 83+ Years old  Completed   Zoster Vaccines- Shingrix  Completed   HPV VACCINES  Aged Out   Meningococcal B Vaccine  Aged Out   DTaP/Tdap/Td  Discontinued    Health Maintenance  Health Maintenance Due  Topic Date Due   COVID-19 Vaccine (7 - 2024-25 season) 10/10/2022   Health Maintenance Items Addressed: See Nurse Notes  Additional Screening:  Vision Screening: Recommended annual ophthalmology exams for early detection of glaucoma and other disorders of the eye.  Dental Screening: Recommended annual dental exams for proper oral hygiene  Community Resource Referral / Chronic Care Management: CRR required this visit?  No   CCM required this visit?  No   Plan:    I have personally reviewed and noted the following in the patient's chart:   Medical and social history Use of alcohol, tobacco or illicit drugs  Current medications and supplements including opioid prescriptions. Patient is not currently taking opioid prescriptions. Functional ability and status Nutritional status Physical activity Advanced directives List of other physicians Hospitalizations, surgeries, and ER visits in previous 12 months Vitals Screenings to include cognitive, depression,  and falls Referrals and appointments  In addition, I have reviewed and discussed with patient certain preventive protocols, quality metrics, and best practice recommendations. A written personalized care plan for preventive services as well as general preventive health recommendations were provided to patient.   Bruno Capri, LPN   1/61/0960   After Visit Summary: (MyChart) Due to this being a telephonic visit, the after visit summary with patients personalized plan was offered to patient via MyChart   Notes: Please refer to Routing Comments.

## 2023-07-05 NOTE — Addendum Note (Signed)
 Addended by: Bruno Capri on: 07/05/2023 07:57 AM   Modules accepted: Orders

## 2023-07-19 ENCOUNTER — Encounter: Payer: Self-pay | Admitting: Family Medicine

## 2023-07-19 ENCOUNTER — Ambulatory Visit (INDEPENDENT_AMBULATORY_CARE_PROVIDER_SITE_OTHER): Admitting: Family Medicine

## 2023-07-19 VITALS — BP 150/75 | HR 65 | Temp 98.2°F | Ht 61.0 in | Wt 138.4 lb

## 2023-07-19 DIAGNOSIS — I5032 Chronic diastolic (congestive) heart failure: Secondary | ICD-10-CM | POA: Diagnosis not present

## 2023-07-19 DIAGNOSIS — I4821 Permanent atrial fibrillation: Secondary | ICD-10-CM

## 2023-07-19 DIAGNOSIS — Z7984 Long term (current) use of oral hypoglycemic drugs: Secondary | ICD-10-CM

## 2023-07-19 DIAGNOSIS — E1142 Type 2 diabetes mellitus with diabetic polyneuropathy: Secondary | ICD-10-CM

## 2023-07-19 DIAGNOSIS — N1832 Chronic kidney disease, stage 3b: Secondary | ICD-10-CM | POA: Diagnosis not present

## 2023-07-19 LAB — POCT GLYCOSYLATED HEMOGLOBIN (HGB A1C): Hemoglobin A1C: 6.5 % — AB (ref 4.0–5.6)

## 2023-07-22 ENCOUNTER — Ambulatory Visit: Admitting: Family Medicine

## 2023-07-27 MED ORDER — EMPAGLIFLOZIN 10 MG PO TABS: 10.0000 mg | ORAL_TABLET | Freq: Every day | ORAL | 3 refills | Status: DC

## 2023-07-27 NOTE — Progress Notes (Signed)
 Subjective  CC:  Chief Complaint  Patient presents with   Diabetes    HPI: Nicole Jimenez is a 89 y.o. female who presents to the office today for follow up of diabetes and problems listed above in the chief complaint.  Diabetes follow up: Her diabetic control is reported as Improved. Doing well on meds.  She denies exertional CP or SOB or symptomatic hypoglycemia. She denies foot sores or paresthesias. Diet is fair Reviewed renal notes; ckd is stable. Could consider increasing dose of jardiance  to 25mg  daily if needed. CHF is stable as well.  Afib on eloquis w/o complications.   Wt Readings from Last 3 Encounters:  07/19/23 138 lb 6.4 oz (62.8 kg)  06/30/23 138 lb (62.6 kg)  06/09/23 138 lb 12.8 oz (63 kg)    BP Readings from Last 3 Encounters:  07/19/23 (!) 150/75  06/09/23 136/70  04/21/23 128/62    Assessment  1. Type 2 diabetes mellitus with peripheral neuropathy (HCC)   2. Chronic kidney disease, stage 3b (HCC)   3. Chronic diastolic CHF (congestive heart failure) (HCC)   4. Permanent atrial fibrillation (HCC)      Plan  Diabetes is currently very well controlled. Remains controlled on jardiance  10. Will monitor w/o increasing dose today. CKD function is stable. Avoid met and glp-1  Chf and chronic afib; stable  Follow up: 3 mo for recheck Orders Placed This Encounter  Procedures   POCT HgB A1C   Meds ordered this encounter  Medications   empagliflozin  (JARDIANCE ) 10 MG TABS tablet    Sig: Take 1 tablet (10 mg total) by mouth daily.    Dispense:  90 tablet    Refill:  3      Immunization History  Administered Date(s) Administered   Fluad Quad(high Dose 65+) 11/20/2018, 11/28/2020, 10/23/2021   Fluad Trivalent(High Dose 65+) 11/01/2022   Influenza Split 01/24/2012   Influenza, High Dose Seasonal PF 12/02/2013, 01/20/2015, 12/28/2016, 10/26/2019   Influenza,inj,Quad PF,6+ Mos 11/21/2015, 11/18/2017   Influenza-Unspecified 01/24/2012, 01/05/2014,  01/05/2014, 01/20/2015   PFIZER Comirnaty(Gray Top)Covid-19 Tri-Sucrose Vaccine 05/20/2020   PFIZER(Purple Top)SARS-COV-2 Vaccination 03/01/2019, 03/22/2019, 11/16/2019   Pfizer Covid-19 Vaccine Bivalent Booster 2yrs & up 12/16/2020   Pfizer(Comirnaty)Fall Seasonal Vaccine 12 years and older 11/29/2021   Pneumococcal Conjugate-13 03/06/2015, 03/06/2015   Pneumococcal Polysaccharide-23 06/18/2016   Respiratory Syncytial Virus Vaccine,Recomb Aduvanted(Arexvy) 01/17/2023   Zoster Recombinant(Shingrix) 03/09/2018, 08/21/2018   Zoster, Live 10/09/2012    Diabetes Related Lab Review: Lab Results  Component Value Date   HGBA1C 6.5 (A) 07/19/2023   HGBA1C 7.0 (A) 04/21/2023   HGBA1C 6.4 (A) 01/21/2023    Lab Results  Component Value Date   MICROALBUR 29.0 (H) 04/21/2023   Lab Results  Component Value Date   CREATININE 1.53 (H) 03/11/2023   BUN 27 (H) 03/11/2023   NA 144 03/11/2023   K 4.1 03/11/2023   CL 105 03/11/2023   CO2 30 03/11/2023   Lab Results  Component Value Date   CHOL 136 11/01/2022   CHOL 176 10/23/2021   CHOL 177 04/17/2021   Lab Results  Component Value Date   HDL 65.70 11/01/2022   HDL 71.50 10/23/2021   HDL 73.10 04/17/2021   Lab Results  Component Value Date   LDLCALC 51 11/01/2022   LDLCALC 84 10/23/2021   LDLCALC 80 04/17/2021   Lab Results  Component Value Date   TRIG 97.0 11/01/2022   TRIG 106.0 10/23/2021   TRIG 122.0 04/17/2021   Lab  Results  Component Value Date   CHOLHDL 2 11/01/2022   CHOLHDL 2 10/23/2021   CHOLHDL 2 04/17/2021   No results found for: LDLDIRECT The ASCVD Risk score (Arnett DK, et al., 2019) failed to calculate for the following reasons:   The 2019 ASCVD risk score is only valid for ages 46 to 66 I have reviewed the PMH, Fam and Soc history. Patient Active Problem List   Diagnosis Date Noted   Chronic kidney disease, stage 3b (HCC) 11/02/2022    Priority: High    Had mild ckd, likely HTN/DM related. Then Had  ATN 04/2022 w/ highest creatinine 4+; never returned completely back to baseline; GFR aroudn 30 is new baseline 10/2022 05/2023: renal consult due to proteinuria: can increase jardiance  to 25, no other changes needed. Low risk of progression but will follow. Dr. Ansel Kingdom    History of arterial embolism 08/21/2018    Priority: High    Managed by vascular. 2018    Anticoagulant long-term use 03/06/2018    Priority: High   Permanent atrial fibrillation (HCC) 01/23/2018    Priority: High   Chronic diastolic CHF (congestive heart failure) (HCC) 03/03/2017    Priority: High    Echo 11/2016, nl EF but LVH and diastolic dysfunction     Type 2 diabetes mellitus with peripheral neuropathy (HCC) 01/24/2017    Priority: High    Had been on metformin ; stopped in march 2024 during hospitalization for AKI on ACE.  H/o pancreatitis: avoiding glp-1 and cautious if sglt2-I started jardiance  10/24    Subclinical hyperthyroidism 01/21/2014    Priority: High    Managed by endocrine    Essential hypertension 02/23/2013    Priority: High    improved on recheck. Previously, lisinopril  and HCTZ stopped 04/2022 during admission for AKI/ATN. HCTZ has been restarted -BP improved with change from atenolol  to carvedilol  -if additional BP control needed at follow up, could change diltiazem  to amlodipine and monitor HR -stressed salt avoidance. She is working on activity     Combined hyperlipidemia associated with type 2 diabetes mellitus (HCC) 02/23/2013    Priority: High   Major depression, recurrent, chronic (HCC) 07/18/2012    Priority: High   Thrombocytopenia (HCC) 11/02/2022    Priority: Medium     First noted March 2024 during hospitalization for AKI and pancreatitis.  Improved posthospitalization.  September 2024, platelets 121.  Unclear cause, needs workup if persist.    Gout 11/01/2022    Priority: Medium    DJD of right shoulder 01/24/2017    Priority: Medium    Bereavement due to life event  03/05/2016    Priority: Medium     Her husband of 56 years died in 02-05-2016    Osteoporosis of femur without pathological fracture 08/23/2015    Priority: Medium     DEXA 11/2022: lowest T -=-2.3 FRAX score: 10 year major osteoporotic risk: 16%. 10 year hip fracture risk: 4.9%. The thresholds for treatment are 20% and 3%, respectively. She will consider fosamax  but defer decision until 2025.  DEXA 11/2020: lowest T =-2.3, left femoral neck, elevated FRAX score Major Osteoporotic Fracture: 29.4% Hip Fracture:                19.1% Recommended fosamax  but pt never started.   dexa 2017: T = -1.0 at femur, elevated FRAX score.  Repeat 2019 Osteopenia with elevated hip frac score of 10%:  T lowest = - 1.8 Major Osteoporotic Fracture: 17.1% Hip Fracture:  10.4% Population:                  [place]  ([place]) Risk Factors:                Family Hist. (Parent hip fracture) Offer medications due to elevated hip fracture risk score.     Idiopathic scoliosis of thoracic spine 07/09/2014    Priority: Medium    Sjogren's syndrome (HCC) 08/04/2012    Priority: Medium    DDD (degenerative disc disease), lumbosacral 07/15/2012    Priority: Medium    Lumbosacral spondylosis 07/15/2012    Priority: Medium    Osteoarthritis of knee 07/15/2012    Priority: Medium    Bilateral edema of lower extremity 01/26/2017    Priority: Low   Bilateral hip bursitis 07/17/2015    Priority: Low   Age-related nuclear cataract of both eyes 03/11/2015    Priority: Low   Insomnia 09/04/2013    Priority: Low   Allergic rhinitis 07/15/2012    Priority: Low    Social History: Patient  reports that she has never smoked. She has never used smokeless tobacco. She reports that she does not drink alcohol and does not use drugs.  Review of Systems: Ophthalmic: negative for eye pain, loss of vision or double vision Cardiovascular: negative for chest pain Respiratory: negative for SOB or persistent  cough Gastrointestinal: negative for abdominal pain Genitourinary: negative for dysuria or gross hematuria MSK: negative for foot lesions Neurologic: negative for weakness or gait disturbance  Objective  Vitals: BP (!) 150/75   Pulse 65   Temp 98.2 F (36.8 C)   Ht 5' 1 (1.549 m)   Wt 138 lb 6.4 oz (62.8 kg)   SpO2 96%   BMI 26.15 kg/m  General: well appearing, no acute distress  Psych:  Alert and oriented, normal mood and affect HEENT:  Normocephalic, atraumatic, moist mucous membranes, supple neck  Cardiovascular:  Nl S1 and S2, RRR without murmur, gallop or rub. no edema Respiratory:  Good breath sounds bilaterally, CTAB with normal effort, no rales  Diabetic education: ongoing education regarding chronic disease management for diabetes was given today. We continue to reinforce the ABC's of diabetic management: A1c (<7 or 8 dependent upon patient), tight blood pressure control, and cholesterol management with goal LDL < 100 minimally. We discuss diet strategies, exercise recommendations, medication options and possible side effects. At each visit, we review recommended immunizations and preventive care recommendations for diabetics and stress that good diabetic control can prevent other problems. See below for this patient's data.   Commons side effects, risks, benefits, and alternatives for medications and treatment plan prescribed today were discussed, and the patient expressed understanding of the given instructions. Patient is instructed to call or message via MyChart if he/she has any questions or concerns regarding our treatment plan. No barriers to understanding were identified. We discussed Red Flag symptoms and signs in detail. Patient expressed understanding regarding what to do in case of urgent or emergency type symptoms.  Medication list was reconciled, printed and provided to the patient in AVS. Patient instructions and summary information was reviewed with the patient as  documented in the AVS. This note was prepared with assistance of Dragon voice recognition software. Occasional wrong-word or sound-a-like substitutions may have occurred due to the inherent limitations of voice recognition software

## 2023-08-23 ENCOUNTER — Encounter: Payer: Self-pay | Admitting: Family Medicine

## 2023-08-23 ENCOUNTER — Ambulatory Visit (INDEPENDENT_AMBULATORY_CARE_PROVIDER_SITE_OTHER): Admitting: Family Medicine

## 2023-08-23 VITALS — BP 164/73 | HR 75 | Temp 97.7°F | Ht 61.0 in | Wt 132.6 lb

## 2023-08-23 DIAGNOSIS — I10 Essential (primary) hypertension: Secondary | ICD-10-CM | POA: Diagnosis not present

## 2023-08-23 DIAGNOSIS — K521 Toxic gastroenteritis and colitis: Secondary | ICD-10-CM

## 2023-08-23 DIAGNOSIS — Z711 Person with feared health complaint in whom no diagnosis is made: Secondary | ICD-10-CM

## 2023-08-23 NOTE — Patient Instructions (Signed)
 Please return in 2 months for recheck  If you have any questions or concerns, please don't hesitate to send me a message via MyChart or call the office at [phone]. Thank you for visiting with us  today! It's our pleasure caring for you.

## 2023-08-23 NOTE — Progress Notes (Signed)
 Subjective  CC:  Chief Complaint  Patient presents with   Abdominal Pain    Pt stated that about 3 weeks ago she had went to a friends house and ate and when she got home later that evening, that's when the pain started along with throwing up. But since then, she has been feeling weak from it all with some lightheadedness     HPI: Nicole Jimenez is a 89 y.o. female who presents to the office today to address the problems listed above in the chief complaint. Discussed the use of AI scribe software for clinical note transcription with the patient, who gave verbal consent to proceed.  History of Present Illness Nicole Jimenez is an 89 year old female with hypertension who presents with concerns about recent gastrointestinal symptoms and blood pressure fluctuations.  Approximately three weeks ago, she attended a luncheon where she consumed chicken salad, potentially containing mayonnaise. Later that day, she experienced intense abdominal pain, nausea, vomiting, and diarrhea. She did not consume additional fluids until symptoms began. The symptoms persisted into the next day with continued nausea and food aversion. She managed to drink water and tea but did not eat until the third day when she started feeling better and could consume crackers and cheese. By the fourth day, she was able to eat a normal meal. She felt weak for about a week following the incident, longer than her usual recovery time from similar past episodes. She feels back to her normal self now.   She has been monitoring her blood pressure with her son-in-law, who records it three times a day. Her blood pressure readings have ranged from 116/68 to 131/68, with the first reading of the day often being higher. She attributes this to being a 'worrier'. She is concerned about the fluctuations in her blood pressure and her overall well-being.    Assessment  No diagnosis found.   Plan  Assessment and Plan Assessment &  Plan  Hypertension Control is good by home readings. Some white coat/anxiety component. - Continue monitoring blood pressure regularly. - Reassured that current blood pressure readings are acceptable. Continue current meds.   Diabetes Mellitus Neuropathy in feet due to diabetes, well-managed with no new symptoms. - Continue current management of diabetes and monitor for any new symptoms.  GE: resolved. Reassured.     F/u 2 mo for diabetes. No orders of the defined types were placed in this encounter.  No orders of the defined types were placed in this encounter.    I reviewed the patients updated PMH, FH, and SocHx.  Patient Active Problem List   Diagnosis Date Noted   Chronic kidney disease, stage 3b (HCC) 11/02/2022    Priority: High   History of arterial embolism 08/21/2018    Priority: High   Anticoagulant long-term use 03/06/2018    Priority: High   Permanent atrial fibrillation (HCC) 01/23/2018    Priority: High   Chronic diastolic CHF (congestive heart failure) (HCC) 03/03/2017    Priority: High   Type 2 diabetes mellitus with peripheral neuropathy (HCC) 01/24/2017    Priority: High   Subclinical hyperthyroidism 01/21/2014    Priority: High   Essential hypertension 02/23/2013    Priority: High   Combined hyperlipidemia associated with type 2 diabetes mellitus (HCC) 02/23/2013    Priority: High   Major depression, recurrent, chronic (HCC) 07/18/2012    Priority: High   Thrombocytopenia (HCC) 11/02/2022    Priority: Medium    Gout 11/01/2022  Priority: Medium    DJD of right shoulder 01/24/2017    Priority: Medium    Bereavement due to life event 03/05/2016    Priority: Medium    Osteoporosis of femur without pathological fracture 08/23/2015    Priority: Medium    Idiopathic scoliosis of thoracic spine 07/09/2014    Priority: Medium    Sjogren's syndrome (HCC) 08/04/2012    Priority: Medium    DDD (degenerative disc disease), lumbosacral 07/15/2012     Priority: Medium    Lumbosacral spondylosis 07/15/2012    Priority: Medium    Osteoarthritis of knee 07/15/2012    Priority: Medium    Bilateral edema of lower extremity 01/26/2017    Priority: Low   Bilateral hip bursitis 07/17/2015    Priority: Low   Age-related nuclear cataract of both eyes 03/11/2015    Priority: Low   Insomnia 09/04/2013    Priority: Low   Allergic rhinitis 07/15/2012    Priority: Low   Current Meds  Medication Sig   Acetaminophen  500 MG coapsule Take 1,000 mg by mouth every 6 (six) hours as needed for pain.   apixaban  (ELIQUIS ) 2.5 MG TABS tablet Take 1 tablet (2.5 mg total) by mouth 2 (two) times daily.   Calcium  Carb-Cholecalciferol 600-800 MG-UNIT CHEW Chew 1 each by mouth daily.    carvedilol  (COREG ) 12.5 MG tablet Take 1 tablet (12.5 mg total) by mouth 2 (two) times daily.   cycloSPORINE (RESTASIS) 0.05 % ophthalmic emulsion 1 drop 2 (two) times daily.   diclofenac  sodium (VOLTAREN ) 1 % GEL APPLY 2 G TOPICALLY 4 (FOUR) TIMES DAILY AS NEEDED. **PA DENIED** (Patient taking differently: Apply 2 g topically daily as needed (For pain).)   diltiazem  (CARDIZEM  CD) 180 MG 24 hr capsule TAKE 1 CAPSULE (180 MG TOTAL) BY MOUTH EVERY MORNING   empagliflozin  (JARDIANCE ) 10 MG TABS tablet Take 1 tablet (10 mg total) by mouth daily.   Glucosamine-MSM-Hyaluronic Acd (JOINT HEALTH PO) Take 1 tablet by mouth daily.   glucose blood (ONETOUCH ULTRA) test strip USE TWICE DAILY TO CHECK BLOOD SUGAR - E11.40   Krill Oil 500 MG CAPS Take 500 mg by mouth daily.   Multiple Vitamins-Minerals (MULTIVITAMIN ADULT PO) Take by mouth.   omeprazole  (PRILOSEC) 20 MG capsule Take 20 mg by mouth daily.   rosuvastatin  (CRESTOR ) 40 MG tablet Take 1 tablet (40 mg total) by mouth daily.   vortioxetine  HBr (TRINTELLIX ) 10 MG TABS tablet Take 1 tablet (10 mg total) by mouth daily.   Allergies: Patient is allergic to amoxicillin-pot clavulanate, amoxicillin, and clarithromycin. Family  History: Patient family history includes Arthritis in her mother and sister; Diabetes in her father and sister; Healthy in her daughter, son, and son; Heart attack in her father and mother; Hyperlipidemia in her father; Other in her son. Social History:  Patient  reports that she has never smoked. She has never used smokeless tobacco. She reports that she does not drink alcohol and does not use drugs.  Review of Systems: Constitutional: Negative for fever malaise or anorexia Cardiovascular: negative for chest pain Respiratory: negative for SOB or persistent cough Gastrointestinal: negative for abdominal pain  Objective  Vitals: BP (!) 164/73   Pulse 75   Temp 97.7 F (36.5 C)   Ht 5' 1 (1.549 m)   Wt 132 lb 9.6 oz (60.1 kg)   SpO2 97%   BMI 25.05 kg/m  General: no acute distress , A&Ox3, appears well. Appears younger than stated age HEENT: PEERL, conjunctiva normal,  neck is supple Cardiovascular:  RRR without murmur or gallop.  Respiratory:  Good breath sounds bilaterally, CTAB with normal respiratory effort Soft nontender abd Skin:  Warm, no rashes Commons side effects, risks, benefits, and alternatives for medications and treatment plan prescribed today were discussed, and the patient expressed understanding of the given instructions. Patient is instructed to call or message via MyChart if he/she has any questions or concerns regarding our treatment plan. No barriers to understanding were identified. We discussed Red Flag symptoms and signs in detail. Patient expressed understanding regarding what to do in case of urgent or emergency type symptoms.  Medication list was reconciled, printed and provided to the patient in AVS. Patient instructions and summary information was reviewed with the patient as documented in the AVS. This note was prepared with assistance of Dragon voice recognition software. Occasional wrong-word or sound-a-like substitutions may have occurred due to the  inherent limitations of voice recognition software

## 2023-09-07 ENCOUNTER — Inpatient Hospital Stay (HOSPITAL_COMMUNITY)

## 2023-09-07 ENCOUNTER — Inpatient Hospital Stay (HOSPITAL_BASED_OUTPATIENT_CLINIC_OR_DEPARTMENT_OTHER)
Admission: EM | Admit: 2023-09-07 | Discharge: 2023-09-12 | DRG: 417 | Disposition: A | Discharge status: Home Health | Attending: Family Medicine | Admitting: Family Medicine

## 2023-09-07 ENCOUNTER — Other Ambulatory Visit: Payer: Self-pay

## 2023-09-07 ENCOUNTER — Emergency Department (HOSPITAL_BASED_OUTPATIENT_CLINIC_OR_DEPARTMENT_OTHER)

## 2023-09-07 ENCOUNTER — Encounter (HOSPITAL_COMMUNITY): Payer: Self-pay | Admitting: Family Medicine

## 2023-09-07 DIAGNOSIS — N1832 Chronic kidney disease, stage 3b: Secondary | ICD-10-CM | POA: Diagnosis present

## 2023-09-07 DIAGNOSIS — E1142 Type 2 diabetes mellitus with diabetic polyneuropathy: Secondary | ICD-10-CM | POA: Diagnosis not present

## 2023-09-07 DIAGNOSIS — I4821 Permanent atrial fibrillation: Secondary | ICD-10-CM | POA: Diagnosis not present

## 2023-09-07 DIAGNOSIS — E785 Hyperlipidemia, unspecified: Secondary | ICD-10-CM | POA: Diagnosis not present

## 2023-09-07 DIAGNOSIS — K3189 Other diseases of stomach and duodenum: Secondary | ICD-10-CM | POA: Diagnosis present

## 2023-09-07 DIAGNOSIS — Z880 Allergy status to penicillin: Secondary | ICD-10-CM

## 2023-09-07 DIAGNOSIS — E039 Hypothyroidism, unspecified: Secondary | ICD-10-CM | POA: Diagnosis not present

## 2023-09-07 DIAGNOSIS — I10 Essential (primary) hypertension: Secondary | ICD-10-CM | POA: Diagnosis not present

## 2023-09-07 DIAGNOSIS — R7989 Other specified abnormal findings of blood chemistry: Secondary | ICD-10-CM

## 2023-09-07 DIAGNOSIS — I5032 Chronic diastolic (congestive) heart failure: Secondary | ICD-10-CM | POA: Diagnosis present

## 2023-09-07 DIAGNOSIS — Z79899 Other long term (current) drug therapy: Secondary | ICD-10-CM

## 2023-09-07 DIAGNOSIS — R748 Abnormal levels of other serum enzymes: Principal | ICD-10-CM

## 2023-09-07 DIAGNOSIS — Z83438 Family history of other disorder of lipoprotein metabolism and other lipidemia: Secondary | ICD-10-CM | POA: Diagnosis not present

## 2023-09-07 DIAGNOSIS — K828 Other specified diseases of gallbladder: Secondary | ICD-10-CM | POA: Diagnosis not present

## 2023-09-07 DIAGNOSIS — E1151 Type 2 diabetes mellitus with diabetic peripheral angiopathy without gangrene: Secondary | ICD-10-CM | POA: Diagnosis present

## 2023-09-07 DIAGNOSIS — R932 Abnormal findings on diagnostic imaging of liver and biliary tract: Secondary | ICD-10-CM | POA: Diagnosis not present

## 2023-09-07 DIAGNOSIS — D696 Thrombocytopenia, unspecified: Secondary | ICD-10-CM | POA: Diagnosis present

## 2023-09-07 DIAGNOSIS — Z833 Family history of diabetes mellitus: Secondary | ICD-10-CM

## 2023-09-07 DIAGNOSIS — Z8249 Family history of ischemic heart disease and other diseases of the circulatory system: Secondary | ICD-10-CM | POA: Diagnosis not present

## 2023-09-07 DIAGNOSIS — R1011 Right upper quadrant pain: Secondary | ICD-10-CM | POA: Diagnosis not present

## 2023-09-07 DIAGNOSIS — E1122 Type 2 diabetes mellitus with diabetic chronic kidney disease: Secondary | ICD-10-CM | POA: Diagnosis present

## 2023-09-07 DIAGNOSIS — K810 Acute cholecystitis: Secondary | ICD-10-CM | POA: Diagnosis not present

## 2023-09-07 DIAGNOSIS — D259 Leiomyoma of uterus, unspecified: Secondary | ICD-10-CM | POA: Diagnosis not present

## 2023-09-07 DIAGNOSIS — K8070 Calculus of gallbladder and bile duct without cholecystitis without obstruction: Secondary | ICD-10-CM | POA: Diagnosis not present

## 2023-09-07 DIAGNOSIS — K851 Biliary acute pancreatitis without necrosis or infection: Secondary | ICD-10-CM | POA: Diagnosis not present

## 2023-09-07 DIAGNOSIS — K8020 Calculus of gallbladder without cholecystitis without obstruction: Secondary | ICD-10-CM | POA: Diagnosis not present

## 2023-09-07 DIAGNOSIS — Z86718 Personal history of other venous thrombosis and embolism: Secondary | ICD-10-CM | POA: Diagnosis not present

## 2023-09-07 DIAGNOSIS — Z881 Allergy status to other antibiotic agents status: Secondary | ICD-10-CM | POA: Diagnosis not present

## 2023-09-07 DIAGNOSIS — Z8261 Family history of arthritis: Secondary | ICD-10-CM | POA: Diagnosis not present

## 2023-09-07 DIAGNOSIS — K8050 Calculus of bile duct without cholangitis or cholecystitis without obstruction: Secondary | ICD-10-CM | POA: Diagnosis not present

## 2023-09-07 DIAGNOSIS — K8042 Calculus of bile duct with acute cholecystitis without obstruction: Secondary | ICD-10-CM | POA: Diagnosis not present

## 2023-09-07 DIAGNOSIS — Z7984 Long term (current) use of oral hypoglycemic drugs: Secondary | ICD-10-CM

## 2023-09-07 DIAGNOSIS — E876 Hypokalemia: Secondary | ICD-10-CM | POA: Diagnosis not present

## 2023-09-07 DIAGNOSIS — K8066 Calculus of gallbladder and bile duct with acute and chronic cholecystitis without obstruction: Secondary | ICD-10-CM | POA: Diagnosis not present

## 2023-09-07 DIAGNOSIS — K8063 Calculus of gallbladder and bile duct with acute cholecystitis with obstruction: Secondary | ICD-10-CM | POA: Diagnosis not present

## 2023-09-07 DIAGNOSIS — K831 Obstruction of bile duct: Secondary | ICD-10-CM

## 2023-09-07 DIAGNOSIS — K219 Gastro-esophageal reflux disease without esophagitis: Secondary | ICD-10-CM | POA: Diagnosis present

## 2023-09-07 DIAGNOSIS — Z7901 Long term (current) use of anticoagulants: Secondary | ICD-10-CM

## 2023-09-07 DIAGNOSIS — R7401 Elevation of levels of liver transaminase levels: Secondary | ICD-10-CM | POA: Diagnosis not present

## 2023-09-07 DIAGNOSIS — R109 Unspecified abdominal pain: Secondary | ICD-10-CM | POA: Diagnosis not present

## 2023-09-07 DIAGNOSIS — I130 Hypertensive heart and chronic kidney disease with heart failure and stage 1 through stage 4 chronic kidney disease, or unspecified chronic kidney disease: Secondary | ICD-10-CM | POA: Diagnosis present

## 2023-09-07 DIAGNOSIS — K838 Other specified diseases of biliary tract: Secondary | ICD-10-CM | POA: Diagnosis not present

## 2023-09-07 DIAGNOSIS — K2970 Gastritis, unspecified, without bleeding: Secondary | ICD-10-CM

## 2023-09-07 DIAGNOSIS — N281 Cyst of kidney, acquired: Secondary | ICD-10-CM | POA: Diagnosis not present

## 2023-09-07 DIAGNOSIS — K859 Acute pancreatitis without necrosis or infection, unspecified: Secondary | ICD-10-CM | POA: Diagnosis not present

## 2023-09-07 DIAGNOSIS — K7689 Other specified diseases of liver: Secondary | ICD-10-CM | POA: Diagnosis not present

## 2023-09-07 DIAGNOSIS — K8012 Calculus of gallbladder with acute and chronic cholecystitis without obstruction: Secondary | ICD-10-CM | POA: Diagnosis not present

## 2023-09-07 LAB — COMPREHENSIVE METABOLIC PANEL WITH GFR
ALT: 56 U/L — ABNORMAL HIGH (ref 0–44)
AST: 146 U/L — ABNORMAL HIGH (ref 15–41)
Albumin: 4.2 g/dL (ref 3.5–5.0)
Alkaline Phosphatase: 167 U/L — ABNORMAL HIGH (ref 38–126)
Anion gap: 14 (ref 5–15)
BUN: 34 mg/dL — ABNORMAL HIGH (ref 8–23)
CO2: 23 mmol/L (ref 22–32)
Calcium: 9.4 mg/dL (ref 8.9–10.3)
Chloride: 101 mmol/L (ref 98–111)
Creatinine, Ser: 1.46 mg/dL — ABNORMAL HIGH (ref 0.44–1.00)
GFR, Estimated: 35 mL/min — ABNORMAL LOW (ref >60)
Glucose, Bld: 169 mg/dL — ABNORMAL HIGH (ref 70–99)
Potassium: 3.5 mmol/L (ref 3.5–5.1)
Sodium: 138 mmol/L (ref 135–145)
Total Bilirubin: 1.4 mg/dL — ABNORMAL HIGH (ref 0.0–1.2)
Total Protein: 7.9 g/dL (ref 6.5–8.1)

## 2023-09-07 LAB — CBC WITH DIFFERENTIAL/PLATELET
Abs Granulocyte: 6.2 K/uL (ref 1.5–6.5)
Abs Immature Granulocytes: 0.02 K/uL (ref 0.00–0.07)
Abs Immature Granulocytes: 0.03 K/uL (ref 0.00–0.07)
Basophils Absolute: 0 K/uL (ref 0.0–0.1)
Basophils Absolute: 0 K/uL (ref 0.0–0.1)
Basophils Relative: 0 %
Basophils Relative: 0 %
Eosinophils Absolute: 0 K/uL (ref 0.0–0.5)
Eosinophils Absolute: 0.2 K/uL (ref 0.0–0.5)
Eosinophils Relative: 0 %
Eosinophils Relative: 2 %
HCT: 39.2 % (ref 36.0–46.0)
HCT: 40.9 % (ref 36.0–46.0)
Hemoglobin: 12.3 g/dL (ref 12.0–15.0)
Hemoglobin: 13.2 g/dL (ref 12.0–15.0)
Immature Granulocytes: 0 %
Immature Granulocytes: 0 %
Lymphocytes Relative: 11 %
Lymphocytes Relative: 4 %
Lymphs Abs: 0.3 K/uL — ABNORMAL LOW (ref 0.7–4.0)
Lymphs Abs: 1 K/uL (ref 0.7–4.0)
MCH: 28.1 pg (ref 26.0–34.0)
MCH: 28.6 pg (ref 26.0–34.0)
MCHC: 31.4 g/dL (ref 30.0–36.0)
MCHC: 32.3 g/dL (ref 30.0–36.0)
MCV: 88.7 fL (ref 80.0–100.0)
MCV: 89.5 fL (ref 80.0–100.0)
Monocytes Absolute: 0.4 K/uL (ref 0.1–1.0)
Monocytes Absolute: 0.6 K/uL (ref 0.1–1.0)
Monocytes Relative: 6 %
Monocytes Relative: 7 %
Neutro Abs: 6.2 K/uL (ref 1.7–7.7)
Neutro Abs: 6.9 K/uL (ref 1.7–7.7)
Neutrophils Relative %: 80 %
Neutrophils Relative %: 90 %
Platelets: 102 K/uL — ABNORMAL LOW (ref 150–400)
Platelets: 133 K/uL — ABNORMAL LOW (ref 150–400)
RBC: 4.38 MIL/uL (ref 3.87–5.11)
RBC: 4.61 MIL/uL (ref 3.87–5.11)
RDW: 13.3 % (ref 11.5–15.5)
RDW: 13.5 % (ref 11.5–15.5)
WBC: 6.9 K/uL (ref 4.0–10.5)
WBC: 8.7 K/uL (ref 4.0–10.5)
nRBC: 0 % (ref 0.0–0.2)
nRBC: 0 % (ref 0.0–0.2)

## 2023-09-07 LAB — URINALYSIS, ROUTINE W REFLEX MICROSCOPIC
Bilirubin Urine: NEGATIVE
Glucose, UA: >1000 mg/dL — AB
Hgb urine dipstick: NEGATIVE
Ketones, ur: NEGATIVE mg/dL
Leukocytes,Ua: NEGATIVE
Nitrite: NEGATIVE
Specific Gravity, Urine: 1.009 (ref 1.005–1.030)
pH: 8 (ref 5.0–8.0)

## 2023-09-07 LAB — LIPASE, BLOOD: Lipase: >2800 U/L — ABNORMAL HIGH (ref 11–51)

## 2023-09-07 LAB — APTT
aPTT: 38 s — ABNORMAL HIGH (ref 24–36)
aPTT: >200 s (ref 24–36)

## 2023-09-07 LAB — HEPATIC FUNCTION PANEL
ALT: 831 U/L — ABNORMAL HIGH (ref 0–44)
AST: 1971 U/L — ABNORMAL HIGH (ref 15–41)
Albumin: 3.3 g/dL — ABNORMAL LOW (ref 3.5–5.0)
Alkaline Phosphatase: 175 U/L — ABNORMAL HIGH (ref 38–126)
Bilirubin, Direct: 1.6 mg/dL — ABNORMAL HIGH (ref 0.0–0.2)
Indirect Bilirubin: 1 mg/dL — ABNORMAL HIGH (ref 0.3–0.9)
Total Bilirubin: 2.6 mg/dL — ABNORMAL HIGH (ref 0.0–1.2)
Total Protein: 7.3 g/dL (ref 6.5–8.1)

## 2023-09-07 LAB — BASIC METABOLIC PANEL WITH GFR
Anion gap: 8 (ref 5–15)
BUN: 29 mg/dL — ABNORMAL HIGH (ref 8–23)
CO2: 24 mmol/L (ref 22–32)
Calcium: 8.8 mg/dL — ABNORMAL LOW (ref 8.9–10.3)
Chloride: 104 mmol/L (ref 98–111)
Creatinine, Ser: 1.18 mg/dL — ABNORMAL HIGH (ref 0.44–1.00)
GFR, Estimated: 45 mL/min — ABNORMAL LOW (ref >60)
Glucose, Bld: 137 mg/dL — ABNORMAL HIGH (ref 70–99)
Potassium: 3.3 mmol/L — ABNORMAL LOW (ref 3.5–5.1)
Sodium: 136 mmol/L (ref 135–145)

## 2023-09-07 LAB — TRIGLYCERIDES: Triglycerides: 38 mg/dL (ref <150)

## 2023-09-07 LAB — LACTIC ACID, PLASMA: Lactic Acid, Venous: 1.4 mmol/L (ref 0.5–1.9)

## 2023-09-07 LAB — GLUCOSE, CAPILLARY
Glucose-Capillary: 130 mg/dL — ABNORMAL HIGH (ref 70–99)
Glucose-Capillary: 157 mg/dL — ABNORMAL HIGH (ref 70–99)
Glucose-Capillary: 116 mg/dL — ABNORMAL HIGH (ref 70–99)
Glucose-Capillary: 101 mg/dL — ABNORMAL HIGH (ref 70–99)

## 2023-09-07 LAB — HEPARIN LEVEL (UNFRACTIONATED): Heparin Unfractionated: >1.1 [IU]/mL — ABNORMAL HIGH (ref 0.30–0.70)

## 2023-09-07 MED ORDER — FENTANYL CITRATE PF 50 MCG/ML IJ SOSY
50.0000 ug | PREFILLED_SYRINGE | Freq: Once | INTRAMUSCULAR | Status: AC
  Administered 2023-09-07: 50 ug via INTRAVENOUS
  Filled 2023-09-07: qty 1

## 2023-09-07 MED ORDER — HEPARIN (PORCINE) 25000 UT/250ML-% IV SOLN
650.0000 [IU]/h | INTRAVENOUS | Status: DC
  Administered 2023-09-07: 650 [IU]/h via INTRAVENOUS

## 2023-09-07 MED ORDER — DILTIAZEM HCL ER COATED BEADS 180 MG PO CP24
180.0000 mg | ORAL_CAPSULE | Freq: Every morning | ORAL | Status: DC
  Administered 2023-09-07 – 2023-09-12 (×5): 180 mg via ORAL
  Filled 2023-09-07 (×5): qty 1

## 2023-09-07 MED ORDER — CARVEDILOL 12.5 MG PO TABS
12.5000 mg | ORAL_TABLET | Freq: Two times a day (BID) | ORAL | Status: DC
  Administered 2023-09-07 – 2023-09-12 (×8): 12.5 mg via ORAL
  Filled 2023-09-07 (×10): qty 1

## 2023-09-07 MED ORDER — LACTATED RINGERS IV SOLN: INTRAVENOUS | Status: AC

## 2023-09-07 MED ORDER — SODIUM CHLORIDE 0.9 % IV BOLUS
1000.0000 mL | Freq: Once | INTRAVENOUS | Status: AC
  Administered 2023-09-07: 1000 mL via INTRAVENOUS

## 2023-09-07 MED ORDER — ROSUVASTATIN CALCIUM 20 MG PO TABS: 40.0000 mg | ORAL_TABLET | Freq: Every day | ORAL | Status: DC

## 2023-09-07 MED ORDER — FENTANYL CITRATE PF 50 MCG/ML IJ SOSY: 50.0000 ug | PREFILLED_SYRINGE | INTRAMUSCULAR | Status: DC | PRN

## 2023-09-07 MED ORDER — HYDRALAZINE HCL 20 MG/ML IJ SOLN: 10.0000 mg | Freq: Four times a day (QID) | INTRAMUSCULAR | Status: DC | PRN

## 2023-09-07 MED ORDER — LACTATED RINGERS IV BOLUS
1000.0000 mL | Freq: Once | INTRAVENOUS | Status: AC
  Administered 2023-09-07: 1000 mL via INTRAVENOUS

## 2023-09-07 MED ORDER — HEPARIN (PORCINE) 25000 UT/250ML-% IV SOLN
850.0000 [IU]/h | INTRAVENOUS | Status: DC
  Administered 2023-09-07: 850 [IU]/h via INTRAVENOUS
  Filled 2023-09-07: qty 250

## 2023-09-07 MED ORDER — INSULIN ASPART 100 UNIT/ML IJ SOLN
0.0000 [IU] | INTRAMUSCULAR | Status: DC
  Administered 2023-09-07: 1 [IU] via SUBCUTANEOUS
  Administered 2023-09-08: 2 [IU] via SUBCUTANEOUS
  Administered 2023-09-09 – 2023-09-10 (×3): 1 [IU] via SUBCUTANEOUS
  Administered 2023-09-11 (×2): 3 [IU] via SUBCUTANEOUS
  Administered 2023-09-12: 2 [IU] via SUBCUTANEOUS
  Administered 2023-09-12: 4 [IU] via SUBCUTANEOUS

## 2023-09-07 MED ORDER — ONDANSETRON HCL 4 MG/2ML IJ SOLN
4.0000 mg | Freq: Once | INTRAMUSCULAR | Status: AC
  Administered 2023-09-07: 4 mg via INTRAVENOUS
  Filled 2023-09-07: qty 2

## 2023-09-07 NOTE — Consult Note (Signed)
 Consult Note  CATELIN MANTHE 11/03/1934  [id number].    Requesting MD: Fredia Skeeter, MD Chief Complaint/Reason for Consult: Biliary pancreatitis  HPI:  Patient is an 89 year old female with PMH significant for HTN, HLD, Chronic diastolic CHF, Atrial fibrillation on eliquis  two times daily, CKD stage III, T2DM, GERD, PAD and hypothyroidism who presented to the ED earlier this morning with severe abdominal pain. Patient provides history and is accompanied by her son. Pain started last night and radiates to her back and across her upper abdomen. Associated nausea and vomiting. Denies pain currently.  She reports a similar episode of pain last month that resolved on its own. Patient last had a bowel movement yesterday. Denies hematochezia. Has not had flatulence today. Denies fevers.   Denies significant alcohol use. Denies illicit drug use. Denies history or current use of tobacco products. Allergies include Amoxicillin and clarithromycin.   Found to have acute pancreatitis, presumed to be secondary to gallstones and was admitted to the hosptalist service with GI and general surgery consulted. No prior abdominal surgery.  ROS: Negative other than HPI  Family History  Problem Relation Age of Onset   Heart attack Mother    Arthritis Mother    Diabetes Father    Heart attack Father    Hyperlipidemia Father    Arthritis Sister    Diabetes Sister        adopted   Healthy Daughter    Healthy Son    Healthy Son    Other Son        cyst on thyroid     Past Medical History:  Diagnosis Date   Arthritis    Depression    Diabetes mellitus without complication (HCC)    Borderline   Diastolic CHF (HCC) 03/03/2017   Echo 11/2016, nl EF but LVH and diastolic dysfunction   GERD (gastroesophageal reflux disease)    Hyperlipidemia    Hypertension    Peripheral arterial disease (HCC) 2018   thromboembolism of femoral artery   Thyroid  disease    subclinical hyperthyroidism managed  by endocrinology    Past Surgical History:  Procedure Laterality Date   FEMORAL ENDARTERECTOMY Right 11/29/2016    Social History:  reports that she has never smoked. She has never used smokeless tobacco. She reports that she does not drink alcohol and does not use drugs.  Allergies:  Allergies  Allergen Reactions   Amoxicillin-Pot Clavulanate Other (See Comments)    unknown   Amoxicillin Other (See Comments)    unknown   Clarithromycin Other (See Comments)    unknown    Medications Prior to Admission  Medication Sig Dispense Refill   apixaban  (ELIQUIS ) 2.5 MG TABS tablet Take 1 tablet (2.5 mg total) by mouth 2 (two) times daily. 180 tablet 3   Calcium  Carb-Cholecalciferol 600-800 MG-UNIT CHEW Chew 2 each by mouth daily.     carvedilol  (COREG ) 12.5 MG tablet Take 1 tablet (12.5 mg total) by mouth 2 (two) times daily. 180 tablet 3   cycloSPORINE (RESTASIS) 0.05 % ophthalmic emulsion Place 1 drop into both eyes 2 (two) times daily.     diclofenac  sodium (VOLTAREN ) 1 % GEL APPLY 2 G TOPICALLY 4 (FOUR) TIMES DAILY AS NEEDED. **PA DENIED** (Patient taking differently: Apply 1 Application topically daily as needed (For pain).) 200 g 1   diltiazem  (CARDIZEM  CD) 180 MG 24 hr capsule TAKE 1 CAPSULE (180 MG TOTAL) BY MOUTH EVERY MORNING 90 capsule 3   empagliflozin  (JARDIANCE )  10 MG TABS tablet Take 1 tablet (10 mg total) by mouth daily. 90 tablet 3   Glucosamine-MSM-Hyaluronic Acd (JOINT HEALTH PO) Take 2 tablets by mouth daily.     Krill Oil 500 MG CAPS Take 500 mg by mouth daily.     Multiple Vitamins-Minerals (MULTIVITAMIN ADULT PO) Take 1 tablet by mouth daily.     omeprazole  (PRILOSEC) 20 MG capsule Take 20 mg by mouth daily.     rosuvastatin  (CRESTOR ) 40 MG tablet Take 1 tablet (40 mg total) by mouth daily. 90 tablet 3   vortioxetine  HBr (TRINTELLIX ) 10 MG TABS tablet Take 1 tablet (10 mg total) by mouth daily. 90 tablet 3   glucose blood (ONETOUCH ULTRA) test strip USE TWICE DAILY  TO CHECK BLOOD SUGAR - E11.40 100 strip 5    Blood pressure (!) 110/55, pulse (!) 101, temperature 98.9 F (37.2 C), temperature source Oral, resp. rate 16, height 5' 1 (1.549 m), weight 58.1 kg, SpO2 92%. Physical Exam:  General: Pleasant, female who is laying in bed in NAD. HEENT: Sclera are noninjected. Conjunctiva anicteric.  Lungs: Respiratory effort nonlabored Abd: Soft, NT, ND. No guarding or rebound tenderness. Negative Murphy's sign Psych: A&Ox3 with an appropriate affect.   Results for orders placed or performed during the hospital encounter of 09/07/23 (from the past 48 hours)  Lipase, blood     Status: Abnormal   Collection Time: 09/07/23 12:29 AM  Result Value Ref Range   Lipase >2,800 (H) 11 - 51 U/L    Comment: Performed at [facility], [address] [place], [place] [address]  Comprehensive metabolic panel     Status: Abnormal   Collection Time: 09/07/23 12:29 AM  Result Value Ref Range   Sodium 138 135 - 145 mmol/L   Potassium 3.5 3.5 - 5.1 mmol/L   Chloride 101 98 - 111 mmol/L   CO2 23 22 - 32 mmol/L   Glucose, Bld 169 (H) 70 - 99 mg/dL    Comment: Glucose reference range applies only to samples taken after fasting for at least 8 hours.   BUN 34 (H) 8 - 23 mg/dL   Creatinine, Ser 8.53 (H) 0.44 - 1.00 mg/dL   Calcium  9.4 8.9 - 10.3 mg/dL   Total Protein 7.9 6.5 - 8.1 g/dL   Albumin 4.2 3.5 - 5.0 g/dL   AST 853 (H) 15 - 41 U/L   ALT 56 (H) 0 - 44 U/L   Alkaline Phosphatase 167 (H) 38 - 126 U/L   Total Bilirubin 1.4 (H) 0.0 - 1.2 mg/dL   GFR, Estimated 35 (L) >60 mL/min    Comment: (NOTE) Calculated using the CKD-EPI Creatinine Equation (2021)    Anion gap 14 5 - 15    Comment: Performed at [facility], [address], [place], [place] [address]  CBC with Differential     Status: Abnormal   Collection Time: 09/07/23 12:29 AM  Result Value Ref Range   WBC 8.7 4.0 - 10.5 K/uL   RBC 4.61 3.87 - 5.11 MIL/uL    Hemoglobin 13.2 12.0 - 15.0 g/dL   HCT 59.0 63.9 - 53.9 %   MCV 88.7 80.0 - 100.0 fL   MCH 28.6 26.0 - 34.0 pg   MCHC 32.3 30.0 - 36.0 g/dL   RDW 86.4 88.4 - 84.4 %   Platelets 133 (L) 150 - 400 K/uL   nRBC 0.0 0.0 - 0.2 %   Neutrophils Relative % 80 %   Neutro Abs  6.9 1.7 - 7.7 K/uL   Lymphocytes Relative 11 %   Lymphs Abs 1.0 0.7 - 4.0 K/uL   Monocytes Relative 7 %   Monocytes Absolute 0.6 0.1 - 1.0 K/uL   Eosinophils Relative 2 %   Eosinophils Absolute 0.2 0.0 - 0.5 K/uL   Basophils Relative 0 %   Basophils Absolute 0.0 0.0 - 0.1 K/uL   Immature Granulocytes 0 %   Abs Immature Granulocytes 0.02 0.00 - 0.07 K/uL    Comment: Performed at [facility], [address], [place], [place] [address]  Urinalysis, Routine w reflex microscopic -Urine, Clean Catch     Status: Abnormal   Collection Time: 09/07/23  3:14 AM  Result Value Ref Range   Color, Urine COLORLESS (A) YELLOW   APPearance CLEAR CLEAR   Specific Gravity, Urine 1.009 1.005 - 1.030   pH 8.0 5.0 - 8.0   Glucose, UA >1,000 (A) NEGATIVE mg/dL   Hgb urine dipstick NEGATIVE NEGATIVE   Bilirubin Urine NEGATIVE NEGATIVE   Ketones, ur NEGATIVE NEGATIVE mg/dL   Protein, ur TRACE (A) NEGATIVE mg/dL   Nitrite NEGATIVE NEGATIVE   Leukocytes,Ua NEGATIVE NEGATIVE   RBC / HPF 0-5 0 - 5 RBC/hpf   WBC, UA 0-5 0 - 5 WBC/hpf   Bacteria, UA RARE (A) NONE SEEN   Squamous Epithelial / HPF 0-5 0 - 5 /HPF    Comment: Performed at [facility], [address] [place], [place] [address]  Basic metabolic panel     Status: Abnormal   Collection Time: 09/07/23  7:34 AM  Result Value Ref Range   Sodium 136 135 - 145 mmol/L   Potassium 3.3 (L) 3.5 - 5.1 mmol/L   Chloride 104 98 - 111 mmol/L   CO2 24 22 - 32 mmol/L   Glucose, Bld 137 (H) 70 - 99 mg/dL    Comment: Glucose reference range applies only to samples taken after fasting for at least 8 hours.   BUN 29 (H) 8 - 23 mg/dL   Creatinine, Ser 8.81  (H) 0.44 - 1.00 mg/dL   Calcium  8.8 (L) 8.9 - 10.3 mg/dL   GFR, Estimated 45 (L) >60 mL/min    Comment: (NOTE) Calculated using the CKD-EPI Creatinine Equation (2021)    Anion gap 8 5 - 15    Comment: Performed at [facility], [address], [place], [place] [address]  Hepatic function panel     Status: Abnormal   Collection Time: 09/07/23  7:34 AM  Result Value Ref Range   Total Protein 7.3 6.5 - 8.1 g/dL   Albumin 3.3 (L) 3.5 - 5.0 g/dL   AST 8,028 (H) 15 - 41 U/L   ALT 831 (H) 0 - 44 U/L   Alkaline Phosphatase 175 (H) 38 - 126 U/L   Total Bilirubin 2.6 (H) 0.0 - 1.2 mg/dL   Bilirubin, Direct 1.6 (H) 0.0 - 0.2 mg/dL   Indirect Bilirubin 1.0 (H) 0.3 - 0.9 mg/dL    Comment: Performed at [facility], [address] [place], [place] [address]  CBC with Differential/Platelet     Status: Abnormal   Collection Time: 09/07/23  7:34 AM  Result Value Ref Range   WBC 6.9 4.0 - 10.5 K/uL   RBC 4.38 3.87 - 5.11 MIL/uL   Hemoglobin 12.3 12.0 - 15.0 g/dL   HCT 60.7 63.9 - 53.9 %   MCV 89.5 80.0 - 100.0 fL   MCH 28.1 26.0 - 34.0 pg  MCHC 31.4 30.0 - 36.0 g/dL   RDW 86.6 88.4 - 84.4 %   Platelets 102 (L) 150 - 400 K/uL   nRBC 0.0 0.0 - 0.2 %   Neutrophils Relative % 90 %   Neutro Abs 6.2 1.7 - 7.7 K/uL   Lymphocytes Relative 4 %   Lymphs Abs 0.3 (L) 0.7 - 4.0 K/uL   Monocytes Relative 6 %   Monocytes Absolute 0.4 0.1 - 1.0 K/uL   Eosinophils Relative 0 %   Eosinophils Absolute 0.0 0.0 - 0.5 K/uL   Basophils Relative 0 %   Basophils Absolute 0.0 0.0 - 0.1 K/uL   Immature Granulocytes 0 %   Abs Immature Granulocytes 0.03 0.00 - 0.07 K/uL   Abs Granulocyte 6.2 1.5 - 6.5 K/uL    Comment: Performed at [facility], [address], [place], [place] [address]  Triglycerides     Status: None   Collection Time: 09/07/23  7:34 AM  Result Value Ref Range   Triglycerides 38 <150 mg/dL    Comment: Performed at [facility], [address] [place], [place] [address]  Heparin  level (unfractionated)     Status: Abnormal   Collection Time: 09/07/23  7:34 AM  Result Value Ref Range   Heparin  Unfractionated >1.10 (H) 0.30 - 0.70 IU/mL    Comment: (NOTE) The clinical reportable range upper limit is being lowered to >1.10 to align with the FDA approved guidance for the current laboratory assay.  If heparin  results are below expected values, and patient dosage has  been confirmed, suggest follow up testing of antithrombin III levels. Performed at [facility], [address], [place], [place] [address]   APTT     Status: Abnormal   Collection Time: 09/07/23  7:34 AM  Result Value Ref Range   aPTT 38 (H) 24 - 36 seconds    Comment:        IF BASELINE aPTT IS ELEVATED, SUGGEST PATIENT RISK ASSESSMENT BE USED TO DETERMINE APPROPRIATE ANTICOAGULANT THERAPY. Performed at [facility], [address] [place], [place] [address]   Glucose, capillary     Status: Abnormal   Collection Time: 09/07/23  7:45 AM  Result Value Ref Range   Glucose-Capillary 130 (H) 70 - 99 mg/dL    Comment: Glucose reference range applies only to samples taken after fasting for at least 8 hours.   US  Abdomen Limited RUQ (LIVER/GB) Result Date: 09/07/2023 CLINICAL DATA:  89 year old female with abdominal pain. Cholelithiasis on CT Abdomen and Pelvis today. EXAM: ULTRASOUND ABDOMEN LIMITED RIGHT UPPER QUADRANT COMPARISON:  Noncontrast CT Abdomen and Pelvis 0036 hours. Previous CT Abdomen and Pelvis 04/14/2022 FINDINGS: Gallbladder: Abnormal gallbladder wall thickening and heterogeneity up to 10 mm on image 7. Incompletely distended gallbladder. Evidence of small nonshadowing echogenic stones (images 8 and 13). Individual stone size estimated up to 8 mm. However, no sonographic Murphy sign elicited. Common bile duct: Diameter: Dilated up to 8 mm, concordant with CT today (image 16). New from 2024  CT. Possible sludge within the duct. No stone identified within the visible duct. Liver: Mild if any intrahepatic biliary ductal dilatation. No discrete liver lesion. Background echogenicity within normal limits. But the liver contour does appear mildly nodular (image 33). Portal vein is patent on color Doppler imaging with normal direction of blood flow towards the liver. Other: Negative visible right kidney. No right upper quadrant free fluid. IMPRESSION: 1. Small gallstones with new CBD enlargement since 2024,  suspicious for Choledocholithiasis. 2. Furthermore, there is Abnormal Gallbladder Wall Thickening and heterogeneity. However, no sonographic Murphy sign elicited to strongly indicate acute cholecystitis. 3. Mildly nodular liver contour, early Cirrhosis not excluded. Electronically Signed   By: VEAR Hurst M.D.   On: 09/07/2023 06:21   CT Renal Stone Study Result Date: 09/07/2023 CLINICAL DATA:  Right upper quadrant abdominal pain, flank pain EXAM: CT ABDOMEN AND PELVIS WITHOUT CONTRAST TECHNIQUE: Multidetector CT imaging of the abdomen and pelvis was performed following the standard protocol without IV contrast. RADIATION DOSE REDUCTION: This exam was performed according to the departmental dose-optimization program which includes automated exposure control, adjustment of the mA and/or kV according to patient size and/or use of iterative reconstruction technique. COMPARISON:  05/09/2022 FINDINGS: Lower chest: No acute abnormality.  Cardiomegaly. Hepatobiliary: Cholelithiasis without superimposed pericholecystic inflammatory change. Liver unremarkable on this noncontrast examination. No intra or extrahepatic biliary ductal dilation. Pancreas: Unremarkable Spleen: Unremarkable Adrenals/Urinary Tract: Adrenal glands are unremarkable. Kidneys are normal, without renal calculi, focal lesion, or hydronephrosis. Bladder is unremarkable. Stomach/Bowel: Stomach is within normal limits. Appendix appears normal. No  evidence of bowel wall thickening, distention, or inflammatory changes. Vascular/Lymphatic: Aortic atherosclerosis. No enlarged abdominal or pelvic lymph nodes. Reproductive: Multiple calcified involuted uterine fibroids are seen within the uterus. The pelvic organs are otherwise unremarkable. Other: No abdominal wall hernia or abnormality. No abdominopelvic ascites. Musculoskeletal: No acute bone abnormality. No lytic or blastic bone lesion. Osseous structures are age appropriate. IMPRESSION: 1. No acute intra-abdominal pathology identified. No urolithiasis. 2. Cholelithiasis. 3. Cardiomegaly. Aortic Atherosclerosis (ICD10-I70.0). Electronically Signed   By: Dorethia Molt M.D.   On: 09/07/2023 01:12      Assessment/Plan Acute biliary pancreatitis  - CT stone study from 09/07/2023 showed cholelithiasis without superimposed pericholecystic inflammatory change. No intra or extrahepatic biliary ductal dilation. - Lipase >2800 on admit - AST/ALT and Tbili all increased today from admission - Concern for possible choledocholithiasis on US , in setting of rising LFTs and Tbili would recommend MRCP. MRCP planned by GI. - Surgery will follow and assess readiness for cholecystectomy pending clinical progress.   FEN: NPO, LR@100cc /h VTE: heparin  gtt ID: no current abx  - per [facility] -  HTN HLD Chronic diastolic CHF Atrial fibrillation CKD stage III T2DM GERD PAD Hypothyroidism  I reviewed ED provider notes, hospitalist notes, last 24 h vitals and pain scores, last 48 h intake and output, last 24 h labs and trends, and last 24 h imaging results.  This care required high  level of medical decision making.    Marjorie Carlyon Favre, [facility] Surgery 09/07/2023, 3:40 PM Please see Amion for pager number during day hours 7:00am-4:30pm

## 2023-09-07 NOTE — Plan of Care (Signed)

## 2023-09-07 NOTE — Progress Notes (Signed)
 PROGRESS NOTE    Nicole Jimenez  FMW:[id number] DOB: 01/01/1935 DOA: 09/07/2023 PCP: Jodie Lavern LITTIE, MD   Brief Narrative:  HPI: Nicole Jimenez is a 89 y.o. female with history of A-fib, hypertension, hyperlipidemia, chronic kidney disease stage III, diabetes mellitus type 2, thrombocytopenia was brought to the ER after patient started complaining of severe pain with abdominal pain radiating to her back and across the abdomen with nausea vomiting since last night.  Had a similar episode about a month ago which was self-limited.  Denies any diarrhea fever or chills.  Denies drinking alcohol.   ED Course: In the ER patient's labs show elevated lipase with mildly elevated AST ALT and total bilirubin of 1.4.  CT abdomen pelvis shows cholelithiasis and ultrasound abdomen shows which is concerning for choledocholithiasis.  Gallbladder thickening was present but no Murphy sign.  ER physician discussed the [facility] gastroenterologist.  Placed patient on IV fluids and pain relief medications admitted for acute pancreatitis likely secondary to gallstones.  Assessment & Plan:   Principal Problem:   Acute pancreatitis Active Problems:   Essential hypertension   Type 2 diabetes mellitus with peripheral neuropathy (HCC)   Permanent atrial fibrillation (HCC)   Thrombocytopenia (HCC)   Chronic kidney disease, stage 3b (HCC)  Acute gallstone pancreatitis/acute choledocholithiasis/elevated LFTs/possible acute cholecystitis, POA: Pain improving.  Lipase significantly elevated.  Patient remains NPO.  GI consulted.  Awaiting final labs today.  May benefit from MRCP.  Although no clear indication of cholecystitis, may need HIDA scan, will also need cholecystectomy during this hospitalization.  I have consulted general surgery.  Defer to surgery for HIDA scan.  Paroxysmal atrial fibrillation: Rates controlled, continue Coreg  and Cardizem .  Hold Eliquis  for possible surgical intervention.  Continue heparin  in  the meantime.  Essential hypertension: Controlled.  Continue Cardizem  and Coreg .  CKD stage IIIb/IV: GFR labile remaining in the range of stage IIIb and IV.  Currently at baseline.  Hyperlipidemia:  on statins but due to LFTs, will discontinue that for now.  Chronic thrombocytopenia: Stable, no signs of bleeding.  Follow CBC.  Type 2 diabetes mellitus: Takes Jardiance  at home.  Last hemoglobin A1c 6.5.  Continue SSI only.  DVT prophylaxis: Heparin    Code Status: Full Code  Family Communication:  None present at bedside.  Plan of care discussed with patient in length and he/she verbalized understanding and agreed with it.  Status is: Inpatient Remains inpatient appropriate because: Improving pancreatitis, needs to be seen by GI and general surgery.  Will likely require cholecystectomy.   Estimated body mass index is 24.2 kg/m as calculated from the following:   Height as of this encounter: 5' 1 (1.549 m).   Weight as of this encounter: 58.1 kg.    Nutritional Assessment: Body mass index is 24.2 kg/m.SABRA Seen by dietician.  I agree with the assessment and plan as outlined below: Nutrition Status:        . Skin Assessment: I have examined the patient's skin and I agree with the wound assessment as performed by the wound care RN as outlined below:    Consultants:  GI and general surgery  Procedures:  As above  Antimicrobials:  Anti-infectives (From admission, onward)    None         Subjective: Patient seen and examined, she says that her abdominal pain is completely resolved.  She has no other complaint.  She is fully alert and oriented.  Objective: Vitals:   09/07/23 0200 09/07/23 0215 09/07/23 9556  09/07/23 0632  BP: (!) 146/65 (!) 147/66 (!) 171/63 (!) 152/73  Pulse: 65 72 97 99  Resp: 17 (!) 22 16 17   Temp:   99 F (37.2 C) 99.2 F (37.3 C)  TempSrc:   Oral Oral  SpO2: 97% 94% 94% 98%  Weight:   58.1 kg   Height:   5' 1 (1.549 m)      Intake/Output Summary (Last 24 hours) at 09/07/2023 0805 Last data filed at 09/07/2023 0634 Gross per 24 hour  Intake 1315.47 ml  Output --  Net 1315.47 ml   Filed Weights   09/07/23 0021 09/07/23 0443  Weight: 58.1 kg 58.1 kg    Examination:  General exam: Appears calm and comfortable  Respiratory system: Clear to auscultation. Respiratory effort normal. Cardiovascular system: S1 & S2 heard, RRR. No JVD, murmurs, rubs, gallops or clicks. No pedal edema. Gastrointestinal system: Abdomen is nondistended, soft and nontender. No organomegaly or masses felt. Normal bowel sounds heard. Central nervous system: Alert and oriented. No focal neurological deficits. Extremities: Symmetric 5 x 5 power. Skin: No rashes, lesions or ulcers Psychiatry: Judgement and insight appear normal. Mood & affect appropriate.    Data Reviewed: I have personally reviewed following labs and imaging studies  CBC: Recent Labs  Lab 09/07/23 0029 09/07/23 0734  WBC 8.7 6.9  NEUTROABS 6.9 6.2  HGB 13.2 12.3  HCT 40.9 39.2  MCV 88.7 89.5  PLT 133* 102*   Basic Metabolic Panel: Recent Labs  Lab 09/07/23 0029  NA 138  K 3.5  CL 101  CO2 23  GLUCOSE 169*  BUN 34*  CREATININE 1.46*  CALCIUM  9.4   GFR: Estimated Creatinine Clearance: 22.7 mL/min (A) (by C-G formula based on SCr of 1.46 mg/dL (H)). Liver Function Tests: Recent Labs  Lab 09/07/23 0029  AST 146*  ALT 56*  ALKPHOS 167*  BILITOT 1.4*  PROT 7.9  ALBUMIN 4.2   Recent Labs  Lab 09/07/23 0029  LIPASE >2,800*   No results for input(s): AMMONIA in the last 168 hours. Coagulation Profile: No results for input(s): INR, PROTIME in the last 168 hours. Cardiac Enzymes: No results for input(s): CKTOTAL, CKMB, CKMBINDEX, TROPONINI in the last 168 hours. BNP (last 3 results) No results for input(s): PROBNP in the last 8760 hours. HbA1C: No results for input(s): HGBA1C in the last 72 hours. CBG: Recent Labs   Lab 09/07/23 0745  GLUCAP 130*   Lipid Profile: No results for input(s): CHOL, HDL, LDLCALC, TRIG, CHOLHDL, LDLDIRECT in the last 72 hours. Thyroid  Function Tests: No results for input(s): TSH, T4TOTAL, FREET4, T3FREE, THYROIDAB in the last 72 hours. Anemia Panel: No results for input(s): VITAMINB12, FOLATE, FERRITIN, TIBC, IRON, RETICCTPCT in the last 72 hours. Sepsis Labs: No results for input(s): PROCALCITON, LATICACIDVEN in the last 168 hours.  No results found for this or any previous visit (from the past 240 hours).   Radiology Studies: US  Abdomen Limited RUQ (LIVER/GB) Result Date: 09/07/2023 CLINICAL DATA:  89 year old female with abdominal pain. Cholelithiasis on CT Abdomen and Pelvis today. EXAM: ULTRASOUND ABDOMEN LIMITED RIGHT UPPER QUADRANT COMPARISON:  Noncontrast CT Abdomen and Pelvis 0036 hours. Previous CT Abdomen and Pelvis 04/14/2022 FINDINGS: Gallbladder: Abnormal gallbladder wall thickening and heterogeneity up to 10 mm on image 7. Incompletely distended gallbladder. Evidence of small nonshadowing echogenic stones (images 8 and 13). Individual stone size estimated up to 8 mm. However, no sonographic Murphy sign elicited. Common bile duct: Diameter: Dilated up to 8 mm, concordant with CT  today (image 16). New from 2024 CT. Possible sludge within the duct. No stone identified within the visible duct. Liver: Mild if any intrahepatic biliary ductal dilatation. No discrete liver lesion. Background echogenicity within normal limits. But the liver contour does appear mildly nodular (image 33). Portal vein is patent on color Doppler imaging with normal direction of blood flow towards the liver. Other: Negative visible right kidney. No right upper quadrant free fluid. IMPRESSION: 1. Small gallstones with new CBD enlargement since 2024, suspicious for Choledocholithiasis. 2. Furthermore, there is Abnormal Gallbladder Wall Thickening and  heterogeneity. However, no sonographic Murphy sign elicited to strongly indicate acute cholecystitis. 3. Mildly nodular liver contour, early Cirrhosis not excluded. Electronically Signed   By: VEAR Hurst M.D.   On: 09/07/2023 06:21   CT Renal Stone Study Result Date: 09/07/2023 CLINICAL DATA:  Right upper quadrant abdominal pain, flank pain EXAM: CT ABDOMEN AND PELVIS WITHOUT CONTRAST TECHNIQUE: Multidetector CT imaging of the abdomen and pelvis was performed following the standard protocol without IV contrast. RADIATION DOSE REDUCTION: This exam was performed according to the departmental dose-optimization program which includes automated exposure control, adjustment of the mA and/or kV according to patient size and/or use of iterative reconstruction technique. COMPARISON:  05/09/2022 FINDINGS: Lower chest: No acute abnormality.  Cardiomegaly. Hepatobiliary: Cholelithiasis without superimposed pericholecystic inflammatory change. Liver unremarkable on this noncontrast examination. No intra or extrahepatic biliary ductal dilation. Pancreas: Unremarkable Spleen: Unremarkable Adrenals/Urinary Tract: Adrenal glands are unremarkable. Kidneys are normal, without renal calculi, focal lesion, or hydronephrosis. Bladder is unremarkable. Stomach/Bowel: Stomach is within normal limits. Appendix appears normal. No evidence of bowel wall thickening, distention, or inflammatory changes. Vascular/Lymphatic: Aortic atherosclerosis. No enlarged abdominal or pelvic lymph nodes. Reproductive: Multiple calcified involuted uterine fibroids are seen within the uterus. The pelvic organs are otherwise unremarkable. Other: No abdominal wall hernia or abnormality. No abdominopelvic ascites. Musculoskeletal: No acute bone abnormality. No lytic or blastic bone lesion. Osseous structures are age appropriate. IMPRESSION: 1. No acute intra-abdominal pathology identified. No urolithiasis. 2. Cholelithiasis. 3. Cardiomegaly. Aortic Atherosclerosis  (ICD10-I70.0). Electronically Signed   By: Dorethia Molt M.D.   On: 09/07/2023 01:12    Scheduled Meds:  carvedilol   12.5 mg Oral BID   diltiazem   180 mg Oral q morning   insulin  aspart  0-6 Units Subcutaneous Q4H   rosuvastatin   40 mg Oral Daily   Continuous Infusions:  heparin      lactated ringers  100 mL/hr at 09/07/23 0313     LOS: 0 days   Fredia Skeeter, MD [facility] Total time spent: 40 minutes  09/07/2023, 8:05 AM   *Please note that this is a verbal dictation therefore any spelling or grammatical errors are due to the Dragon Medical One system interpretation.  Please page via Amion and do not message via secure chat for urgent patient care matters. Secure chat can be used for non urgent patient care matters.  How to contact the [facility] Attending or Consulting provider 7A - 7P or covering provider during after hours 7P -7A, for this patient?  Check the care team in [facility] and look for a) attending/consulting [facility] provider listed and b) the [facility] team listed. Page or secure chat 7A-7P. Log into [email or web address] and use [facility]'s universal password to access. If you do not have the password, please contact the hospital operator. Locate the [facility] provider you are looking for under [facility] and page to a number that you can be directly reached. If you still have difficulty reaching the  provider, please page the [facility] (Director on Call) for the Hospitalists listed on amion for assistance.

## 2023-09-07 NOTE — ED Provider Notes (Signed)
 [place] EMERGENCY DEPARTMENT AT [facility] Provider Note   CSN: [id number] Arrival date & time: 09/07/23  9987     Patient presents with: Emesis and Abdominal Pain   Nicole Jimenez is a 89 y.o. female.   89 year old female who presents the ER today secondary to right upper quadrant abdominal pain.  Started a few hours prior to arrival.  Had some nausea and vomiting with it.  No diarrhea or constipation.  Patient stated that she has no history of the same.  Does not drink alcohol.  No drugs.  She does not know of any gallbladder history however on review of the record she did have cholelithiasis on her previous CT scan.  No history of kidney stones.   Emesis Associated symptoms: abdominal pain   Abdominal Pain Associated symptoms: vomiting        Prior to Admission medications   Medication Sig Start Date End Date Taking? Authorizing Provider  Acetaminophen  500 MG coapsule Take 1,000 mg by mouth every 6 (six) hours as needed for pain.    [provider]  apixaban  (ELIQUIS ) 2.5 MG TABS tablet Take 1 tablet (2.5 mg total) by mouth 2 (two) times daily. 12/28/22   Jodie Lavern CROME, MD  Calcium  Carb-Cholecalciferol 600-800 MG-UNIT CHEW Chew 1 each by mouth daily.     [provider]  carvedilol  (COREG ) 12.5 MG tablet Take 1 tablet (12.5 mg total) by mouth 2 (two) times daily. 03/01/23 08/23/23  Walker, Caitlin S, NP  cycloSPORINE (RESTASIS) 0.05 % ophthalmic emulsion 1 drop 2 (two) times daily.    [provider]  diclofenac  sodium (VOLTAREN ) 1 % GEL APPLY 2 G TOPICALLY 4 (FOUR) TIMES DAILY AS NEEDED. **PA DENIED** Patient taking differently: Apply 2 g topically daily as needed (For pain). 11/01/17   Jodie Lavern CROME, MD  diltiazem  (CARDIZEM  CD) 180 MG 24 hr capsule TAKE 1 CAPSULE (180 MG TOTAL) BY MOUTH EVERY MORNING 02/12/23   Jodie Lavern CROME, MD  empagliflozin  (JARDIANCE ) 10 MG TABS tablet Take 1 tablet (10 mg total) by mouth daily. 07/27/23   Jodie Lavern CROME, MD  Glucosamine-MSM-Hyaluronic Acd (JOINT HEALTH PO) Take 1 tablet by mouth daily.    [provider]  glucose blood (ONETOUCH ULTRA) test strip USE TWICE DAILY TO CHECK BLOOD SUGAR - E11.40 12/27/22   Jodie Lavern CROME, MD  Krill Oil 500 MG CAPS Take 500 mg by mouth daily.    [provider]  Multiple Vitamins-Minerals (MULTIVITAMIN ADULT PO) Take by mouth.    [provider]  omeprazole  (PRILOSEC) 20 MG capsule Take 20 mg by mouth daily. 03/12/23   [provider]  rosuvastatin  (CRESTOR ) 40 MG tablet Take 1 tablet (40 mg total) by mouth daily. 11/01/22   Jodie Lavern CROME, MD  vortioxetine  HBr (TRINTELLIX ) 10 MG TABS tablet Take 1 tablet (10 mg total) by mouth daily. 12/28/22   Jodie Lavern CROME, MD    Allergies: Amoxicillin-pot clavulanate, Amoxicillin, and Clarithromycin    Review of Systems  Gastrointestinal:  Positive for abdominal pain and vomiting.    Updated Vital Signs BP (!) 147/66   Pulse 72   Temp 97.8 F (36.6 C) (Oral)   Resp (!) 22   Ht 5' 1 (1.549 m)   Wt 58.1 kg   SpO2 94%   BMI 24.19 kg/m   Physical Exam Vitals and nursing note reviewed.  Constitutional:      Appearance: She is well-developed.  HENT:     Head:  Normocephalic and atraumatic.  Cardiovascular:     Rate and Rhythm: Normal rate and regular rhythm.  Pulmonary:     Effort: No respiratory distress.     Breath sounds: No stridor.  Abdominal:     General: There is no distension.     Tenderness: There is abdominal tenderness in the right upper quadrant. There is no guarding or rebound.  Musculoskeletal:     Cervical back: Normal range of motion.  Neurological:     Mental Status: She is alert.     (all labs ordered are listed, but only abnormal results are displayed) Labs Reviewed  LIPASE, BLOOD - Abnormal; Notable for the following components:      Result Value   Lipase >2,800 (*)    All other components within normal limits  COMPREHENSIVE METABOLIC  PANEL WITH GFR - Abnormal; Notable for the following components:   Glucose, Bld 169 (*)    BUN 34 (*)    Creatinine, Ser 1.46 (*)    AST 146 (*)    ALT 56 (*)    Alkaline Phosphatase 167 (*)    Total Bilirubin 1.4 (*)    GFR, Estimated 35 (*)    All other components within normal limits  URINALYSIS, ROUTINE W REFLEX MICROSCOPIC - Abnormal; Notable for the following components:   Color, Urine COLORLESS (*)    Glucose, UA >1,000 (*)    Protein, ur TRACE (*)    Bacteria, UA RARE (*)    All other components within normal limits  CBC WITH DIFFERENTIAL/PLATELET - Abnormal; Notable for the following components:   Platelets 133 (*)    All other components within normal limits    EKG: None  Radiology: CT Renal Stone Study Result Date: 09/07/2023 CLINICAL DATA:  Right upper quadrant abdominal pain, flank pain EXAM: CT ABDOMEN AND PELVIS WITHOUT CONTRAST TECHNIQUE: Multidetector CT imaging of the abdomen and pelvis was performed following the standard protocol without IV contrast. RADIATION DOSE REDUCTION: This exam was performed according to the departmental dose-optimization program which includes automated exposure control, adjustment of the mA and/or kV according to patient size and/or use of iterative reconstruction technique. COMPARISON:  05/09/2022 FINDINGS: Lower chest: No acute abnormality.  Cardiomegaly. Hepatobiliary: Cholelithiasis without superimposed pericholecystic inflammatory change. Liver unremarkable on this noncontrast examination. No intra or extrahepatic biliary ductal dilation. Pancreas: Unremarkable Spleen: Unremarkable Adrenals/Urinary Tract: Adrenal glands are unremarkable. Kidneys are normal, without renal calculi, focal lesion, or hydronephrosis. Bladder is unremarkable. Stomach/Bowel: Stomach is within normal limits. Appendix appears normal. No evidence of bowel wall thickening, distention, or inflammatory changes. Vascular/Lymphatic: Aortic atherosclerosis. No enlarged  abdominal or pelvic lymph nodes. Reproductive: Multiple calcified involuted uterine fibroids are seen within the uterus. The pelvic organs are otherwise unremarkable. Other: No abdominal wall hernia or abnormality. No abdominopelvic ascites. Musculoskeletal: No acute bone abnormality. No lytic or blastic bone lesion. Osseous structures are age appropriate. IMPRESSION: 1. No acute intra-abdominal pathology identified. No urolithiasis. 2. Cholelithiasis. 3. Cardiomegaly. Aortic Atherosclerosis (ICD10-I70.0). Electronically Signed   By: Dorethia Molt M.D.   On: 09/07/2023 01:12     Procedures   Medications Ordered in the ED  lactated ringers  infusion ( Intravenous New Bag/Given 09/07/23 0313)  fentaNYL  (SUBLIMAZE ) injection 50 mcg (has no administration in time range)  fentaNYL  (SUBLIMAZE ) injection 50 mcg (50 mcg Intravenous Given 09/07/23 0044)  ondansetron  (ZOFRAN ) injection 4 mg (4 mg Intravenous Given 09/07/23 0043)  lactated ringers  bolus 1,000 mL (0 mLs Intravenous Stopped 09/07/23 0256)  Medical Decision Making Amount and/or Complexity of Data Reviewed Labs: ordered. Radiology: ordered. ECG/medicine tests: ordered.  Risk Prescription drug management. Decision regarding hospitalization.   Suspect likely cholecystitis so CT stone study done however we do not have ultrasound at this time so we held on that.  Her lipase came back undetectably high with mild elevation in her liver enzymes as well.  Possible choledocholithiasis, discussed with the radiologist and no obvious inflammatory changes seen on the CT scan although it was done without contrast secondary to her kidney function.  They recommend MRI to further delineate.  I discussed with hospitalist for admission they will admit to [facility] so [facility] gastroenterology consulted per institutional protocol for evaluation in the morning.  Patient's pain is tolerable at this time.     Final diagnoses:   Elevated lipase  Calculus of gallbladder without cholecystitis without obstruction    ED Discharge Orders     None          Ilyana Manuele, Selinda, MD 09/07/23 [phone]

## 2023-09-07 NOTE — ED Notes (Signed)
 Patient transported to CT

## 2023-09-07 NOTE — Progress Notes (Signed)
 PHARMACY - ANTICOAGULATION CONSULT NOTE  Pharmacy Consult for heparin   Indication: atrial fibrillation  Allergies  Allergen Reactions   Amoxicillin-Pot Clavulanate Other (See Comments)    unknown   Amoxicillin Other (See Comments)    unknown   Clarithromycin Other (See Comments)    unknown    Patient Measurements: Height: 5' 1 (154.9 cm) Weight: 58.1 kg (128 lb 1.4 oz) IBW/kg (Calculated) : 47.8 HEPARIN  DW (KG): 58.1  Vital Signs: Temp: 99.1 F (37.3 C) (07/30 1314) Temp Source: Oral (07/30 1314) BP: 90/39 (07/30 1416) Pulse Rate: 84 (07/30 1416)  Labs: Recent Labs    09/07/23 0029 09/07/23 0734  HGB 13.2 12.3  HCT 40.9 39.2  PLT 133* 102*  APTT  --  38*  HEPARINUNFRC  --  >1.10*  CREATININE 1.46* 1.18*    Estimated Creatinine Clearance: 28 mL/min (A) (by C-G formula based on SCr of 1.18 mg/dL (H)).   Medical History: Past Medical History:  Diagnosis Date   Arthritis    Depression    Diabetes mellitus without complication (HCC)    Borderline   Diastolic CHF (HCC) 03/03/2017   Echo 11/2016, nl EF but LVH and diastolic dysfunction   GERD (gastroesophageal reflux disease)    Hyperlipidemia    Hypertension    Peripheral arterial disease (HCC) 2018   thromboembolism of femoral artery   Thyroid  disease    subclinical hyperthyroidism managed by endocrinology    Medications:  Medications Prior to Admission  Medication Sig Dispense Refill Last Dose/Taking   apixaban  (ELIQUIS ) 2.5 MG TABS tablet Take 1 tablet (2.5 mg total) by mouth 2 (two) times daily. 180 tablet 3 09/06/2023 Morning   Calcium  Carb-Cholecalciferol 600-800 MG-UNIT CHEW Chew 2 each by mouth daily.   09/05/2023   carvedilol  (COREG ) 12.5 MG tablet Take 1 tablet (12.5 mg total) by mouth 2 (two) times daily. 180 tablet 3 09/06/2023   cycloSPORINE (RESTASIS) 0.05 % ophthalmic emulsion Place 1 drop into both eyes 2 (two) times daily.   09/05/2023   diclofenac  sodium (VOLTAREN ) 1 % GEL APPLY 2 G  TOPICALLY 4 (FOUR) TIMES DAILY AS NEEDED. **PA DENIED** (Patient taking differently: Apply 1 Application topically daily as needed (For pain).) 200 g 1 09/05/2023   diltiazem  (CARDIZEM  CD) 180 MG 24 hr capsule TAKE 1 CAPSULE (180 MG TOTAL) BY MOUTH EVERY MORNING 90 capsule 3 09/05/2023   empagliflozin  (JARDIANCE ) 10 MG TABS tablet Take 1 tablet (10 mg total) by mouth daily. 90 tablet 3 09/05/2023   Glucosamine-MSM-Hyaluronic Acd (JOINT HEALTH PO) Take 2 tablets by mouth daily.   09/05/2023   Krill Oil 500 MG CAPS Take 500 mg by mouth daily.   09/05/2023   Multiple Vitamins-Minerals (MULTIVITAMIN ADULT PO) Take 1 tablet by mouth daily.   09/06/2023   omeprazole  (PRILOSEC) 20 MG capsule Take 20 mg by mouth daily.   09/06/2023   rosuvastatin  (CRESTOR ) 40 MG tablet Take 1 tablet (40 mg total) by mouth daily. 90 tablet 3 09/05/2023   vortioxetine  HBr (TRINTELLIX ) 10 MG TABS tablet Take 1 tablet (10 mg total) by mouth daily. 90 tablet 3 09/05/2023   glucose blood (ONETOUCH ULTRA) test strip USE TWICE DAILY TO CHECK BLOOD SUGAR - E11.40 100 strip 5     Assessment: Pharmacy consulted to manage heparin  bridge therapy for Afib in 89 yo F on apixaban  PTA. Apixaban  2.5 bid PTA last dose - 7/29 evening dose around 9 pm per pt report to RN PMH thrombocytopenia, CKD3b, Afib, HTN, HLD, DM2.  Baseline  aPTT 38, baseline HL falsely elevated d/t recent DOAC use. aPTT >200, supratherapeutic on heparin  850 units/hr Labs drawn from opposite arm.  No bleeding, complications, or IV site issues reported by RN.   Goal of Therapy:  Heparin  level 0.3-0.7 units/ml aPTT 66-102 seconds Monitor platelets by anticoagulation protocol: Yes   Plan:  Hold heparin  IV for one hour Resume at reduced heparin  IV infusion 650 units/hr Check 8 hour aPTT Daily CBC, heparin  level & aPTT F/u plans for cholecystectomy, MRCP    Wanda Hasting PharmD, BCPS [facility] main pharmacy [phone] 09/07/2023 6:13 PM

## 2023-09-07 NOTE — Progress Notes (Signed)
 Plan of Care Note for accepted transfer   Patient: Nicole Jimenez MRN: [id number]   DOA: 09/07/2023  Facility requesting transfer: [facility]   Requesting Provider: Dr. Lorette   Reason for transfer: Acute pancreatitis   Facility course: 89 yr old female with HTN, HLD, CKD 3B, and atrial fibrillation on Eliquis  presents with RUQ abdominal pain and N/V.   Labs are most notable for lipase >28000, ASST 146, ALT 56, total bilirubin 1.4, and normal WBC. Non-contrast CT demonstrates cholelithiasis.   She was given 1 liter LR, Zofran , and fentanyl .   Plan of care: The patient is accepted for admission to Med-surg  unit, at [facility].   Author: Evalene GORMAN Sprinkles, MD 09/07/2023  Check [email or web address] for on-call coverage.  Nursing staff, Please call [facility] Admits & Consults System-Wide number on Amion as soon as patient's arrival, so appropriate admitting provider can evaluate the pt.

## 2023-09-07 NOTE — TOC Initial Note (Signed)
 Transition of Care [facility]) - Initial/Assessment Note    Patient Details  Name: Nicole Jimenez MRN: [id number] Date of Birth: Apr 29, 1934  Transition of Care [facility]) CM/SW Contact:    Alfonse JONELLE Rex, RN Phone Number: 09/07/2023, 11:37 AM  Clinical Narrative:   Met with patietn at bedside to introduce role of TOC/NCM and review for dc planning, pt confirmed she has an established PCP, no current home care services or home DME, reports her son resides with her and she feels safe returning home. TOC will continue to follow.                 Expected Discharge Plan: Home/Self Care Barriers to Discharge: Continued Medical Work up   Patient Goals and CMS Choice Patient states their goals for this hospitalization and ongoing recovery are:: return home          Expected Discharge Plan and Services       Living arrangements for the past 2 months: Single Family Home                                      Prior Living Arrangements/Services Living arrangements for the past 2 months: Single Family Home Lives with:: Adult Children Patient language and need for interpreter reviewed:: Yes Do you feel safe going back to the place where you live?: Yes      Need for Family Participation in Patient Care: Yes (Comment) Care giver support system in place?: Yes (comment)   Criminal Activity/Legal Involvement Pertinent to Current Situation/Hospitalization: No - Comment as needed  Activities of Daily Living   ADL Screening (condition at time of admission) Independently performs ADLs?: Yes (appropriate for developmental age) Is the patient deaf or have difficulty hearing?: No Does the patient have difficulty seeing, even when wearing glasses/contacts?: No Does the patient have difficulty concentrating, remembering, or making decisions?: No  Permission Sought/Granted                  Emotional Assessment Appearance:: Appears stated age Attitude/Demeanor/Rapport: Engaged Affect  (typically observed): Accepting Orientation: : Oriented to Self, Oriented to Place, Oriented to  Time, Oriented to Situation Alcohol / Substance Use: Not Applicable Psych Involvement: No (comment)  Admission diagnosis:  Acute pancreatitis [K85.90] Calculus of gallbladder without cholecystitis without obstruction [K80.20] Elevated lipase [R74.8] Patient Active Problem List   Diagnosis Date Noted   Acute pancreatitis 09/07/2023   Thrombocytopenia (HCC) 11/02/2022   Chronic kidney disease, stage 3b (HCC) 11/02/2022   Gout 11/01/2022   History of arterial embolism 08/21/2018   Anticoagulant long-term use 03/06/2018   Permanent atrial fibrillation (HCC) 01/23/2018   Chronic diastolic CHF (congestive heart failure) (HCC) 03/03/2017   Bilateral edema of lower extremity 01/26/2017   DJD of right shoulder 01/24/2017   Type 2 diabetes mellitus with peripheral neuropathy (HCC) 01/24/2017   Bereavement due to life event 03/05/2016   Osteoporosis of femur without pathological fracture 08/23/2015   Bilateral hip bursitis 07/17/2015   Age-related nuclear cataract of both eyes 03/11/2015   Idiopathic scoliosis of thoracic spine 07/09/2014   Subclinical hyperthyroidism 01/21/2014   Insomnia 09/04/2013   Essential hypertension 02/23/2013   Combined hyperlipidemia associated with type 2 diabetes mellitus (HCC) 02/23/2013   Sjogren's syndrome (HCC) 08/04/2012   Major depression, recurrent, chronic (HCC) 07/18/2012   DDD (degenerative disc disease), lumbosacral 07/15/2012   Lumbosacral spondylosis 07/15/2012   Osteoarthritis of knee 07/15/2012  Allergic rhinitis 07/15/2012   PCP:  Jodie Lavern CROME, MD Pharmacy:   [facility] [phone] - [place], [place] - [address] [place] [address] Phone: [phone] Fax: [phone]     Social Drivers of Health (SDOH) Social History: SDOH Screenings   Food Insecurity: No Food Insecurity (09/07/2023)   Housing: Low Risk  (09/07/2023)  Transportation Needs: No Transportation Needs (09/07/2023)  Utilities: Not At Risk (09/07/2023)  Alcohol Screen: Low Risk  (06/30/2023)  Depression (PHQ2-9): Low Risk  (08/23/2023)  Financial Resource Strain: Low Risk  (06/30/2023)  Physical Activity: Unknown (08/22/2023)  Recent Concern: Physical Activity - Inactive (06/30/2023)  Social Connections: Moderately Integrated (09/07/2023)  Stress: Stress Concern Present (08/22/2023)  Tobacco Use: Low Risk  (09/07/2023)  Health Literacy: Adequate Health Literacy (06/30/2023)   SDOH Interventions:     Readmission Risk Interventions    09/07/2023   11:36 AM  Readmission Risk Prevention Plan  Post Dischage Appt Complete  Medication Screening Complete  Transportation Screening Complete

## 2023-09-07 NOTE — H&P (Signed)
 History and Physical    Nicole Jimenez FMW:[id number] DOB: [birth year] DOA: 09/07/2023  Patient coming from: Home.  Chief Complaint: Abdominal pain.  HPI: Nicole Jimenez is a 89 y.o. female with history of A-fib, hypertension, hyperlipidemia, chronic kidney disease stage III, diabetes mellitus type 2, thrombocytopenia was brought to the ER after patient started complaining of severe pain with abdominal pain radiating to her back and across the abdomen with nausea vomiting since last night.  Had a similar episode about a month ago which was self-limited.  Denies any diarrhea fever or chills.  Denies drinking alcohol.  ED Course: In the ER patient's labs show elevated lipase with mildly elevated AST ALT and total bilirubin of 1.4.  CT abdomen pelvis shows cholelithiasis and ultrasound abdomen shows which is concerning for choledocholithiasis.  Gallbladder thickening was present but no Murphy sign.  ER physician discussed the [facility] gastroenterologist.  Placed patient on IV fluids and pain relief medications admitted for acute pancreatitis likely secondary to gallstones.  Review of Systems: As per HPI, rest all negative.   Past Medical History:  Diagnosis Date   Arthritis    Depression    Diabetes mellitus without complication (HCC)    Borderline   Diastolic CHF (HCC) 03/03/2017   Echo 11/2016, nl EF but LVH and diastolic dysfunction   GERD (gastroesophageal reflux disease)    Hyperlipidemia    Hypertension    Peripheral arterial disease (HCC) 2018   thromboembolism of femoral artery   Thyroid  disease    subclinical hyperthyroidism managed by endocrinology    Past Surgical History:  Procedure Laterality Date   FEMORAL ENDARTERECTOMY Right 11/29/2016     reports that she has never smoked. She has never used smokeless tobacco. She reports that she does not drink alcohol and does not use drugs.  Allergies  Allergen Reactions   Amoxicillin-Pot Clavulanate Other (See Comments)     unknown   Amoxicillin Other (See Comments)    unknown   Clarithromycin Other (See Comments)    unknown    Family History  Problem Relation Age of Onset   Heart attack Mother    Arthritis Mother    Diabetes Father    Heart attack Father    Hyperlipidemia Father    Arthritis Sister    Diabetes Sister        adopted   Healthy Daughter    Healthy Son    Healthy Son    Other Son        cyst on thyroid     Prior to Admission medications   Medication Sig Start Date End Date Taking? Authorizing Provider  Acetaminophen  500 MG coapsule Take 1,000 mg by mouth every 6 (six) hours as needed for pain.    [provider]  apixaban  (ELIQUIS ) 2.5 MG TABS tablet Take 1 tablet (2.5 mg total) by mouth 2 (two) times daily. 12/28/22   Jodie Lavern LITTIE, MD  Calcium  Carb-Cholecalciferol 600-800 MG-UNIT CHEW Chew 1 each by mouth daily.     [provider]  carvedilol  (COREG ) 12.5 MG tablet Take 1 tablet (12.5 mg total) by mouth 2 (two) times daily. 03/01/23 08/23/23  Walker, Caitlin S, NP  cycloSPORINE (RESTASIS) 0.05 % ophthalmic emulsion 1 drop 2 (two) times daily.    [provider]  diclofenac  sodium (VOLTAREN ) 1 % GEL APPLY 2 G TOPICALLY 4 (FOUR) TIMES DAILY AS NEEDED. **PA DENIED** Patient taking differently: Apply 2 g topically daily as needed (For pain). 11/01/17   Jodie Lavern LITTIE,  MD  diltiazem  (CARDIZEM  CD) 180 MG 24 hr capsule TAKE 1 CAPSULE (180 MG TOTAL) BY MOUTH EVERY MORNING 02/12/23   Jodie Lavern CROME, MD  empagliflozin  (JARDIANCE ) 10 MG TABS tablet Take 1 tablet (10 mg total) by mouth daily. 07/27/23   Jodie Lavern CROME, MD  Glucosamine-MSM-Hyaluronic Acd (JOINT HEALTH PO) Take 1 tablet by mouth daily.    [provider]  glucose blood (ONETOUCH ULTRA) test strip USE TWICE DAILY TO CHECK BLOOD SUGAR - E11.40 12/27/22   Jodie Lavern CROME, MD  Krill Oil 500 MG CAPS Take 500 mg by mouth daily.    [provider]  Multiple Vitamins-Minerals (MULTIVITAMIN  ADULT PO) Take by mouth.    [provider]  omeprazole  (PRILOSEC) 20 MG capsule Take 20 mg by mouth daily. 03/12/23   [provider]  rosuvastatin  (CRESTOR ) 40 MG tablet Take 1 tablet (40 mg total) by mouth daily. 11/01/22   Jodie Lavern CROME, MD  vortioxetine  HBr (TRINTELLIX ) 10 MG TABS tablet Take 1 tablet (10 mg total) by mouth daily. 12/28/22   Jodie Lavern CROME, MD    Physical Exam: Constitutional: Moderately built and nourished. Vitals:   09/07/23 0200 09/07/23 0215 09/07/23 0443 09/07/23 0632  BP: (!) 146/65 (!) 147/66 (!) 171/63 (!) 152/73  Pulse: 65 72 97 99  Resp: 17 (!) 22 16 17   Temp:   99 F (37.2 C) 99.2 F (37.3 C)  TempSrc:   Oral Oral  SpO2: 97% 94% 94% 98%  Weight:   58.1 kg   Height:   5' 1 (1.549 m)    Eyes: Anicteric no pallor. ENMT: No discharge from the ears eyes nose and mouth. Neck: No mass felt.  No neck rigidity. Respiratory: No rhonchi or crepitations. Cardiovascular: S1-S2 heard. Abdomen: Nontender bowel sounds present. Musculoskeletal: No edema. Skin: No rash. Neurologic: Alert awake oriented to time place and person.  Moves all extremities. Psychiatric: Appears normal.  Normal affect.   Labs on Admission: I have personally reviewed following labs and imaging studies  CBC: Recent Labs  Lab 09/07/23 0029  WBC 8.7  NEUTROABS 6.9  HGB 13.2  HCT 40.9  MCV 88.7  PLT 133*   Basic Metabolic Panel: Recent Labs  Lab 09/07/23 0029  NA 138  K 3.5  CL 101  CO2 23  GLUCOSE 169*  BUN 34*  CREATININE 1.46*  CALCIUM  9.4   GFR: Estimated Creatinine Clearance: 22.7 mL/min (A) (by C-G formula based on SCr of 1.46 mg/dL (H)). Liver Function Tests: Recent Labs  Lab 09/07/23 0029  AST 146*  ALT 56*  ALKPHOS 167*  BILITOT 1.4*  PROT 7.9  ALBUMIN 4.2   Recent Labs  Lab 09/07/23 0029  LIPASE >2,800*   No results for input(s): AMMONIA in the last 168 hours. Coagulation Profile: No results for input(s): INR, PROTIME  in the last 168 hours. Cardiac Enzymes: No results for input(s): CKTOTAL, CKMB, CKMBINDEX, TROPONINI in the last 168 hours. BNP (last 3 results) No results for input(s): PROBNP in the last 8760 hours. HbA1C: No results for input(s): HGBA1C in the last 72 hours. CBG: No results for input(s): GLUCAP in the last 168 hours. Lipid Profile: No results for input(s): CHOL, HDL, LDLCALC, TRIG, CHOLHDL, LDLDIRECT in the last 72 hours. Thyroid  Function Tests: No results for input(s): TSH, T4TOTAL, FREET4, T3FREE, THYROIDAB in the last 72 hours. Anemia Panel: No results for input(s): VITAMINB12, FOLATE, FERRITIN, TIBC, IRON, RETICCTPCT in the last 72 hours. Urine analysis:  Component Value Date/Time   COLORURINE COLORLESS (A) 09/07/2023 0314   APPEARANCEUR CLEAR 09/07/2023 0314   LABSPEC 1.009 09/07/2023 0314   PHURINE 8.0 09/07/2023 0314   GLUCOSEU >1,000 (A) 09/07/2023 0314   HGBUR NEGATIVE 09/07/2023 0314   BILIRUBINUR NEGATIVE 09/07/2023 0314   KETONESUR NEGATIVE 09/07/2023 0314   PROTEINUR TRACE (A) 09/07/2023 0314   NITRITE NEGATIVE 09/07/2023 0314   LEUKOCYTESUR NEGATIVE 09/07/2023 0314   Sepsis Labs: @LABRCNTIP (procalcitonin:4,lacticidven:4) )No results found for this or any previous visit (from the past 240 hours).   Radiological Exams on Admission: US  Abdomen Limited RUQ (LIVER/GB) Result Date: 09/07/2023 CLINICAL DATA:  89 year old female with abdominal pain. Cholelithiasis on CT Abdomen and Pelvis today. EXAM: ULTRASOUND ABDOMEN LIMITED RIGHT UPPER QUADRANT COMPARISON:  Noncontrast CT Abdomen and Pelvis 0036 hours. Previous CT Abdomen and Pelvis 04/14/2022 FINDINGS: Gallbladder: Abnormal gallbladder wall thickening and heterogeneity up to 10 mm on image 7. Incompletely distended gallbladder. Evidence of small nonshadowing echogenic stones (images 8 and 13). Individual stone size estimated up to 8 mm. However, no sonographic Murphy  sign elicited. Common bile duct: Diameter: Dilated up to 8 mm, concordant with CT today (image 16). New from 2024 CT. Possible sludge within the duct. No stone identified within the visible duct. Liver: Mild if any intrahepatic biliary ductal dilatation. No discrete liver lesion. Background echogenicity within normal limits. But the liver contour does appear mildly nodular (image 33). Portal vein is patent on color Doppler imaging with normal direction of blood flow towards the liver. Other: Negative visible right kidney. No right upper quadrant free fluid. IMPRESSION: 1. Small gallstones with new CBD enlargement since 2024, suspicious for Choledocholithiasis. 2. Furthermore, there is Abnormal Gallbladder Wall Thickening and heterogeneity. However, no sonographic Murphy sign elicited to strongly indicate acute cholecystitis. 3. Mildly nodular liver contour, early Cirrhosis not excluded. Electronically Signed   By: VEAR Hurst M.D.   On: 09/07/2023 06:21   CT Renal Stone Study Result Date: 09/07/2023 CLINICAL DATA:  Right upper quadrant abdominal pain, flank pain EXAM: CT ABDOMEN AND PELVIS WITHOUT CONTRAST TECHNIQUE: Multidetector CT imaging of the abdomen and pelvis was performed following the standard protocol without IV contrast. RADIATION DOSE REDUCTION: This exam was performed according to the departmental dose-optimization program which includes automated exposure control, adjustment of the mA and/or kV according to patient size and/or use of iterative reconstruction technique. COMPARISON:  05/09/2022 FINDINGS: Lower chest: No acute abnormality.  Cardiomegaly. Hepatobiliary: Cholelithiasis without superimposed pericholecystic inflammatory change. Liver unremarkable on this noncontrast examination. No intra or extrahepatic biliary ductal dilation. Pancreas: Unremarkable Spleen: Unremarkable Adrenals/Urinary Tract: Adrenal glands are unremarkable. Kidneys are normal, without renal calculi, focal lesion, or  hydronephrosis. Bladder is unremarkable. Stomach/Bowel: Stomach is within normal limits. Appendix appears normal. No evidence of bowel wall thickening, distention, or inflammatory changes. Vascular/Lymphatic: Aortic atherosclerosis. No enlarged abdominal or pelvic lymph nodes. Reproductive: Multiple calcified involuted uterine fibroids are seen within the uterus. The pelvic organs are otherwise unremarkable. Other: No abdominal wall hernia or abnormality. No abdominopelvic ascites. Musculoskeletal: No acute bone abnormality. No lytic or blastic bone lesion. Osseous structures are age appropriate. IMPRESSION: 1. No acute intra-abdominal pathology identified. No urolithiasis. 2. Cholelithiasis. 3. Cardiomegaly. Aortic Atherosclerosis (ICD10-I70.0). Electronically Signed   By: Dorethia Molt M.D.   On: 09/07/2023 01:12    EKG: Independently reviewed.  A-fib rate controlled.  Assessment/Plan Principal Problem:   Acute pancreatitis Active Problems:   Essential hypertension   Type 2 diabetes mellitus with peripheral neuropathy (HCC)   Permanent  atrial fibrillation (HCC)   Thrombocytopenia (HCC)   Chronic kidney disease, stage 3b (HCC)    Acute pancreatitis likely from gallstones [facility] gastroenterologist was consulted by ER physician.  Will keep patient n.p.o. IV fluids.  Pain relief medications.  Check triglycerides.  Follow LFTs.  Patient eventually will need cholecystectomy. A-fib rate controlled.  Continue Coreg  and Cardizem .  Hold Eliquis .  Will keep patient on heparin  bridging. Hypertension on Cardizem  and Coreg . Chronic kidney disease stage III creatinine is around baseline. Hyperlipidemia on statins. Chronic thrombocytopenia follow CBC. Diabetes mellitus type 2 takes Jardiance  at home.  Last hemoglobin A1c was 6.5 a month ago.  Will keep patient on sliding scale coverage.  Since patient has acute pancreatitis will need further management and more than 2 midnight stay.   DVT  prophylaxis: Heparin  infusion. Code Status: Full code. Family Communication: Discussed with patient. Disposition Plan: Monitored bed. Consults called: [facility] was consulted by ER physician. Admission status: Inpatient.

## 2023-09-07 NOTE — Plan of Care (Signed)
   Problem: Education: Goal: Knowledge of General Education information will improve Description Including pain rating scale, medication(s)/side effects and non-pharmacologic comfort measures Outcome: Progressing   Problem: Health Behavior/Discharge Planning: Goal: Ability to manage health-related needs will improve Outcome: Progressing

## 2023-09-07 NOTE — ED Notes (Signed)
 [facility] at bedside. Pt signed consent to transfer. Belongings sent with son. Pt out of ED via stretcher with [facility] not in visible distress.

## 2023-09-07 NOTE — ED Triage Notes (Signed)
 Pt POV reporting multiple episodes of emesis and RUQ abd pain that radiates to back, began a few hours ago. Last BM this AM.

## 2023-09-07 NOTE — Progress Notes (Signed)
 Heparin  gtt stopped per pharmacy order after critical aPTT result.  Per pharmacist order, Heparin  gtt will resume @ 2045 at a lower rate (see orders). No s/s of bleeding. Actions explained to the patient and her family. Questions answered.

## 2023-09-07 NOTE — Progress Notes (Signed)
 PHARMACY - ANTICOAGULATION CONSULT NOTE  Pharmacy Consult for heparin   Indication: atrial fibrillation  Allergies  Allergen Reactions   Amoxicillin-Pot Clavulanate Other (See Comments)    unknown   Amoxicillin Other (See Comments)    unknown   Clarithromycin Other (See Comments)    unknown    Patient Measurements: Height: 5' 1 (154.9 cm) Weight: 58.1 kg (128 lb 1.4 oz) IBW/kg (Calculated) : 47.8 HEPARIN  DW (KG): 58.1  Vital Signs: Temp: 99.2 F (37.3 C) (07/30 0632) Temp Source: Oral (07/30 9367) BP: 152/73 (07/30 9367) Pulse Rate: 99 (07/30 0632)  Labs: Recent Labs    09/07/23 0029  HGB 13.2  HCT 40.9  PLT 133*  CREATININE 1.46*    Estimated Creatinine Clearance: 22.7 mL/min (A) (by C-G formula based on SCr of 1.46 mg/dL (H)).   Medical History: Past Medical History:  Diagnosis Date   Arthritis    Depression    Diabetes mellitus without complication (HCC)    Borderline   Diastolic CHF (HCC) 03/03/2017   Echo 11/2016, nl EF but LVH and diastolic dysfunction   GERD (gastroesophageal reflux disease)    Hyperlipidemia    Hypertension    Peripheral arterial disease (HCC) 2018   thromboembolism of femoral artery   Thyroid  disease    subclinical hyperthyroidism managed by endocrinology    Medications:  Medications Prior to Admission  Medication Sig Dispense Refill Last Dose/Taking   Acetaminophen  500 MG coapsule Take 1,000 mg by mouth every 6 (six) hours as needed for pain.      apixaban  (ELIQUIS ) 2.5 MG TABS tablet Take 1 tablet (2.5 mg total) by mouth 2 (two) times daily. 180 tablet 3    Calcium  Carb-Cholecalciferol 600-800 MG-UNIT CHEW Chew 1 each by mouth daily.       carvedilol  (COREG ) 12.5 MG tablet Take 1 tablet (12.5 mg total) by mouth 2 (two) times daily. 180 tablet 3    cycloSPORINE (RESTASIS) 0.05 % ophthalmic emulsion 1 drop 2 (two) times daily.      diclofenac  sodium (VOLTAREN ) 1 % GEL APPLY 2 G TOPICALLY 4 (FOUR) TIMES DAILY AS NEEDED. **PA  DENIED** (Patient taking differently: Apply 2 g topically daily as needed (For pain).) 200 g 1    diltiazem  (CARDIZEM  CD) 180 MG 24 hr capsule TAKE 1 CAPSULE (180 MG TOTAL) BY MOUTH EVERY MORNING 90 capsule 3    empagliflozin  (JARDIANCE ) 10 MG TABS tablet Take 1 tablet (10 mg total) by mouth daily. 90 tablet 3    Glucosamine-MSM-Hyaluronic Acd (JOINT HEALTH PO) Take 1 tablet by mouth daily.      glucose blood (ONETOUCH ULTRA) test strip USE TWICE DAILY TO CHECK BLOOD SUGAR - E11.40 100 strip 5    Krill Oil 500 MG CAPS Take 500 mg by mouth daily.      Multiple Vitamins-Minerals (MULTIVITAMIN ADULT PO) Take by mouth.      omeprazole  (PRILOSEC) 20 MG capsule Take 20 mg by mouth daily.      rosuvastatin  (CRESTOR ) 40 MG tablet Take 1 tablet (40 mg total) by mouth daily. 90 tablet 3    vortioxetine  HBr (TRINTELLIX ) 10 MG TABS tablet Take 1 tablet (10 mg total) by mouth daily. 90 tablet 3     Assessment: Pharmacy consulted to manage heparin  bridge therapy for Afib in 89 yo F on apixaban  PTA. Apixaban  2.5 bid PTA last dose - 7/29 evening dose around 9 pm per pt report to RN PMH thrombocytopenia, CKD3b, Afib, HTN, HLD, DM2.  CBC: Hg 13.2, PLT  133 (hx thrombocytopenia SCr 1.46, hx CKD3b  Goal of Therapy:  Heparin  level 0.3-0.7 units/ml aPTT 66-102 seconds Monitor platelets by anticoagulation protocol: Yes   Plan:  Draw baseline aPTT & heparin  level now No bolus since recent DOAC Start Heparin  drip @ 850 units/hr Check 8 hour aPTT & heparin  level Daily CBC, heparin  level & aPTT F/u plans for cholecystectomy  Rosaline IVAR Edison, Pharm.D Use secure chat for questions 09/07/2023 7:44 AM

## 2023-09-07 NOTE — Consult Note (Signed)
 [facility] Gastroenterology Consult  Referring Provider: No ref. provider found Primary Care Physician:  Jodie Lavern CROME, MD Primary Gastroenterologist: [place]  Reason for Consultation: Gallstone pancreatitis  SUBJECTIVE:   HPI: Nicole Jimenez is a 89 y.o. female with medical history significant for GERD, hypertension, diabetes mellitus.  Had relatively sudden onset of right-sided abdominal pain associated with nausea and emesis yesterday evening.  Symptoms had presented similarly roughly 3 weeks prior and spontaneously resolved.  Associated chills, no fever.  No chest pain or shortness of breath.  Noted history of DVT, she is on Eliquis .  No metal in body.  No history of gastric bypass.  Past Medical History:  Diagnosis Date   Arthritis    Depression    Diabetes mellitus without complication (HCC)    Borderline   Diastolic CHF (HCC) 03/03/2017   Echo 11/2016, nl EF but LVH and diastolic dysfunction   GERD (gastroesophageal reflux disease)    Hyperlipidemia    Hypertension    Peripheral arterial disease (HCC) 2018   thromboembolism of femoral artery   Thyroid  disease    subclinical hyperthyroidism managed by endocrinology   Past Surgical History:  Procedure Laterality Date   FEMORAL ENDARTERECTOMY Right 11/29/2016   Prior to Admission medications   Medication Sig Start Date End Date Taking? Authorizing Provider  apixaban  (ELIQUIS ) 2.5 MG TABS tablet Take 1 tablet (2.5 mg total) by mouth 2 (two) times daily. 12/28/22  Yes Jodie Lavern CROME, MD  Calcium  Carb-Cholecalciferol 600-800 MG-UNIT CHEW Chew 2 each by mouth daily.   Yes [provider]  carvedilol  (COREG ) 12.5 MG tablet Take 1 tablet (12.5 mg total) by mouth 2 (two) times daily. 03/01/23 09/07/23 Yes Walker, Caitlin S, NP  cycloSPORINE (RESTASIS) 0.05 % ophthalmic emulsion Place 1 drop into both eyes 2 (two) times daily.   Yes [provider]  diclofenac  sodium (VOLTAREN ) 1 % GEL APPLY 2 G TOPICALLY 4 (FOUR)  TIMES DAILY AS NEEDED. **PA DENIED** Patient taking differently: Apply 1 Application topically daily as needed (For pain). 11/01/17  Yes Jodie Lavern CROME, MD  diltiazem  (CARDIZEM  CD) 180 MG 24 hr capsule TAKE 1 CAPSULE (180 MG TOTAL) BY MOUTH EVERY MORNING 02/12/23  Yes Jodie Lavern CROME, MD  empagliflozin  (JARDIANCE ) 10 MG TABS tablet Take 1 tablet (10 mg total) by mouth daily. 07/27/23  Yes Jodie Lavern CROME, MD  Glucosamine-MSM-Hyaluronic Acd (JOINT HEALTH PO) Take 2 tablets by mouth daily.   Yes [provider]  Anselm Oil 500 MG CAPS Take 500 mg by mouth daily.   Yes [provider]  Multiple Vitamins-Minerals (MULTIVITAMIN ADULT PO) Take 1 tablet by mouth daily.   Yes [provider]  omeprazole  (PRILOSEC) 20 MG capsule Take 20 mg by mouth daily. 03/12/23  Yes [provider]  rosuvastatin  (CRESTOR ) 40 MG tablet Take 1 tablet (40 mg total) by mouth daily. 11/01/22  Yes Jodie Lavern CROME, MD  vortioxetine  HBr (TRINTELLIX ) 10 MG TABS tablet Take 1 tablet (10 mg total) by mouth daily. 12/28/22  Yes Jodie Lavern CROME, MD  glucose blood (ONETOUCH ULTRA) test strip USE TWICE DAILY TO CHECK BLOOD SUGAR - E11.40 12/27/22   Jodie Lavern CROME, MD   Current Facility-Administered Medications  Medication Dose Route Frequency Provider Last Rate Last Admin   carvedilol  (COREG ) tablet 12.5 mg  12.5 mg Oral BID Kakrakandy, Arshad N, MD       diltiazem  (CARDIZEM  CD) 24 hr capsule 180 mg  180 mg Oral q morning Franky Redia SAILOR,  MD   180 mg at 09/07/23 1011   fentaNYL  (SUBLIMAZE ) injection 50 mcg  50 mcg Intravenous Q4H PRN Franky Redia SAILOR, MD       heparin  ADULT infusion 100 units/mL (25000 units/250mL)  850 Units/hr Intravenous Continuous Carolee Rosaline DASEN, RPH 8.5 mL/hr at 09/07/23 1010 850 Units/hr at 09/07/23 1010   hydrALAZINE  (APRESOLINE ) injection 10 mg  10 mg Intravenous Q6H PRN Vernon Ranks, MD       insulin  aspart (novoLOG ) injection 0-6 Units  0-6 Units Subcutaneous Q4H  Franky Redia SAILOR, MD   1 Units at 09/07/23 1226   lactated ringers  infusion   Intravenous Continuous Mesner, Selinda, MD 100 mL/hr at 09/07/23 1215 New Bag at 09/07/23 1215   Allergies as of 09/07/2023 - Review Complete 09/07/2023  Allergen Reaction Noted   Amoxicillin-pot clavulanate Other (See Comments) 11/28/2016   Amoxicillin Other (See Comments) 09/04/2013   Clarithromycin Other (See Comments) 09/04/2013   Family History  Problem Relation Age of Onset   Heart attack Mother    Arthritis Mother    Diabetes Father    Heart attack Father    Hyperlipidemia Father    Arthritis Sister    Diabetes Sister        adopted   Healthy Daughter    Healthy Son    Healthy Son    Other Son        cyst on thyroid    Social History   Socioeconomic History   Marital status: Widowed    Spouse name: Not on file   Number of children: Not on file   Years of education: Not on file   Highest education level: 12th grade  Occupational History   Occupation: retired aesthician  Tobacco Use   Smoking status: Never   Smokeless tobacco: Never  Vaping Use   Vaping status: Never Used  Substance and Sexual Activity   Alcohol use: No   Drug use: No   Sexual activity: Never  Other Topics Concern   Not on file  Social History Narrative   Not on file   Social Drivers of Health   Financial Resource Strain: Low Risk  (06/30/2023)   Overall Financial Resource Strain (CARDIA)    Difficulty of Paying Living Expenses: Not hard at all  Food Insecurity: No Food Insecurity (09/07/2023)   Hunger Vital Sign    Worried About Running Out of Food in the Last Year: Never true    Ran Out of Food in the Last Year: Never true  Transportation Needs: No Transportation Needs (09/07/2023)   PRAPARE - Administrator, Civil Service (Medical): No    Lack of Transportation (Non-Medical): No  Physical Activity: Unknown (08/22/2023)   Exercise Vital Sign    Days of Exercise per Week: Patient declined     Minutes of Exercise per Session: Not on file  Recent Concern: Physical Activity - Inactive (06/30/2023)   Exercise Vital Sign    Days of Exercise per Week: 0 days    Minutes of Exercise per Session: 0 min  Stress: Stress Concern Present (08/22/2023)   [employer] of Occupational Health - Occupational Stress Questionnaire    Feeling of Stress: To some extent  Social Connections: Moderately Integrated (09/07/2023)   Social Connection and Isolation Panel    Frequency of Communication with Friends and Family: Three times a week    Frequency of Social Gatherings with Friends and Family: Three times a week    Attends Religious Services: More than 4 times  per year    Active Member of Clubs or Organizations: Yes    Attends Banker Meetings: More than 4 times per year    Marital Status: Widowed  Intimate Partner Violence: Not At Risk (09/07/2023)   Humiliation, Afraid, Rape, and Kick questionnaire    Fear of Current or Ex-Partner: No    Emotionally Abused: No    Physically Abused: No    Sexually Abused: No   Review of Systems:  Review of Systems  Respiratory:  Negative for shortness of breath.   Cardiovascular:  Negative for chest pain.  Gastrointestinal:  Positive for abdominal pain, nausea and vomiting.    OBJECTIVE:   Temp:  [97.8 F (36.6 C)-99.2 F (37.3 C)] 99.1 F (37.3 C) (07/30 1314) Pulse Rate:  [56-101] 75 (07/30 1314) Resp:  [11-22] 16 (07/30 1314) BP: (86-171)/(52-110) 86/52 (07/30 1314) SpO2:  [85 %-98 %] 96 % (07/30 1314) Weight:  [58.1 kg] 58.1 kg (07/30 0443) Last BM Date : 09/06/23 Physical Exam Constitutional:      General: She is not in acute distress.    Appearance: She is not ill-appearing, toxic-appearing or diaphoretic.  Cardiovascular:     Rate and Rhythm: Normal rate. Rhythm irregular.  Pulmonary:     Effort: No respiratory distress.     Breath sounds: Normal breath sounds.  Abdominal:     General: Bowel sounds are normal. There is no  distension.     Palpations: Abdomen is soft.     Tenderness: There is no abdominal tenderness. There is no guarding.  Neurological:     Mental Status: She is alert.     Labs: Recent Labs    09/07/23 0029 09/07/23 0734  WBC 8.7 6.9  HGB 13.2 12.3  HCT 40.9 39.2  PLT 133* 102*   BMET Recent Labs    09/07/23 0029 09/07/23 0734  NA 138 136  K 3.5 3.3*  CL 101 104  CO2 23 24  GLUCOSE 169* 137*  BUN 34* 29*  CREATININE 1.46* 1.18*  CALCIUM  9.4 8.8*   LFT Recent Labs    09/07/23 0734  PROT 7.3  ALBUMIN 3.3*  AST 1,971*  ALT 831*  ALKPHOS 175*  BILITOT 2.6*  BILIDIR 1.6*  IBILI 1.0*   PT/INR No results for input(s): LABPROT, INR in the last 72 hours.  Diagnostic imaging: US  Abdomen Limited RUQ (LIVER/GB) Result Date: 09/07/2023 CLINICAL DATA:  89 year old female with abdominal pain. Cholelithiasis on CT Abdomen and Pelvis today. EXAM: ULTRASOUND ABDOMEN LIMITED RIGHT UPPER QUADRANT COMPARISON:  Noncontrast CT Abdomen and Pelvis 0036 hours. Previous CT Abdomen and Pelvis 04/14/2022 FINDINGS: Gallbladder: Abnormal gallbladder wall thickening and heterogeneity up to 10 mm on image 7. Incompletely distended gallbladder. Evidence of small nonshadowing echogenic stones (images 8 and 13). Individual stone size estimated up to 8 mm. However, no sonographic Murphy sign elicited. Common bile duct: Diameter: Dilated up to 8 mm, concordant with CT today (image 16). New from 2024 CT. Possible sludge within the duct. No stone identified within the visible duct. Liver: Mild if any intrahepatic biliary ductal dilatation. No discrete liver lesion. Background echogenicity within normal limits. But the liver contour does appear mildly nodular (image 33). Portal vein is patent on color Doppler imaging with normal direction of blood flow towards the liver. Other: Negative visible right kidney. No right upper quadrant free fluid. IMPRESSION: 1. Small gallstones with new CBD enlargement since  2024, suspicious for Choledocholithiasis. 2. Furthermore, there is Abnormal Gallbladder Wall Thickening and heterogeneity.  However, no sonographic Murphy sign elicited to strongly indicate acute cholecystitis. 3. Mildly nodular liver contour, early Cirrhosis not excluded. Electronically Signed   By: VEAR Hurst M.D.   On: 09/07/2023 06:21   CT Renal Stone Study Result Date: 09/07/2023 CLINICAL DATA:  Right upper quadrant abdominal pain, flank pain EXAM: CT ABDOMEN AND PELVIS WITHOUT CONTRAST TECHNIQUE: Multidetector CT imaging of the abdomen and pelvis was performed following the standard protocol without IV contrast. RADIATION DOSE REDUCTION: This exam was performed according to the departmental dose-optimization program which includes automated exposure control, adjustment of the mA and/or kV according to patient size and/or use of iterative reconstruction technique. COMPARISON:  05/09/2022 FINDINGS: Lower chest: No acute abnormality.  Cardiomegaly. Hepatobiliary: Cholelithiasis without superimposed pericholecystic inflammatory change. Liver unremarkable on this noncontrast examination. No intra or extrahepatic biliary ductal dilation. Pancreas: Unremarkable Spleen: Unremarkable Adrenals/Urinary Tract: Adrenal glands are unremarkable. Kidneys are normal, without renal calculi, focal lesion, or hydronephrosis. Bladder is unremarkable. Stomach/Bowel: Stomach is within normal limits. Appendix appears normal. No evidence of bowel wall thickening, distention, or inflammatory changes. Vascular/Lymphatic: Aortic atherosclerosis. No enlarged abdominal or pelvic lymph nodes. Reproductive: Multiple calcified involuted uterine fibroids are seen within the uterus. The pelvic organs are otherwise unremarkable. Other: No abdominal wall hernia or abnormality. No abdominopelvic ascites. Musculoskeletal: No acute bone abnormality. No lytic or blastic bone lesion. Osseous structures are age appropriate. IMPRESSION: 1. No acute  intra-abdominal pathology identified. No urolithiasis. 2. Cholelithiasis. 3. Cardiomegaly. Aortic Atherosclerosis (ICD10-I70.0). Electronically Signed   By: Dorethia Molt M.D.   On: 09/07/2023 01:12   IMPRESSION: Gallstone pancreatitis Dilated bile duct on imaging Transaminase elevation in mixed pattern Abdominal pain secondary to above  PLAN: -Check MRCP -General Surgery evaluation -N.p.o. -[facility] will follow   LOS: 0 days   Estefana Keas, [facility] Gastroenterology

## 2023-09-07 NOTE — Plan of Care (Signed)
  Problem: Education: Goal: Knowledge of General Education information will improve Description: Including pain rating scale, medication(s)/side effects and non-pharmacologic comfort measures Outcome: Progressing   Problem: Health Behavior/Discharge Planning: Goal: Ability to manage health-related needs will improve Outcome: Progressing   Problem: Clinical Measurements: Goal: Ability to maintain clinical measurements within normal limits will improve Outcome: Progressing Goal: Will remain free from infection Outcome: Progressing Goal: Diagnostic test results will improve Outcome: Progressing Goal: Respiratory complications will improve Outcome: Progressing Goal: Cardiovascular complication will be avoided Outcome: Progressing   Problem: Activity: Goal: Risk for activity intolerance will decrease Outcome: Progressing   Problem: Nutrition: Goal: Adequate nutrition will be maintained Outcome: Progressing   Problem: Coping: Goal: Level of anxiety will decrease Outcome: Progressing   Problem: Elimination: Goal: Will not experience complications related to bowel motility Outcome: Progressing Goal: Will not experience complications related to urinary retention Outcome: Completed/Met   Problem: Pain Managment: Goal: General experience of comfort will improve and/or be controlled Outcome: Progressing   Problem: Safety: Goal: Ability to remain free from injury will improve Outcome: Progressing   Problem: Skin Integrity: Goal: Risk for impaired skin integrity will decrease Outcome: Progressing

## 2023-09-08 ENCOUNTER — Inpatient Hospital Stay (HOSPITAL_COMMUNITY)

## 2023-09-08 DIAGNOSIS — K859 Acute pancreatitis without necrosis or infection, unspecified: Secondary | ICD-10-CM | POA: Diagnosis not present

## 2023-09-08 LAB — COMPREHENSIVE METABOLIC PANEL WITH GFR
ALT: 588 U/L — ABNORMAL HIGH (ref 0–44)
AST: 751 U/L — ABNORMAL HIGH (ref 15–41)
Albumin: 3 g/dL — ABNORMAL LOW (ref 3.5–5.0)
Alkaline Phosphatase: 162 U/L — ABNORMAL HIGH (ref 38–126)
Anion gap: 12 (ref 5–15)
BUN: 24 mg/dL — ABNORMAL HIGH (ref 8–23)
CO2: 20 mmol/L — ABNORMAL LOW (ref 22–32)
Calcium: 8.5 mg/dL — ABNORMAL LOW (ref 8.9–10.3)
Chloride: 103 mmol/L (ref 98–111)
Creatinine, Ser: 0.95 mg/dL (ref 0.44–1.00)
GFR, Estimated: 58 mL/min — ABNORMAL LOW (ref >60)
Glucose, Bld: 100 mg/dL — ABNORMAL HIGH (ref 70–99)
Potassium: 2.9 mmol/L — ABNORMAL LOW (ref 3.5–5.1)
Sodium: 135 mmol/L (ref 135–145)
Total Bilirubin: 5.2 mg/dL — ABNORMAL HIGH (ref 0.0–1.2)
Total Protein: 7.1 g/dL (ref 6.5–8.1)

## 2023-09-08 LAB — APTT
aPTT: >200 s (ref 24–36)
aPTT: 86 s — ABNORMAL HIGH (ref 24–36)

## 2023-09-08 LAB — GLUCOSE, CAPILLARY
Glucose-Capillary: 206 mg/dL — ABNORMAL HIGH (ref 70–99)
Glucose-Capillary: 75 mg/dL (ref 70–99)
Glucose-Capillary: 85 mg/dL (ref 70–99)
Glucose-Capillary: 92 mg/dL (ref 70–99)
Glucose-Capillary: 92 mg/dL (ref 70–99)
Glucose-Capillary: 96 mg/dL (ref 70–99)

## 2023-09-08 LAB — HEPARIN LEVEL (UNFRACTIONATED): Heparin Unfractionated: 0.8 [IU]/mL — ABNORMAL HIGH (ref 0.30–0.70)

## 2023-09-08 LAB — CBC
HCT: 37.7 % (ref 36.0–46.0)
Hemoglobin: 12 g/dL (ref 12.0–15.0)
MCH: 28.9 pg (ref 26.0–34.0)
MCHC: 31.8 g/dL (ref 30.0–36.0)
MCV: 90.8 fL (ref 80.0–100.0)
Platelets: 92 K/uL — ABNORMAL LOW (ref 150–400)
RBC: 4.15 MIL/uL (ref 3.87–5.11)
RDW: 14 % (ref 11.5–15.5)
WBC: 7.2 K/uL (ref 4.0–10.5)
nRBC: 0 % (ref 0.0–0.2)

## 2023-09-08 MED ORDER — SODIUM CHLORIDE 0.9 % IV SOLN: INTRAVENOUS | Status: AC | PRN

## 2023-09-08 MED ORDER — HEPARIN (PORCINE) 25000 UT/250ML-% IV SOLN
500.0000 [IU]/h | INTRAVENOUS | Status: DC
  Administered 2023-09-09: 500 [IU]/h via INTRAVENOUS
  Filled 2023-09-08: qty 250

## 2023-09-08 MED ORDER — POTASSIUM CHLORIDE 20 MEQ PO PACK
40.0000 meq | PACK | ORAL | Status: AC
  Administered 2023-09-08 (×3): 40 meq via ORAL
  Filled 2023-09-08 (×3): qty 2

## 2023-09-08 NOTE — Progress Notes (Signed)
 Progress Note     Subjective: Patient reports flatulence. Had a bowel movement yesterday. Has not had one today. Denies abdominal pain, nausea, vomiting.   ROS  All negative with the exception of above.  Objective: Vital signs in last 24 hours: Temp:  [98 F (36.7 C)-99.1 F (37.3 C)] 98.1 F (36.7 C) (07/31 0417) Pulse Rate:  [70-90] 70 (07/31 0417) Resp:  [16-18] 18 (07/31 0417) BP: (86-140)/(39-76) 140/61 (07/31 0417) SpO2:  [92 %-98 %] 97 % (07/31 0417) Last BM Date : 09/06/23  Intake/Output from previous day: 07/30 0701 - 07/31 0700 In: 1272.8 [I.V.:1028; IV Piggyback:244.8] Out: -  Intake/Output this shift: Total I/O In: 10 [I.V.:10] Out: -   PE: General: Pleasant, female who is laying in bed in NAD. HEENT: Sclera are noninjected. Conjunctiva anicteric.  Lungs: Respiratory effort nonlabored Abd: Soft, NT, ND. No guarding or rebound tenderness. Psych: A&Ox3 with an appropriate affect.    Lab Results:  Recent Labs    09/07/23 0734 09/08/23 0444  WBC 6.9 7.2  HGB 12.3 12.0  HCT 39.2 37.7  PLT 102* 92*   BMET Recent Labs    09/07/23 0734 09/08/23 0444  NA 136 135  K 3.3* 2.9*  CL 104 103  CO2 24 20*  GLUCOSE 137* 100*  BUN 29* 24*  CREATININE 1.18* 0.95  CALCIUM  8.8* 8.5*   PT/INR No results for input(s): LABPROT, INR in the last 72 hours. CMP     Component Value Date/Time   NA 135 09/08/2023 0444   K 2.9 (L) 09/08/2023 0444   CL 103 09/08/2023 0444   CO2 20 (L) 09/08/2023 0444   GLUCOSE 100 (H) 09/08/2023 0444   BUN 24 (H) 09/08/2023 0444   CREATININE 0.95 09/08/2023 0444   CREATININE 0.95 (H) 01/24/2017 1621   CALCIUM  8.5 (L) 09/08/2023 0444   PROT 7.1 09/08/2023 0444   ALBUMIN 3.0 (L) 09/08/2023 0444   AST 751 (H) 09/08/2023 0444   ALT 588 (H) 09/08/2023 0444   ALKPHOS 162 (H) 09/08/2023 0444   BILITOT 5.2 (H) 09/08/2023 0444   GFRNONAA 58 (L) 09/08/2023 0444   Lipase     Component Value Date/Time   LIPASE >2,800 (H)  09/07/2023 0029       Studies/Results: US  Abdomen Limited RUQ (LIVER/GB) Result Date: 09/07/2023 CLINICAL DATA:  89 year old female with abdominal pain. Cholelithiasis on CT Abdomen and Pelvis today. EXAM: ULTRASOUND ABDOMEN LIMITED RIGHT UPPER QUADRANT COMPARISON:  Noncontrast CT Abdomen and Pelvis 0036 hours. Previous CT Abdomen and Pelvis 04/14/2022 FINDINGS: Gallbladder: Abnormal gallbladder wall thickening and heterogeneity up to 10 mm on image 7. Incompletely distended gallbladder. Evidence of small nonshadowing echogenic stones (images 8 and 13). Individual stone size estimated up to 8 mm. However, no sonographic Murphy sign elicited. Common bile duct: Diameter: Dilated up to 8 mm, concordant with CT today (image 16). New from 2024 CT. Possible sludge within the duct. No stone identified within the visible duct. Liver: Mild if any intrahepatic biliary ductal dilatation. No discrete liver lesion. Background echogenicity within normal limits. But the liver contour does appear mildly nodular (image 33). Portal vein is patent on color Doppler imaging with normal direction of blood flow towards the liver. Other: Negative visible right kidney. No right upper quadrant free fluid. IMPRESSION: 1. Small gallstones with new CBD enlargement since 2024, suspicious for Choledocholithiasis. 2. Furthermore, there is Abnormal Gallbladder Wall Thickening and heterogeneity. However, no sonographic Murphy sign elicited to strongly indicate acute cholecystitis. 3. Mildly nodular liver  contour, early Cirrhosis not excluded. Electronically Signed   By: VEAR Hurst M.D.   On: 09/07/2023 06:21   CT Renal Stone Study Result Date: 09/07/2023 CLINICAL DATA:  Right upper quadrant abdominal pain, flank pain EXAM: CT ABDOMEN AND PELVIS WITHOUT CONTRAST TECHNIQUE: Multidetector CT imaging of the abdomen and pelvis was performed following the standard protocol without IV contrast. RADIATION DOSE REDUCTION: This exam was performed  according to the departmental dose-optimization program which includes automated exposure control, adjustment of the mA and/or kV according to patient size and/or use of iterative reconstruction technique. COMPARISON:  05/09/2022 FINDINGS: Lower chest: No acute abnormality.  Cardiomegaly. Hepatobiliary: Cholelithiasis without superimposed pericholecystic inflammatory change. Liver unremarkable on this noncontrast examination. No intra or extrahepatic biliary ductal dilation. Pancreas: Unremarkable Spleen: Unremarkable Adrenals/Urinary Tract: Adrenal glands are unremarkable. Kidneys are normal, without renal calculi, focal lesion, or hydronephrosis. Bladder is unremarkable. Stomach/Bowel: Stomach is within normal limits. Appendix appears normal. No evidence of bowel wall thickening, distention, or inflammatory changes. Vascular/Lymphatic: Aortic atherosclerosis. No enlarged abdominal or pelvic lymph nodes. Reproductive: Multiple calcified involuted uterine fibroids are seen within the uterus. The pelvic organs are otherwise unremarkable. Other: No abdominal wall hernia or abnormality. No abdominopelvic ascites. Musculoskeletal: No acute bone abnormality. No lytic or blastic bone lesion. Osseous structures are age appropriate. IMPRESSION: 1. No acute intra-abdominal pathology identified. No urolithiasis. 2. Cholelithiasis. 3. Cardiomegaly. Aortic Atherosclerosis (ICD10-I70.0). Electronically Signed   By: Dorethia Molt M.D.   On: 09/07/2023 01:12    Anti-infectives: Anti-infectives (From admission, onward)    None        Assessment/Plan Acute biliary pancreatitis  - CT stone study from 09/07/2023 showed cholelithiasis without superimposed pericholecystic inflammatory change. No intra or extrahepatic biliary ductal dilation. - Concern for possible choledocholithiasis on US  - MRCP completed and reviewed. Pending final report - Lipase >2800 on admit - WBC 7.2 - AST 751 from 1,971; ALT 588 from 831;  Alkaline Phosphatase 162 from 175; Total bilirubin 5.2 from 2.6 - Surgery will continue to follow and assess readiness for cholecystectomy pending clinical progress and patient's hesitance with surgery.     FEN: NPO, 0.9% sodium chloride  infusion at 0-10 mL/hr for IV medications VTE: Heparin  infusion ID: No current abx   - per TRH -  HTN HLD Chronic diastolic CHF Atrial fibrillation CKD stage III T2DM GERD PAD Hypothyroidism    LOS: 1 day   I reviewed hospitalist notes, last 24 h vitals and pain scores, last 48 h intake and output, last 24 h labs and trends, and last 24 h imaging results.  This care required moderate level of medical decision making.    Marjorie Carlyon Favre, [facility] Surgery 09/08/2023, 9:55 AM Please see Amion for pager number during day hours 7:00am-4:30pm

## 2023-09-08 NOTE — Progress Notes (Signed)
 PHARMACY - ANTICOAGULATION CONSULT NOTE  Pharmacy Consult for heparin   Indication: atrial fibrillation  Allergies  Allergen Reactions   Amoxicillin-Pot Clavulanate Other (See Comments)    unknown   Amoxicillin Other (See Comments)    unknown   Clarithromycin Other (See Comments)    unknown    Patient Measurements: Height: 5' 1 (154.9 cm) Weight: 58.1 kg (128 lb 1.4 oz) IBW/kg (Calculated) : 47.8 HEPARIN  DW (KG): 58.1  Vital Signs: Temp: 98.6 F (37 C) (07/31 1334) Temp Source: Oral (07/31 1334) BP: 131/79 (07/31 1334) Pulse Rate: 86 (07/31 1334)  Labs: Recent Labs    09/07/23 0029 09/07/23 0029 09/07/23 0734 09/07/23 1753 09/08/23 0444 09/08/23 1900  HGB 13.2  --  12.3  --  12.0  --   HCT 40.9  --  39.2  --  37.7  --   PLT 133*  --  102*  --  92*  --   APTT  --    < > 38* >200* >200* 86*  HEPARINUNFRC  --   --  >1.10*  --  0.80*  --   CREATININE 1.46*  --  1.18*  --  0.95  --    < > = values in this interval not displayed.    Estimated Creatinine Clearance: 34.8 mL/min (by C-G formula based on SCr of 0.95 mg/dL).  Assessment: Pharmacy consulted to manage heparin  bridge therapy for Afib in 89 yo F on apixaban  2.5 mg BID PTA; last dose - 7/29 evening around 9 pm. PMH thrombocytopenia, CKD3b, Afib, HTN, HLD, DM2. Baseline aPTT 38, baseline HL falsely elevated d/t recent DOAC use.  09/08/2023 aPTT now therapeutic after heparin  held x 1 hour and rate reduced to 500 units/hr No bleeding, complications, or IV site issues reported by RN.  Hg 12 - stable PLT 92 - low but stable (hx thrombocytopenia)   Goal of Therapy:  Heparin  level 0.3-0.7 units/ml aPTT 66-102 seconds Monitor platelets by anticoagulation protocol: Yes   Plan:  Continue heparin  IV infusion at 500 units/hr Check confirmatory aPTT with AM labs Daily CBC, heparin  level; aPTTs as needed while DOAC effects persist F/u plans for MRCP, cholecystectomy   Bard Jeans, PharmD,  BCPS [phone] 09/08/2023, 7:35 PM

## 2023-09-08 NOTE — H&P (View-Only) (Signed)
 [facility] Progress Note  SUBJECTIVE:   Interval history: Nicole Jimenez was seen and evaluated today at bedside. Resting in bed, no family at bedside. No abdominal pain. No nausea/vomiting. No chest pain or shortness of breath.   Past Medical History:  Diagnosis Date   Arthritis    Depression    Diabetes mellitus without complication (HCC)    Borderline   Diastolic CHF (HCC) 03/03/2017   Echo 11/2016, nl EF but LVH and diastolic dysfunction   GERD (gastroesophageal reflux disease)    Hyperlipidemia    Hypertension    Peripheral arterial disease (HCC) 2018   thromboembolism of femoral artery   Thyroid  disease    subclinical hyperthyroidism managed by endocrinology   Past Surgical History:  Procedure Laterality Date   FEMORAL ENDARTERECTOMY Right 11/29/2016   Current Facility-Administered Medications  Medication Dose Route Frequency Provider Last Rate Last Admin   0.9 %  sodium chloride  infusion   Intravenous PRN Carolee Rosaline DASEN, RPH 10 mL/hr at 09/08/23 1503 Infusion Verify at 09/08/23 1503   carvedilol  (COREG ) tablet 12.5 mg  12.5 mg Oral BID Franky Redia SAILOR, MD   12.5 mg at 09/08/23 1035   diltiazem  (CARDIZEM  CD) 24 hr capsule 180 mg  180 mg Oral q morning Franky Redia SAILOR, MD   180 mg at 09/08/23 1035   fentaNYL  (SUBLIMAZE ) injection 50 mcg  50 mcg Intravenous Q4H PRN Franky Redia SAILOR, MD       heparin  ADULT infusion 100 units/mL (25000 units/250mL)  500 Units/hr Intravenous Continuous Carolee Rosaline T, RPH 5 mL/hr at 09/08/23 1503 500 Units/hr at 09/08/23 1503   hydrALAZINE  (APRESOLINE ) injection 10 mg  10 mg Intravenous Q6H PRN Pahwani, Ravi, MD       insulin  aspart (novoLOG ) injection 0-6 Units  0-6 Units Subcutaneous Q4H Franky Redia SAILOR, MD   1 Units at 09/07/23 1226   potassium chloride  (KLOR-CON ) packet 40 mEq  40 mEq Oral Q4H Vernon Ranks, MD   40 mEq at 09/08/23 1408   Allergies as of 09/07/2023 - Review Complete 09/07/2023  Allergen  Reaction Noted   Amoxicillin-pot clavulanate Other (See Comments) 11/28/2016   Amoxicillin Other (See Comments) 09/04/2013   Clarithromycin Other (See Comments) 09/04/2013   Review of Systems:  Review of Systems  Respiratory:  Negative for shortness of breath.   Cardiovascular:  Negative for chest pain.  Gastrointestinal:  Negative for abdominal pain, nausea and vomiting.    OBJECTIVE:   Temp:  [98 F (36.7 C)-98.6 F (37 C)] 98.6 F (37 C) (07/31 1334) Pulse Rate:  [70-90] 86 (07/31 1334) Resp:  [16-18] 16 (07/31 1334) BP: (122-140)/(55-79) 131/79 (07/31 1334) SpO2:  [92 %-97 %] 96 % (07/31 1334) Last BM Date : 09/06/23 Physical Exam Constitutional:      General: She is not in acute distress.    Appearance: She is not ill-appearing, toxic-appearing or diaphoretic.  Cardiovascular:     Rate and Rhythm: Normal rate and regular rhythm.  Pulmonary:     Effort: No respiratory distress.     Breath sounds: Normal breath sounds.  Abdominal:     General: Bowel sounds are normal. There is no distension.     Palpations: Abdomen is soft.     Tenderness: There is no abdominal tenderness. There is no guarding.  Neurological:     Mental Status: She is alert.     Labs: Recent Labs    09/07/23 0029 09/07/23 0734 09/08/23 0444  WBC 8.7 6.9 7.2  HGB  13.2 12.3 12.0  HCT 40.9 39.2 37.7  PLT 133* 102* 92*   BMET Recent Labs    09/07/23 0029 09/07/23 0734 09/08/23 0444  NA 138 136 135  K 3.5 3.3* 2.9*  CL 101 104 103  CO2 23 24 20*  GLUCOSE 169* 137* 100*  BUN 34* 29* 24*  CREATININE 1.46* 1.18* 0.95  CALCIUM  9.4 8.8* 8.5*   LFT Recent Labs    09/07/23 0734 09/08/23 0444  PROT 7.3 7.1  ALBUMIN 3.3* 3.0*  AST 1,971* 751*  ALT 831* 588*  ALKPHOS 175* 162*  BILITOT 2.6* 5.2*  BILIDIR 1.6*  --   IBILI 1.0*  --    PT/INR No results for input(s): LABPROT, INR in the last 72 hours. Diagnostic imaging: MR ABDOMEN MRCP WO CONTRAST Result Date:  09/08/2023 CLINICAL DATA:  Rule out choledocholithiasis EXAM: MRI ABDOMEN WITHOUT CONTRAST  (INCLUDING MRCP) TECHNIQUE: Multiplanar multisequence MR imaging of the abdomen was performed. Heavily T2-weighted images of the biliary and pancreatic ducts were obtained, and three-dimensional MRCP images were rendered by post processing. COMPARISON:  Ultrasound abdomen September 07, 2023 small right and trace left FINDINGS: Lower chest: Small right and trace left pleural effusions. Cardiomegaly. Hepatobiliary/MRCP: Mildly nodular liver morphology. No significant focal in liver lesion. Liver measures 12.8 cm in craniocaudal dimension. No significant hepatic steatosis. Gallbladder demonstrate increased wall thickness. Layer of intraluminal tiny stones/sludge along the posterior wall. No pericholecystic fluid. Phrygian cap identified. Common bile duct measures up to 6.5 mm, normal for age. There are a few filling defects overlying distal CBD on some of the MRCP images for example image 11/5 which may represent intraluminal sludge versus tiny nonobstructive choledocholithiasis or represent artifact. Pancreas: No mass, inflammatory changes, or other parenchymal abnormality identified. No pancreas divisum. Pancreatic duct is normal. Spleen: Spleen measures 9.5 cm in length with normal signal intensity. Adrenals/Urinary Tract: Bilateral simple T2 hyperintense renal cortical cysts which does not require imaging follow-up. No hydronephrosis. Both adrenal glands are unremarkable. Stomach/Bowel: Visualized portions within the abdomen are unremarkable. Vascular/Lymphatic: No pathologically enlarged lymph nodes identified. No abdominal aortic aneurysm demonstrated. Other:  Small fat containing umbilical hernia partially visualized Musculoskeletal: No suspicious bone lesions identified. IMPRESSION: Gallbladder wall thickening with intraluminal layering stones/sludge. Prominent common bile duct with questionable nonobstructive intraluminal  filling defects in distal CBD, possibly sludge. Punctate choledocholithiasis cannot be excluded. Cardiomegaly. Bilateral pleural effusions. Bilateral renal cortical cysts which does not require imaging follow-up. Electronically Signed   By: Megan  Zare M.D.   On: 09/08/2023 12:30   MR 3D Recon At Scanner Result Date: 09/08/2023 CLINICAL DATA:  Rule out choledocholithiasis EXAM: MRI ABDOMEN WITHOUT CONTRAST  (INCLUDING MRCP) TECHNIQUE: Multiplanar multisequence MR imaging of the abdomen was performed. Heavily T2-weighted images of the biliary and pancreatic ducts were obtained, and three-dimensional MRCP images were rendered by post processing. COMPARISON:  Ultrasound abdomen September 07, 2023 small right and trace left FINDINGS: Lower chest: Small right and trace left pleural effusions. Cardiomegaly. Hepatobiliary/MRCP: Mildly nodular liver morphology. No significant focal in liver lesion. Liver measures 12.8 cm in craniocaudal dimension. No significant hepatic steatosis. Gallbladder demonstrate increased wall thickness. Layer of intraluminal tiny stones/sludge along the posterior wall. No pericholecystic fluid. Phrygian cap identified. Common bile duct measures up to 6.5 mm, normal for age. There are a few filling defects overlying distal CBD on some of the MRCP images for example image 11/5 which may represent intraluminal sludge versus tiny nonobstructive choledocholithiasis or represent artifact. Pancreas: No mass, inflammatory changes, or  other parenchymal abnormality identified. No pancreas divisum. Pancreatic duct is normal. Spleen: Spleen measures 9.5 cm in length with normal signal intensity. Adrenals/Urinary Tract: Bilateral simple T2 hyperintense renal cortical cysts which does not require imaging follow-up. No hydronephrosis. Both adrenal glands are unremarkable. Stomach/Bowel: Visualized portions within the abdomen are unremarkable. Vascular/Lymphatic: No pathologically enlarged lymph nodes identified.  No abdominal aortic aneurysm demonstrated. Other:  Small fat containing umbilical hernia partially visualized Musculoskeletal: No suspicious bone lesions identified. IMPRESSION: Gallbladder wall thickening with intraluminal layering stones/sludge. Prominent common bile duct with questionable nonobstructive intraluminal filling defects in distal CBD, possibly sludge. Punctate choledocholithiasis cannot be excluded. Cardiomegaly. Bilateral pleural effusions. Bilateral renal cortical cysts which does not require imaging follow-up. Electronically Signed   By: Megan  Zare M.D.   On: 09/08/2023 12:30   US  Abdomen Limited RUQ (LIVER/GB) Result Date: 09/07/2023 CLINICAL DATA:  89 year old female with abdominal pain. Cholelithiasis on CT Abdomen and Pelvis today. EXAM: ULTRASOUND ABDOMEN LIMITED RIGHT UPPER QUADRANT COMPARISON:  Noncontrast CT Abdomen and Pelvis 0036 hours. Previous CT Abdomen and Pelvis 04/14/2022 FINDINGS: Gallbladder: Abnormal gallbladder wall thickening and heterogeneity up to 10 mm on image 7. Incompletely distended gallbladder. Evidence of small nonshadowing echogenic stones (images 8 and 13). Individual stone size estimated up to 8 mm. However, no sonographic Murphy sign elicited. Common bile duct: Diameter: Dilated up to 8 mm, concordant with CT today (image 16). New from 2024 CT. Possible sludge within the duct. No stone identified within the visible duct. Liver: Mild if any intrahepatic biliary ductal dilatation. No discrete liver lesion. Background echogenicity within normal limits. But the liver contour does appear mildly nodular (image 33). Portal vein is patent on color Doppler imaging with normal direction of blood flow towards the liver. Other: Negative visible right kidney. No right upper quadrant free fluid. IMPRESSION: 1. Small gallstones with new CBD enlargement since 2024, suspicious for Choledocholithiasis. 2. Furthermore, there is Abnormal Gallbladder Wall Thickening and  heterogeneity. However, no sonographic Murphy sign elicited to strongly indicate acute cholecystitis. 3. Mildly nodular liver contour, early Cirrhosis not excluded. Electronically Signed   By: VEAR Hurst M.D.   On: 09/07/2023 06:21   CT Renal Stone Study Result Date: 09/07/2023 CLINICAL DATA:  Right upper quadrant abdominal pain, flank pain EXAM: CT ABDOMEN AND PELVIS WITHOUT CONTRAST TECHNIQUE: Multidetector CT imaging of the abdomen and pelvis was performed following the standard protocol without IV contrast. RADIATION DOSE REDUCTION: This exam was performed according to the departmental dose-optimization program which includes automated exposure control, adjustment of the mA and/or kV according to patient size and/or use of iterative reconstruction technique. COMPARISON:  05/09/2022 FINDINGS: Lower chest: No acute abnormality.  Cardiomegaly. Hepatobiliary: Cholelithiasis without superimposed pericholecystic inflammatory change. Liver unremarkable on this noncontrast examination. No intra or extrahepatic biliary ductal dilation. Pancreas: Unremarkable Spleen: Unremarkable Adrenals/Urinary Tract: Adrenal glands are unremarkable. Kidneys are normal, without renal calculi, focal lesion, or hydronephrosis. Bladder is unremarkable. Stomach/Bowel: Stomach is within normal limits. Appendix appears normal. No evidence of bowel wall thickening, distention, or inflammatory changes. Vascular/Lymphatic: Aortic atherosclerosis. No enlarged abdominal or pelvic lymph nodes. Reproductive: Multiple calcified involuted uterine fibroids are seen within the uterus. The pelvic organs are otherwise unremarkable. Other: No abdominal wall hernia or abnormality. No abdominopelvic ascites. Musculoskeletal: No acute bone abnormality. No lytic or blastic bone lesion. Osseous structures are age appropriate. IMPRESSION: 1. No acute intra-abdominal pathology identified. No urolithiasis. 2. Cholelithiasis. 3. Cardiomegaly. Aortic Atherosclerosis  (ICD10-I70.0). Electronically Signed   By: Dorethia Kimberlee HERO.D.  On: 09/07/2023 01:12   IMPRESSION: Gallstone pancreatitis Dilated bile duct on imaging, MRCP 09/08/23 with questionable choledocholithiasis Hyperbilirubinemia Transaminase elevation in mixed pattern Abdominal pain secondary to above, improved Atrial fibrillation on Eliquis , currently on heparin  infusion  PLAN: -Total bilirubin elevated today, AST/ALT improved, MRCP with questionable choledocholithiasis -Given rising bilirubin and possible choledocholithiasis on MRCP, would offer ERCP -Discussed ERCP with patient, she asked appropriate questions and we discussed benefits, alternatives and risks of bleeding/infection/perforation/pancreatitis/anesthesia -She remains somewhat apprehensive to any procedural intervention, though understands that if no action were taken she has increased risk of infection -Will place NPO at midnight, trend liver enzymes tomorrow -Will coordinate with Dr. Wilhelmenia schedule tomorrow for ERCP if needed/able -Will need to hold heparin  infusion for 6 hours prior to procedure    LOS: 1 day   Estefana Keas, DO [facility]

## 2023-09-08 NOTE — Progress Notes (Signed)
 PROGRESS NOTE    Nicole Jimenez  FMW:[id number] DOB: 08-28-1934 DOA: 09/07/2023 PCP: Jodie Lavern LITTIE, MD   Brief Narrative:  HPI: Nicole Jimenez is a 89 y.o. female with history of A-fib, hypertension, hyperlipidemia, chronic kidney disease stage III, diabetes mellitus type 2, thrombocytopenia was brought to the ER after patient started complaining of severe pain with abdominal pain radiating to her back and across the abdomen with nausea vomiting since last night.  Had a similar episode about a month ago which was self-limited.  Denies any diarrhea fever or chills.  Denies drinking alcohol.   ED Course: In the ER patient's labs show elevated lipase with mildly elevated AST ALT and total bilirubin of 1.4.  CT abdomen pelvis shows cholelithiasis and ultrasound abdomen shows which is concerning for choledocholithiasis.  Gallbladder thickening was present but no Murphy sign.  ER physician discussed the [facility] gastroenterologist.  Placed patient on IV fluids and pain relief medications admitted for acute pancreatitis likely secondary to gallstones.  Assessment & Plan:   Principal Problem:   Acute pancreatitis Active Problems:   Essential hypertension   Type 2 diabetes mellitus with peripheral neuropathy (HCC)   Permanent atrial fibrillation (HCC)   Thrombocytopenia (HCC)   Chronic kidney disease, stage 3b (HCC)  Acute gallstone pancreatitis/acute choledocholithiasis/elevated LFTs/possible acute cholecystitis, POA: Seen by GI as well as general surgery.  Due to rising LFTs and bilirubin, MRCP ordered which is pending.  General surgery does not suspect acute cholecystitis.  However patient will need cholecystectomy once medically ready.  Patient's LFTs are improving however bilirubin is rising.  Patient remains n.p.o. with sips with meds only.  No abdominal pain or tenderness.  Appreciate both GI and general surgery help.  Paroxysmal atrial fibrillation: Rates controlled, continue Coreg  and  Cardizem .  Hold Eliquis  for possible surgical intervention.  Continue heparin  in the meantime.  Essential hypertension: Controlled.  Continue Cardizem  and Coreg .  CKD stage IIIb/IV: GFR labile remaining in the range of stage IIIb and IV.  Currently at baseline.  Hyperlipidemia:  on statins but due to LFTs, will discontinue that for now.  Chronic thrombocytopenia: Stable, no signs of bleeding.  Follow CBC.  Type 2 diabetes mellitus: Takes Jardiance  at home.  Last hemoglobin A1c 6.5.  Continue SSI only.  DVT prophylaxis: Heparin    Code Status: Full Code  Family Communication:  None present at bedside.  Plan of care discussed with patient in length and he/she verbalized understanding and agreed with it.  Status is: Inpatient Remains inpatient appropriate because: Improving pancreatitis, may need ERCP and will need cholecystectomy this hospitalization.   Estimated body mass index is 24.2 kg/m as calculated from the following:   Height as of this encounter: 5' 1 (1.549 m).   Weight as of this encounter: 58.1 kg.    Nutritional Assessment: Body mass index is 24.2 kg/m.Nicole Jimenez Seen by dietician.  I agree with the assessment and plan as outlined below: Nutrition Status:        . Skin Assessment: I have examined the patient's skin and I agree with the wound assessment as performed by the wound care RN as outlined below:    Consultants:  GI and general surgery  Procedures:  As above  Antimicrobials:  Anti-infectives (From admission, onward)    None         Subjective: Patient seen and examined, just again, she denies any abdominal pain or any other complaint. Objective: Vitals:   09/07/23 1416 09/07/23 1917 09/07/23 2010 09/08/23 0417  BP: (!) 90/39 (!) 130/55 122/76 (!) 140/61  Pulse: 84 75 90 70  Resp:  17 18 18   Temp:   98 F (36.7 C) 98.1 F (36.7 C)  TempSrc:   Oral Oral  SpO2: 98% 95% 92% 97%  Weight:      Height:        Intake/Output Summary (Last 24  hours) at 09/08/2023 0832 Last data filed at 09/08/2023 0732 Gross per 24 hour  Intake 1282.75 ml  Output --  Net 1282.75 ml   Filed Weights   09/07/23 0021 09/07/23 0443  Weight: 58.1 kg 58.1 kg    Examination:  General exam: Appears calm and comfortable  Respiratory system: Clear to auscultation. Respiratory effort normal. Cardiovascular system: S1 & S2 heard, RRR. No JVD, murmurs, rubs, gallops or clicks. No pedal edema. Gastrointestinal system: Abdomen is nondistended, soft and nontender. No organomegaly or masses felt. Normal bowel sounds heard. Central nervous system: Alert and oriented. No focal neurological deficits. Extremities: Symmetric 5 x 5 power. Skin: No rashes, lesions or ulcers.  Psychiatry: Judgement and insight appear normal. Mood & affect appropriate.   Data Reviewed: I have personally reviewed following labs and imaging studies  CBC: Recent Labs  Lab 09/07/23 0029 09/07/23 0734 09/08/23 0444  WBC 8.7 6.9 7.2  NEUTROABS 6.9 6.2  --   HGB 13.2 12.3 12.0  HCT 40.9 39.2 37.7  MCV 88.7 89.5 90.8  PLT 133* 102* 92*   Basic Metabolic Panel: Recent Labs  Lab 09/07/23 0029 09/07/23 0734 09/08/23 0444  NA 138 136 135  K 3.5 3.3* 2.9*  CL 101 104 103  CO2 23 24 20*  GLUCOSE 169* 137* 100*  BUN 34* 29* 24*  CREATININE 1.46* 1.18* 0.95  CALCIUM  9.4 8.8* 8.5*   GFR: Estimated Creatinine Clearance: 34.8 mL/min (by C-G formula based on SCr of 0.95 mg/dL). Liver Function Tests: Recent Labs  Lab 09/07/23 0029 09/07/23 0734 09/08/23 0444  AST 146* 1,971* 751*  ALT 56* 831* 588*  ALKPHOS 167* 175* 162*  BILITOT 1.4* 2.6* 5.2*  PROT 7.9 7.3 7.1  ALBUMIN 4.2 3.3* 3.0*   Recent Labs  Lab 09/07/23 0029  LIPASE >2,800*   No results for input(s): AMMONIA in the last 168 hours. Coagulation Profile: No results for input(s): INR, PROTIME in the last 168 hours. Cardiac Enzymes: No results for input(s): CKTOTAL, CKMB, CKMBINDEX, TROPONINI  in the last 168 hours. BNP (last 3 results) No results for input(s): PROBNP in the last 8760 hours. HbA1C: No results for input(s): HGBA1C in the last 72 hours. CBG: Recent Labs  Lab 09/07/23 1609 09/07/23 2012 09/08/23 0009 09/08/23 0419 09/08/23 0709  GLUCAP 116* 101* 96 92 92   Lipid Profile: Recent Labs    09/07/23 0734  TRIG 38   Thyroid  Function Tests: No results for input(s): TSH, T4TOTAL, FREET4, T3FREE, THYROIDAB in the last 72 hours. Anemia Panel: No results for input(s): VITAMINB12, FOLATE, FERRITIN, TIBC, IRON, RETICCTPCT in the last 72 hours. Sepsis Labs: Recent Labs  Lab 09/07/23 1430  LATICACIDVEN 1.4    No results found for this or any previous visit (from the past 240 hours).   Radiology Studies: US  Abdomen Limited RUQ (LIVER/GB) Result Date: 09/07/2023 CLINICAL DATA:  89 year old female with abdominal pain. Cholelithiasis on CT Abdomen and Pelvis today. EXAM: ULTRASOUND ABDOMEN LIMITED RIGHT UPPER QUADRANT COMPARISON:  Noncontrast CT Abdomen and Pelvis 0036 hours. Previous CT Abdomen and Pelvis 04/14/2022 FINDINGS: Gallbladder: Abnormal gallbladder wall thickening and heterogeneity up to  10 mm on image 7. Incompletely distended gallbladder. Evidence of small nonshadowing echogenic stones (images 8 and 13). Individual stone size estimated up to 8 mm. However, no sonographic Murphy sign elicited. Common bile duct: Diameter: Dilated up to 8 mm, concordant with CT today (image 16). New from 2024 CT. Possible sludge within the duct. No stone identified within the visible duct. Liver: Mild if any intrahepatic biliary ductal dilatation. No discrete liver lesion. Background echogenicity within normal limits. But the liver contour does appear mildly nodular (image 33). Portal vein is patent on color Doppler imaging with normal direction of blood flow towards the liver. Other: Negative visible right kidney. No right upper quadrant free fluid.  IMPRESSION: 1. Small gallstones with new CBD enlargement since 2024, suspicious for Choledocholithiasis. 2. Furthermore, there is Abnormal Gallbladder Wall Thickening and heterogeneity. However, no sonographic Murphy sign elicited to strongly indicate acute cholecystitis. 3. Mildly nodular liver contour, early Cirrhosis not excluded. Electronically Signed   By: VEAR Hurst M.D.   On: 09/07/2023 06:21   CT Renal Stone Study Result Date: 09/07/2023 CLINICAL DATA:  Right upper quadrant abdominal pain, flank pain EXAM: CT ABDOMEN AND PELVIS WITHOUT CONTRAST TECHNIQUE: Multidetector CT imaging of the abdomen and pelvis was performed following the standard protocol without IV contrast. RADIATION DOSE REDUCTION: This exam was performed according to the departmental dose-optimization program which includes automated exposure control, adjustment of the mA and/or kV according to patient size and/or use of iterative reconstruction technique. COMPARISON:  05/09/2022 FINDINGS: Lower chest: No acute abnormality.  Cardiomegaly. Hepatobiliary: Cholelithiasis without superimposed pericholecystic inflammatory change. Liver unremarkable on this noncontrast examination. No intra or extrahepatic biliary ductal dilation. Pancreas: Unremarkable Spleen: Unremarkable Adrenals/Urinary Tract: Adrenal glands are unremarkable. Kidneys are normal, without renal calculi, focal lesion, or hydronephrosis. Bladder is unremarkable. Stomach/Bowel: Stomach is within normal limits. Appendix appears normal. No evidence of bowel wall thickening, distention, or inflammatory changes. Vascular/Lymphatic: Aortic atherosclerosis. No enlarged abdominal or pelvic lymph nodes. Reproductive: Multiple calcified involuted uterine fibroids are seen within the uterus. The pelvic organs are otherwise unremarkable. Other: No abdominal wall hernia or abnormality. No abdominopelvic ascites. Musculoskeletal: No acute bone abnormality. No lytic or blastic bone lesion.  Osseous structures are age appropriate. IMPRESSION: 1. No acute intra-abdominal pathology identified. No urolithiasis. 2. Cholelithiasis. 3. Cardiomegaly. Aortic Atherosclerosis (ICD10-I70.0). Electronically Signed   By: Dorethia Molt M.D.   On: 09/07/2023 01:12    Scheduled Meds:  carvedilol   12.5 mg Oral BID   diltiazem   180 mg Oral q morning   insulin  aspart  0-6 Units Subcutaneous Q4H   potassium chloride   40 mEq Oral Q4H   Continuous Infusions:  sodium chloride      heparin        LOS: 1 day   Fredia Skeeter, MD [facility] Total time spent: 40 minutes  09/08/2023, 8:32 AM   *Please note that this is a verbal dictation therefore any spelling or grammatical errors are due to the Dragon Medical One system interpretation.  Please page via Amion and do not message via secure chat for urgent patient care matters. Secure chat can be used for non urgent patient care matters.  How to contact the [facility] Attending or Consulting provider 7A - 7P or covering provider during after hours 7P -7A, for this patient?  Check the care team in [facility] and look for a) attending/consulting [facility] provider listed and b) the [facility] team listed. Page or secure chat 7A-7P. Log into [email or web address] and use [place]'s universal password to  access. If you do not have the password, please contact the hospital operator. Locate the [facility] provider you are looking for under [facility] and page to a number that you can be directly reached. If you still have difficulty reaching the provider, please page the [facility] (Director on Call) for the Hospitalists listed on amion for assistance.

## 2023-09-08 NOTE — Progress Notes (Signed)
 [facility] Progress Note  SUBJECTIVE:   Interval history: Nicole Jimenez was seen and evaluated today at bedside. Resting in bed, no family at bedside. No abdominal pain. No nausea/vomiting. No chest pain or shortness of breath.   Past Medical History:  Diagnosis Date   Arthritis    Depression    Diabetes mellitus without complication (HCC)    Borderline   Diastolic CHF (HCC) 03/03/2017   Echo 11/2016, nl EF but LVH and diastolic dysfunction   GERD (gastroesophageal reflux disease)    Hyperlipidemia    Hypertension    Peripheral arterial disease (HCC) 2018   thromboembolism of femoral artery   Thyroid  disease    subclinical hyperthyroidism managed by endocrinology   Past Surgical History:  Procedure Laterality Date   FEMORAL ENDARTERECTOMY Right 11/29/2016   Current Facility-Administered Medications  Medication Dose Route Frequency Provider Last Rate Last Admin   0.9 %  sodium chloride  infusion   Intravenous PRN Carolee Rosaline DASEN, RPH 10 mL/hr at 09/08/23 1503 Infusion Verify at 09/08/23 1503   carvedilol  (COREG ) tablet 12.5 mg  12.5 mg Oral BID Franky Redia SAILOR, MD   12.5 mg at 09/08/23 1035   diltiazem  (CARDIZEM  CD) 24 hr capsule 180 mg  180 mg Oral q morning Franky Redia SAILOR, MD   180 mg at 09/08/23 1035   fentaNYL  (SUBLIMAZE ) injection 50 mcg  50 mcg Intravenous Q4H PRN Franky Redia SAILOR, MD       heparin  ADULT infusion 100 units/mL (25000 units/250mL)  500 Units/hr Intravenous Continuous Carolee Rosaline T, RPH 5 mL/hr at 09/08/23 1503 500 Units/hr at 09/08/23 1503   hydrALAZINE  (APRESOLINE ) injection 10 mg  10 mg Intravenous Q6H PRN Pahwani, Ravi, MD       insulin  aspart (novoLOG ) injection 0-6 Units  0-6 Units Subcutaneous Q4H Franky Redia SAILOR, MD   1 Units at 09/07/23 1226   potassium chloride  (KLOR-CON ) packet 40 mEq  40 mEq Oral Q4H Vernon Ranks, MD   40 mEq at 09/08/23 1408   Allergies as of 09/07/2023 - Review Complete 09/07/2023  Allergen  Reaction Noted   Amoxicillin-pot clavulanate Other (See Comments) 11/28/2016   Amoxicillin Other (See Comments) 09/04/2013   Clarithromycin Other (See Comments) 09/04/2013   Review of Systems:  Review of Systems  Respiratory:  Negative for shortness of breath.   Cardiovascular:  Negative for chest pain.  Gastrointestinal:  Negative for abdominal pain, nausea and vomiting.    OBJECTIVE:   Temp:  [98 F (36.7 C)-98.6 F (37 C)] 98.6 F (37 C) (07/31 1334) Pulse Rate:  [70-90] 86 (07/31 1334) Resp:  [16-18] 16 (07/31 1334) BP: (122-140)/(55-79) 131/79 (07/31 1334) SpO2:  [92 %-97 %] 96 % (07/31 1334) Last BM Date : 09/06/23 Physical Exam Constitutional:      General: She is not in acute distress.    Appearance: She is not ill-appearing, toxic-appearing or diaphoretic.  Cardiovascular:     Rate and Rhythm: Normal rate and regular rhythm.  Pulmonary:     Effort: No respiratory distress.     Breath sounds: Normal breath sounds.  Abdominal:     General: Bowel sounds are normal. There is no distension.     Palpations: Abdomen is soft.     Tenderness: There is no abdominal tenderness. There is no guarding.  Neurological:     Mental Status: She is alert.     Labs: Recent Labs    09/07/23 0029 09/07/23 0734 09/08/23 0444  WBC 8.7 6.9 7.2  HGB  13.2 12.3 12.0  HCT 40.9 39.2 37.7  PLT 133* 102* 92*   BMET Recent Labs    09/07/23 0029 09/07/23 0734 09/08/23 0444  NA 138 136 135  K 3.5 3.3* 2.9*  CL 101 104 103  CO2 23 24 20*  GLUCOSE 169* 137* 100*  BUN 34* 29* 24*  CREATININE 1.46* 1.18* 0.95  CALCIUM  9.4 8.8* 8.5*   LFT Recent Labs    09/07/23 0734 09/08/23 0444  PROT 7.3 7.1  ALBUMIN 3.3* 3.0*  AST 1,971* 751*  ALT 831* 588*  ALKPHOS 175* 162*  BILITOT 2.6* 5.2*  BILIDIR 1.6*  --   IBILI 1.0*  --    PT/INR No results for input(s): LABPROT, INR in the last 72 hours. Diagnostic imaging: MR ABDOMEN MRCP WO CONTRAST Result Date:  09/08/2023 CLINICAL DATA:  Rule out choledocholithiasis EXAM: MRI ABDOMEN WITHOUT CONTRAST  (INCLUDING MRCP) TECHNIQUE: Multiplanar multisequence MR imaging of the abdomen was performed. Heavily T2-weighted images of the biliary and pancreatic ducts were obtained, and three-dimensional MRCP images were rendered by post processing. COMPARISON:  Ultrasound abdomen September 07, 2023 small right and trace left FINDINGS: Lower chest: Small right and trace left pleural effusions. Cardiomegaly. Hepatobiliary/MRCP: Mildly nodular liver morphology. No significant focal in liver lesion. Liver measures 12.8 cm in craniocaudal dimension. No significant hepatic steatosis. Gallbladder demonstrate increased wall thickness. Layer of intraluminal tiny stones/sludge along the posterior wall. No pericholecystic fluid. Phrygian cap identified. Common bile duct measures up to 6.5 mm, normal for age. There are a few filling defects overlying distal CBD on some of the MRCP images for example image 11/5 which may represent intraluminal sludge versus tiny nonobstructive choledocholithiasis or represent artifact. Pancreas: No mass, inflammatory changes, or other parenchymal abnormality identified. No pancreas divisum. Pancreatic duct is normal. Spleen: Spleen measures 9.5 cm in length with normal signal intensity. Adrenals/Urinary Tract: Bilateral simple T2 hyperintense renal cortical cysts which does not require imaging follow-up. No hydronephrosis. Both adrenal glands are unremarkable. Stomach/Bowel: Visualized portions within the abdomen are unremarkable. Vascular/Lymphatic: No pathologically enlarged lymph nodes identified. No abdominal aortic aneurysm demonstrated. Other:  Small fat containing umbilical hernia partially visualized Musculoskeletal: No suspicious bone lesions identified. IMPRESSION: Gallbladder wall thickening with intraluminal layering stones/sludge. Prominent common bile duct with questionable nonobstructive intraluminal  filling defects in distal CBD, possibly sludge. Punctate choledocholithiasis cannot be excluded. Cardiomegaly. Bilateral pleural effusions. Bilateral renal cortical cysts which does not require imaging follow-up. Electronically Signed   By: Megan  Zare M.D.   On: 09/08/2023 12:30   MR 3D Recon At Scanner Result Date: 09/08/2023 CLINICAL DATA:  Rule out choledocholithiasis EXAM: MRI ABDOMEN WITHOUT CONTRAST  (INCLUDING MRCP) TECHNIQUE: Multiplanar multisequence MR imaging of the abdomen was performed. Heavily T2-weighted images of the biliary and pancreatic ducts were obtained, and three-dimensional MRCP images were rendered by post processing. COMPARISON:  Ultrasound abdomen September 07, 2023 small right and trace left FINDINGS: Lower chest: Small right and trace left pleural effusions. Cardiomegaly. Hepatobiliary/MRCP: Mildly nodular liver morphology. No significant focal in liver lesion. Liver measures 12.8 cm in craniocaudal dimension. No significant hepatic steatosis. Gallbladder demonstrate increased wall thickness. Layer of intraluminal tiny stones/sludge along the posterior wall. No pericholecystic fluid. Phrygian cap identified. Common bile duct measures up to 6.5 mm, normal for age. There are a few filling defects overlying distal CBD on some of the MRCP images for example image 11/5 which may represent intraluminal sludge versus tiny nonobstructive choledocholithiasis or represent artifact. Pancreas: No mass, inflammatory changes, or  other parenchymal abnormality identified. No pancreas divisum. Pancreatic duct is normal. Spleen: Spleen measures 9.5 cm in length with normal signal intensity. Adrenals/Urinary Tract: Bilateral simple T2 hyperintense renal cortical cysts which does not require imaging follow-up. No hydronephrosis. Both adrenal glands are unremarkable. Stomach/Bowel: Visualized portions within the abdomen are unremarkable. Vascular/Lymphatic: No pathologically enlarged lymph nodes identified.  No abdominal aortic aneurysm demonstrated. Other:  Small fat containing umbilical hernia partially visualized Musculoskeletal: No suspicious bone lesions identified. IMPRESSION: Gallbladder wall thickening with intraluminal layering stones/sludge. Prominent common bile duct with questionable nonobstructive intraluminal filling defects in distal CBD, possibly sludge. Punctate choledocholithiasis cannot be excluded. Cardiomegaly. Bilateral pleural effusions. Bilateral renal cortical cysts which does not require imaging follow-up. Electronically Signed   By: Megan  Zare M.D.   On: 09/08/2023 12:30   US  Abdomen Limited RUQ (LIVER/GB) Result Date: 09/07/2023 CLINICAL DATA:  89 year old female with abdominal pain. Cholelithiasis on CT Abdomen and Pelvis today. EXAM: ULTRASOUND ABDOMEN LIMITED RIGHT UPPER QUADRANT COMPARISON:  Noncontrast CT Abdomen and Pelvis 0036 hours. Previous CT Abdomen and Pelvis 04/14/2022 FINDINGS: Gallbladder: Abnormal gallbladder wall thickening and heterogeneity up to 10 mm on image 7. Incompletely distended gallbladder. Evidence of small nonshadowing echogenic stones (images 8 and 13). Individual stone size estimated up to 8 mm. However, no sonographic Murphy sign elicited. Common bile duct: Diameter: Dilated up to 8 mm, concordant with CT today (image 16). New from 2024 CT. Possible sludge within the duct. No stone identified within the visible duct. Liver: Mild if any intrahepatic biliary ductal dilatation. No discrete liver lesion. Background echogenicity within normal limits. But the liver contour does appear mildly nodular (image 33). Portal vein is patent on color Doppler imaging with normal direction of blood flow towards the liver. Other: Negative visible right kidney. No right upper quadrant free fluid. IMPRESSION: 1. Small gallstones with new CBD enlargement since 2024, suspicious for Choledocholithiasis. 2. Furthermore, there is Abnormal Gallbladder Wall Thickening and  heterogeneity. However, no sonographic Murphy sign elicited to strongly indicate acute cholecystitis. 3. Mildly nodular liver contour, early Cirrhosis not excluded. Electronically Signed   By: VEAR Hurst M.D.   On: 09/07/2023 06:21   CT Renal Stone Study Result Date: 09/07/2023 CLINICAL DATA:  Right upper quadrant abdominal pain, flank pain EXAM: CT ABDOMEN AND PELVIS WITHOUT CONTRAST TECHNIQUE: Multidetector CT imaging of the abdomen and pelvis was performed following the standard protocol without IV contrast. RADIATION DOSE REDUCTION: This exam was performed according to the departmental dose-optimization program which includes automated exposure control, adjustment of the mA and/or kV according to patient size and/or use of iterative reconstruction technique. COMPARISON:  05/09/2022 FINDINGS: Lower chest: No acute abnormality.  Cardiomegaly. Hepatobiliary: Cholelithiasis without superimposed pericholecystic inflammatory change. Liver unremarkable on this noncontrast examination. No intra or extrahepatic biliary ductal dilation. Pancreas: Unremarkable Spleen: Unremarkable Adrenals/Urinary Tract: Adrenal glands are unremarkable. Kidneys are normal, without renal calculi, focal lesion, or hydronephrosis. Bladder is unremarkable. Stomach/Bowel: Stomach is within normal limits. Appendix appears normal. No evidence of bowel wall thickening, distention, or inflammatory changes. Vascular/Lymphatic: Aortic atherosclerosis. No enlarged abdominal or pelvic lymph nodes. Reproductive: Multiple calcified involuted uterine fibroids are seen within the uterus. The pelvic organs are otherwise unremarkable. Other: No abdominal wall hernia or abnormality. No abdominopelvic ascites. Musculoskeletal: No acute bone abnormality. No lytic or blastic bone lesion. Osseous structures are age appropriate. IMPRESSION: 1. No acute intra-abdominal pathology identified. No urolithiasis. 2. Cholelithiasis. 3. Cardiomegaly. Aortic Atherosclerosis  (ICD10-I70.0). Electronically Signed   By: Dorethia Kimberlee HERO.D.  On: 09/07/2023 01:12   IMPRESSION: Gallstone pancreatitis Dilated bile duct on imaging, MRCP 09/08/23 with questionable choledocholithiasis Hyperbilirubinemia Transaminase elevation in mixed pattern Abdominal pain secondary to above, improved Atrial fibrillation on Eliquis , currently on heparin  infusion  PLAN: -Total bilirubin elevated today, AST/ALT improved, MRCP with questionable choledocholithiasis -Given rising bilirubin and possible choledocholithiasis on MRCP, would offer ERCP -Discussed ERCP with patient, she asked appropriate questions and we discussed benefits, alternatives and risks of bleeding/infection/perforation/pancreatitis/anesthesia -She remains somewhat apprehensive to any procedural intervention, though understands that if no action were taken she has increased risk of infection -Will place NPO at midnight, trend liver enzymes tomorrow -Will coordinate with Dr. Wilhelmenia schedule tomorrow for ERCP if needed/able -Will need to hold heparin  infusion for 6 hours prior to procedure    LOS: 1 day   Estefana Keas, DO [facility]

## 2023-09-08 NOTE — Plan of Care (Signed)
  Problem: Health Behavior/Discharge Planning: Goal: Ability to manage health-related needs will improve Outcome: Progressing   Problem: Clinical Measurements: Goal: Ability to maintain clinical measurements within normal limits will improve Outcome: Progressing Goal: Will remain free from infection Outcome: Progressing Goal: Diagnostic test results will improve Outcome: Progressing Goal: Respiratory complications will improve Outcome: Progressing Goal: Cardiovascular complication will be avoided Outcome: Progressing   Problem: Activity: Goal: Risk for activity intolerance will decrease Outcome: Progressing   Problem: Nutrition: Goal: Adequate nutrition will be maintained Outcome: Progressing   Problem: Coping: Goal: Level of anxiety will decrease Outcome: Progressing   Problem: Elimination: Goal: Will not experience complications related to bowel motility Outcome: Completed/Met   Problem: Pain Managment: Goal: General experience of comfort will improve and/or be controlled Outcome: Progressing   Problem: Safety: Goal: Ability to remain free from injury will improve Outcome: Progressing   Problem: Skin Integrity: Goal: Risk for impaired skin integrity will decrease Outcome: Progressing   Problem: Skin Integrity: Goal: Risk for impaired skin integrity will decrease Outcome: Progressing   Problem: Tissue Perfusion: Goal: Adequacy of tissue perfusion will improve Outcome: Progressing   Problem: Education: Goal: Knowledge of Pancreatitis treatment and prevention will improve Outcome: Progressing   Problem: Health Behavior/Discharge Planning: Goal: Ability to formulate a plan to maintain an alcohol-free life will improve Outcome: Not Applicable   Problem: Nutritional: Goal: Ability to achieve adequate nutritional intake will improve Outcome: Progressing   Problem: Clinical Measurements: Goal: Complications related to the disease process, condition or  treatment will be avoided or minimized Outcome: Progressing

## 2023-09-08 NOTE — Progress Notes (Signed)
 Received a secure chat from MRI asking if this patient could be disconnected from her Heparin  gtt. I contacted pharmacy for their input. The pharmacist stated that it was not up to them whether or not to hold the Heparin  gtt, that it was up to the surgeon/MD doing the procedure and MRI. Awaiting a response from the MD before holding the Heparin  since it cannot be held without an MD order.

## 2023-09-08 NOTE — Progress Notes (Signed)
 PHARMACY - ANTICOAGULATION CONSULT NOTE  Pharmacy Consult for heparin   Indication: atrial fibrillation  Allergies  Allergen Reactions   Amoxicillin-Pot Clavulanate Other (See Comments)    unknown   Amoxicillin Other (See Comments)    unknown   Clarithromycin Other (See Comments)    unknown    Patient Measurements: Height: 5' 1 (154.9 cm) Weight: 58.1 kg (128 lb 1.4 oz) IBW/kg (Calculated) : 47.8 HEPARIN  DW (KG): 58.1  Vital Signs: Temp: 98.1 F (36.7 C) (07/31 0417) Temp Source: Oral (07/31 0417) BP: 140/61 (07/31 0417) Pulse Rate: 70 (07/31 0417)  Labs: Recent Labs    09/07/23 0029 09/07/23 0734 09/07/23 1753 09/08/23 0444  HGB 13.2 12.3  --  12.0  HCT 40.9 39.2  --  37.7  PLT 133* 102*  --  92*  APTT  --  38* >200*  --   HEPARINUNFRC  --  >1.10*  --  0.80*  CREATININE 1.46* 1.18*  --  0.95    Estimated Creatinine Clearance: 34.8 mL/min (by C-G formula based on SCr of 0.95 mg/dL).  Assessment: Pharmacy consulted to manage heparin  bridge therapy for Afib in 89 yo F on apixaban  PTA. Apixaban  2.5 bid PTA last dose - 7/29 evening dose around 9 pm per pt report to RN PMH thrombocytopenia, CKD3b, Afib, HTN, HLD, DM2.  Baseline aPTT 38, baseline HL falsely elevated d/t recent DOAC use.  09/08/2023 aPTT >200, supratherapeutic after heparin  held x 1 hour and rate reduced to 650 units/hr Per RN: labs drawn from opposite arm.  No bleeding, complications, or IV site issues reported by RN.  Hg 12 - stable PLT 92 - low but stable (hx thrombocytopenia)   Goal of Therapy:  Heparin  level 0.3-0.7 units/ml aPTT 66-102 seconds Monitor platelets by anticoagulation protocol: Yes   Plan:  Hold heparin  IV for one hour Resume at reduced heparin  IV infusion 500 units/hr Check 8 hour aPTT Daily CBC, heparin  level & aPTT F/u plans for MRCP, cholecystectomy  Rosaline IVAR Edison, Pharm.D Use secure chat for questions 09/08/2023 7:34 AM

## 2023-09-09 ENCOUNTER — Inpatient Hospital Stay (HOSPITAL_COMMUNITY)

## 2023-09-09 ENCOUNTER — Encounter (HOSPITAL_COMMUNITY): Payer: Self-pay | Admitting: Internal Medicine

## 2023-09-09 ENCOUNTER — Encounter (HOSPITAL_COMMUNITY)
Admission: EM | Disposition: A | Discharge status: Home Health | Payer: Self-pay | Source: Home / Self Care | Attending: Family Medicine

## 2023-09-09 DIAGNOSIS — I130 Hypertensive heart and chronic kidney disease with heart failure and stage 1 through stage 4 chronic kidney disease, or unspecified chronic kidney disease: Secondary | ICD-10-CM | POA: Diagnosis not present

## 2023-09-09 DIAGNOSIS — K8020 Calculus of gallbladder without cholecystitis without obstruction: Secondary | ICD-10-CM | POA: Diagnosis not present

## 2023-09-09 DIAGNOSIS — I5032 Chronic diastolic (congestive) heart failure: Secondary | ICD-10-CM | POA: Diagnosis not present

## 2023-09-09 DIAGNOSIS — K8050 Calculus of bile duct without cholangitis or cholecystitis without obstruction: Secondary | ICD-10-CM | POA: Diagnosis not present

## 2023-09-09 DIAGNOSIS — K8070 Calculus of gallbladder and bile duct without cholecystitis without obstruction: Secondary | ICD-10-CM | POA: Diagnosis not present

## 2023-09-09 DIAGNOSIS — K3189 Other diseases of stomach and duodenum: Secondary | ICD-10-CM | POA: Diagnosis not present

## 2023-09-09 DIAGNOSIS — K838 Other specified diseases of biliary tract: Secondary | ICD-10-CM

## 2023-09-09 DIAGNOSIS — N1832 Chronic kidney disease, stage 3b: Secondary | ICD-10-CM | POA: Diagnosis not present

## 2023-09-09 DIAGNOSIS — K2970 Gastritis, unspecified, without bleeding: Secondary | ICD-10-CM

## 2023-09-09 DIAGNOSIS — K851 Biliary acute pancreatitis without necrosis or infection: Secondary | ICD-10-CM | POA: Diagnosis not present

## 2023-09-09 DIAGNOSIS — R7989 Other specified abnormal findings of blood chemistry: Secondary | ICD-10-CM | POA: Diagnosis not present

## 2023-09-09 DIAGNOSIS — K831 Obstruction of bile duct: Secondary | ICD-10-CM

## 2023-09-09 DIAGNOSIS — K859 Acute pancreatitis without necrosis or infection, unspecified: Secondary | ICD-10-CM | POA: Diagnosis not present

## 2023-09-09 HISTORY — PX: ERCP: SHX5425

## 2023-09-09 LAB — CBC
HCT: 36.4 % (ref 36.0–46.0)
Hemoglobin: 11.4 g/dL — ABNORMAL LOW (ref 12.0–15.0)
MCH: 28.4 pg (ref 26.0–34.0)
MCHC: 31.3 g/dL (ref 30.0–36.0)
MCV: 90.5 fL (ref 80.0–100.0)
Platelets: 83 K/uL — ABNORMAL LOW (ref 150–400)
RBC: 4.02 MIL/uL (ref 3.87–5.11)
RDW: 14 % (ref 11.5–15.5)
WBC: 4.9 K/uL (ref 4.0–10.5)
nRBC: 0 % (ref 0.0–0.2)

## 2023-09-09 LAB — COMPREHENSIVE METABOLIC PANEL WITH GFR
ALT: 366 U/L — ABNORMAL HIGH (ref 0–44)
AST: 335 U/L — ABNORMAL HIGH (ref 15–41)
Albumin: 2.8 g/dL — ABNORMAL LOW (ref 3.5–5.0)
Alkaline Phosphatase: 189 U/L — ABNORMAL HIGH (ref 38–126)
Anion gap: 10 (ref 5–15)
BUN: 22 mg/dL (ref 8–23)
CO2: 19 mmol/L — ABNORMAL LOW (ref 22–32)
Calcium: 8.8 mg/dL — ABNORMAL LOW (ref 8.9–10.3)
Chloride: 106 mmol/L (ref 98–111)
Creatinine, Ser: 1.23 mg/dL — ABNORMAL HIGH (ref 0.44–1.00)
GFR, Estimated: 43 mL/min — ABNORMAL LOW (ref >60)
Glucose, Bld: 130 mg/dL — ABNORMAL HIGH (ref 70–99)
Potassium: 3.7 mmol/L (ref 3.5–5.1)
Sodium: 135 mmol/L (ref 135–145)
Total Bilirubin: 5.6 mg/dL — ABNORMAL HIGH (ref 0.0–1.2)
Total Protein: 6.7 g/dL (ref 6.5–8.1)

## 2023-09-09 LAB — GLUCOSE, CAPILLARY
Glucose-Capillary: 110 mg/dL — ABNORMAL HIGH (ref 70–99)
Glucose-Capillary: 111 mg/dL — ABNORMAL HIGH (ref 70–99)
Glucose-Capillary: 117 mg/dL — ABNORMAL HIGH (ref 70–99)
Glucose-Capillary: 118 mg/dL — ABNORMAL HIGH (ref 70–99)
Glucose-Capillary: 187 mg/dL — ABNORMAL HIGH (ref 70–99)

## 2023-09-09 LAB — APTT: aPTT: 68 s — ABNORMAL HIGH (ref 24–36)

## 2023-09-09 LAB — LIPASE, BLOOD: Lipase: 35 U/L (ref 11–51)

## 2023-09-09 LAB — HEPARIN LEVEL (UNFRACTIONATED): Heparin Unfractionated: 0.14 [IU]/mL — ABNORMAL LOW (ref 0.30–0.70)

## 2023-09-09 SURGERY — ERCP, WITH INTERVENTION IF INDICATED
Anesthesia: General

## 2023-09-09 MED ORDER — FENTANYL CITRATE (PF) 100 MCG/2ML IJ SOLN
INTRAMUSCULAR | Status: AC
  Filled 2023-09-09: qty 2

## 2023-09-09 MED ORDER — GLUCAGON HCL RDNA (DIAGNOSTIC) 1 MG IJ SOLR
INTRAMUSCULAR | Status: AC
  Filled 2023-09-09: qty 1

## 2023-09-09 MED ORDER — MIDAZOLAM HCL 2 MG/2ML IJ SOLN
INTRAMUSCULAR | Status: DC | PRN
  Administered 2023-09-09: 1 mg via INTRAVENOUS

## 2023-09-09 MED ORDER — MIDAZOLAM HCL 2 MG/2ML IJ SOLN
INTRAMUSCULAR | Status: AC
  Filled 2023-09-09: qty 2

## 2023-09-09 MED ORDER — CIPROFLOXACIN IN D5W 400 MG/200ML IV SOLN
INTRAVENOUS | Status: DC | PRN
Start: 2023-09-09 — End: 2023-09-09
  Administered 2023-09-09: 400 mg via INTRAVENOUS

## 2023-09-09 MED ORDER — CIPROFLOXACIN IN D5W 400 MG/200ML IV SOLN
INTRAVENOUS | Status: AC
  Filled 2023-09-09: qty 200

## 2023-09-09 MED ORDER — ROCURONIUM BROMIDE 10 MG/ML (PF) SYRINGE
PREFILLED_SYRINGE | INTRAVENOUS | Status: DC | PRN
  Administered 2023-09-09: 40 mg via INTRAVENOUS

## 2023-09-09 MED ORDER — LIDOCAINE 2% (20 MG/ML) 5 ML SYRINGE
INTRAMUSCULAR | Status: DC | PRN
  Administered 2023-09-09: 60 mg via INTRAVENOUS

## 2023-09-09 MED ORDER — PHENYLEPHRINE 80 MCG/ML (10ML) SYRINGE FOR IV PUSH (FOR BLOOD PRESSURE SUPPORT)
PREFILLED_SYRINGE | INTRAVENOUS | Status: DC | PRN
  Administered 2023-09-09: 160 ug via INTRAVENOUS

## 2023-09-09 MED ORDER — PROPOFOL 10 MG/ML IV BOLUS
INTRAVENOUS | Status: AC
  Filled 2023-09-09: qty 20

## 2023-09-09 MED ORDER — DICLOFENAC SUPPOSITORY 100 MG
RECTAL | Status: DC | PRN
  Administered 2023-09-09: 100 mg via RECTAL

## 2023-09-09 MED ORDER — PHENYLEPHRINE HCL-NACL 20-0.9 MG/250ML-% IV SOLN
INTRAVENOUS | Status: DC | PRN
  Administered 2023-09-09: 50 ug/min via INTRAVENOUS

## 2023-09-09 MED ORDER — SODIUM CHLORIDE 0.9 % IV SOLN: INTRAVENOUS | Status: DC

## 2023-09-09 MED ORDER — ONDANSETRON HCL 4 MG/2ML IJ SOLN
INTRAMUSCULAR | Status: DC | PRN
  Administered 2023-09-09: 4 mg via INTRAVENOUS

## 2023-09-09 MED ORDER — ONDANSETRON HCL 4 MG/2ML IJ SOLN
4.0000 mg | Freq: Four times a day (QID) | INTRAMUSCULAR | Status: DC | PRN
  Administered 2023-09-09: 4 mg via INTRAVENOUS
  Filled 2023-09-09: qty 2

## 2023-09-09 MED ORDER — PANTOPRAZOLE SODIUM 40 MG PO TBEC
40.0000 mg | DELAYED_RELEASE_TABLET | Freq: Every day | ORAL | Status: DC
  Administered 2023-09-10 – 2023-09-12 (×3): 40 mg via ORAL
  Filled 2023-09-09 (×3): qty 1

## 2023-09-09 MED ORDER — PROPOFOL 10 MG/ML IV BOLUS
INTRAVENOUS | Status: DC | PRN
  Administered 2023-09-09: 100 mg via INTRAVENOUS

## 2023-09-09 MED ORDER — DEXAMETHASONE SODIUM PHOSPHATE 10 MG/ML IJ SOLN
INTRAMUSCULAR | Status: DC | PRN
  Administered 2023-09-09: 5 mg via INTRAVENOUS

## 2023-09-09 MED ORDER — GLUCAGON HCL RDNA (DIAGNOSTIC) 1 MG IJ SOLR
INTRAMUSCULAR | Status: DC | PRN
Start: 2023-09-09 — End: 2023-09-09
  Administered 2023-09-09 (×2): .25 mg via INTRAVENOUS

## 2023-09-09 MED ORDER — FENTANYL CITRATE (PF) 250 MCG/5ML IJ SOLN
INTRAMUSCULAR | Status: DC | PRN
  Administered 2023-09-09 (×2): 50 ug via INTRAVENOUS

## 2023-09-09 MED ORDER — SUGAMMADEX SODIUM 200 MG/2ML IV SOLN
INTRAVENOUS | Status: DC | PRN
  Administered 2023-09-09: 120 mg via INTRAVENOUS

## 2023-09-09 MED ORDER — DICLOFENAC SUPPOSITORY 100 MG
RECTAL | Status: AC
  Filled 2023-09-09: qty 1

## 2023-09-09 MED ORDER — LACTATED RINGERS IV SOLN
INTRAVENOUS | Status: AC | PRN
  Administered 2023-09-09: 1000 mL via INTRAVENOUS

## 2023-09-09 NOTE — Op Note (Signed)
 [facility] Patient Name: Nicole Jimenez Procedure Date: 09/09/2023 MRN: [id number] Attending MD: Aloha Finner , MD, [id number] Date of Birth: [birth year] CSN: [id number] Age: 89 Admit Type: Inpatient Procedure:                ERCP Indications:              Bile duct stone(s), Abnormal MRCP, Jaundice,                            Abnormal liver function test Providers:                Aloha Finner, MD, Darleene Bare, RN, Curtistine Bishop, Technician, Lorrayne Kitty, Technician Referring MD:             Inpatient medical service, inpatient surgical                            service, inpatient GI service Medicines:                General Anesthesia, Cipro 400 mg IV, Diclofenac  100                            mg rectal, Glucagon 0.5 mg IV Complications:            No immediate complications. Estimated Blood Loss:     Estimated blood loss was minimal. Procedure:                Pre-Anesthesia Assessment:                           - Prior to the procedure, a History and Physical                            was performed, and patient medications and                            allergies were reviewed. The patient's tolerance of                            previous anesthesia was also reviewed. The risks                            and benefits of the procedure and the sedation                            options and risks were discussed with the patient.                            All questions were answered, and informed consent                            was obtained. Prior Anticoagulants: The patient  last took Eliquis  (apixaban ) 3 days prior to the                            procedure and last took heparin  on the day of the                            procedure. ASA Grade Assessment: III - A patient                            with severe systemic disease. After reviewing the                            risks and benefits,  the patient was deemed in                            satisfactory condition to undergo the procedure.                           After obtaining informed consent, the scope was                            passed under direct vision. Throughout the                            procedure, the patient's blood pressure, pulse, and                            oxygen saturations were monitored continuously. The                            [employer] D single use                            duodenoscope was introduced through the mouth, and                            used to inject contrast into and used to inject                            contrast into the bile duct. The ERCP was                            accomplished without difficulty. The patient                            tolerated the procedure. Scope In: Scope Out: Findings:      The scout film was normal.      The upper GI tract was traversed under direct vision without detailed       examination. Patchy mildly erythematous mucosa without bleeding was       found in the entire examined stomach - biopsied for H. pylori       assessment. No gross lesions were noted in the duodenal bulb, in the  first portion of the duodenum and in the second portion of the duodenum.       The major papilla was normal, but hidden under a significant could with       a large mucosal fold.      A 0.035 inch x 260 cm straight Hydra Jagwire was passed into the biliary       tree on first attempt. The short-nosed traction sphincterotome was       passed over the guidewire and the bile duct was then deeply cannulated.       Contrast was injected. I personally interpreted the bile duct images.       Ductal flow of contrast was adequate. Image quality was adequate.       Contrast extended to the hepatic ducts. Opacification of the entire       opacified area was successful. The maximum diameter of the ducts was 8       mm. The lower third of  the main bile duct contained filling defect       thought to be sludge. The gallbladder contained multiple stones. A 4 mm       biliary sphincterotomy was made with a monofilament traction (standard)       sphincterotome using ERBE electrocautery. There was no       post-sphincterotomy bleeding. Dilation of the common bile duct with a 6       mm balloon dilator was successful as a sphincteroplasty for 2 minutes as       a result of the large fluid and fold. To discover objects, the biliary       tree was swept with a retrieval balloon. Sludge was swept from the duct.       An occlusion cholangiogram was performed that showed no further       significant biliary pathology other than the finding of cholelithiasis       within the gallbladder.      A formal pancreatogram was not performed.      The duodenoscope was withdrawn from the patient. Impression:               - Erythematous mucosa in the stomach. Biopsy for H.                            pylori assessment.                           - No gross lesions in the duodenal bulb, in the                            first portion of the duodenum and in the second                            portion of the duodenum.                           - The major papilla appeared normal, though hidden                            under a large could and fold.                           -  The fluoroscopic examination was suspicious for                            sludge.                           - A biliary sphincterotomy was performed. Balloon                            sphincteroplasty performed as well.                           - The biliary tree was swept and sludge was found.                           - Cholelithiasis noted on fluoroscopic images                            within the gallbladder. Moderate Sedation:      Not Applicable - Patient had care per Anesthesia. Recommendation:           - The patient will be observed post-procedure,                             until all discharge criteria are met.                           - Discharge patient to home.                           - Patient has a contact number available for                            emergencies. The signs and symptoms of potential                            delayed complications were discussed with the                            patient. Return to normal activities tomorrow.                            Written discharge instructions were provided to the                            patient.                           - Advance diet as tolerated.                           - Observe patient's clinical course.                           - Check liver enzymes (AST, ALT, alkaline  phosphatase, bilirubin) in the morning.                           - Watch for pancreatitis, bleeding, perforation,                            and cholangitis.                           - Timing of possible cholecystectomy as per surgery                            service, as this would be definitive treatment for                            patient to not have recurrent gallstone                            pancreatitis or recurrent obstruction.                           - Hold DOAC for at least 48 hours. If                            anticoagulation is required sooner, may restart at                            midnight without bolus and advance as necessary.                            Would confirm with surgical service however before                            restarting of heparin  in case surgical intervention                            is this weekend.                           - The findings and recommendations were discussed                            with the patient.                           - The findings and recommendations were discussed                            with the referring physician. Procedure Code(s):        --- Professional ---                            [phone], 59, Endoscopic retrograde                            cholangiopancreatography (ERCP); with  trans-endoscopic balloon dilation of                            biliary/pancreatic duct(s) or of ampulla                            (sphincteroplasty), including sphincterotomy, when                            performed, each duct                           43264, Endoscopic retrograde                            cholangiopancreatography (ERCP); with removal of                            calculi/debris from biliary/pancreatic duct(s)                           74328, 26, Endoscopic catheterization of the                            biliary ductal system, radiological supervision and                            interpretation Diagnosis Code(s):        --- Professional ---                           K31.89, Other diseases of stomach and duodenum                           K80.20, Calculus of gallbladder without                            cholecystitis without obstruction                           K80.50, Calculus of bile duct without cholangitis                            or cholecystitis without obstruction                           R17, Unspecified jaundice                           R79.89, Other specified abnormal findings of blood                            chemistry                           R93.2, Abnormal findings on diagnostic imaging of  liver and biliary tract CPT copyright 2022 [employer]. All rights reserved. The codes documented in this report are preliminary and upon coder review may  be revised to meet current compliance requirements. Aloha Finner, MD 09/09/2023 3:54:25 PM Number of Addenda: 0

## 2023-09-09 NOTE — Progress Notes (Signed)
 PHARMACY - ANTICOAGULATION CONSULT NOTE  Pharmacy Consult for heparin   Indication: atrial fibrillation  Allergies  Allergen Reactions   Amoxicillin-Pot Clavulanate Other (See Comments)    unknown   Amoxicillin Other (See Comments)    unknown   Clarithromycin Other (See Comments)    unknown    Patient Measurements: Height: 5' 1 (154.9 cm) Weight: 58.1 kg (128 lb 1.4 oz) IBW/kg (Calculated) : 47.8 HEPARIN  DW (KG): 58.1  Vital Signs: Temp: 98 F (36.7 C) (08/01 0634) BP: 116/62 (08/01 0634) Pulse Rate: 62 (08/01 0634)  Labs: Recent Labs    09/07/23 0734 09/07/23 1753 09/08/23 0444 09/08/23 1900 09/09/23 0332  HGB 12.3  --  12.0  --  11.4*  HCT 39.2  --  37.7  --  36.4  PLT 102*  --  92*  --  83*  APTT 38*   < > >200* 86* 68*  HEPARINUNFRC >1.10*  --  0.80*  --  0.14*  CREATININE 1.18*  --  0.95  --  1.23*   < > = values in this interval not displayed.    Estimated Creatinine Clearance: 26.9 mL/min (A) (by C-G formula based on SCr of 1.23 mg/dL (H)).  Assessment: Pharmacy consulted to manage heparin  bridge therapy for Afib in 89 yo F on apixaban  2.5 mg BID PTA; last dose - 7/29 evening around 9 pm. PMH thrombocytopenia, CKD3b, Afib, HTN, HLD, DM2. Baseline aPTT 38, baseline HL falsely elevated d/t recent DOAC use.  09/09/2023 AM aPTT 68 sec - remains therapeutic on heparin  drip @ 500 units/hr AM heparin  level 0.14 - below goal on heparin  @ 500 units/hr No bleeding, complications, or IV site issues reported by RN.  Hg 11.4 - stable PLT 83 - low but stable (hx thrombocytopenia)  Heparin  turned off at 0815 am for possible ERCP later today  Goal of Therapy:  Heparin  level 0.3-0.7 units/ml aPTT 66-102 seconds Monitor platelets by anticoagulation protocol: Yes   Plan:  Heparin  off since 0815 am for possible ERCP this afternoon per GI F/u if/when heparin  to be resumed with GI Daily CBC, heparin  level  Rosaline IVAR Edison, Pharm.D Use secure chat for  questions 09/09/2023 9:49 AM

## 2023-09-09 NOTE — Anesthesia Preprocedure Evaluation (Addendum)
 Anesthesia Evaluation  Patient identified by MRN, date of birth, ID band Patient awake    Reviewed: Allergy & Precautions, H&P , NPO status , Patient's Chart, lab work & pertinent test results  Airway Mallampati: II  TM Distance: >3 FB Neck ROM: Full    Dental no notable dental hx. (+) Partial Upper   Pulmonary neg pulmonary ROS   Pulmonary exam normal breath sounds clear to auscultation       Cardiovascular hypertension, + Peripheral Vascular Disease and +CHF  Normal cardiovascular exam+ dysrhythmias Atrial Fibrillation  Rhythm:Regular Rate:Normal     Neuro/Psych  PSYCHIATRIC DISORDERS  Depression    negative neurological ROS     GI/Hepatic Neg liver ROS,GERD  ,,Acute gallstone pancreatitis   Endo/Other  diabetes, Type 2 Hyperthyroidism   Renal/GU Renal InsufficiencyRenal disease  negative genitourinary   Musculoskeletal  (+) Arthritis ,    Abdominal   Peds negative pediatric ROS (+)  Hematology negative hematology ROS (+)   Anesthesia Other Findings   Reproductive/Obstetrics negative OB ROS                              Anesthesia Physical Anesthesia Plan  ASA: 3  Anesthesia Plan: General   Post-op Pain Management:    Induction: Intravenous  PONV Risk Score and Plan: 3 and Ondansetron , Dexamethasone and Treatment may vary due to age or medical condition  Airway Management Planned: Oral ETT  Additional Equipment: None  Intra-op Plan:   Post-operative Plan: Extubation in OR  Informed Consent: I have reviewed the patients History and Physical, chart, labs and discussed the procedure including the risks, benefits and alternatives for the proposed anesthesia with the patient or authorized representative who has indicated his/her understanding and acceptance.     Dental advisory given  Plan Discussed with: CRNA  Anesthesia Plan Comments:          Anesthesia Quick  Evaluation

## 2023-09-09 NOTE — Progress Notes (Signed)
 Recovery time 1558

## 2023-09-09 NOTE — Progress Notes (Signed)
 Progress Note     Subjective: Patient denies abdominal pain. Reports bowel movement yesterday evening that was soft and without blood. Has not had bowel movement today. Reports flatulence. Had mild nausea last night. Denies nausea currently. Denies vomiting.   ROS  All negative with the exception of above.   Objective: Vital signs in last 24 hours: Temp:  [98 F (36.7 C)-98.6 F (37 C)] 98 F (36.7 C) (08/01 0634) Pulse Rate:  [62-86] 62 (08/01 0634) Resp:  [16-18] 18 (08/01 0634) BP: (116-139)/(62-79) 116/62 (08/01 0634) SpO2:  [94 %-98 %] 94 % (08/01 0634) Last BM Date : 09/08/23  Intake/Output from previous day: 07/31 0701 - 08/01 0700 In: 546 [P.O.:240; I.V.:306] Out: -  Intake/Output this shift: No intake/output data recorded.  PE: General: Pleasant, female who is laying in bed in NAD. HEENT: Sclera are noninjected. Conjunctiva anicteric. EOMI. Lungs: Respiratory effort nonlabored Abd: Soft, NT, ND. No guarding or rebound tenderness.  Psych: A&Ox3 with an appropriate affect.    Lab Results:  Recent Labs    09/08/23 0444 09/09/23 0332  WBC 7.2 4.9  HGB 12.0 11.4*  HCT 37.7 36.4  PLT 92* 83*   BMET Recent Labs    09/08/23 0444 09/09/23 0332  NA 135 135  K 2.9* 3.7  CL 103 106  CO2 20* 19*  GLUCOSE 100* 130*  BUN 24* 22  CREATININE 0.95 1.23*  CALCIUM  8.5* 8.8*   PT/INR No results for input(s): LABPROT, INR in the last 72 hours. CMP     Component Value Date/Time   NA 135 09/09/2023 0332   K 3.7 09/09/2023 0332   CL 106 09/09/2023 0332   CO2 19 (L) 09/09/2023 0332   GLUCOSE 130 (H) 09/09/2023 0332   BUN 22 09/09/2023 0332   CREATININE 1.23 (H) 09/09/2023 0332   CREATININE 0.95 (H) 01/24/2017 1621   CALCIUM  8.8 (L) 09/09/2023 0332   PROT 6.7 09/09/2023 0332   ALBUMIN 2.8 (L) 09/09/2023 0332   AST 335 (H) 09/09/2023 0332   ALT 366 (H) 09/09/2023 0332   ALKPHOS 189 (H) 09/09/2023 0332   BILITOT 5.6 (H) 09/09/2023 0332   GFRNONAA 43  (L) 09/09/2023 0332   Lipase     Component Value Date/Time   LIPASE 35 09/09/2023 0332       Studies/Results: MR ABDOMEN MRCP WO CONTRAST Result Date: 09/08/2023 CLINICAL DATA:  Rule out choledocholithiasis EXAM: MRI ABDOMEN WITHOUT CONTRAST  (INCLUDING MRCP) TECHNIQUE: Multiplanar multisequence MR imaging of the abdomen was performed. Heavily T2-weighted images of the biliary and pancreatic ducts were obtained, and three-dimensional MRCP images were rendered by post processing. COMPARISON:  Ultrasound abdomen September 07, 2023 small right and trace left FINDINGS: Lower chest: Small right and trace left pleural effusions. Cardiomegaly. Hepatobiliary/MRCP: Mildly nodular liver morphology. No significant focal in liver lesion. Liver measures 12.8 cm in craniocaudal dimension. No significant hepatic steatosis. Gallbladder demonstrate increased wall thickness. Layer of intraluminal tiny stones/sludge along the posterior wall. No pericholecystic fluid. Phrygian cap identified. Common bile duct measures up to 6.5 mm, normal for age. There are a few filling defects overlying distal CBD on some of the MRCP images for example image 11/5 which may represent intraluminal sludge versus tiny nonobstructive choledocholithiasis or represent artifact. Pancreas: No mass, inflammatory changes, or other parenchymal abnormality identified. No pancreas divisum. Pancreatic duct is normal. Spleen: Spleen measures 9.5 cm in length with normal signal intensity. Adrenals/Urinary Tract: Bilateral simple T2 hyperintense renal cortical cysts which does not require imaging  follow-up. No hydronephrosis. Both adrenal glands are unremarkable. Stomach/Bowel: Visualized portions within the abdomen are unremarkable. Vascular/Lymphatic: No pathologically enlarged lymph nodes identified. No abdominal aortic aneurysm demonstrated. Other:  Small fat containing umbilical hernia partially visualized Musculoskeletal: No suspicious bone lesions  identified. IMPRESSION: Gallbladder wall thickening with intraluminal layering stones/sludge. Prominent common bile duct with questionable nonobstructive intraluminal filling defects in distal CBD, possibly sludge. Punctate choledocholithiasis cannot be excluded. Cardiomegaly. Bilateral pleural effusions. Bilateral renal cortical cysts which does not require imaging follow-up. Electronically Signed   By: Megan  Zare M.D.   On: 09/08/2023 12:30   MR 3D Recon At Scanner Result Date: 09/08/2023 CLINICAL DATA:  Rule out choledocholithiasis EXAM: MRI ABDOMEN WITHOUT CONTRAST  (INCLUDING MRCP) TECHNIQUE: Multiplanar multisequence MR imaging of the abdomen was performed. Heavily T2-weighted images of the biliary and pancreatic ducts were obtained, and three-dimensional MRCP images were rendered by post processing. COMPARISON:  Ultrasound abdomen September 07, 2023 small right and trace left FINDINGS: Lower chest: Small right and trace left pleural effusions. Cardiomegaly. Hepatobiliary/MRCP: Mildly nodular liver morphology. No significant focal in liver lesion. Liver measures 12.8 cm in craniocaudal dimension. No significant hepatic steatosis. Gallbladder demonstrate increased wall thickness. Layer of intraluminal tiny stones/sludge along the posterior wall. No pericholecystic fluid. Phrygian cap identified. Common bile duct measures up to 6.5 mm, normal for age. There are a few filling defects overlying distal CBD on some of the MRCP images for example image 11/5 which may represent intraluminal sludge versus tiny nonobstructive choledocholithiasis or represent artifact. Pancreas: No mass, inflammatory changes, or other parenchymal abnormality identified. No pancreas divisum. Pancreatic duct is normal. Spleen: Spleen measures 9.5 cm in length with normal signal intensity. Adrenals/Urinary Tract: Bilateral simple T2 hyperintense renal cortical cysts which does not require imaging follow-up. No hydronephrosis. Both adrenal  glands are unremarkable. Stomach/Bowel: Visualized portions within the abdomen are unremarkable. Vascular/Lymphatic: No pathologically enlarged lymph nodes identified. No abdominal aortic aneurysm demonstrated. Other:  Small fat containing umbilical hernia partially visualized Musculoskeletal: No suspicious bone lesions identified. IMPRESSION: Gallbladder wall thickening with intraluminal layering stones/sludge. Prominent common bile duct with questionable nonobstructive intraluminal filling defects in distal CBD, possibly sludge. Punctate choledocholithiasis cannot be excluded. Cardiomegaly. Bilateral pleural effusions. Bilateral renal cortical cysts which does not require imaging follow-up. Electronically Signed   By: Megan  Zare M.D.   On: 09/08/2023 12:30    Anti-infectives: Anti-infectives (From admission, onward)    None        Assessment/Plan Acute biliary pancreatitis  - CT stone study from 09/07/2023 showed cholelithiasis without superimposed pericholecystic inflammatory change. No intra or extrahepatic biliary ductal dilation. - Concern for possible choledocholithiasis on US  from 09/07/2023 - MRCP completed 09/08/2023 that showed gallbladder wall thickening with intraluminal layering stones/sludge. Prominent common bile duct with questionable nonobstructive intraluminal filling defects in distal CBD, possibly sludge. Punctate choledocholithiasis cannot be excluded. - Lipase >2800 on admit - WBC 4.9 - AST 335 from 751; ALT 366 from 588; Alkaline Phosphatase 189 from 162; Total bilirubin 5.6 from 5.2 Creatinine 1.23 from 0.95 - GI offered ERCP. Hopefully, will be completed today, but this is pending availability/scheduling. - Surgery will continue to follow and assess readiness for cholecystectomy pending clinical progress and patient's hesitance with surgery.     FEN: NPO, 0.9% sodium chloride  infusion at 20 mL/hr  VTE: Heparin  infusion (Will need to be held for 6 hours prior to ERCP  per GI) ID: No current abx    - per [facility] -  HTN HLD Chronic diastolic  CHF Atrial fibrillation CKD stage III T2DM GERD PAD Hypothyroidism    LOS: 2 days   I reviewed specialty notes, hospitalist notes, last 24 h vitals and pain scores, last 48 h intake and output, last 24 h labs and trends, and last 24 h imaging results.  This care required moderate level of medical decision making.    Marjorie Carlyon Favre, [facility] Surgery 09/09/2023, 9:53 AM Please see Amion for pager number during day hours 7:00am-4:30pm

## 2023-09-09 NOTE — Plan of Care (Signed)
  Problem: Health Behavior/Discharge Planning: Goal: Ability to manage health-related needs will improve Outcome: Progressing   Problem: Clinical Measurements: Goal: Ability to maintain clinical measurements within normal limits will improve Outcome: Progressing Goal: Will remain free from infection Outcome: Progressing Goal: Diagnostic test results will improve Outcome: Progressing Goal: Cardiovascular complication will be avoided Outcome: Progressing   Problem: Activity: Goal: Risk for activity intolerance will decrease Outcome: Progressing   Problem: Nutrition: Goal: Adequate nutrition will be maintained Outcome: Progressing   Problem: Skin Integrity: Goal: Risk for impaired skin integrity will decrease Outcome: Adequate for Discharge   Problem: Nutritional: Goal: Progress toward achieving an optimal weight will improve Outcome: Progressing   Problem: Skin Integrity: Goal: Risk for impaired skin integrity will decrease Outcome: Adequate for Discharge   Problem: Tissue Perfusion: Goal: Adequacy of tissue perfusion will improve Outcome: Adequate for Discharge   Problem: Education: Goal: Knowledge of Pancreatitis treatment and prevention will improve Outcome: Progressing   Problem: Nutritional: Goal: Ability to achieve adequate nutritional intake will improve Outcome: Progressing   Problem: Clinical Measurements: Goal: Complications related to the disease process, condition or treatment will be avoided or minimized Outcome: Progressing

## 2023-09-09 NOTE — Progress Notes (Signed)
 PROGRESS NOTE    Nicole Jimenez  FMW:[id number] DOB: 01/21/1935 DOA: 09/07/2023 PCP: Jodie Lavern LITTIE, MD   Brief Narrative:  HPI: Nicole Jimenez is a 89 y.o. female with history of A-fib, hypertension, hyperlipidemia, chronic kidney disease stage III, diabetes mellitus type 2, thrombocytopenia was brought to the ER after patient started complaining of severe pain with abdominal pain radiating to her back and across the abdomen with nausea vomiting since last night.  Had a similar episode about a month ago which was self-limited.  Denies any diarrhea fever or chills.  Denies drinking alcohol.   ED Course: In the ER patient's labs show elevated lipase with mildly elevated AST ALT and total bilirubin of 1.4.  CT abdomen pelvis shows cholelithiasis and ultrasound abdomen shows which is concerning for choledocholithiasis.  Gallbladder thickening was present but no Murphy sign.  ER physician discussed the [facility] gastroenterologist.  Placed patient on IV fluids and pain relief medications admitted for acute pancreatitis likely secondary to gallstones.  Assessment & Plan:   Principal Problem:   Acute pancreatitis Active Problems:   Essential hypertension   Type 2 diabetes mellitus with peripheral neuropathy (HCC)   Permanent atrial fibrillation (HCC)   Thrombocytopenia (HCC)   Chronic kidney disease, stage 3b (HCC)  Acute gallstone pancreatitis/acute choledocholithiasis/dilated CBD/elevated LFTs/possible acute cholecystitis, POA: Seen by GI as well as general surgery.  Due to rising LFTs and bilirubin, MRCP completed which raises concern for choledocholithiasis.  Bilirubin continues to rise.  GI on board, they are planning to do ERCP.  General surgery does not suspect acute cholecystitis.  However patient will need cholecystectomy once medically ready.  Appreciate both GI and general surgery help.  Paroxysmal atrial fibrillation: Rates controlled, continue Coreg  and Cardizem .  Holding Eliquis .   Continue heparin  in the meantime.  Essential hypertension: Controlled.  Continue Cardizem  and Coreg .  CKD stage IIIb/IV: GFR labile remaining in the range of stage IIIb and IV.  Currently at baseline.  Hyperlipidemia:  on statins but due to LFTs, will discontinue that for now.  Chronic thrombocytopenia: Stable, no signs of bleeding.  Follow CBC.  Type 2 diabetes mellitus: Takes Jardiance  at home.  Last hemoglobin A1c 6.5.  Continue SSI only.  DVT prophylaxis: Heparin    Code Status: Full Code  Family Communication:  None present at bedside.  Plan of care discussed with patient in length and he/she verbalized understanding and agreed with it.  Status is: Inpatient Remains inpatient appropriate because: Improving pancreatitis, scheduled for ERCP today and will need cholecystectomy during this hospitalization.   Estimated body mass index is 24.2 kg/m as calculated from the following:   Height as of this encounter: 5' 1 (1.549 m).   Weight as of this encounter: 58.1 kg.    Nutritional Assessment: Body mass index is 24.2 kg/m.SABRA Seen by dietician.  I agree with the assessment and plan as outlined below: Nutrition Status:        . Skin Assessment: I have examined the patient's skin and I agree with the wound assessment as performed by the wound care RN as outlined below:    Consultants:  GI and general surgery  Procedures:  As above  Antimicrobials:  Anti-infectives (From admission, onward)    None         Subjective: Patient seen and examined.  She has no complaints.  Patient was being evaluated by general surgery PA when I saw her.  Patient very apprehensive and is scared of having any procedures at all.  She asked lots of questions, all of those questions were addressed and she was reassured that she will get the best care and she will be assessed by anesthesiologist and that GI physician will also discussed with her about the details of the procedure before moving  forward today.  Objective: Vitals:   09/08/23 0417 09/08/23 1334 09/08/23 2210 09/09/23 0634  BP: (!) 140/61 131/79 139/67 116/62  Pulse: 70 86 78 62  Resp: 18 16 16 18   Temp: 98.1 F (36.7 C) 98.6 F (37 C) 98.2 F (36.8 C) 98 F (36.7 C)  TempSrc: Oral Oral    SpO2: 97% 96% 98% 94%  Weight:      Height:        Intake/Output Summary (Last 24 hours) at 09/09/2023 0821 Last data filed at 09/09/2023 0600 Gross per 24 hour  Intake 536.01 ml  Output --  Net 536.01 ml   Filed Weights   09/07/23 0021 09/07/23 0443  Weight: 58.1 kg 58.1 kg    Examination:  General exam: Appears calm and comfortable  Respiratory system: Clear to auscultation. Respiratory effort normal. Cardiovascular system: S1 & S2 heard, RRR. No JVD, murmurs, rubs, gallops or clicks. No pedal edema. Gastrointestinal system: Abdomen is nondistended, soft and nontender. No organomegaly or masses felt. Normal bowel sounds heard. Central nervous system: Alert and oriented. No focal neurological deficits. Extremities: Symmetric 5 x 5 power. Skin: No rashes, lesions or ulcers.  Psychiatry: Judgement and insight appear normal. Mood & affect appropriate.    Data Reviewed: I have personally reviewed following labs and imaging studies  CBC: Recent Labs  Lab 09/07/23 0029 09/07/23 0734 09/08/23 0444 09/09/23 0332  WBC 8.7 6.9 7.2 4.9  NEUTROABS 6.9 6.2  --   --   HGB 13.2 12.3 12.0 11.4*  HCT 40.9 39.2 37.7 36.4  MCV 88.7 89.5 90.8 90.5  PLT 133* 102* 92* 83*   Basic Metabolic Panel: Recent Labs  Lab 09/07/23 0029 09/07/23 0734 09/08/23 0444 09/09/23 0332  NA 138 136 135 135  K 3.5 3.3* 2.9* 3.7  CL 101 104 103 106  CO2 23 24 20* 19*  GLUCOSE 169* 137* 100* 130*  BUN 34* 29* 24* 22  CREATININE 1.46* 1.18* 0.95 1.23*  CALCIUM  9.4 8.8* 8.5* 8.8*   GFR: Estimated Creatinine Clearance: 26.9 mL/min (A) (by C-G formula based on SCr of 1.23 mg/dL (H)). Liver Function Tests: Recent Labs  Lab  09/07/23 0029 09/07/23 0734 09/08/23 0444 09/09/23 0332  AST 146* 1,971* 751* 335*  ALT 56* 831* 588* 366*  ALKPHOS 167* 175* 162* 189*  BILITOT 1.4* 2.6* 5.2* 5.6*  PROT 7.9 7.3 7.1 6.7  ALBUMIN 4.2 3.3* 3.0* 2.8*   Recent Labs  Lab 09/07/23 0029 09/09/23 0332  LIPASE >2,800* 35   No results for input(s): AMMONIA in the last 168 hours. Coagulation Profile: No results for input(s): INR, PROTIME in the last 168 hours. Cardiac Enzymes: No results for input(s): CKTOTAL, CKMB, CKMBINDEX, TROPONINI in the last 168 hours. BNP (last 3 results) No results for input(s): PROBNP in the last 8760 hours. HbA1C: No results for input(s): HGBA1C in the last 72 hours. CBG: Recent Labs  Lab 09/08/23 1539 09/08/23 2005 09/09/23 0004 09/09/23 0633 09/09/23 0742  GLUCAP 75 206* 118* 111* 117*   Lipid Profile: Recent Labs    09/07/23 0734  TRIG 38   Thyroid  Function Tests: No results for input(s): TSH, T4TOTAL, FREET4, T3FREE, THYROIDAB in the last 72 hours. Anemia Panel: No results  for input(s): VITAMINB12, FOLATE, FERRITIN, TIBC, IRON, RETICCTPCT in the last 72 hours. Sepsis Labs: Recent Labs  Lab 09/07/23 1430  LATICACIDVEN 1.4    No results found for this or any previous visit (from the past 240 hours).   Radiology Studies: MR ABDOMEN MRCP WO CONTRAST Result Date: 09/08/2023 CLINICAL DATA:  Rule out choledocholithiasis EXAM: MRI ABDOMEN WITHOUT CONTRAST  (INCLUDING MRCP) TECHNIQUE: Multiplanar multisequence MR imaging of the abdomen was performed. Heavily T2-weighted images of the biliary and pancreatic ducts were obtained, and three-dimensional MRCP images were rendered by post processing. COMPARISON:  Ultrasound abdomen September 07, 2023 small right and trace left FINDINGS: Lower chest: Small right and trace left pleural effusions. Cardiomegaly. Hepatobiliary/MRCP: Mildly nodular liver morphology. No significant focal in liver lesion.  Liver measures 12.8 cm in craniocaudal dimension. No significant hepatic steatosis. Gallbladder demonstrate increased wall thickness. Layer of intraluminal tiny stones/sludge along the posterior wall. No pericholecystic fluid. Phrygian cap identified. Common bile duct measures up to 6.5 mm, normal for age. There are a few filling defects overlying distal CBD on some of the MRCP images for example image 11/5 which may represent intraluminal sludge versus tiny nonobstructive choledocholithiasis or represent artifact. Pancreas: No mass, inflammatory changes, or other parenchymal abnormality identified. No pancreas divisum. Pancreatic duct is normal. Spleen: Spleen measures 9.5 cm in length with normal signal intensity. Adrenals/Urinary Tract: Bilateral simple T2 hyperintense renal cortical cysts which does not require imaging follow-up. No hydronephrosis. Both adrenal glands are unremarkable. Stomach/Bowel: Visualized portions within the abdomen are unremarkable. Vascular/Lymphatic: No pathologically enlarged lymph nodes identified. No abdominal aortic aneurysm demonstrated. Other:  Small fat containing umbilical hernia partially visualized Musculoskeletal: No suspicious bone lesions identified. IMPRESSION: Gallbladder wall thickening with intraluminal layering stones/sludge. Prominent common bile duct with questionable nonobstructive intraluminal filling defects in distal CBD, possibly sludge. Punctate choledocholithiasis cannot be excluded. Cardiomegaly. Bilateral pleural effusions. Bilateral renal cortical cysts which does not require imaging follow-up. Electronically Signed   By: Megan  Zare M.D.   On: 09/08/2023 12:30   MR 3D Recon At Scanner Result Date: 09/08/2023 CLINICAL DATA:  Rule out choledocholithiasis EXAM: MRI ABDOMEN WITHOUT CONTRAST  (INCLUDING MRCP) TECHNIQUE: Multiplanar multisequence MR imaging of the abdomen was performed. Heavily T2-weighted images of the biliary and pancreatic ducts were  obtained, and three-dimensional MRCP images were rendered by post processing. COMPARISON:  Ultrasound abdomen September 07, 2023 small right and trace left FINDINGS: Lower chest: Small right and trace left pleural effusions. Cardiomegaly. Hepatobiliary/MRCP: Mildly nodular liver morphology. No significant focal in liver lesion. Liver measures 12.8 cm in craniocaudal dimension. No significant hepatic steatosis. Gallbladder demonstrate increased wall thickness. Layer of intraluminal tiny stones/sludge along the posterior wall. No pericholecystic fluid. Phrygian cap identified. Common bile duct measures up to 6.5 mm, normal for age. There are a few filling defects overlying distal CBD on some of the MRCP images for example image 11/5 which may represent intraluminal sludge versus tiny nonobstructive choledocholithiasis or represent artifact. Pancreas: No mass, inflammatory changes, or other parenchymal abnormality identified. No pancreas divisum. Pancreatic duct is normal. Spleen: Spleen measures 9.5 cm in length with normal signal intensity. Adrenals/Urinary Tract: Bilateral simple T2 hyperintense renal cortical cysts which does not require imaging follow-up. No hydronephrosis. Both adrenal glands are unremarkable. Stomach/Bowel: Visualized portions within the abdomen are unremarkable. Vascular/Lymphatic: No pathologically enlarged lymph nodes identified. No abdominal aortic aneurysm demonstrated. Other:  Small fat containing umbilical hernia partially visualized Musculoskeletal: No suspicious bone lesions identified. IMPRESSION: Gallbladder wall thickening with intraluminal layering  stones/sludge. Prominent common bile duct with questionable nonobstructive intraluminal filling defects in distal CBD, possibly sludge. Punctate choledocholithiasis cannot be excluded. Cardiomegaly. Bilateral pleural effusions. Bilateral renal cortical cysts which does not require imaging follow-up. Electronically Signed   By: Megan  Zare M.D.    On: 09/08/2023 12:30    Scheduled Meds:  carvedilol   12.5 mg Oral BID   diltiazem   180 mg Oral q morning   insulin  aspart  0-6 Units Subcutaneous Q4H   Continuous Infusions:  sodium chloride      heparin  Stopped (09/09/23 0817)     LOS: 2 days   Fredia Skeeter, MD [facility] Total time spent: 40 minutes  09/09/2023, 8:21 AM   *Please note that this is a verbal dictation therefore any spelling or grammatical errors are due to the Dragon Medical One system interpretation.  Please page via Amion and do not message via secure chat for urgent patient care matters. Secure chat can be used for non urgent patient care matters.  How to contact the [facility] Attending or Consulting provider 7A - 7P or covering provider during after hours 7P -7A, for this patient?  Check the care team in [facility] and look for a) attending/consulting [facility] provider listed and b) the [facility] team listed. Page or secure chat 7A-7P. Log into [email or web address] and use [facility]'s universal password to access. If you do not have the password, please contact the hospital operator. Locate the [facility] provider you are looking for under [facility] and page to a number that you can be directly reached. If you still have difficulty reaching the provider, please page the [facility] (Director on Call) for the Hospitalists listed on amion for assistance.

## 2023-09-09 NOTE — Progress Notes (Signed)
 PHARMACY - ANTICOAGULATION CONSULT NOTE  Pharmacy Consult for heparin   Indication: atrial fibrillation  Allergies  Allergen Reactions   Amoxicillin-Pot Clavulanate Other (See Comments)    unknown   Amoxicillin Other (See Comments)    unknown   Clarithromycin Other (See Comments)    unknown    Patient Measurements: Height: 5' 1 (154.9 cm) Weight: 58.1 kg (128 lb) IBW/kg (Calculated) : 47.8 HEPARIN  DW (KG): 58.1  Vital Signs: Temp: 98 F (36.7 C) (08/01 1600) Temp Source: Temporal (08/01 1600) BP: 144/79 (08/01 1659) Pulse Rate: 61 (08/01 1659)  Labs: Recent Labs    09/07/23 0734 09/07/23 1753 09/08/23 0444 09/08/23 1900 09/09/23 0332  HGB 12.3  --  12.0  --  11.4*  HCT 39.2  --  37.7  --  36.4  PLT 102*  --  92*  --  83*  APTT 38*   < > >200* 86* 68*  HEPARINUNFRC >1.10*  --  0.80*  --  0.14*  CREATININE 1.18*  --  0.95  --  1.23*   < > = values in this interval not displayed.    Estimated Creatinine Clearance: 26.9 mL/min (A) (by C-G formula based on SCr of 1.23 mg/dL (H)).  Assessment: Pharmacy consulted to manage heparin  bridge therapy for Afib in 89 yo F on apixaban  2.5 mg BID PTA; last dose - 7/29 evening around 9 pm. PMH thrombocytopenia, CKD3b, Afib, HTN, HLD, DM2. Baseline aPTT 38, baseline HL falsely elevated d/t recent DOAC use.  09/09/2023 AM aPTT 68 sec - remains therapeutic on heparin  drip @ 500 units/hr AM heparin  level 0.14 - below goal on heparin  @ 500 units/hr No bleeding, complications, or IV site issues reported by RN.  Hg 11.4 - stable PLT 83 - low but stable (hx thrombocytopenia)  Heparin  turned off at 0815 am for possible ERCP later today  PM update    GI note indicated to wait till midnight to resume heparin  but to check with surgery in case procedure may happen tomorrow. Spoke with Dr. Signe says they may operate in AM so surgery prefers to just hold heparin  over night so will not resume till reevaluated by surgery in AM   Goal of  Therapy:  Heparin  level 0.3-0.7 units/ml aPTT 66-102 seconds Monitor platelets by anticoagulation protocol: Yes   Plan:  Hold heparin  pending AM evaluation or possible surgery  F/u if/when heparin  to be resumed with surgery  Daily CBC, heparin  level  Dolphus Roller, PharmD, BCPS 09/09/2023 5:09 PM

## 2023-09-09 NOTE — Interval H&P Note (Signed)
 History and Physical Interval Note:  09/09/2023 2:46 PM  Nicole Jimenez  has presented today for surgery, with the diagnosis of Abnormal LFTs, choledocholithiasis, gallstone pancreatitis.  The various methods of treatment have been discussed with the patient and family. After consideration of risks, benefits and other options for treatment, the patient has consented to  Procedure(s): ERCP, WITH INTERVENTION IF INDICATED (N/A) as a surgical intervention.  The patient's history has been reviewed, patient examined, no change in status, stable for surgery.  I have reviewed the patient's chart and labs.  Questions were answered to the patient's satisfaction.    The risks of an ERCP were discussed at length, including but not limited to the risk of perforation, bleeding, abdominal pain, post-ERCP pancreatitis (while usually mild can be severe and even life threatening).  In the setting of the patient's persisting LFT abnormalities and progressive bilirubin elevation, pretest probability of underlying obstructive choledocholithiasis remains.  ERCP makes sense for us  to pursue in the setting of bilirubin greater than 4.  Patient aware that if we are unsuccessful, repeat ERCP versus percutaneous approach may be required.  Govanni Plemons Mansouraty Jr

## 2023-09-09 NOTE — Transfer of Care (Signed)
 Immediate Anesthesia Transfer of Care Note  Patient: Nicole Jimenez  Procedure(s) Performed: ERCP, WITH INTERVENTION IF INDICATED  Patient Location: PACU  Anesthesia Type:General  Level of Consciousness: drowsy and patient cooperative  Airway & Oxygen Therapy: Patient Spontanous Breathing and Patient connected to face mask oxygen  Post-op Assessment: Report given to RN and Post -op Vital signs reviewed and stable  Post vital signs: Reviewed and stable  Last Vitals:  Vitals Value Taken Time  BP 139/61 09/09/23 16:00  Temp    Pulse 45 09/09/23 16:02  Resp 15 09/09/23 16:02  SpO2 98 % 09/09/23 16:02  Vitals shown include unfiled device data.  Last Pain:  Vitals:   09/09/23 1323  TempSrc: Temporal  PainSc: 0-No pain      Patients Stated Pain Goal: 0 (09/09/23 1323)  Complications: No notable events documented.

## 2023-09-09 NOTE — Anesthesia Procedure Notes (Signed)
 Procedure Name: Intubation Date/Time: 09/09/2023 3:01 PM  Performed by: Cena Epps, CRNAPre-anesthesia Checklist: Patient identified, Emergency Drugs available, Suction available and Patient being monitored Patient Re-evaluated:Patient Re-evaluated prior to induction Oxygen Delivery Method: Circle System Utilized Preoxygenation: Pre-oxygenation with 100% oxygen Induction Type: IV induction Ventilation: Mask ventilation without difficulty Laryngoscope Size: Mac, 3 and Glidescope (DL X1, grade 3 view, changed to glide scope 3, grade 1 view) Grade View: Grade I Tube type: Oral Tube size: 7.0 mm Number of attempts: 2 Airway Equipment and Method: Stylet and Oral airway Placement Confirmation: ETT inserted through vocal cords under direct vision, positive ETCO2 and breath sounds checked- equal and bilateral Secured at: 22 cm Tube secured with: Tape Dental Injury: Teeth and Oropharynx as per pre-operative assessment

## 2023-09-10 DIAGNOSIS — K851 Biliary acute pancreatitis without necrosis or infection: Secondary | ICD-10-CM | POA: Diagnosis not present

## 2023-09-10 DIAGNOSIS — R7989 Other specified abnormal findings of blood chemistry: Secondary | ICD-10-CM | POA: Diagnosis not present

## 2023-09-10 DIAGNOSIS — D696 Thrombocytopenia, unspecified: Secondary | ICD-10-CM | POA: Diagnosis not present

## 2023-09-10 DIAGNOSIS — K8020 Calculus of gallbladder without cholecystitis without obstruction: Secondary | ICD-10-CM | POA: Diagnosis not present

## 2023-09-10 DIAGNOSIS — K859 Acute pancreatitis without necrosis or infection, unspecified: Secondary | ICD-10-CM | POA: Diagnosis not present

## 2023-09-10 LAB — GLUCOSE, CAPILLARY
Glucose-Capillary: 136 mg/dL — ABNORMAL HIGH (ref 70–99)
Glucose-Capillary: 137 mg/dL — ABNORMAL HIGH (ref 70–99)
Glucose-Capillary: 138 mg/dL — ABNORMAL HIGH (ref 70–99)
Glucose-Capillary: 148 mg/dL — ABNORMAL HIGH (ref 70–99)
Glucose-Capillary: 162 mg/dL — ABNORMAL HIGH (ref 70–99)
Glucose-Capillary: 167 mg/dL — ABNORMAL HIGH (ref 70–99)

## 2023-09-10 LAB — COMPREHENSIVE METABOLIC PANEL WITH GFR
ALT: 275 U/L — ABNORMAL HIGH (ref 0–44)
AST: 160 U/L — ABNORMAL HIGH (ref 15–41)
Albumin: 2.6 g/dL — ABNORMAL LOW (ref 3.5–5.0)
Alkaline Phosphatase: 190 U/L — ABNORMAL HIGH (ref 38–126)
Anion gap: 6 (ref 5–15)
BUN: 20 mg/dL (ref 8–23)
CO2: 23 mmol/L (ref 22–32)
Calcium: 9 mg/dL (ref 8.9–10.3)
Chloride: 109 mmol/L (ref 98–111)
Creatinine, Ser: 1.36 mg/dL — ABNORMAL HIGH (ref 0.44–1.00)
GFR, Estimated: 38 mL/min — ABNORMAL LOW (ref >60)
Glucose, Bld: 147 mg/dL — ABNORMAL HIGH (ref 70–99)
Potassium: 4 mmol/L (ref 3.5–5.1)
Sodium: 138 mmol/L (ref 135–145)
Total Bilirubin: 4.8 mg/dL — ABNORMAL HIGH (ref 0.0–1.2)
Total Protein: 6.7 g/dL (ref 6.5–8.1)

## 2023-09-10 LAB — CBC
HCT: 40.4 % (ref 36.0–46.0)
Hemoglobin: 12.6 g/dL (ref 12.0–15.0)
MCH: 28.1 pg (ref 26.0–34.0)
MCHC: 31.2 g/dL (ref 30.0–36.0)
MCV: 90.2 fL (ref 80.0–100.0)
Platelets: 86 K/uL — ABNORMAL LOW (ref 150–400)
RBC: 4.48 MIL/uL (ref 3.87–5.11)
RDW: 14 % (ref 11.5–15.5)
WBC: 4.6 K/uL (ref 4.0–10.5)
nRBC: 0 % (ref 0.0–0.2)

## 2023-09-10 LAB — BILIRUBIN, DIRECT: Bilirubin, Direct: 2.9 mg/dL — ABNORMAL HIGH (ref 0.0–0.2)

## 2023-09-10 LAB — HEPATITIS B SURFACE ANTIGEN: Hepatitis B Surface Ag: NONREACTIVE

## 2023-09-10 LAB — HEPATITIS C ANTIBODY: HCV Ab: NONREACTIVE

## 2023-09-10 LAB — HEPARIN LEVEL (UNFRACTIONATED): Heparin Unfractionated: <0.1 [IU]/mL — ABNORMAL LOW (ref 0.30–0.70)

## 2023-09-10 LAB — HEPATITIS B CORE ANTIBODY, IGM: Hep B C IgM: NONREACTIVE

## 2023-09-10 LAB — APTT: aPTT: 32 s (ref 24–36)

## 2023-09-10 LAB — HEPATITIS A ANTIBODY, IGM: Hep A IgM: NONREACTIVE

## 2023-09-10 LAB — HEPATITIS B SURFACE ANTIBODY,QUALITATIVE: Hep B S Ab: NONREACTIVE

## 2023-09-10 LAB — HEPATITIS A ANTIBODY, TOTAL: hep A Total Ab: REACTIVE — AB

## 2023-09-10 MED ORDER — INDOCYANINE GREEN 25 MG IV SOLR
7.5000 mg | Freq: Once | INTRAVENOUS | Status: DC
  Filled 2023-09-10: qty 10

## 2023-09-10 MED ORDER — CHLORHEXIDINE GLUCONATE CLOTH 2 % EX PADS
6.0000 | MEDICATED_PAD | Freq: Once | CUTANEOUS | Status: AC
  Administered 2023-09-10: 6 via TOPICAL

## 2023-09-10 MED ORDER — CHLORHEXIDINE GLUCONATE CLOTH 2 % EX PADS
6.0000 | MEDICATED_PAD | Freq: Once | CUTANEOUS | Status: AC
  Administered 2023-09-11: 6 via TOPICAL

## 2023-09-10 MED ORDER — HEPARIN (PORCINE) 25000 UT/250ML-% IV SOLN
500.0000 [IU]/h | INTRAVENOUS | Status: AC
  Administered 2023-09-10: 500 [IU]/h via INTRAVENOUS

## 2023-09-10 MED ORDER — CEFAZOLIN SODIUM-DEXTROSE 2-4 GM/100ML-% IV SOLN
2.0000 g | INTRAVENOUS | Status: AC
  Administered 2023-09-11: 2 g via INTRAVENOUS
  Filled 2023-09-10: qty 100

## 2023-09-10 NOTE — Progress Notes (Signed)
 1 Day Post-Op   Subjective/Chief Complaint: Patient doing well today.  Had her ERCP yesterday.  She states she would like a day between procedures and does not want surgery today.   Objective: Vital signs in last 24 hours: Temp:  [97.5 F (36.4 C)-98.1 F (36.7 C)] 97.9 F (36.6 C) (08/02 1004) Pulse Rate:  [57-88] 59 (08/02 1006) Resp:  [13-23] 16 (08/02 1004) BP: (139-169)/(61-102) 158/72 (08/02 1006) SpO2:  [95 %-99 %] 97 % (08/02 1004) Weight:  [58.1 kg] 58.1 kg (08/01 1323) Last BM Date : 09/08/23  Intake/Output from previous day: 08/01 0701 - 08/02 0700 In: 1050 [P.O.:480; I.V.:370; IV Piggyback:200] Out: 2 [Blood:2] Intake/Output this shift: No intake/output data recorded.  Abdomen: Soft nontender without rebound or guarding  Lab Results:  Recent Labs    09/09/23 0332 09/10/23 0423  WBC 4.9 4.6  HGB 11.4* 12.6  HCT 36.4 40.4  PLT 83* 86*   BMET Recent Labs    09/09/23 0332 09/10/23 0423  NA 135 138  K 3.7 4.0  CL 106 109  CO2 19* 23  GLUCOSE 130* 147*  BUN 22 20  CREATININE 1.23* 1.36*  CALCIUM  8.8* 9.0   PT/INR No results for input(s): LABPROT, INR in the last 72 hours. ABG No results for input(s): PHART, HCO3 in the last 72 hours.  Invalid input(s): PCO2, PO2  Studies/Results: No results found.  Anti-infectives: Anti-infectives (From admission, onward)    None       Assessment/Plan: s/p ERCP Acute biliary pancreatitis  - ERCP yesterday.  Patient desires surgery tomorrow and will post for a.m. for laparoscopic cholecystectomy.  Resume diet.  May restart heparin .  Discussed with the nursing staff to stop heparin  at midnight.  N.p.o. after midnight.  I reviewed the procedure with the patient and answered her questions today.The procedure has been discussed with the patient. Operative and non operative treatments have been discussed. Risks of surgery include bleeding, infection,  Common bile duct injury,  Injury to the  stomach,liver, colon,small intestine, abdominal wall,  Diaphragm,  Major blood vessels,  And the need for an open procedure.  Other risks include worsening of medical problems, death,  DVT and pulmonary embolism, and cardiovascular events.   Medical options have also been discussed. The patient has been informed of long term expectations of surgery and non surgical options,  The patient agrees to proceed.         FEN: NPO, 0.9% sodium chloride  infusion at 20 mL/hr  VTE: Heparin  infusion (Will need to be held for 6 hours prior to ERCP per GI) ID: No current abx     - per [facility] -  HTN HLD Chronic diastolic CHF Atrial fibrillation CKD stage III T2DM GERD PAD Hypothyroidism      LOS: 2 days    I reviewed specialty notes, hospitalist notes, last 24 h vitals and pain scores, last 48 h intake and output, last 24 h labs and trends, and last 24 h imaging results.   This care required moderate level of medical decision making.     LOS: 3 days    Debby DELENA Shipper MD 09/10/2023

## 2023-09-10 NOTE — Progress Notes (Signed)
 Gastroenterology Inpatient Follow-up Note   PATIENT IDENTIFICATION  Nicole Jimenez is a 89 y.o. female Hospital Day: 4  SUBJECTIVE  The patient's chart has been reviewed. The patient's labs have been reviewed.  LFTs have downtrended. Today, the patient is doing and feeling well today. The patient denies fevers or chills. She is hungry but she is remaining NPO. She is amenable to consideration of surgery/cholecystectomy at this time, if the surgical team feels that she is an adequate candidate.  She just wonders if she can have procedures back-to-back or if needs sometime between procedures. She thinks that her urine is getting slightly lighter.   OBJECTIVE   Scheduled Inpatient Medications:   carvedilol   12.5 mg Oral BID   diltiazem   180 mg Oral q morning   insulin  aspart  0-6 Units Subcutaneous Q4H   pantoprazole   40 mg Oral Daily   Continuous Inpatient Infusions:  PRN Inpatient Medications: fentaNYL  (SUBLIMAZE ) injection, hydrALAZINE , ondansetron  (ZOFRAN ) IV   Physical Examination   Temp:  [97.5 F (36.4 C)-98.1 F (36.7 C)] 97.8 F (36.6 C) (08/02 0418) Pulse Rate:  [57-88] 76 (08/02 0418) Resp:  [13-23] 14 (08/02 0418) BP: (139-168)/(61-90) 141/90 (08/02 0418) SpO2:  [95 %-99 %] 96 % (08/02 0418) Weight:  [58.1 kg] 58.1 kg (08/01 1323) Temp (24hrs), Avg:97.8 F (36.6 C), Min:97.5 F (36.4 C), Max:98.1 F (36.7 C)  Weight: 58.1 kg GEN: NAD, appears stated age, doesn't appear chronically ill PSYCH: Cooperative, without pressured speech EYE: Icteric sclerae ENT: MMM CV: Nontachycardic RESP: No audible wheezing GI: NABS, soft, NT/ND, without rebound or guarding MSK/EXT: No significant lower extremity edema SKIN: Jaundiced NEURO:  Alert & Oriented x 3, no focal deficits   Review of Data   Laboratory Studies   Recent Labs  Lab 09/10/23 0423  NA 138  K 4.0  CL 109  CO2 23  BUN 20  CREATININE 1.36*  GLUCOSE 147*  CALCIUM  9.0   Recent Labs   Lab 09/10/23 0423  AST 160*  ALT 275*  ALKPHOS 190*    Recent Labs  Lab 09/08/23 0444 09/09/23 0332 09/10/23 0423  WBC 7.2 4.9 4.6  HGB 12.0 11.4* 12.6  HCT 37.7 36.4 40.4  PLT 92* 83* 86*   Recent Labs  Lab 09/09/23 0332  APTT 68*   Imaging Studies  No results found.  GI Procedures and Studies  ERCP - Erythematous mucosa in the stomach. Biopsy for H. pylori assessment. - No gross lesions in the duodenal bulb, in the first portion of the duodenum and in the second portion of the duodenum. - The major papilla appeared normal, though hidden under a large could and fold. - The fluoroscopic examination was suspicious for sludge. - A biliary sphincterotomy was performed. Balloon sphincteroplasty performed as well. - The biliary tree was swept and sludge was found. - Cholelithiasis noted on fluoroscopic images within the gallbladder.   ASSESSMENT  Nicole Jimenez is a 89 y.o. female with a pmh significant for hypertension, hyperlipidemia, diabetes, PAD, thyroid  disease, MDD, arthritis, GERD, symptomatic cholelithiasis.  Patient admitted with symptomatic cholelithiasis and concern for cholecystitis now status post ERCP pending possible cholecystectomy.  The patient is clinically and hemodynamically well today.  No evidence of post ERCP complications.  She has had longer standing thrombocytopenia.  No overt signs of cirrhosis or portal hypertension on her examination at this time.  Her presentation is gallstone pancreatitis in nature.  LFTs may take a few more days to improve.  I think cholecystectomy  makes the most sense in this patient to try to prevent her from having further issues in the future, but certainly a decision that our surgeons will have to make with her.  From my standpoint she could advance her diet as tolerated, but if potential surgery is to occur over this weekend, would maintain n.p.o. status until she has a chance to meet the surgeons.  She can see us  in follow-up in  clinic should she decide she wants to in the coming months.  No plan for further evaluation at this time.  I will update her/family if she is in or out of the hospital once pathology from the gastric biopsies returned.  All patient questions were answered to the best of my ability, and the patient agrees to the aforementioned plan of action with follow-up as indicated.   PLAN/RECOMMENDATIONS  Trend LFTs while in-house N.p.o. for consideration by surgeons of possible cholecystectomy Continue PPI therapy May restart DOAC on 8/3 if no plans for surgical procedures After she gets through this hospitalization, consideration of hematology evaluation for thrombocytopenia can be had Hepatitis serologies to be added on Follow-up in GI clinic can be arranged if she desires I will follow-up gastric biopsy pathology   Inpatient GI service will sign off at this time.  Please page/call with questions or concerns.   Aloha Finner, MD [facility] Office # [phone]    LOS: 3 days  Aloha Finner Raddle  09/10/2023, 9:03 AM

## 2023-09-10 NOTE — Progress Notes (Signed)
 PROGRESS NOTE    Nicole Jimenez  FMW:[id number] DOB: 28-Sep-1934 DOA: 09/07/2023 PCP: Jodie Lavern LITTIE, MD   Brief Narrative:  HPI: Nicole Jimenez is a 89 y.o. female with history of A-fib, hypertension, hyperlipidemia, chronic kidney disease stage III, diabetes mellitus type 2, thrombocytopenia was brought to the ER after patient started complaining of severe pain with abdominal pain radiating to her back and across the abdomen with nausea vomiting since last night.  Had a similar episode about a month ago which was self-limited.  Denies any diarrhea fever or chills.  Denies drinking alcohol.   ED Course: In the ER patient's labs show elevated lipase with mildly elevated AST ALT and total bilirubin of 1.4.  CT abdomen pelvis shows cholelithiasis and ultrasound abdomen shows which is concerning for choledocholithiasis.  Gallbladder thickening was present but no Murphy sign.  ER physician discussed the [facility] gastroenterologist.  Placed patient on IV fluids and pain relief medications admitted for acute pancreatitis likely secondary to gallstones.  Assessment & Plan:   Principal Problem:   Acute pancreatitis Active Problems:   Essential hypertension   Type 2 diabetes mellitus with peripheral neuropathy (HCC)   Permanent atrial fibrillation (HCC)   Thrombocytopenia (HCC)   Chronic kidney disease, stage 3b (HCC)   Gastritis without bleeding   Obstructive jaundice   Abnormal LFTs   Gallstone pancreatitis  Acute gallstone pancreatitis/acute choledocholithiasis/dilated CBD/elevated LFTs/possible acute cholecystitis, POA: Seen by GI as well as general surgery.  Due to rising LFTs and bilirubin, MRCP completed which raised concern for choledocholithiasis, patient ended up having ERCP by Dr. Wilhelmenia on 09/09/2023, biliary sludge removed with sphincteroplasty and sphincterotomy performed. Evidence of cholelithiasis on cholangiogram with filling of the gallbladder.  Slight improvement in  bilirubin and greater improvement in LFTs overall.  General surgery on board, plan for cholecystectomy today has been canceled and she is slotted to have surgery tomorrow morning.  She has been started on soft diet, I have started her on heparin  which will be stopped at midnight per general surgery recommendations.  Paroxysmal atrial fibrillation: Rates controlled, continue Coreg  and Cardizem .  Holding Eliquis .  Will resume once cleared by general surgery postoperatively.  Essential hypertension: Controlled.  Continue Cardizem  and Coreg .  CKD stage IIIb/IV: GFR labile remaining in the range of stage IIIb and IV.  Currently at baseline.  Hyperlipidemia:  on statins but due to LFTs, will discontinue that for now.  Chronic thrombocytopenia: Stable, no signs of bleeding.  Follow CBC.  Type 2 diabetes mellitus: Takes Jardiance  at home.  Last hemoglobin A1c 6.5.  Continue SSI only.  DVT prophylaxis: Heparin    Code Status: Full Code  Family Communication:  None present at bedside.  Plan of care discussed with patient in length and he/she verbalized understanding and agreed with it.  Status is: Inpatient Remains inpatient appropriate because: Scheduled for cholecystectomy tomorrow morning   Estimated body mass index is 24.19 kg/m as calculated from the following:   Height as of this encounter: 5' 1 (1.549 m).   Weight as of this encounter: 58.1 kg.    Nutritional Assessment: Body mass index is 24.19 kg/m.SABRA Seen by dietician.  I agree with the assessment and plan as outlined below: Nutrition Status:        . Skin Assessment: I have examined the patient's skin and I agree with the wound assessment as performed by the wound care RN as outlined below:    Consultants:  GI and general surgery  Procedures:  As  above  Antimicrobials:  Anti-infectives (From admission, onward)    None         Subjective: Patient seen and examined, she has no complaints.  She is pleasant as  usual.  She is aware of the plan already.  Objective: Vitals:   09/09/23 1659 09/09/23 2031 09/10/23 0014 09/10/23 0418  BP: (!) 144/79 (!) 157/85 (!) 140/86 (!) 141/90  Pulse: 61 79 88 76  Resp:  17 16 14   Temp:  97.8 F (36.6 C) (!) 97.5 F (36.4 C) 97.8 F (36.6 C)  TempSrc:  Oral Oral Oral  SpO2: 99% 97% 96% 96%  Weight:      Height:        Intake/Output Summary (Last 24 hours) at 09/10/2023 0821 Last data filed at 09/10/2023 0600 Gross per 24 hour  Intake 1050.03 ml  Output 2 ml  Net 1048.03 ml   Filed Weights   09/07/23 0021 09/07/23 0443 09/09/23 1323  Weight: 58.1 kg 58.1 kg 58.1 kg    Examination:  General exam: Appears calm and comfortable  Respiratory system: Clear to auscultation. Respiratory effort normal. Cardiovascular system: S1 & S2 heard, RRR. No JVD, murmurs, rubs, gallops or clicks. No pedal edema. Gastrointestinal system: Abdomen is nondistended, soft and nontender. No organomegaly or masses felt. Normal bowel sounds heard. Central nervous system: Alert and oriented. No focal neurological deficits. Extremities: Symmetric 5 x 5 power. Skin: No rashes, lesions or ulcers.  Psychiatry: Judgement and insight appear normal. Mood & affect appropriate.   Data Reviewed: I have personally reviewed following labs and imaging studies  CBC: Recent Labs  Lab 09/07/23 0029 09/07/23 0734 09/08/23 0444 09/09/23 0332 09/10/23 0423  WBC 8.7 6.9 7.2 4.9 4.6  NEUTROABS 6.9 6.2  --   --   --   HGB 13.2 12.3 12.0 11.4* 12.6  HCT 40.9 39.2 37.7 36.4 40.4  MCV 88.7 89.5 90.8 90.5 90.2  PLT 133* 102* 92* 83* 86*   Basic Metabolic Panel: Recent Labs  Lab 09/07/23 0029 09/07/23 0734 09/08/23 0444 09/09/23 0332 09/10/23 0423  NA 138 136 135 135 138  K 3.5 3.3* 2.9* 3.7 4.0  CL 101 104 103 106 109  CO2 23 24 20* 19* 23  GLUCOSE 169* 137* 100* 130* 147*  BUN 34* 29* 24* 22 20  CREATININE 1.46* 1.18* 0.95 1.23* 1.36*  CALCIUM  9.4 8.8* 8.5* 8.8* 9.0    GFR: Estimated Creatinine Clearance: 24.3 mL/min (A) (by C-G formula based on SCr of 1.36 mg/dL (H)). Liver Function Tests: Recent Labs  Lab 09/07/23 0029 09/07/23 0734 09/08/23 0444 09/09/23 0332 09/10/23 0423  AST 146* 1,971* 751* 335* 160*  ALT 56* 831* 588* 366* 275*  ALKPHOS 167* 175* 162* 189* 190*  BILITOT 1.4* 2.6* 5.2* 5.6* 4.8*  PROT 7.9 7.3 7.1 6.7 6.7  ALBUMIN 4.2 3.3* 3.0* 2.8* 2.6*   Recent Labs  Lab 09/07/23 0029 09/09/23 0332  LIPASE >2,800* 35   No results for input(s): AMMONIA in the last 168 hours. Coagulation Profile: No results for input(s): INR, PROTIME in the last 168 hours. Cardiac Enzymes: No results for input(s): CKTOTAL, CKMB, CKMBINDEX, TROPONINI in the last 168 hours. BNP (last 3 results) No results for input(s): PROBNP in the last 8760 hours. HbA1C: No results for input(s): HGBA1C in the last 72 hours. CBG: Recent Labs  Lab 09/09/23 1101 09/09/23 2029 09/10/23 0012 09/10/23 0416 09/10/23 0754  GLUCAP 110* 187* 167* 138* 136*   Lipid Profile: No results for input(s): CHOL,  HDL, LDLCALC, TRIG, CHOLHDL, LDLDIRECT in the last 72 hours.  Thyroid  Function Tests: No results for input(s): TSH, T4TOTAL, FREET4, T3FREE, THYROIDAB in the last 72 hours. Anemia Panel: No results for input(s): VITAMINB12, FOLATE, FERRITIN, TIBC, IRON, RETICCTPCT in the last 72 hours. Sepsis Labs: Recent Labs  Lab 09/07/23 1430  LATICACIDVEN 1.4    No results found for this or any previous visit (from the past 240 hours).   Radiology Studies: No results found.   Scheduled Meds:  carvedilol   12.5 mg Oral BID   diltiazem   180 mg Oral q morning   insulin  aspart  0-6 Units Subcutaneous Q4H   pantoprazole   40 mg Oral Daily   Continuous Infusions:     LOS: 3 days   Fredia Skeeter, MD [facility] Total time spent: 40 minutes  09/10/2023, 8:21 AM   *Please note that this is a verbal  dictation therefore any spelling or grammatical errors are due to the Dragon Medical One system interpretation.  Please page via Amion and do not message via secure chat for urgent patient care matters. Secure chat can be used for non urgent patient care matters.  How to contact the [facility] Attending or Consulting provider 7A - 7P or covering provider during after hours 7P -7A, for this patient?  Check the care team in [facility] and look for a) attending/consulting [facility] provider listed and b) the [facility] team listed. Page or secure chat 7A-7P. Log into [email or web address] and use [place]'s universal password to access. If you do not have the password, please contact the hospital operator. Locate the [facility] provider you are looking for under [facility] and page to a number that you can be directly reached. If you still have difficulty reaching the provider, please page the [facility] (Director on Call) for the Hospitalists listed on amion for assistance.

## 2023-09-10 NOTE — Anesthesia Preprocedure Evaluation (Signed)
 Anesthesia Evaluation  Patient identified by MRN, date of birth, ID band Patient awake    Reviewed: Allergy & Precautions, H&P , NPO status , Patient's Chart, lab work & pertinent test results  Airway Mallampati: II  TM Distance: >3 FB Neck ROM: Full    Dental no notable dental hx. (+) Partial Upper   Pulmonary neg pulmonary ROS   Pulmonary exam normal breath sounds clear to auscultation       Cardiovascular hypertension, Pt. on medications + Peripheral Vascular Disease and +CHF  Normal cardiovascular exam+ dysrhythmias Atrial Fibrillation  Rhythm:Regular Rate:Normal     Neuro/Psych  PSYCHIATRIC DISORDERS  Depression    negative neurological ROS     GI/Hepatic Neg liver ROS,GERD  Medicated,,Acute gallstone pancreatitis   Endo/Other  diabetes, Type 2 Hyperthyroidism   Renal/GU Renal InsufficiencyRenal disease  negative genitourinary   Musculoskeletal  (+) Arthritis ,    Abdominal   Peds negative pediatric ROS (+)  Hematology negative hematology ROS (+)   Anesthesia Other Findings   Reproductive/Obstetrics negative OB ROS                              Anesthesia Physical Anesthesia Plan  ASA: 3 and emergent  Anesthesia Plan: General   Post-op Pain Management: Precedex   Induction: Intravenous  PONV Risk Score and Plan: 3 and Ondansetron , Dexamethasone  and Treatment may vary due to age or medical condition  Airway Management Planned: Oral ETT  Additional Equipment: None  Intra-op Plan:   Post-operative Plan: Extubation in OR  Informed Consent: I have reviewed the patients History and Physical, chart, labs and discussed the procedure including the risks, benefits and alternatives for the proposed anesthesia with the patient or authorized representative who has indicated his/her understanding and acceptance.     Dental advisory given  Plan Discussed with: CRNA  Anesthesia  Plan Comments:          Anesthesia Quick Evaluation

## 2023-09-10 NOTE — Progress Notes (Signed)
 PHARMACY - ANTICOAGULATION CONSULT NOTE  Pharmacy Consult for heparin   Indication:  hx atrial fibrillation  Allergies  Allergen Reactions   Amoxicillin-Pot Clavulanate Other (See Comments)    unknown   Amoxicillin Other (See Comments)    unknown   Clarithromycin Other (See Comments)    unknown    Patient Measurements: Height: 5' 1 (154.9 cm) Weight: 58.1 kg (128 lb) IBW/kg (Calculated) : 47.8 HEPARIN  DW (KG): 58.1  Vital Signs: Temp: 97.9 F (36.6 C) (08/02 1004) Temp Source: Oral (08/02 0418) BP: 158/72 (08/02 1006) Pulse Rate: 59 (08/02 1006)  Labs: Recent Labs    09/08/23 0444 09/08/23 1900 09/09/23 0332 09/10/23 0423  HGB 12.0  --  11.4* 12.6  HCT 37.7  --  36.4 40.4  PLT 92*  --  83* 86*  APTT >200* 86* 68*  --   HEPARINUNFRC 0.80*  --  0.14*  --   CREATININE 0.95  --  1.23* 1.36*    Estimated Creatinine Clearance: 24.3 mL/min (A) (by C-G formula based on SCr of 1.36 mg/dL (H)).   Medical History: Past Medical History:  Diagnosis Date   Arthritis    Depression    Diabetes mellitus without complication (HCC)    Borderline   Diastolic CHF (HCC) 03/03/2017   Echo 11/2016, nl EF but LVH and diastolic dysfunction   GERD (gastroesophageal reflux disease)    Hyperlipidemia    Hypertension    Peripheral arterial disease (HCC) 2018   thromboembolism of femoral artery   Thyroid  disease    subclinical hyperthyroidism managed by endocrinology    Medications:  - on Eliquis  2.5 mg bid PTA (last dose taken on 7/29 at 9p)  Assessment: Patient is an 89 y.o F with hx afib on heparin  drip PTA who presented to the ED on 09/07/23 with c/o abdominal pain and n/v.  She was subsequently found to have gallstone pancreatitis.  She underwent ERCP on 09/09/23 with plan to proceed with cholecystectomy on 09/11/23.  PTA Eliquis  held on admission and she was started on heparin  drip.  Heparin  held on 09/09/23 for ERCP. Pharmacy has been consulted on 09/10/23 to resume heparin  drip  back and to d/c it at midnight in anticipation for surgery on 09/11/23.   Today, 09/10/2023: - low at 86K but stable (hx thrombocytopenia) - scr 1.36 - LFTs elevated    Goal of Therapy:  Heparin  level 0.3-0.7 units/ml aPTT 66-102 seconds Monitor platelets by anticoagulation protocol: Yes   Plan:  - Resume heparin  drip back at 500 units/hr - check 8 hr heparin  level and aPTT - stop heparin  drip at midnight tonight  Finnbar Cedillos P 09/10/2023,10:47 AM

## 2023-09-10 NOTE — Progress Notes (Addendum)
 PHARMACY - ANTICOAGULATION CONSULT NOTE  Pharmacy Consult for heparin   Indication:  hx atrial fibrillation  Allergies  Allergen Reactions   Amoxicillin-Pot Clavulanate Other (See Comments)    unknown   Amoxicillin Other (See Comments)    unknown   Clarithromycin Other (See Comments)    unknown    Patient Measurements: Height: 5' 1 (154.9 cm) Weight: 58.1 kg (128 lb) IBW/kg (Calculated) : 47.8 HEPARIN  DW (KG): 58.1  Vital Signs: Temp: 98 F (36.7 C) (08/02 2014) Temp Source: Oral (08/02 2014) BP: 141/76 (08/02 2014) Pulse Rate: 51 (08/02 2014)  Labs: Recent Labs    09/08/23 0444 09/08/23 1900 09/09/23 0332 09/10/23 0423 09/10/23 1938  HGB 12.0  --  11.4* 12.6  --   HCT 37.7  --  36.4 40.4  --   PLT 92*  --  83* 86*  --   APTT >200* 86* 68*  --  32  HEPARINUNFRC 0.80*  --  0.14*  --  <0.10*  CREATININE 0.95  --  1.23* 1.36*  --     Estimated Creatinine Clearance: 24.3 mL/min (A) (by C-G formula based on SCr of 1.36 mg/dL (H)).   Medical History: Past Medical History:  Diagnosis Date   Arthritis    Depression    Diabetes mellitus without complication (HCC)    Borderline   Diastolic CHF (HCC) 03/03/2017   Echo 11/2016, nl EF but LVH and diastolic dysfunction   GERD (gastroesophageal reflux disease)    Hyperlipidemia    Hypertension    Peripheral arterial disease (HCC) 2018   thromboembolism of femoral artery   Thyroid  disease    subclinical hyperthyroidism managed by endocrinology    Medications:  - on Eliquis  2.5 mg bid PTA (last dose taken on 7/29 at 9p)  Assessment: Patient is an 89 y.o F with hx afib on heparin  drip PTA who presented to the ED on 09/07/23 with c/o abdominal pain and n/v.  She was subsequently found to have gallstone pancreatitis.  She underwent ERCP on 09/09/23 with plan to proceed with cholecystectomy on 09/11/23.  PTA Eliquis  held on admission and she was started on heparin  infusion.  Heparin  held on 09/09/23 for ERCP. Pharmacy has  been consulted on 09/10/23 to resume heparin  infusion back and to d/c it at midnight in anticipation for surgery on 09/11/23.   Today, 09/10/2023: - Platelets low at 86K but stable (hx thrombocytopenia) - Scr 1.36 - LFTs elevated   This evening, 19:38 aPTT = 32 and heparin  level = <0.10, sub-therapeutic with IV heparin  infusing at 500 units/hr Per nurse, no interruption in therapy or line issues and no bleeding Previously IV heparin  was therapeutic at 500 units/hr and supra-therapeutic at 650 units/hr IV heparin  scheduled to stop at midnight for surgery    Goal of Therapy:  Heparin  level 0.3-0.7 units/ml aPTT 66-102 seconds Monitor platelets by anticoagulation protocol: Yes   Plan:  - Continue IV heparin  at 500 units/hr - Stop heparin  infusion at midnight tonight - F/U if/when anticoagulation to be resumed after surgery   Thank you for allowing pharmacy to be a part of this patient's care.  Eleanor EMERSON Agent, PharmD, BCPS Clinical Pharmacist  09/10/2023 9:11 PM

## 2023-09-11 ENCOUNTER — Inpatient Hospital Stay (HOSPITAL_COMMUNITY): Payer: Self-pay | Admitting: Anesthesiology

## 2023-09-11 ENCOUNTER — Encounter (HOSPITAL_COMMUNITY)
Admission: EM | Disposition: A | Discharge status: Home Health | Payer: Self-pay | Source: Home / Self Care | Attending: Family Medicine

## 2023-09-11 DIAGNOSIS — I5032 Chronic diastolic (congestive) heart failure: Secondary | ICD-10-CM | POA: Diagnosis not present

## 2023-09-11 DIAGNOSIS — K8042 Calculus of bile duct with acute cholecystitis without obstruction: Secondary | ICD-10-CM

## 2023-09-11 DIAGNOSIS — N1832 Chronic kidney disease, stage 3b: Secondary | ICD-10-CM | POA: Diagnosis not present

## 2023-09-11 DIAGNOSIS — I130 Hypertensive heart and chronic kidney disease with heart failure and stage 1 through stage 4 chronic kidney disease, or unspecified chronic kidney disease: Secondary | ICD-10-CM | POA: Diagnosis not present

## 2023-09-11 DIAGNOSIS — K859 Acute pancreatitis without necrosis or infection, unspecified: Secondary | ICD-10-CM | POA: Diagnosis not present

## 2023-09-11 HISTORY — PX: CHOLECYSTECTOMY: SHX55

## 2023-09-11 LAB — COMPREHENSIVE METABOLIC PANEL WITH GFR
ALT: 212 U/L — ABNORMAL HIGH (ref 0–44)
AST: 101 U/L — ABNORMAL HIGH (ref 15–41)
Albumin: 2.5 g/dL — ABNORMAL LOW (ref 3.5–5.0)
Alkaline Phosphatase: 179 U/L — ABNORMAL HIGH (ref 38–126)
Anion gap: 8 (ref 5–15)
BUN: 23 mg/dL (ref 8–23)
CO2: 23 mmol/L (ref 22–32)
Calcium: 8.6 mg/dL — ABNORMAL LOW (ref 8.9–10.3)
Chloride: 105 mmol/L (ref 98–111)
Creatinine, Ser: 1.3 mg/dL — ABNORMAL HIGH (ref 0.44–1.00)
GFR, Estimated: 40 mL/min — ABNORMAL LOW (ref >60)
Glucose, Bld: 141 mg/dL — ABNORMAL HIGH (ref 70–99)
Potassium: 3 mmol/L — ABNORMAL LOW (ref 3.5–5.1)
Sodium: 136 mmol/L (ref 135–145)
Total Bilirubin: 2.2 mg/dL — ABNORMAL HIGH (ref 0.0–1.2)
Total Protein: 6.2 g/dL — ABNORMAL LOW (ref 6.5–8.1)

## 2023-09-11 LAB — GLUCOSE, CAPILLARY
Glucose-Capillary: 121 mg/dL — ABNORMAL HIGH (ref 70–99)
Glucose-Capillary: 148 mg/dL — ABNORMAL HIGH (ref 70–99)
Glucose-Capillary: 150 mg/dL — ABNORMAL HIGH (ref 70–99)
Glucose-Capillary: 153 mg/dL — ABNORMAL HIGH (ref 70–99)
Glucose-Capillary: 253 mg/dL — ABNORMAL HIGH (ref 70–99)
Glucose-Capillary: 300 mg/dL — ABNORMAL HIGH (ref 70–99)

## 2023-09-11 LAB — CBC
HCT: 40 % (ref 36.0–46.0)
Hemoglobin: 12.5 g/dL (ref 12.0–15.0)
MCH: 27.7 pg (ref 26.0–34.0)
MCHC: 31.3 g/dL (ref 30.0–36.0)
MCV: 88.7 fL (ref 80.0–100.0)
Platelets: 91 K/uL — ABNORMAL LOW (ref 150–400)
RBC: 4.51 MIL/uL (ref 3.87–5.11)
RDW: 14.1 % (ref 11.5–15.5)
WBC: 5.7 K/uL (ref 4.0–10.5)
nRBC: 0 % (ref 0.0–0.2)

## 2023-09-11 LAB — MAGNESIUM: Magnesium: 2.1 mg/dL (ref 1.7–2.4)

## 2023-09-11 SURGERY — LAPAROSCOPIC CHOLECYSTECTOMY WITH INTRAOPERATIVE CHOLANGIOGRAM
Anesthesia: General | Site: Abdomen

## 2023-09-11 MED ORDER — TRAMADOL HCL 50 MG PO TABS
50.0000 mg | ORAL_TABLET | Freq: Four times a day (QID) | ORAL | Status: DC | PRN
  Administered 2023-09-11: 50 mg via ORAL
  Filled 2023-09-11: qty 1

## 2023-09-11 MED ORDER — ACETAMINOPHEN 10 MG/ML IV SOLN
INTRAVENOUS | Status: AC
  Filled 2023-09-11: qty 100

## 2023-09-11 MED ORDER — OXYCODONE HCL 5 MG/5ML PO SOLN: 5.0000 mg | Freq: Once | ORAL | Status: DC | PRN

## 2023-09-11 MED ORDER — SUGAMMADEX SODIUM 200 MG/2ML IV SOLN
INTRAVENOUS | Status: AC
  Filled 2023-09-11: qty 2

## 2023-09-11 MED ORDER — POTASSIUM CHLORIDE 20 MEQ PO PACK
40.0000 meq | PACK | ORAL | Status: AC
  Administered 2023-09-11 (×2): 40 meq via ORAL
  Filled 2023-09-11 (×2): qty 2

## 2023-09-11 MED ORDER — PROPOFOL 10 MG/ML IV BOLUS
INTRAVENOUS | Status: AC
Start: 2023-09-11 — End: 2023-09-11
  Filled 2023-09-11: qty 20

## 2023-09-11 MED ORDER — DEXAMETHASONE SODIUM PHOSPHATE 10 MG/ML IJ SOLN
INTRAMUSCULAR | Status: AC
  Filled 2023-09-11: qty 1

## 2023-09-11 MED ORDER — LACTATED RINGERS IV SOLN: INTRAVENOUS | Status: DC | PRN

## 2023-09-11 MED ORDER — BUPIVACAINE-EPINEPHRINE 0.25% -1:200000 IJ SOLN
INTRAMUSCULAR | Status: DC | PRN
  Administered 2023-09-11: 15 mL

## 2023-09-11 MED ORDER — EPHEDRINE 5 MG/ML INJ
INTRAVENOUS | Status: AC
  Filled 2023-09-11: qty 5

## 2023-09-11 MED ORDER — BUPIVACAINE-EPINEPHRINE (PF) 0.25% -1:200000 IJ SOLN
INTRAMUSCULAR | Status: AC
  Filled 2023-09-11: qty 30

## 2023-09-11 MED ORDER — FENTANYL CITRATE (PF) 100 MCG/2ML IJ SOLN
INTRAMUSCULAR | Status: AC
Start: 2023-09-11 — End: 2023-09-11
  Filled 2023-09-11: qty 2

## 2023-09-11 MED ORDER — MIDAZOLAM HCL 2 MG/2ML IJ SOLN
INTRAMUSCULAR | Status: AC
  Filled 2023-09-11: qty 2

## 2023-09-11 MED ORDER — OXYCODONE HCL 5 MG PO TABS: 5.0000 mg | ORAL_TABLET | Freq: Once | ORAL | Status: DC | PRN

## 2023-09-11 MED ORDER — CEFAZOLIN SODIUM-DEXTROSE 2-4 GM/100ML-% IV SOLN
INTRAVENOUS | Status: AC
  Filled 2023-09-11: qty 100

## 2023-09-11 MED ORDER — LIDOCAINE HCL (PF) 2 % IJ SOLN
INTRAMUSCULAR | Status: AC
  Filled 2023-09-11: qty 5

## 2023-09-11 MED ORDER — MIDAZOLAM HCL 2 MG/2ML IJ SOLN
1.0000 mg | Freq: Once | INTRAMUSCULAR | Status: AC | PRN
  Administered 2023-09-11: 1 mg via INTRAVENOUS

## 2023-09-11 MED ORDER — ONDANSETRON HCL 4 MG/2ML IJ SOLN: 4.0000 mg | Freq: Once | INTRAMUSCULAR | Status: DC | PRN

## 2023-09-11 MED ORDER — ACETAMINOPHEN 325 MG PO TABS
650.0000 mg | ORAL_TABLET | Freq: Four times a day (QID) | ORAL | Status: DC
  Administered 2023-09-11 – 2023-09-12 (×4): 650 mg via ORAL
  Filled 2023-09-11 (×4): qty 2

## 2023-09-11 MED ORDER — POTASSIUM CHLORIDE CRYS ER 20 MEQ PO TBCR
40.0000 meq | EXTENDED_RELEASE_TABLET | ORAL | Status: DC
  Filled 2023-09-11: qty 2

## 2023-09-11 MED ORDER — DEXAMETHASONE SODIUM PHOSPHATE 10 MG/ML IJ SOLN
INTRAMUSCULAR | Status: DC | PRN
  Administered 2023-09-11: 10 mg via INTRAVENOUS

## 2023-09-11 MED ORDER — FENTANYL CITRATE (PF) 100 MCG/2ML IJ SOLN
INTRAMUSCULAR | Status: DC | PRN
  Administered 2023-09-11 (×2): 50 ug via INTRAVENOUS

## 2023-09-11 MED ORDER — OXYCODONE HCL 5 MG PO TABS
5.0000 mg | ORAL_TABLET | Freq: Four times a day (QID) | ORAL | Status: DC | PRN
  Filled 2023-09-11: qty 1

## 2023-09-11 MED ORDER — MORPHINE SULFATE (PF) 2 MG/ML IV SOLN: 1.0000 mg | INTRAVENOUS | Status: DC | PRN

## 2023-09-11 MED ORDER — SUGAMMADEX SODIUM 200 MG/2ML IV SOLN
INTRAVENOUS | Status: DC | PRN
  Administered 2023-09-11: 200 mg via INTRAVENOUS

## 2023-09-11 MED ORDER — ONDANSETRON HCL 4 MG/2ML IJ SOLN
INTRAMUSCULAR | Status: DC | PRN
  Administered 2023-09-11: 4 mg via INTRAVENOUS

## 2023-09-11 MED ORDER — FENTANYL CITRATE PF 50 MCG/ML IJ SOSY
PREFILLED_SYRINGE | INTRAMUSCULAR | Status: AC
  Filled 2023-09-11: qty 2

## 2023-09-11 MED ORDER — PHENYLEPHRINE 80 MCG/ML (10ML) SYRINGE FOR IV PUSH (FOR BLOOD PRESSURE SUPPORT)
PREFILLED_SYRINGE | INTRAVENOUS | Status: AC
  Filled 2023-09-11: qty 10

## 2023-09-11 MED ORDER — LACTATED RINGERS IR SOLN
Status: DC | PRN
  Administered 2023-09-11: 1000 mL

## 2023-09-11 MED ORDER — FENTANYL CITRATE PF 50 MCG/ML IJ SOSY
25.0000 ug | PREFILLED_SYRINGE | INTRAMUSCULAR | Status: DC | PRN
  Administered 2023-09-11 (×2): 50 ug via INTRAVENOUS

## 2023-09-11 MED ORDER — ROCURONIUM BROMIDE 10 MG/ML (PF) SYRINGE
PREFILLED_SYRINGE | INTRAVENOUS | Status: DC | PRN
  Administered 2023-09-11: 40 mg via INTRAVENOUS

## 2023-09-11 MED ORDER — PROPOFOL 10 MG/ML IV BOLUS
INTRAVENOUS | Status: DC | PRN
  Administered 2023-09-11: 120 mg via INTRAVENOUS

## 2023-09-11 MED ORDER — ROCURONIUM BROMIDE 10 MG/ML (PF) SYRINGE
PREFILLED_SYRINGE | INTRAVENOUS | Status: AC
  Filled 2023-09-11: qty 10

## 2023-09-11 MED ORDER — MEPERIDINE HCL 25 MG/ML IJ SOLN: 6.2500 mg | INTRAMUSCULAR | Status: DC | PRN

## 2023-09-11 MED ORDER — INDOCYANINE GREEN 25 MG IV SOLR
1.2500 mg | Freq: Once | INTRAVENOUS | Status: DC
  Filled 2023-09-11: qty 10

## 2023-09-11 MED ORDER — ONDANSETRON HCL 4 MG/2ML IJ SOLN
INTRAMUSCULAR | Status: AC
  Filled 2023-09-11: qty 2

## 2023-09-11 MED ORDER — LIDOCAINE HCL (PF) 2 % IJ SOLN
INTRAMUSCULAR | Status: DC | PRN
  Administered 2023-09-11: 60 mg via INTRADERMAL

## 2023-09-11 MED ORDER — ACETAMINOPHEN 10 MG/ML IV SOLN
INTRAVENOUS | Status: DC | PRN
  Administered 2023-09-11: 1000 mg via INTRAVENOUS

## 2023-09-11 MED ORDER — SPY AGENT GREEN - (INDOCYANINE FOR INJECTION)
INTRAMUSCULAR | Status: DC | PRN
Start: 2023-09-11 — End: 2023-09-11
  Administered 2023-09-11: 1.25 mL via INTRAVENOUS

## 2023-09-11 SURGICAL SUPPLY — 37 items
BAG COUNTER SPONGE SURGICOUNT (BAG) IMPLANT
BENZOIN TINCTURE PRP APPL 2/3 (GAUZE/BANDAGES/DRESSINGS) IMPLANT
CABLE HIGH FREQUENCY MONO STRZ (ELECTRODE) ×1 IMPLANT
CHLORAPREP W/TINT 26 (MISCELLANEOUS) ×1 IMPLANT
CLIP APPLIE 5 13 M/L LIGAMAX5 (MISCELLANEOUS) ×1 IMPLANT
CLIP APPLIE ROT 10 11.4 M/L (STAPLE) IMPLANT
CLSR STERI-STRIP ANTIMIC 1/2X4 (GAUZE/BANDAGES/DRESSINGS) IMPLANT
COVER MAYO STAND XLG (MISCELLANEOUS) ×1 IMPLANT
COVER SURGICAL LIGHT HANDLE (MISCELLANEOUS) ×1 IMPLANT
DERMABOND ADVANCED .7 DNX12 (GAUZE/BANDAGES/DRESSINGS) IMPLANT
DRAPE C-ARM 42X120 X-RAY (DRAPES) IMPLANT
ELECT REM PT RETURN 15FT ADLT (MISCELLANEOUS) ×1 IMPLANT
GLOVE BIO SURGEON STRL SZ7 (GLOVE) ×1 IMPLANT
GLOVE BIOGEL PI IND STRL 7.5 (GLOVE) ×1 IMPLANT
GOWN STRL REUS W/ TWL LRG LVL3 (GOWN DISPOSABLE) ×1 IMPLANT
GRASPER SUT TROCAR 14GX15 (MISCELLANEOUS) IMPLANT
HEMOSTAT SNOW SURGICEL 2X4 (HEMOSTASIS) IMPLANT
IRRIGATION SUCT STRKRFLW 2 WTP (MISCELLANEOUS) ×1 IMPLANT
KIT BASIN OR (CUSTOM PROCEDURE TRAY) ×1 IMPLANT
KIT TURNOVER KIT A (KITS) ×1 IMPLANT
PENCIL SMOKE EVACUATOR (MISCELLANEOUS) IMPLANT
POUCH RETRIEVAL ECOSAC 10 (ENDOMECHANICALS) ×1 IMPLANT
PROTECTOR NERVE ULNAR (MISCELLANEOUS) IMPLANT
SCISSORS LAP 5X35 DISP (ENDOMECHANICALS) ×1 IMPLANT
SET CHOLANGIOGRAPH MIX (MISCELLANEOUS) IMPLANT
SET TUBE SMOKE EVAC HIGH FLOW (TUBING) ×1 IMPLANT
SLEEVE Z-THREAD 5X100MM (TROCAR) ×2 IMPLANT
SPIKE FLUID TRANSFER (MISCELLANEOUS) ×1 IMPLANT
STRIP CLOSURE SKIN 1/2X4 (GAUZE/BANDAGES/DRESSINGS) IMPLANT
SUT MNCRL AB 4-0 PS2 18 (SUTURE) ×1 IMPLANT
SUT VICRYL 0 TIES 12 18 (SUTURE) IMPLANT
SUT VICRYL 0 UR6 27IN ABS (SUTURE) IMPLANT
TOWEL OR 17X26 10 PK STRL BLUE (TOWEL DISPOSABLE) ×1 IMPLANT
TRAY LAPAROSCOPIC (CUSTOM PROCEDURE TRAY) ×1 IMPLANT
TROCAR 11X100 Z THREAD (TROCAR) IMPLANT
TROCAR BALLN 12MMX100 BLUNT (TROCAR) ×1 IMPLANT
TROCAR Z-THREAD OPTICAL 5X100M (TROCAR) ×1 IMPLANT

## 2023-09-11 NOTE — Progress Notes (Signed)
 PROGRESS NOTE    Nicole Jimenez  FMW:[id number] DOB: 05-13-1934 DOA: 09/07/2023 PCP: Nicole Lavern LITTIE, MD   Brief Narrative:  HPI: Nicole Jimenez is a 89 y.o. female with history of A-fib, hypertension, hyperlipidemia, chronic kidney disease stage III, diabetes mellitus type 2, thrombocytopenia was brought to the ER after patient started complaining of severe pain with abdominal pain radiating to her back and across the abdomen with nausea vomiting since last night.  Had a similar episode about a month ago which was self-limited.  Denies any diarrhea fever or chills.  Denies drinking alcohol.   ED Course: In the ER patient's labs show elevated lipase with mildly elevated AST ALT and total bilirubin of 1.4.  CT abdomen pelvis shows cholelithiasis and ultrasound abdomen shows which is concerning for choledocholithiasis.  Gallbladder thickening was present but no Murphy sign.  ER physician discussed the [facility] gastroenterologist.  Placed patient on IV fluids and pain relief medications admitted for acute pancreatitis likely secondary to gallstones.  Assessment & Plan:   Principal Problem:   Acute pancreatitis Active Problems:   Essential hypertension   Type 2 diabetes mellitus with peripheral neuropathy (HCC)   Permanent atrial fibrillation (HCC)   Thrombocytopenia (HCC)   Chronic kidney disease, stage 3b (HCC)   Gastritis without bleeding   Obstructive jaundice   Abnormal LFTs   Gallstone pancreatitis  Acute gallstone pancreatitis/acute choledocholithiasis/dilated CBD/elevated LFTs/possible acute cholecystitis, POA: Seen by GI as well as general surgery.  Due to rising LFTs and bilirubin, MRCP completed which raised concern for choledocholithiasis, patient ended up having ERCP by Dr. Wilhelmenia on 09/09/2023, biliary sludge removed with sphincteroplasty and sphincterotomy performed. Evidence of cholelithiasis on cholangiogram with filling of the gallbladder.  Bilirubin and LFTs continue to  improve.  Status post laparoscopic cholecystectomy by Dr. Ebbie 09/11/2023.   Paroxysmal atrial fibrillation: Rates controlled, continue Coreg  and Cardizem .  Holding Eliquis .  Will resume once cleared by general surgery postoperatively.  Essential hypertension: Controlled.  Continue Cardizem  and Coreg .  Hypokalemia: Will replenish.  Check magnesium .  CKD stage IIIb/IV: GFR labile remaining in the range of stage IIIb and IV.  Currently at baseline.  Hyperlipidemia:  on statins but due to LFTs, will discontinue that for now.  Chronic thrombocytopenia: Stable, no signs of bleeding.  Follow CBC.  Type 2 diabetes mellitus: Takes Jardiance  at home.  Last hemoglobin A1c 6.5.  Continue SSI only.  DVT prophylaxis: Heparin    Code Status: Full Code  Family Communication: Daughter and son-in-law present at bedside.  Plan of care discussed with patient in length and he/she verbalized understanding and agreed with it.  Status is: Inpatient Remains inpatient appropriate because: Just had cholecystectomy today.  Potential discharge tomorrow.   Estimated body mass index is 24.19 kg/m as calculated from the following:   Height as of this encounter: 5' 1 (1.549 m).   Weight as of this encounter: 58.1 kg.    Nutritional Assessment: Body mass index is 24.19 kg/m.SABRA Seen by dietician.  I agree with the assessment and plan as outlined below: Nutrition Status:        . Skin Assessment: I have examined the patient's skin and I agree with the wound assessment as performed by the wound care RN as outlined below:    Consultants:  GI and general surgery  Procedures:  As above  Antimicrobials:  Anti-infectives (From admission, onward)    Start     Dose/Rate Route Frequency Ordered Stop   09/11/23 0600  ceFAZolin  (ANCEF )  IVPB 2g/100 mL premix        2 g 200 mL/hr over 30 Minutes Intravenous On call to O.R. 09/10/23 1327 09/11/23 0813         Subjective: Patient seen and examined  post operatively.  Complains of sore abdomen and slightly groggy as well.  Daughter and son-in-law present at bedside.  Answered several questions.  Objective: Vitals:   09/11/23 0845 09/11/23 0900 09/11/23 0915 09/11/23 0931  BP: (!) 150/71 (!) 165/85 (!) 154/65 (!) 156/81  Pulse: (!) 44 (!) 55 72 (!) 42  Resp: 14 13 10 12   Temp:  (!) 97 F (36.1 C)    TempSrc:      SpO2: 95% 95% 100% 100%  Weight:      Height:        Intake/Output Summary (Last 24 hours) at 09/11/2023 0957 Last data filed at 09/11/2023 9178 Gross per 24 hour  Intake 492.79 ml  Output 255 ml  Net 237.79 ml   Filed Weights   09/07/23 0021 09/07/23 0443 09/09/23 1323  Weight: 58.1 kg 58.1 kg 58.1 kg    Examination:  General exam: Appears calm and comfortable  Respiratory system: Clear to auscultation. Respiratory effort normal. Cardiovascular system: S1 & S2 heard, RRR. No JVD, murmurs, rubs, gallops or clicks. No pedal edema. Gastrointestinal system: Abdomen is nondistended, soft and mild to moderate generalized abdominal tenderness. No organomegaly or masses felt. Normal bowel sounds heard. Central nervous system: Groggy but oriented. No focal neurological deficits. Extremities: Symmetric 5 x 5 power. Skin: No rashes, lesions or ulcers.  Psychiatry: Judgement and insight appear normal. Mood & affect appropriate.   Data Reviewed: I have personally reviewed following labs and imaging studies  CBC: Recent Labs  Lab 09/07/23 0029 09/07/23 0734 09/08/23 0444 09/09/23 0332 09/10/23 0423 09/11/23 0307  WBC 8.7 6.9 7.2 4.9 4.6 5.7  NEUTROABS 6.9 6.2  --   --   --   --   HGB 13.2 12.3 12.0 11.4* 12.6 12.5  HCT 40.9 39.2 37.7 36.4 40.4 40.0  MCV 88.7 89.5 90.8 90.5 90.2 88.7  PLT 133* 102* 92* 83* 86* 91*   Basic Metabolic Panel: Recent Labs  Lab 09/07/23 0734 09/08/23 0444 09/09/23 0332 09/10/23 0423 09/11/23 0307  NA 136 135 135 138 136  K 3.3* 2.9* 3.7 4.0 3.0*  CL 104 103 106 109 105  CO2 24  20* 19* 23 23  GLUCOSE 137* 100* 130* 147* 141*  BUN 29* 24* 22 20 23   CREATININE 1.18* 0.95 1.23* 1.36* 1.30*  CALCIUM  8.8* 8.5* 8.8* 9.0 8.6*   GFR: Estimated Creatinine Clearance: 25.5 mL/min (A) (by C-G formula based on SCr of 1.3 mg/dL (H)). Liver Function Tests: Recent Labs  Lab 09/07/23 0734 09/08/23 0444 09/09/23 0332 09/10/23 0423 09/11/23 0307  AST 1,971* 751* 335* 160* 101*  ALT 831* 588* 366* 275* 212*  ALKPHOS 175* 162* 189* 190* 179*  BILITOT 2.6* 5.2* 5.6* 4.8* 2.2*  PROT 7.3 7.1 6.7 6.7 6.2*  ALBUMIN 3.3* 3.0* 2.8* 2.6* 2.5*   Recent Labs  Lab 09/07/23 0029 09/09/23 0332  LIPASE >2,800* 35   No results for input(s): AMMONIA in the last 168 hours. Coagulation Profile: No results for input(s): INR, PROTIME in the last 168 hours. Cardiac Enzymes: No results for input(s): CKTOTAL, CKMB, CKMBINDEX, TROPONINI in the last 168 hours. BNP (last 3 results) No results for input(s): PROBNP in the last 8760 hours. HbA1C: No results for input(s): HGBA1C in the last 72  hours. CBG: Recent Labs  Lab 09/10/23 1713 09/10/23 2012 09/11/23 0016 09/11/23 0350 09/11/23 0836  GLUCAP 148* 162* 150* 121* 153*   Lipid Profile: No results for input(s): CHOL, HDL, LDLCALC, TRIG, CHOLHDL, LDLDIRECT in the last 72 hours.  Thyroid  Function Tests: No results for input(s): TSH, T4TOTAL, FREET4, T3FREE, THYROIDAB in the last 72 hours. Anemia Panel: No results for input(s): VITAMINB12, FOLATE, FERRITIN, TIBC, IRON, RETICCTPCT in the last 72 hours. Sepsis Labs: Recent Labs  Lab 09/07/23 1430  LATICACIDVEN 1.4    No results found for this or any previous visit (from the past 240 hours).   Radiology Studies: DG ERCP Result Date: 09/10/2023 CLINICAL DATA:  Abnormal MRCP in a patient with jaundice and abnormal liver function studies. EXAM: ERCP 8 fluoroscopic views in once in a loop TECHNIQUE: Multiple spot images obtained  with the fluoroscopic device and submitted for interpretation post-procedure. FLUOROSCOPY: Radiation Exposure Index (as provided by the fluoroscopic device): 12.34 mGy Kerma COMPARISON:  None Available. FINDINGS: Eight fluoroscopic images of the right upper quadrant intra procedural ERCP submitted for interpretation with 1 cine loop. Images demonstrate flexible endoscopy device with guidewire in the common bile duct. Mild dilatation of the bile duct with numerous filling defects consistent with choledocholithiasis. Cine loop demonstrates balloon sweeping of the common bile duct. The cystic duct is patent and contrast can be seen flowing into the gallbladder which demonstrates cholelithiasis. No contrast extravasation. IMPRESSION: Choledocholithiasis and cholelithiasis. ERCP with balloon sweep of the common bile duct. These images were submitted for radiologic interpretation only. Please see the procedural report for the amount of contrast and the fluoroscopy time utilized. Electronically Signed   By: Cordella Banner   On: 09/10/2023 15:55     Scheduled Meds:  carvedilol   12.5 mg Oral BID   diltiazem   180 mg Oral q morning   insulin  aspart  0-6 Units Subcutaneous Q4H   pantoprazole   40 mg Oral Daily   potassium chloride   40 mEq Oral Q4H   Continuous Infusions:     LOS: 4 days   Fredia Skeeter, MD [facility] Total time spent: 40 minutes  09/11/2023, 9:57 AM   *Please note that this is a verbal dictation therefore any spelling or grammatical errors are due to the Dragon Medical One system interpretation.  Please page via Amion and do not message via secure chat for urgent patient care matters. Secure chat can be used for non urgent patient care matters.  How to contact the [facility] Attending or Consulting provider 7A - 7P or covering provider during after hours 7P -7A, for this patient?  Check the care team in [facility] and look for a) attending/consulting [facility] provider listed and b) the [facility] team  listed. Page or secure chat 7A-7P. Log into [email or web address] and use [place]'s universal password to access. If you do not have the password, please contact the hospital operator. Locate the [facility] provider you are looking for under [facility] and page to a number that you can be directly reached. If you still have difficulty reaching the provider, please page the [facility] (Director on Call) for the Hospitalists listed on amion for assistance.

## 2023-09-11 NOTE — Op Note (Signed)
 Preoperative diagnosis: choledocholithiasis Postoperative diagnosis saa, acute cholecystitis Procedure: Laparoscopic cholecystectomy Surgeon: Dr. Adina Bury Anesthesia: General  Complications: None Drains: None Estimated blood loss: <50 cc Specimens: Gallbladder and contents to pathology Sponge count was correct at completion Disposition recovery stable   Indications:Nicole Jimenez admitted for symptomatic cholelithiasis possible cholecystitis. Underwent ercp for choledocholithiasis. We now discussed proceeding with lap chole.    Procedure: After informed consent was obtained she was taken to the operating room.  She was given antibiotics.  SCDs were placed.  She was placed under anesthesia without complication.  She was prepped and draped in a standard sterile surgical fashion.  A surgical timeout was then performed.   I infiltrated Marcaine  below her umbilicus.  I made a vertical incision. I incised the fascia and entered the peritoneum bluntly without injury. I placed a 0 vicryl pursestring suture and inserted a hasson trocar.   The abdomen was insufflated to 15 mmHg pressure.  I then placed 3 additional 5 mm trocars in the epigastrium and right side of the abdomen under direct vision.  Gallbladder was noted to have acute cholecystitis.  She had some adhesions to her omentum and duodenum that I took down bluntly as well as with cautery.   The gallbladder was then retracted cephalad and lateral.   I was then able to dissect the triangle and clearly obtain a critical view of safety.  I took the gallbladder off the liver halfway up.  I used  green dye to confirm that the cystic duct and common duct were where I thought they were.  I was able to identify these with the dye.  I clipped the artery three times and divided it leaving two clips in place. I then clipped the cystic duct and divided it leaving three clips in place. The clips completely traversed the duct and the duct was viable.  The gallbladder  was placed in a retrieval bag.  I obtained hemostasis. The liver was very friable.  I also placed surgicel snow in the bed. I then removed the gallbladder in the retrieval bag.  The umbilical  trocar was removed. I tied my pursestring down.  I placed another 0 Vicryl suture using the suture passer to completely obliterate the umbilical defect.  I then removed the remaining trocars and desufflated the abdomen.  These were closed with 4-0 Monocryl and glue.  She tolerated this well was extubated and transferred recovery stable.

## 2023-09-11 NOTE — Anesthesia Postprocedure Evaluation (Signed)
 Anesthesia Post Note  Patient: Nicole Jimenez  Procedure(s) Performed: LAPAROSCOPIC CHOLECYSTECTOMY WITH INTRAOPERATIVE CHOLANGIOGRAM (Abdomen)     Patient location during evaluation: PACU Anesthesia Type: General Level of consciousness: awake and alert Pain management: pain level controlled Vital Signs Assessment: post-procedure vital signs reviewed and stable Respiratory status: spontaneous breathing, nonlabored ventilation, respiratory function stable and patient connected to nasal cannula oxygen Cardiovascular status: blood pressure returned to baseline and stable Postop Assessment: no apparent nausea or vomiting Anesthetic complications: no   No notable events documented.  Last Vitals:  Vitals:   09/11/23 0931 09/11/23 1000  BP: (!) 156/81 (!) 173/82  Pulse: (!) 42 (!) 53  Resp: 12 14  Temp:    SpO2: 100% 100%    Last Pain:  Vitals:   09/11/23 1000  TempSrc:   PainSc: 6                  Nakita Santerre

## 2023-09-11 NOTE — Transfer of Care (Signed)
 Immediate Anesthesia Transfer of Care Note  Patient: Nicole Jimenez  Procedure(s) Performed: Procedure(s): LAPAROSCOPIC CHOLECYSTECTOMY WITH INTRAOPERATIVE CHOLANGIOGRAM (N/A)  Patient Location: PACU  Anesthesia Type:General  Level of Consciousness: Alert, Awake, Oriented  Airway & Oxygen Therapy: Patient Spontanous Breathing  Post-op Assessment: Report given to RN  Post vital signs: Reviewed and stable  Last Vitals:  Vitals:   09/11/23 0700 09/11/23 0833  BP:  (!) 150/80  Pulse:  (!) 40  Resp: 17 17  Temp: 36.5 C (!) 36.2 C  SpO2: 100% 97%    Complications: No apparent anesthesia complications

## 2023-09-11 NOTE — Progress Notes (Signed)
*   Day of Surgery *   Subjective/Chief Complaint: Doing fine   Objective: Vital signs in last 24 hours: Temp:  [97.7 F (36.5 C)-98 F (36.7 C)] 97.7 F (36.5 C) (08/03 0700) Pulse Rate:  [51-74] 60 (08/02 2121) Resp:  [16-17] 17 (08/03 0700) BP: (141-169)/(72-102) 141/76 (08/02 2014) SpO2:  [97 %-100 %] 100 % (08/03 0700) Last BM Date : 09/08/23  Intake/Output from previous day: 08/02 0701 - 08/03 0700 In: 392.8 [P.O.:340; I.V.:52.8] Out: 250 [Urine:250] Intake/Output this shift: No intake/output data recorded.  Ab soft nontender nondistended  Lab Results:  Recent Labs    09/10/23 0423 09/11/23 0307  WBC 4.6 5.7  HGB 12.6 12.5  HCT 40.4 40.0  PLT 86* 91*   BMET Recent Labs    09/10/23 0423 09/11/23 0307  NA 138 136  K 4.0 3.0*  CL 109 105  CO2 23 23  GLUCOSE 147* 141*  BUN 20 23  CREATININE 1.36* 1.30*  CALCIUM  9.0 8.6*   PT/INR No results for input(s): LABPROT, INR in the last 72 hours. ABG No results for input(s): PHART, HCO3 in the last 72 hours.  Invalid input(s): PCO2, PO2  Studies/Results: DG ERCP Result Date: 09/10/2023 CLINICAL DATA:  Abnormal MRCP in a patient with jaundice and abnormal liver function studies. EXAM: ERCP 8 fluoroscopic views in once in a loop TECHNIQUE: Multiple spot images obtained with the fluoroscopic device and submitted for interpretation post-procedure. FLUOROSCOPY: Radiation Exposure Index (as provided by the fluoroscopic device): 12.34 mGy Kerma COMPARISON:  None Available. FINDINGS: Eight fluoroscopic images of the right upper quadrant intra procedural ERCP submitted for interpretation with 1 cine loop. Images demonstrate flexible endoscopy device with guidewire in the common bile duct. Mild dilatation of the bile duct with numerous filling defects consistent with choledocholithiasis. Cine loop demonstrates balloon sweeping of the common bile duct. The cystic duct is patent and contrast can be seen flowing into  the gallbladder which demonstrates cholelithiasis. No contrast extravasation. IMPRESSION: Choledocholithiasis and cholelithiasis. ERCP with balloon sweep of the common bile duct. These images were submitted for radiologic interpretation only. Please see the procedural report for the amount of contrast and the fluoroscopy time utilized. Electronically Signed   By: Cordella Banner   On: 09/10/2023 15:55    Anti-infectives: Anti-infectives (From admission, onward)    Start     Dose/Rate Route Frequency Ordered Stop   09/11/23 0600  ceFAZolin  (ANCEF ) IVPB 2g/100 mL premix        2 g 200 mL/hr over 30 Minutes Intravenous On call to O.R. 09/10/23 1327 09/12/23 0559       Assessment/Plan: Biliary pancreatitis -resolved, s/p ercp, lfts improving -plan for lap chole today as discussed again this am   Nicole Jimenez 09/11/2023

## 2023-09-11 NOTE — Anesthesia Procedure Notes (Signed)
 Procedure Name: Intubation Date/Time: 09/11/2023 7:48 AM  Performed by: Mitchell Celestine BRAVO, CRNAPre-anesthesia Checklist: Patient identified, Patient being monitored, Timeout performed, Emergency Drugs available and Suction available Patient Re-evaluated:Patient Re-evaluated prior to induction Oxygen Delivery Method: Circle system utilized Preoxygenation: Pre-oxygenation with 100% oxygen Induction Type: IV induction Ventilation: Mask ventilation without difficulty Laryngoscope Size: Mac and 3 Grade View: Grade I Tube type: Oral Tube size: 7.0 mm Number of attempts: 1 Placement Confirmation: ETT inserted through vocal cords under direct vision, positive ETCO2 and breath sounds checked- equal and bilateral Secured at: 21 cm Tube secured with: Tape Dental Injury: Teeth and Oropharynx as per pre-operative assessment  Comments: Would recommend glide scope. Blind intubation w DL per CRNA Mitchell x 1 successfully.

## 2023-09-11 NOTE — Discharge Instructions (Signed)

## 2023-09-12 ENCOUNTER — Encounter (HOSPITAL_COMMUNITY): Payer: Self-pay | Admitting: General Surgery

## 2023-09-12 ENCOUNTER — Other Ambulatory Visit: Payer: Self-pay

## 2023-09-12 ENCOUNTER — Other Ambulatory Visit (HOSPITAL_COMMUNITY): Payer: Self-pay

## 2023-09-12 DIAGNOSIS — K859 Acute pancreatitis without necrosis or infection, unspecified: Secondary | ICD-10-CM | POA: Diagnosis not present

## 2023-09-12 LAB — COMPREHENSIVE METABOLIC PANEL WITH GFR
ALT: 148 U/L — ABNORMAL HIGH (ref 0–44)
AST: 67 U/L — ABNORMAL HIGH (ref 15–41)
Albumin: 2.6 g/dL — ABNORMAL LOW (ref 3.5–5.0)
Alkaline Phosphatase: 166 U/L — ABNORMAL HIGH (ref 38–126)
Anion gap: 9 (ref 5–15)
BUN: 26 mg/dL — ABNORMAL HIGH (ref 8–23)
CO2: 24 mmol/L (ref 22–32)
Calcium: 8.8 mg/dL — ABNORMAL LOW (ref 8.9–10.3)
Chloride: 103 mmol/L (ref 98–111)
Creatinine, Ser: 1.31 mg/dL — ABNORMAL HIGH (ref 0.44–1.00)
GFR, Estimated: 40 mL/min — ABNORMAL LOW (ref >60)
Glucose, Bld: 239 mg/dL — ABNORMAL HIGH (ref 70–99)
Potassium: 4.1 mmol/L (ref 3.5–5.1)
Sodium: 136 mmol/L (ref 135–145)
Total Bilirubin: 1.6 mg/dL — ABNORMAL HIGH (ref 0.0–1.2)
Total Protein: 6.1 g/dL — ABNORMAL LOW (ref 6.5–8.1)

## 2023-09-12 LAB — CBC
HCT: 40.3 % (ref 36.0–46.0)
Hemoglobin: 12.9 g/dL (ref 12.0–15.0)
MCH: 28.9 pg (ref 26.0–34.0)
MCHC: 32 g/dL (ref 30.0–36.0)
MCV: 90.2 fL (ref 80.0–100.0)
Platelets: 95 K/uL — ABNORMAL LOW (ref 150–400)
RBC: 4.47 MIL/uL (ref 3.87–5.11)
RDW: 14.2 % (ref 11.5–15.5)
WBC: 9.2 K/uL (ref 4.0–10.5)
nRBC: 0 % (ref 0.0–0.2)

## 2023-09-12 LAB — GLUCOSE, CAPILLARY
Glucose-Capillary: 143 mg/dL — ABNORMAL HIGH (ref 70–99)
Glucose-Capillary: 218 mg/dL — ABNORMAL HIGH (ref 70–99)
Glucose-Capillary: 219 mg/dL — ABNORMAL HIGH (ref 70–99)
Glucose-Capillary: 333 mg/dL — ABNORMAL HIGH (ref 70–99)

## 2023-09-12 LAB — HEPATITIS B CORE ANTIBODY, TOTAL: HEP B CORE AB: NEGATIVE

## 2023-09-12 MED ORDER — OXYCODONE HCL 5 MG PO TABS
5.0000 mg | ORAL_TABLET | Freq: Four times a day (QID) | ORAL | 0 refills | Status: DC | PRN
  Filled 2023-09-12: qty 15, 4d supply, fill #0

## 2023-09-12 MED ORDER — PANTOPRAZOLE SODIUM 40 MG PO TBEC
40.0000 mg | DELAYED_RELEASE_TABLET | Freq: Every day | ORAL | 0 refills | Status: DC
  Filled 2023-09-12: qty 30, 30d supply, fill #0

## 2023-09-12 MED ORDER — ROSUVASTATIN CALCIUM 40 MG PO TABS: 40.0000 mg | ORAL_TABLET | Freq: Every day | ORAL | Status: DC

## 2023-09-12 MED ORDER — ACETAMINOPHEN 500 MG PO TABS: 1000.0000 mg | ORAL_TABLET | Freq: Four times a day (QID) | ORAL | Status: AC | PRN

## 2023-09-12 NOTE — Plan of Care (Signed)
  Problem: Nutritional: Goal: Ability to achieve adequate nutritional intake will improve Outcome: Progressing   

## 2023-09-12 NOTE — Care Management Important Message (Signed)
 Important Message  Patient Details  Name: Nicole Jimenez MRN: [id number] Date of Birth: 1934/11/05   Important Message Given:        [name] 09/12/2023, 12:04 PM

## 2023-09-12 NOTE — Discharge Summary (Signed)
 Physician Discharge Summary  Nicole SPIELBERG FMW:[id number] DOB: [birth year] DOA: 09/07/2023  PCP: Jodie Lavern LITTIE, MD  Admit date: 09/07/2023 Discharge date: 09/12/2023 30 Day Unplanned Readmission Risk Score    Flowsheet Row ED to Hosp-Admission (Current) from 09/07/2023 in [facility]  30 Day Unplanned Readmission Risk Score (%) 14.34 Filed at 09/12/2023 0801    This score is the patient's risk of an unplanned readmission within 30 days of being discharged (0 -100%). The score is based on dignosis, age, lab data, medications, orders, and past utilization.   Low:  0-14.9   Medium: 15-21.9   High: 22-29.9   Extreme: 30 and above          Admitted From: Home Disposition: Home  Recommendations for Outpatient Follow-up:  Follow up with PCP in 1-2 weeks Please obtain BMP/CBC in one week Follow-up with general surgery per the recommended time and date Please follow up with your PCP on the following pending results: Unresulted Labs (From admission, onward)     Start     Ordered   09/10/23 0915  Hepatitis B core antibody, total  Add-on,   AD        09/10/23 0915   09/08/23 0500  CBC  Daily,   R     Comments: While on heparin  drip    09/07/23 0759              Home Health: Yes Equipment/Devices: Walker  Discharge Condition: Stable CODE STATUS: Full code Diet recommendation: Cardiac/stick to soft diet for next 1 to 2 days  Subjective: Patient seen and examined.  She has very minimal abdominal pain which is expected postoperatively.  She tolerated regular diet this morning with breakfast without any complaints of nausea.  Patient is EXTREMELY ANXIOUS about her health issues all the time.  Although she has no complaints and she does not have any medical reasons, she was requesting to keep her overnight  just so I feel better and confident about going home.  I informed her that since patient does not have any medical reasons to stay in the hospital, does not  have any medical necessity, she is medically stable and has been cleared by general surgery, we will have to discharge her home.  I reassured her that everything looks good and she can just take it easy, limit her weight left to less than 5 to 10 pounds or what ever general surgery has told her.  Also, reminded her that I saw her yesterday afternoon postoperatively and the plan of care and the plan of discharge today was discussed with her as well as her daughter and son-in-law at the bedside.   Brief/Interim Summary: Nicole Jimenez is a 89 y.o. female with history of A-fib, hypertension, hyperlipidemia, chronic kidney disease stage III, diabetes mellitus type 2, thrombocytopenia was brought to the ER after patient started complaining of severe  abdominal pain radiating to her back and across the abdomen with nausea vomiting since last night.  Had a similar episode about a month ago which was self-limited.  Denies any diarrhea fever or chills.  Denies drinking alcohol.   In the ER patient's labs show elevated lipase with mildly elevated AST ALT and total bilirubin of 1.4.  CT abdomen pelvis shows cholelithiasis and ultrasound abdomen shows which is concerning for choledocholithiasis.  Gallbladder thickening was present but no Murphy sign.  Patient was admitted to hospital service, GI and general surgery consulted.  Details below.   Acute gallstone  pancreatitis/acute choledocholithiasis/dilated CBD/elevated LFTs/possible acute cholecystitis, POA: Seen by GI as well as general surgery.  Due to rising LFTs and bilirubin, MRCP completed which raised concern for choledocholithiasis, patient ended up having ERCP by Dr. Wilhelmenia on 09/09/2023, biliary sludge removed with sphincteroplasty and sphincterotomy performed. Evidence of cholelithiasis on cholangiogram with filling of the gallbladder.  Bilirubin and LFTs continue to improve.  She then underwent laparoscopic cholecystectomy by Dr. Ebbie 09/11/2023.  Doing  very well postoperatively, tolerating diet, very mild and expected postoperative pain.  Cleared by general surgery for discharge.  Prescribed her some Percocet at discharge.   Paroxysmal atrial fibrillation: Rates controlled, resume Coreg , Cardizem  and Eliquis .   Essential hypertension: Controlled.  Continue Cardizem  and Coreg .   Hypokalemia: Resolved.   CKD stage IIIb/IV: GFR labile remaining in the range of stage IIIb and IV.  Currently at baseline.   Hyperlipidemia: On Crestor , held due to elevated LFTs, holding it for another week, recommend resuming in 1 week and checking LFTs prior to that.   Chronic thrombocytopenia: Stable, no signs of bleeding.  Follow CBC.   Type 2 diabetes mellitus: Takes Jardiance  at home.  Last hemoglobin A1c 6.5.  Resume home meds.  Discharge plan was discussed with patient and/or family member and they verbalized understanding and agreed with it.  Discharge Diagnoses:  Principal Problem:   Acute pancreatitis Active Problems:   Essential hypertension   Type 2 diabetes mellitus with peripheral neuropathy (HCC)   Permanent atrial fibrillation (HCC)   Thrombocytopenia (HCC)   Chronic kidney disease, stage 3b (HCC)   Gastritis without bleeding   Obstructive jaundice   Abnormal LFTs   Gallstone pancreatitis    Discharge Instructions   Allergies as of 09/12/2023       Reactions   Amoxicillin-pot Clavulanate Other (See Comments)   unknown   Amoxicillin Other (See Comments)   unknown   Clarithromycin Other (See Comments)   unknown        Medication List     TAKE these medications    acetaminophen  500 MG tablet Commonly known as: TYLENOL  Take 2 tablets (1,000 mg total) by mouth every 6 (six) hours as needed.   apixaban  2.5 MG Tabs tablet Commonly known as: ELIQUIS  Take 1 tablet (2.5 mg total) by mouth 2 (two) times daily.   Calcium  Carb-Cholecalciferol 600-800 MG-UNIT Chew Chew 2 each by mouth daily.   carvedilol  12.5 MG  tablet Commonly known as: COREG  Take 1 tablet (12.5 mg total) by mouth 2 (two) times daily.   cycloSPORINE 0.05 % ophthalmic emulsion Commonly known as: RESTASIS Place 1 drop into both eyes 2 (two) times daily.   diclofenac  sodium 1 % Gel Commonly known as: VOLTAREN  APPLY 2 G TOPICALLY 4 (FOUR) TIMES DAILY AS NEEDED. **PA DENIED** What changed: See the new instructions.   diltiazem  180 MG 24 hr capsule Commonly known as: CARDIZEM  CD TAKE 1 CAPSULE (180 MG TOTAL) BY MOUTH EVERY MORNING   empagliflozin  10 MG Tabs tablet Commonly known as: Jardiance  Take 1 tablet (10 mg total) by mouth daily.   JOINT HEALTH PO Take 2 tablets by mouth daily.   Krill Oil 500 MG Caps Take 500 mg by mouth daily.   MULTIVITAMIN ADULT PO Take 1 tablet by mouth daily.   omeprazole  20 MG capsule Commonly known as: PRILOSEC Take 20 mg by mouth daily.   OneTouch Ultra test strip Generic drug: glucose blood USE TWICE DAILY TO CHECK BLOOD SUGAR - E11.40   oxyCODONE  5 MG immediate release  tablet Commonly known as: Oxy IR/ROXICODONE  Take 1 tablet (5 mg total) by mouth every 6 (six) hours as needed for moderate pain (pain score 4-6) or severe pain (pain score 7-10).   pantoprazole  40 MG tablet Commonly known as: PROTONIX  Take 1 tablet (40 mg total) by mouth daily.   rosuvastatin  40 MG tablet Commonly known as: CRESTOR  Take 1 tablet (40 mg total) by mouth daily. Start taking on: September 19, 2023 What changed: These instructions start on September 19, 2023. If you are unsure what to do until then, ask your doctor or other care provider.   vortioxetine  HBr 10 MG Tabs tablet Commonly known as: Trintellix  Take 1 tablet (10 mg total) by mouth daily.               Durable Medical Equipment  (From admission, onward)           Start     Ordered   09/12/23 1133  For home use only DME 4 wheeled rolling walker with seat  Once       Question:  Patient needs a walker to treat with the following  condition  Answer:  Debility   09/12/23 1132            Follow-up Information     Jodie Lavern CROME, MD Follow up in 1 week(s).   Specialty: Family Medicine Contact information: [address] [place] [address] [phone]         Maczis, Tonja Barban, PA-C. Call in 3 week(s).   Specialty: General Surgery Why: Arrive 30 mins prior to scheduled appointment time; Office will call you with appointment day and time. Contact information: [address] [facility] [place] [address] [phone]         [employer] Follow up.   Why: Technical sales engineer information: [address] [place], [place] [address]  Phone: [phone]        [facility], [facility] Follow up.   Specialty: Home Health Services Why: Home Health Physical and Occupational Therapy Contact information: [address] [place] [address] [phone]                Allergies  Allergen Reactions   Amoxicillin-Pot Clavulanate Other (See Comments)    unknown   Amoxicillin Other (See Comments)    unknown   Clarithromycin Other (See Comments)    unknown    Consultations: GI and general surgery   Procedures/Studies: DG ERCP Result Date: 09/10/2023 CLINICAL DATA:  Abnormal MRCP in a patient with jaundice and abnormal liver function studies. EXAM: ERCP 8 fluoroscopic views in once in a loop TECHNIQUE: Multiple spot images obtained with the fluoroscopic device and submitted for interpretation post-procedure. FLUOROSCOPY: Radiation Exposure Index (as provided by the fluoroscopic device): 12.34 mGy Kerma COMPARISON:  None Available. FINDINGS: Eight fluoroscopic images of the right upper quadrant intra procedural ERCP submitted for interpretation with 1 cine loop. Images demonstrate flexible endoscopy device with guidewire in the common bile duct. Mild dilatation of the bile duct with numerous filling defects consistent with choledocholithiasis.  Cine loop demonstrates balloon sweeping of the common bile duct. The cystic duct is patent and contrast can be seen flowing into the gallbladder which demonstrates cholelithiasis. No contrast extravasation. IMPRESSION: Choledocholithiasis and cholelithiasis. ERCP with balloon sweep of the common bile duct. These images were submitted for radiologic interpretation only. Please see the procedural report for the amount of contrast and the fluoroscopy time utilized. Electronically Signed  By: Cordella Banner   On: 09/10/2023 15:55   MR ABDOMEN MRCP WO CONTRAST Result Date: 09/08/2023 CLINICAL DATA:  Rule out choledocholithiasis EXAM: MRI ABDOMEN WITHOUT CONTRAST  (INCLUDING MRCP) TECHNIQUE: Multiplanar multisequence MR imaging of the abdomen was performed. Heavily T2-weighted images of the biliary and pancreatic ducts were obtained, and three-dimensional MRCP images were rendered by post processing. COMPARISON:  Ultrasound abdomen September 07, 2023 small right and trace left FINDINGS: Lower chest: Small right and trace left pleural effusions. Cardiomegaly. Hepatobiliary/MRCP: Mildly nodular liver morphology. No significant focal in liver lesion. Liver measures 12.8 cm in craniocaudal dimension. No significant hepatic steatosis. Gallbladder demonstrate increased wall thickness. Layer of intraluminal tiny stones/sludge along the posterior wall. No pericholecystic fluid. Phrygian cap identified. Common bile duct measures up to 6.5 mm, normal for age. There are a few filling defects overlying distal CBD on some of the MRCP images for example image 11/5 which may represent intraluminal sludge versus tiny nonobstructive choledocholithiasis or represent artifact. Pancreas: No mass, inflammatory changes, or other parenchymal abnormality identified. No pancreas divisum. Pancreatic duct is normal. Spleen: Spleen measures 9.5 cm in length with normal signal intensity. Adrenals/Urinary Tract: Bilateral simple T2 hyperintense renal  cortical cysts which does not require imaging follow-up. No hydronephrosis. Both adrenal glands are unremarkable. Stomach/Bowel: Visualized portions within the abdomen are unremarkable. Vascular/Lymphatic: No pathologically enlarged lymph nodes identified. No abdominal aortic aneurysm demonstrated. Other:  Small fat containing umbilical hernia partially visualized Musculoskeletal: No suspicious bone lesions identified. IMPRESSION: Gallbladder wall thickening with intraluminal layering stones/sludge. Prominent common bile duct with questionable nonobstructive intraluminal filling defects in distal CBD, possibly sludge. Punctate choledocholithiasis cannot be excluded. Cardiomegaly. Bilateral pleural effusions. Bilateral renal cortical cysts which does not require imaging follow-up. Electronically Signed   By: Megan  Zare M.D.   On: 09/08/2023 12:30   MR 3D Recon At Scanner Result Date: 09/08/2023 CLINICAL DATA:  Rule out choledocholithiasis EXAM: MRI ABDOMEN WITHOUT CONTRAST  (INCLUDING MRCP) TECHNIQUE: Multiplanar multisequence MR imaging of the abdomen was performed. Heavily T2-weighted images of the biliary and pancreatic ducts were obtained, and three-dimensional MRCP images were rendered by post processing. COMPARISON:  Ultrasound abdomen September 07, 2023 small right and trace left FINDINGS: Lower chest: Small right and trace left pleural effusions. Cardiomegaly. Hepatobiliary/MRCP: Mildly nodular liver morphology. No significant focal in liver lesion. Liver measures 12.8 cm in craniocaudal dimension. No significant hepatic steatosis. Gallbladder demonstrate increased wall thickness. Layer of intraluminal tiny stones/sludge along the posterior wall. No pericholecystic fluid. Phrygian cap identified. Common bile duct measures up to 6.5 mm, normal for age. There are a few filling defects overlying distal CBD on some of the MRCP images for example image 11/5 which may represent intraluminal sludge versus tiny  nonobstructive choledocholithiasis or represent artifact. Pancreas: No mass, inflammatory changes, or other parenchymal abnormality identified. No pancreas divisum. Pancreatic duct is normal. Spleen: Spleen measures 9.5 cm in length with normal signal intensity. Adrenals/Urinary Tract: Bilateral simple T2 hyperintense renal cortical cysts which does not require imaging follow-up. No hydronephrosis. Both adrenal glands are unremarkable. Stomach/Bowel: Visualized portions within the abdomen are unremarkable. Vascular/Lymphatic: No pathologically enlarged lymph nodes identified. No abdominal aortic aneurysm demonstrated. Other:  Small fat containing umbilical hernia partially visualized Musculoskeletal: No suspicious bone lesions identified. IMPRESSION: Gallbladder wall thickening with intraluminal layering stones/sludge. Prominent common bile duct with questionable nonobstructive intraluminal filling defects in distal CBD, possibly sludge. Punctate choledocholithiasis cannot be excluded. Cardiomegaly. Bilateral pleural effusions. Bilateral renal cortical cysts which does not require imaging  follow-up. Electronically Signed   By: Megan  Zare M.D.   On: 09/08/2023 12:30   US  Abdomen Limited RUQ (LIVER/GB) Result Date: 09/07/2023 CLINICAL DATA:  89 year old female with abdominal pain. Cholelithiasis on CT Abdomen and Pelvis today. EXAM: ULTRASOUND ABDOMEN LIMITED RIGHT UPPER QUADRANT COMPARISON:  Noncontrast CT Abdomen and Pelvis 0036 hours. Previous CT Abdomen and Pelvis 04/14/2022 FINDINGS: Gallbladder: Abnormal gallbladder wall thickening and heterogeneity up to 10 mm on image 7. Incompletely distended gallbladder. Evidence of small nonshadowing echogenic stones (images 8 and 13). Individual stone size estimated up to 8 mm. However, no sonographic Murphy sign elicited. Common bile duct: Diameter: Dilated up to 8 mm, concordant with CT today (image 16). New from 2024 CT. Possible sludge within the duct. No stone  identified within the visible duct. Liver: Mild if any intrahepatic biliary ductal dilatation. No discrete liver lesion. Background echogenicity within normal limits. But the liver contour does appear mildly nodular (image 33). Portal vein is patent on color Doppler imaging with normal direction of blood flow towards the liver. Other: Negative visible right kidney. No right upper quadrant free fluid. IMPRESSION: 1. Small gallstones with new CBD enlargement since 2024, suspicious for Choledocholithiasis. 2. Furthermore, there is Abnormal Gallbladder Wall Thickening and heterogeneity. However, no sonographic Murphy sign elicited to strongly indicate acute cholecystitis. 3. Mildly nodular liver contour, early Cirrhosis not excluded. Electronically Signed   By: VEAR Hurst M.D.   On: 09/07/2023 06:21   CT Renal Stone Study Result Date: 09/07/2023 CLINICAL DATA:  Right upper quadrant abdominal pain, flank pain EXAM: CT ABDOMEN AND PELVIS WITHOUT CONTRAST TECHNIQUE: Multidetector CT imaging of the abdomen and pelvis was performed following the standard protocol without IV contrast. RADIATION DOSE REDUCTION: This exam was performed according to the departmental dose-optimization program which includes automated exposure control, adjustment of the mA and/or kV according to patient size and/or use of iterative reconstruction technique. COMPARISON:  05/09/2022 FINDINGS: Lower chest: No acute abnormality.  Cardiomegaly. Hepatobiliary: Cholelithiasis without superimposed pericholecystic inflammatory change. Liver unremarkable on this noncontrast examination. No intra or extrahepatic biliary ductal dilation. Pancreas: Unremarkable Spleen: Unremarkable Adrenals/Urinary Tract: Adrenal glands are unremarkable. Kidneys are normal, without renal calculi, focal lesion, or hydronephrosis. Bladder is unremarkable. Stomach/Bowel: Stomach is within normal limits. Appendix appears normal. No evidence of bowel wall thickening, distention, or  inflammatory changes. Vascular/Lymphatic: Aortic atherosclerosis. No enlarged abdominal or pelvic lymph nodes. Reproductive: Multiple calcified involuted uterine fibroids are seen within the uterus. The pelvic organs are otherwise unremarkable. Other: No abdominal wall hernia or abnormality. No abdominopelvic ascites. Musculoskeletal: No acute bone abnormality. No lytic or blastic bone lesion. Osseous structures are age appropriate. IMPRESSION: 1. No acute intra-abdominal pathology identified. No urolithiasis. 2. Cholelithiasis. 3. Cardiomegaly. Aortic Atherosclerosis (ICD10-I70.0). Electronically Signed   By: Dorethia Molt M.D.   On: 09/07/2023 01:12     Discharge Exam: Vitals:   09/12/23 0027 09/12/23 0439  BP: 127/89 (!) 146/80  Pulse: 75 64  Resp: 18 18  Temp: 97.6 F (36.4 C) 97.7 F (36.5 C)  SpO2: 93% 99%   Vitals:   09/11/23 2155 09/11/23 2157 09/12/23 0027 09/12/23 0439  BP: 119/66 119/66 127/89 (!) 146/80  Pulse: (!) 57 (!) 58 75 64  Resp: 15 15 18 18   Temp: 97.7 F (36.5 C) 97.7 F (36.5 C) 97.6 F (36.4 C) 97.7 F (36.5 C)  TempSrc: Oral     SpO2:  97% 93% 99%  Weight:      Height:  General: Pt is alert, awake, not in acute distress Cardiovascular: RRR, S1/S2 +, no rubs, no gallops Respiratory: CTA bilaterally, no wheezing, no rhonchi Abdominal: Soft, NT, ND, bowel sounds + Extremities: no edema, no cyanosis    The results of significant diagnostics from this hospitalization (including imaging, microbiology, ancillary and laboratory) are listed below for reference.     Microbiology: No results found for this or any previous visit (from the past 240 hours).   Labs: BNP (last 3 results) No results for input(s): BNP in the last 8760 hours. Basic Metabolic Panel: Recent Labs  Lab 09/08/23 0444 09/09/23 0332 09/10/23 0423 09/11/23 0307 09/11/23 1028 09/12/23 0422  NA 135 135 138 136  --  136  K 2.9* 3.7 4.0 3.0*  --  4.1  CL 103 106 109 105   --  103  CO2 20* 19* 23 23  --  24  GLUCOSE 100* 130* 147* 141*  --  239*  BUN 24* 22 20 23   --  26*  CREATININE 0.95 1.23* 1.36* 1.30*  --  1.31*  CALCIUM  8.5* 8.8* 9.0 8.6*  --  8.8*  MG  --   --   --   --  2.1  --    Liver Function Tests: Recent Labs  Lab 09/08/23 0444 09/09/23 0332 09/10/23 0423 09/11/23 0307 09/12/23 0422  AST 751* 335* 160* 101* 67*  ALT 588* 366* 275* 212* 148*  ALKPHOS 162* 189* 190* 179* 166*  BILITOT 5.2* 5.6* 4.8* 2.2* 1.6*  PROT 7.1 6.7 6.7 6.2* 6.1*  ALBUMIN 3.0* 2.8* 2.6* 2.5* 2.6*   Recent Labs  Lab 09/07/23 0029 09/09/23 0332  LIPASE >2,800* 35   No results for input(s): AMMONIA in the last 168 hours. CBC: Recent Labs  Lab 09/07/23 0029 09/07/23 0734 09/08/23 0444 09/09/23 0332 09/10/23 0423 09/11/23 0307 09/12/23 0422  WBC 8.7 6.9 7.2 4.9 4.6 5.7 9.2  NEUTROABS 6.9 6.2  --   --   --   --   --   HGB 13.2 12.3 12.0 11.4* 12.6 12.5 12.9  HCT 40.9 39.2 37.7 36.4 40.4 40.0 40.3  MCV 88.7 89.5 90.8 90.5 90.2 88.7 90.2  PLT 133* 102* 92* 83* 86* 91* 95*   Cardiac Enzymes: No results for input(s): CKTOTAL, CKMB, CKMBINDEX, TROPONINI in the last 168 hours. BNP: Invalid input(s): POCBNP CBG: Recent Labs  Lab 09/11/23 1951 09/12/23 0024 09/12/23 0421 09/12/23 0804 09/12/23 1128  GLUCAP 300* 333* 218* 143* 219*   D-Dimer No results for input(s): DDIMER in the last 72 hours. Hgb A1c No results for input(s): HGBA1C in the last 72 hours. Lipid Profile No results for input(s): CHOL, HDL, LDLCALC, TRIG, CHOLHDL, LDLDIRECT in the last 72 hours. Thyroid  function studies No results for input(s): TSH, T4TOTAL, T3FREE, THYROIDAB in the last 72 hours.  Invalid input(s): FREET3 Anemia work up No results for input(s): VITAMINB12, FOLATE, FERRITIN, TIBC, IRON, RETICCTPCT in the last 72 hours. Urinalysis    Component Value Date/Time   COLORURINE COLORLESS (A) 09/07/2023 0314    APPEARANCEUR CLEAR 09/07/2023 0314   LABSPEC 1.009 09/07/2023 0314   PHURINE 8.0 09/07/2023 0314   GLUCOSEU >1,000 (A) 09/07/2023 0314   HGBUR NEGATIVE 09/07/2023 0314   BILIRUBINUR NEGATIVE 09/07/2023 0314   KETONESUR NEGATIVE 09/07/2023 0314   PROTEINUR TRACE (A) 09/07/2023 0314   NITRITE NEGATIVE 09/07/2023 0314   LEUKOCYTESUR NEGATIVE 09/07/2023 0314   Sepsis Labs Recent Labs  Lab 09/09/23 0332 09/10/23 0423 09/11/23 0307 09/12/23 0422  WBC  4.9 4.6 5.7 9.2   Microbiology No results found for this or any previous visit (from the past 240 hours).  FURTHER DISCHARGE INSTRUCTIONS:   Get Medicines reviewed and adjusted: Please take all your medications with you for your next visit with your Primary MD   Laboratory/radiological data: Please request your Primary MD to go over all hospital tests and procedure/radiological results at the follow up, please ask your Primary MD to get all Hospital records sent to his/her office.   In some cases, they will be blood work, cultures and biopsy results pending at the time of your discharge. Please request that your primary care M.D. goes through all the records of your hospital data and follows up on these results.   Also Note the following: If you experience worsening of your admission symptoms, develop shortness of breath, life threatening emergency, suicidal or homicidal thoughts you must seek medical attention immediately by calling 911 or calling your MD immediately  if symptoms less severe.   You must read complete instructions/literature along with all the possible adverse reactions/side effects for all the Medicines you take and that have been prescribed to you. Take any new Medicines after you have completely understood and accpet all the possible adverse reactions/side effects.    patient was instructed, not to drive, operate heavy machinery, perform activities at heights, swimming or participation in water activities or provide  baby-sitting services while on Pain, Sleep and Anxiety Medications; until their outpatient Physician has advised to do so again. Also recommended to not to take more than prescribed Pain, Sleep and Anxiety Medications.  It is not advisable to combine anxiety, sleep and pain medications without talking with your primary care provider.     Wear Seat belts while driving.   Please note: You were cared for by a hospitalist during your hospital stay. Once you are discharged, your primary care physician will handle any further medical issues. Please note that NO REFILLS for any discharge medications will be authorized once you are discharged, as it is imperative that you return to your primary care physician (or establish a relationship with a primary care physician if you do not have one) for your post hospital discharge needs so that they can reassess your need for medications and monitor your lab values  Time coordinating discharge: Over 30 minutes  SIGNED:   Fredia Skeeter, MD  [facility] 09/12/2023, 11:57 AM *Please note that this is a verbal dictation therefore any spelling or grammatical errors are due to the Dragon Medical One system interpretation. If 7PM-7AM, please contact night-coverage [email or web address]

## 2023-09-12 NOTE — Evaluation (Signed)
 Physical Therapy Evaluation Patient Details Name: Nicole Jimenez MRN: [id number] DOB: Jan 19, 1935 Today's Date: 09/12/2023  History of Present Illness  Pt is a 89 yr old female who presented 09/07/23 due to abdominal pain. Pt admitted for acute pancreatitis secondary likely to gallstones. Pt on 09/09/23 had ERCP and then post laparoscopic  cholecystectomy on 09/11/23. PMH: A-fib, hypertension, hyperlipidemia, chronic kidney disease stage III, diabetes mellitus type 2, thrombocytopenia  Clinical Impression  Pt admitted with above diagnosis. At baseline, pt is independent. She lives with her son who does work.  Today, pt expressed anxious about pain and return home. She was able to ambulate 200' with supervision and performed stairs similar to home set up.  Educated on safety, recommendations, and rollator use. Pt is below her baseline - do recommend HHPT. Pt currently with functional limitations due to the deficits listed below (see PT Problem List). Pt will benefit from acute skilled PT to increase their independence and safety with mobility to allow discharge.           If plan is discharge home, recommend the following: A little help with walking and/or transfers;A little help with bathing/dressing/bathroom;Assistance with cooking/housework;Help with stairs or ramp for entrance   Can travel by private vehicle        Equipment Recommendations Rollator (4 wheels)  Recommendations for Other Services       Functional Status Assessment Patient has had a recent decline in their functional status and demonstrates the ability to make significant improvements in function in a reasonable and predictable amount of time.     Precautions / Restrictions Precautions Precautions: Fall      Mobility  Bed Mobility Overal bed mobility: Modified Independent             General bed mobility comments: Pt in chair at arrival.  Was mod I with OT with HOB slightly elevated    Transfers Overall  transfer level: Needs assistance Equipment used: Rollator (4 wheels) Transfers: Sit to/from Stand Sit to Stand: Supervision, Contact guard assist           General transfer comment: STS from recliner x 1 and rollator x 1.  Supervision from recliner.  Cues and CGA from rollator    Ambulation/Gait Ambulation/Gait assistance: Contact guard assist, Supervision Gait Distance (Feet): 200 Feet Assistive device: Rollator (4 wheels) Gait Pattern/deviations: Step-through pattern, Decreased stride length Gait velocity: decreased     General Gait Details: started CGA progressed to supervision; cues on rollator use; mild unsteadiness but no overt LOB; able to make turns and navigate obstacles  Stairs Stairs: Yes Stairs assistance: Contact guard assist Stair Management: One rail Right Number of Stairs: 3 General stair comments: CGA for safety  Wheelchair Mobility     Tilt Bed    Modified Rankin (Stroke Patients Only)       Balance Overall balance assessment: Needs assistance Sitting-balance support: Feet supported Sitting balance-Leahy Scale: Good     Standing balance support: No upper extremity supported, Bilateral upper extremity supported Standing balance-Leahy Scale: Fair Standing balance comment: ambulated wtih rollator but able to stand without UE support                             Pertinent Vitals/Pain Pain Assessment Pain Assessment: Faces Faces Pain Scale: Hurts a little bit Pain Location: abdominal/headache Pain Descriptors / Indicators: Sore Pain Intervention(s): Limited activity within patient's tolerance, Monitored during session, Repositioned, Other (comment) (educated on positions of  comfort for sleep (head elevated, pillow under knee) and hugging pillow if coughing/sneezing/etc)    Home Living Family/patient expects to be discharged to:: Private residence Living Arrangements: Children Available Help at Discharge: Family;Available  PRN/intermittently Type of Home: House Home Access: Stairs to enter Entrance Stairs-Rails: Right (front has bil but prefers garage with R only) Entrance Stairs-Number of Steps: 2   Home Layout: Two level;Able to live on main level with bedroom/bathroom Home Equipment: Rolling Walker (2 wheels);Grab bars - toilet;Grab bars - tub/shower      Prior Function Prior Level of Function : Independent/Modified Independent;Driving             Mobility Comments: indep ADLs Comments: indep     Extremity/Trunk Assessment   Upper Extremity Assessment Upper Extremity Assessment: Overall WFL for tasks assessed    Lower Extremity Assessment Lower Extremity Assessment: Overall WFL for tasks assessed    Cervical / Trunk Assessment Cervical / Trunk Assessment: Normal  Communication   Communication Communication: No apparent difficulties    Cognition Arousal: Alert Behavior During Therapy: Anxious   PT - Cognitive impairments: No apparent impairments                       PT - Cognition Comments: anxious about return home Following commands: Intact       Cueing       General Comments General comments (skin integrity, edema, etc.): VSS. Educated on positions of comfort, frequent short bouts of activity at home (~5-10 mins every 1-2 hours), supervision for stairs, rollator use, no heavy lifting    Exercises     Assessment/Plan    PT Assessment Patient needs continued PT services  PT Problem List Decreased strength;Decreased mobility;Decreased range of motion;Decreased activity tolerance;Decreased balance;Decreased knowledge of use of DME       PT Treatment Interventions DME instruction;Therapeutic exercise;Gait training;Functional mobility training;Stair training;Therapeutic activities;Patient/family education;Balance training;Modalities    PT Goals (Current goals can be found in the Care Plan section)  Acute Rehab PT Goals Patient Stated Goal: return to  normal PT Goal Formulation: With patient/family Time For Goal Achievement: 09/26/23 Potential to Achieve Goals: Good    Frequency Min 1X/week     Co-evaluation               AM-PAC PT 6 Clicks Mobility  Outcome Measure Help needed turning from your back to your side while in a flat bed without using bedrails?: A Little Help needed moving from lying on your back to sitting on the side of a flat bed without using bedrails?: A Little Help needed moving to and from a bed to a chair (including a wheelchair)?: A Little Help needed standing up from a chair using your arms (e.g., wheelchair or bedside chair)?: A Little Help needed to walk in hospital room?: A Little Help needed climbing 3-5 steps with a railing? : A Little 6 Click Score: 18    End of Session Equipment Utilized During Treatment: Gait belt Activity Tolerance: Patient tolerated treatment well Patient left: in chair;with call bell/phone within reach;with family/visitor present Nurse Communication: Mobility status PT Visit Diagnosis: Other abnormalities of gait and mobility (R26.89);Muscle weakness (generalized) (M62.81)    Time: 8983-8954 PT Time Calculation (min) (ACUTE ONLY): 29 min   Charges:   PT Evaluation $PT Eval Low Complexity: 1 Low PT Treatments $Gait Training: 8-22 mins PT General Charges $$ ACUTE PT VISIT: 1 Visit         Jesse Nosbisch, PT Acute Rehab Services  [facility] [phone]   Benjiman VEAR Mulberry 09/12/2023, 10:59 AM

## 2023-09-12 NOTE — Progress Notes (Signed)
 Oxycodone  in secure bag returned to [facility] by this RN. Pt refused med- pantoprazole  delivered to pt in room by this RN

## 2023-09-12 NOTE — Progress Notes (Signed)
 Progress Note  1 Day Post-Op  Subjective: Patient reports minimal abdominal pain that is more prominent of the right upper quadrant. She feels that medications are helping manage her pain. Denies bowel movement since her procedure yesterday 8/3. Reports flatulence. Tolerating diet. Denies nausea and vomiting.   Patient feels that she is mobilizing well.  She does not have work that requires heavy lifting or increased movement.  ROS  All negative with the exception of above.   Objective: Vital signs in last 24 hours: Temp:  [96 F (35.6 C)-98.7 F (37.1 C)] 97.7 F (36.5 C) (08/04 0439) Pulse Rate:  [47-75] 64 (08/04 0439) Resp:  [14-18] 18 (08/04 0439) BP: (108-164)/(64-89) 146/80 (08/04 0439) SpO2:  [93 %-100 %] 99 % (08/04 0439) Last BM Date :  (PTA)  Intake/Output from previous day: 08/03 0701 - 08/04 0700 In: 340 [P.O.:240; IV Piggyback:100] Out: 5 [Blood:5] Intake/Output this shift: Total I/O In: 120 [P.O.:120] Out: -   PE: General: Pleasant, female who is sitting in chair at bedside. Lungs: Respiratory effort nonlabored Abd: Soft and ND. Mild generalized abdominal tenderness that is more prominent of right upper quadrant. Incisions C/D/I Psych: A&Ox3 with an appropriate affect.    Lab Results:  Recent Labs    09/11/23 0307 09/12/23 0422  WBC 5.7 9.2  HGB 12.5 12.9  HCT 40.0 40.3  PLT 91* 95*   BMET Recent Labs    09/11/23 0307 09/12/23 0422  NA 136 136  K 3.0* 4.1  CL 105 103  CO2 23 24  GLUCOSE 141* 239*  BUN 23 26*  CREATININE 1.30* 1.31*  CALCIUM  8.6* 8.8*   PT/INR No results for input(s): LABPROT, INR in the last 72 hours. CMP     Component Value Date/Time   NA 136 09/12/2023 0422   K 4.1 09/12/2023 0422   CL 103 09/12/2023 0422   CO2 24 09/12/2023 0422   GLUCOSE 239 (H) 09/12/2023 0422   BUN 26 (H) 09/12/2023 0422   CREATININE 1.31 (H) 09/12/2023 0422   CREATININE 0.95 (H) 01/24/2017 1621   CALCIUM  8.8 (L) 09/12/2023  0422   PROT 6.1 (L) 09/12/2023 0422   ALBUMIN 2.6 (L) 09/12/2023 0422   AST 67 (H) 09/12/2023 0422   ALT 148 (H) 09/12/2023 0422   ALKPHOS 166 (H) 09/12/2023 0422   BILITOT 1.6 (H) 09/12/2023 0422   GFRNONAA 40 (L) 09/12/2023 0422   Lipase     Component Value Date/Time   LIPASE 35 09/09/2023 0332       Studies/Results: No results found.  Anti-infectives: Anti-infectives (From admission, onward)    Start     Dose/Rate Route Frequency Ordered Stop   09/11/23 0600  ceFAZolin  (ANCEF ) IVPB 2g/100 mL premix        2 g 200 mL/hr over 30 Minutes Intravenous On call to O.R. 09/10/23 1327 09/11/23 0813        Assessment/Plan Biliary pancreatitis Cholecystis Choledocholithiasis  S/P ERCP 09/09/2023 with Dr. Wilhelmenia POD1: S/P Laparoscopic cholecystectomy with intraoperative cholangiogram Dr. Donnice Bury on 09/11/2023. - Afebrile. Mild hypertension. - Exam without significant concerns - WBC 9.2 from 5.7 - Cr 1.31 from 1.30.  - AST 67 from 101; ALT 148 from 212; Alkaline Phosphatase 166 from 179; Total Bilirubin 1.6 from 2.2 - Continue to mobilize as tolerated. - Tolerating heart healthy diet well. Continue as tolerated. - Discussed post-operative expectations and restrictions. Will arrange outpatient follow up in 3-4 weeks.  - Surgically stable for discharge. Please call with questions  or concerns.    FEN: Heart diet VTE: Should be able to resume Eliquis  tomorrow ID: None    LOS: 5 days   I reviewed specialty notes, hospitalist notes, last 24 h vitals and pain scores, last 48 h intake and output, last 24 h labs and trends, and last 24 h imaging results.   Marjorie Carlyon Favre, [facility] 09/12/2023, 11:16 AM Please see Amion for pager number during day hours 7:00am-4:30pm

## 2023-09-12 NOTE — TOC Transition Note (Signed)
 Transition of Care [facility]) - Discharge Note   Patient Details  Name: Nicole Jimenez MRN: [id number] Date of Birth: Jun 21, 1934  Transition of Care [facility]) CM/SW Contact:  Alfonse JONELLE Rex, RN Phone Number: 09/12/2023, 11:39 AM   Clinical Narrative:   PT eval completed, recommendation for [facility] PT/OT and Rollator. Hedda, rep-Cory, accepted for [facility] PT/OT; [employer], rep-Jermaine, to deliver Rollator to bedside. No further TOC needs identified at this time.     Final next level of care: Home w Home Health Services Barriers to Discharge: Barriers Resolved   Patient Goals and CMS Choice Patient states their goals for this hospitalization and ongoing recovery are:: return home CMS Medicare.gov Compare Post Acute Care list provided to:: Patient Choice offered to / list presented to : Patient [place] ownership interest in [facility].provided to:: Patient    Discharge Placement                       Discharge Plan and Services Additional resources added to the After Visit Summary for                  DME Arranged: Walker rolling with seat DME Agency: [employer] Date DME Agency Contacted: 09/12/23 Time DME Agency Contacted: [phone] Representative spoke with at DME Agency: Jermiine HH Arranged: OT, PT HH Agency: [facility] Date [facility] Agency Contacted: 09/12/23 Time HH Agency Contacted: [phone] Representative spoke with at [facility] Agency: Darleene  Social Drivers of Health (SDOH) Interventions SDOH Screenings   Food Insecurity: No Food Insecurity (09/07/2023)  Housing: Low Risk  (09/07/2023)  Transportation Needs: No Transportation Needs (09/07/2023)  Utilities: Not At Risk (09/07/2023)  Alcohol Screen: Low Risk  (06/30/2023)  Depression (PHQ2-9): Low Risk  (08/23/2023)  Financial Resource Strain: Low Risk  (06/30/2023)  Physical Activity: Unknown (08/22/2023)  Recent Concern: Physical Activity - Inactive (06/30/2023)  Social Connections: Moderately Integrated  (09/07/2023)  Stress: Stress Concern Present (08/22/2023)  Tobacco Use: Low Risk  (09/09/2023)  Health Literacy: Adequate Health Literacy (06/30/2023)     Readmission Risk Interventions    09/12/2023   11:37 AM 09/07/2023   11:36 AM  Readmission Risk Prevention Plan  Post Dischage Appt Complete Complete  Medication Screening Complete Complete  Transportation Screening Complete Complete

## 2023-09-12 NOTE — Progress Notes (Signed)
 AVS reviewed w/ pt and son who verbalized an understanding. tele removed by primary RN. PIV removed as noted. Pt dressed for d/c to home. Rollator delivered at 1215.

## 2023-09-12 NOTE — Anesthesia Postprocedure Evaluation (Signed)
 Anesthesia Post Note  Patient: Nicole Jimenez  Procedure(s) Performed: ERCP, WITH INTERVENTION IF INDICATED     Patient location during evaluation: PACU Anesthesia Type: General Level of consciousness: awake and alert Pain management: pain level controlled Vital Signs Assessment: post-procedure vital signs reviewed and stable Respiratory status: spontaneous breathing, nonlabored ventilation and respiratory function stable Cardiovascular status: stable and blood pressure returned to baseline Anesthetic complications: no   No notable events documented.  Last Vitals:  Vitals:   09/12/23 0027 09/12/23 0439  BP: 127/89 (!) 146/80  Pulse: 75 64  Resp: 18 18  Temp: 36.4 C 36.5 C  SpO2: 93% 99%    Last Pain:  Vitals:   09/12/23 0110  TempSrc:   PainSc: 3                  Debby FORBES Like

## 2023-09-12 NOTE — Progress Notes (Signed)
 Occupational Therapy Evaluation Patient Details Name: Nicole Jimenez MRN: [id number] DOB: April 29, 1934 Today's Date: 09/12/2023   History of Present Illness   Pt is a 89 yr old female who presented 09/07/23 due to abdominal pain. Pt admitted for acute pancreatitis secondary likely to gallstones. Pt on 09/09/23 had ERCP and then post laparoscopic  cholecystectomy on 09/11/23. PMH: A-fib, hypertension, hyperlipidemia, chronic kidney disease stage III, diabetes mellitus type 2, thrombocytopenia     Clinical Impressions Pt reported at PLOF they live alone and they do not use any DME. Pt at this time report her son can assist but will vary as they work at [employer]. She was able to complete ADLS with increase in time due to abdominal pain and feeling slightly woozy with supervision to CGA. Pt would like HH with the return to home but reported no DME needs.      If plan is discharge home, recommend the following:   Assistance with cooking/housework;Assist for transportation;Help with stairs or ramp for entrance     Functional Status Assessment   Patient has had a recent decline in their functional status and demonstrates the ability to make significant improvements in function in a reasonable and predictable amount of time.     Equipment Recommendations   None recommended by OT     Recommendations for Other Services         Precautions/Restrictions   Precautions Precautions: Fall Recall of Precautions/Restrictions: Intact Restrictions Weight Bearing Restrictions Per Provider Order: No     Mobility Bed Mobility Overal bed mobility: Modified Independent             General bed mobility comments: HOB slightly elevated    Transfers Overall transfer level: Needs assistance Equipment used: Rolling walker (2 wheels) Transfers: Sit to/from Stand Sit to Stand: Supervision                  Balance Overall balance assessment: Needs assistance Sitting-balance support:  Feet supported Sitting balance-Leahy Scale: Good     Standing balance support: Bilateral upper extremity supported, Single extremity supported Standing balance-Leahy Scale: Fair                             ADL either performed or assessed with clinical judgement   ADL Overall ADL's : Needs assistance/impaired Eating/Feeding: Independent;Sitting   Grooming: Wash/dry hands;Contact guard assist;Standing   Upper Body Bathing: Set up;Sitting   Lower Body Bathing: Contact guard assist;Sit to/from stand   Upper Body Dressing : Set up;Sitting   Lower Body Dressing: Contact guard assist;Sit to/from stand   Toilet Transfer: Rolling walker (2 wheels);Supervision/safety   Toileting- Architect and Hygiene: Supervision/safety;Sit to/from stand       Functional mobility during ADLs: Contact guard assist;Rolling walker (2 wheels)       Vision Baseline Vision/History: 1 Wears glasses Ability to See in Adequate Light: 0 Adequate Patient Visual Report: No change from baseline Vision Assessment?: Wears glasses for reading;Wears glasses for driving     Perception Perception: Within Functional Limits       Praxis Praxis: WFL       Pertinent Vitals/Pain Pain Assessment Pain Assessment: Faces Faces Pain Scale: Hurts little more Pain Location: abdominal/headache Pain Descriptors / Indicators: Aching Pain Intervention(s): Limited activity within patient's tolerance, Monitored during session, Repositioned     Extremity/Trunk Assessment Upper Extremity Assessment Upper Extremity Assessment: Overall WFL for tasks assessed (arthritis)   Lower Extremity Assessment Lower Extremity Assessment:  Defer to PT evaluation   Cervical / Trunk Assessment Cervical / Trunk Assessment: Kyphotic   Communication Communication Communication: No apparent difficulties   Cognition Arousal: Alert Behavior During Therapy: WFL for tasks assessed/performed Cognition: No  apparent impairments                               Following commands: Intact       Cueing  General Comments   Cueing Techniques: Verbal cues      Exercises     Shoulder Instructions      Home Living Family/patient expects to be discharged to:: Private residence Living Arrangements: Children Available Help at Discharge: Family Type of Home: House Home Access: Stairs to enter Secretary/administrator of Steps: 2 Entrance Stairs-Rails: Left;Right (font has rail on bothside and can reach both, but through the garage R side only which is how they normally enter the home) Home Layout: Two level;Able to live on main level with bedroom/bathroom     Bathroom Shower/Tub: Producer, television/film/video: Standard     Home Equipment: Agricultural consultant (2 wheels);Grab bars - toilet;Grab bars - tub/shower          Prior Functioning/Environment Prior Level of Function : Independent/Modified Independent;Driving             Mobility Comments: indep ADLs Comments: indep    OT Problem List: Decreased strength;Impaired balance (sitting and/or standing);Decreased knowledge of use of DME or AE   OT Treatment/Interventions: Self-care/ADL training;Therapeutic exercise;DME and/or AE instruction;Therapeutic activities;Patient/family education;Balance training      OT Goals(Current goals can be found in the care plan section)   Acute Rehab OT Goals Patient Stated Goal: to stay one more day OT Goal Formulation: With patient Time For Goal Achievement: 09/26/23 Potential to Achieve Goals: Good   OT Frequency:  Min 2X/week    Co-evaluation              AM-PAC OT 6 Clicks Daily Activity     Outcome Measure Help from another person eating meals?: None Help from another person taking care of personal grooming?: A Little Help from another person toileting, which includes using toliet, bedpan, or urinal?: A Little Help from another person bathing (including  washing, rinsing, drying)?: A Little Help from another person to put on and taking off regular upper body clothing?: None Help from another person to put on and taking off regular lower body clothing?: A Little 6 Click Score: 20   End of Session Equipment Utilized During Treatment: Gait belt;Rolling walker (2 wheels) Nurse Communication: Mobility status  Activity Tolerance: Patient tolerated treatment well Patient left: in chair;with call bell/phone within reach  OT Visit Diagnosis: Unsteadiness on feet (R26.81);Other abnormalities of gait and mobility (R26.89);Repeated falls (R29.6);Muscle weakness (generalized) (M62.81);Pain Pain - Right/Left:  (abdomen and head)                Time: 9256-9181 OT Time Calculation (min): 35 min Charges:  OT General Charges $OT Visit: 1 Visit OT Evaluation $OT Eval Low Complexity: 1 Low OT Treatments $Self Care/Home Management : 8-22 mins  Warrick POUR OTR/L  Acute Rehab Services  [phone] office number   Warrick Berber 09/12/2023, 8:26 AM

## 2023-09-13 ENCOUNTER — Telehealth: Payer: Self-pay

## 2023-09-13 ENCOUNTER — Ambulatory Visit: Payer: Self-pay | Admitting: Gastroenterology

## 2023-09-13 ENCOUNTER — Telehealth: Payer: Self-pay | Admitting: *Deleted

## 2023-09-13 LAB — SURGICAL PATHOLOGY

## 2023-09-13 NOTE — Telephone Encounter (Signed)
 Transition Care Management Follow-up Telephone Call Date of discharge and from where: 09/12/23 [facility] How have you been since you were released from the hospital? Better slowly getting strength back Any questions or concerns? No  Items Reviewed: Did the pt receive and understand the discharge instructions provided? Yes  Medications obtained and verified? Yes  Other? No  Any new allergies since your discharge? No  Dietary orders reviewed? Yes Do you have support at home? Yes   Home Care and Equipment/Supplies: Were home health services ordered? yes If so, what is the name of the agency? Pt unsure, agency calling to schedule  Has the agency set up a time to come to the patient's home? no Were any new equipment or medical supplies ordered?  No What is the name of the medical supply agency? N/A Were you able to get the supplies/equipment? not applicable Do you have any questions related to the use of the equipment or supplies? No  Functional Questionnaire: (I = Independent and D = Dependent) ADLs: I  Bathing/Dressing- I  Meal Prep- I  Eating- I  Maintaining continence- I  Transferring/Ambulation- I  Managing Meds- I  Follow up appointments reviewed:  PCP Hospital f/u appt confirmed? Yes  Scheduled to see Lavern Heck, MD on 09/19/23 @ 11am. [facility] f/u appt confirmed? No   Are transportation arrangements needed? No  If their condition worsens, is the pt aware to call PCP or go to the Emergency Dept.? Yes Was the patient provided with contact information for the PCP's office or ED? Yes Was to pt encouraged to call back with questions or concerns? Yes

## 2023-09-13 NOTE — Transitions of Care (Post Inpatient/ED Visit) (Signed)
   09/13/2023  Name: IVETTE CASTRONOVA MRN: [id number] DOB: 1934/11/16  Today's TOC FU Call Status: Today's TOC FU Call Status:: Unsuccessful Call (1st Attempt) Unsuccessful Call (1st Attempt) Date: 09/13/23  Attempted to reach the patient regarding the most recent Inpatient/ED visit.  Follow Up Plan: Additional outreach attempts will be made to reach the patient to complete the Transitions of Care (Post Inpatient/ED visit) call.   Mliss Creed [facility], BSN RN Care Manager/ Transition of Care [place]/ [facility] [phone]

## 2023-09-14 ENCOUNTER — Other Ambulatory Visit (HOSPITAL_BASED_OUTPATIENT_CLINIC_OR_DEPARTMENT_OTHER): Payer: Self-pay

## 2023-09-14 ENCOUNTER — Telehealth: Payer: Self-pay

## 2023-09-14 LAB — SURGICAL PATHOLOGY

## 2023-09-14 NOTE — Transitions of Care (Post Inpatient/ED Visit) (Signed)
   09/14/2023  Name: Nicole Jimenez MRN: [id number] DOB: Jun 06, 1934  Today's TOC FU Call Status: Today's TOC FU Call Status:: Unsuccessful Call (2nd Attempt) Unsuccessful Call (2nd Attempt) Date: 09/14/23  Attempted to reach the patient regarding the most recent Inpatient/ED visit.  Follow Up Plan: Additional outreach attempts will be made to reach the patient to complete the Transitions of Care (Post Inpatient/ED visit) call.   Shahana Capes J. Lindwood Mogel RN, MSN [facility], [facility] RN Care Manager Direct Dial: [phone]  Fax: [phone] Website: [email or web address]

## 2023-09-15 ENCOUNTER — Telehealth: Payer: Self-pay

## 2023-09-15 NOTE — Transitions of Care (Post Inpatient/ED Visit) (Signed)
   09/15/2023  Name: Nicole Jimenez MRN: [id number] DOB: 12/09/1934  Today's TOC FU Call Status: Today's TOC FU Call Status:: Unsuccessful Call (3rd Attempt) Unsuccessful Call (3rd Attempt) Date: 09/15/23  Attempted to reach the patient regarding the most recent Inpatient/ED visit.  Follow Up Plan: No further outreach attempts will be made at this time. We have been unable to contact the patient.  Dayna Alia J. Adryel Wortmann RN, MSN [facility], [facility] RN Care Manager Direct Dial: [phone]  Fax: [phone] Website: [email or web address]

## 2023-09-17 DIAGNOSIS — Z48815 Encounter for surgical aftercare following surgery on the digestive system: Secondary | ICD-10-CM | POA: Diagnosis not present

## 2023-09-17 DIAGNOSIS — I130 Hypertensive heart and chronic kidney disease with heart failure and stage 1 through stage 4 chronic kidney disease, or unspecified chronic kidney disease: Secondary | ICD-10-CM | POA: Diagnosis not present

## 2023-09-19 ENCOUNTER — Encounter: Payer: Self-pay | Admitting: Family Medicine

## 2023-09-19 ENCOUNTER — Ambulatory Visit: Admitting: Family Medicine

## 2023-09-19 VITALS — BP 118/73 | HR 57 | Temp 97.7°F | Ht 61.0 in | Wt 126.2 lb

## 2023-09-19 DIAGNOSIS — Z9049 Acquired absence of other specified parts of digestive tract: Secondary | ICD-10-CM

## 2023-09-19 DIAGNOSIS — K8050 Calculus of bile duct without cholangitis or cholecystitis without obstruction: Secondary | ICD-10-CM

## 2023-09-19 DIAGNOSIS — K851 Biliary acute pancreatitis without necrosis or infection: Secondary | ICD-10-CM | POA: Diagnosis not present

## 2023-09-19 DIAGNOSIS — H6121 Impacted cerumen, right ear: Secondary | ICD-10-CM

## 2023-09-19 LAB — CBC WITH DIFFERENTIAL/PLATELET
Basophils Absolute: 0 K/uL (ref 0.0–0.1)
Basophils Relative: 0.5 % (ref 0.0–3.0)
Eosinophils Absolute: 0.2 K/uL (ref 0.0–0.7)
Eosinophils Relative: 2.6 % (ref 0.0–5.0)
HCT: 40.8 % (ref 36.0–46.0)
Hemoglobin: 13.3 g/dL (ref 12.0–15.0)
Lymphocytes Relative: 11.4 % — ABNORMAL LOW (ref 12.0–46.0)
Lymphs Abs: 0.9 K/uL (ref 0.7–4.0)
MCHC: 32.7 g/dL (ref 30.0–36.0)
MCV: 88.3 fl (ref 78.0–100.0)
Monocytes Absolute: 0.5 K/uL (ref 0.1–1.0)
Monocytes Relative: 7 % (ref 3.0–12.0)
Neutro Abs: 5.9 K/uL (ref 1.4–7.7)
Neutrophils Relative %: 78.5 % — ABNORMAL HIGH (ref 43.0–77.0)
Platelets: 173 K/uL (ref 150.0–400.0)
RBC: 4.62 Mil/uL (ref 3.87–5.11)
RDW: 15.4 % (ref 11.5–15.5)
WBC: 7.5 K/uL (ref 4.0–10.5)

## 2023-09-19 LAB — COMPREHENSIVE METABOLIC PANEL WITH GFR
ALT: 42 U/L — ABNORMAL HIGH (ref 0–35)
AST: 28 U/L (ref 0–37)
Albumin: 3.7 g/dL (ref 3.5–5.2)
Alkaline Phosphatase: 141 U/L — ABNORMAL HIGH (ref 39–117)
BUN: 22 mg/dL (ref 6–23)
CO2: 31 meq/L (ref 19–32)
Calcium: 9.5 mg/dL (ref 8.4–10.5)
Chloride: 102 meq/L (ref 96–112)
Creatinine, Ser: 1.22 mg/dL — ABNORMAL HIGH (ref 0.40–1.20)
GFR: 40.11 mL/min — ABNORMAL LOW (ref >60.00)
Glucose, Bld: 167 mg/dL — ABNORMAL HIGH (ref 70–99)
Potassium: 4.6 meq/L (ref 3.5–5.1)
Sodium: 139 meq/L (ref 135–145)
Total Bilirubin: 1.5 mg/dL — ABNORMAL HIGH (ref 0.2–1.2)
Total Protein: 7.2 g/dL (ref 6.0–8.3)

## 2023-09-19 NOTE — Progress Notes (Signed)
 Subjective  CC:  Chief Complaint  Patient presents with   Hospitalization Follow-up    09/07/2023 - 09/12/2023 (5 days) [facility]  Acute pancreatitis    HPI: Nicole Jimenez is a 89 y.o. female who presents to the office today to address the problems listed above in the chief complaint. I reviewed all hospital notes and records, surgery report, labs and discharge summary.  To briefly summarize: In the ER patient's labs show elevated lipase with mildly elevated AST ALT and total bilirubin of 1.4.  CT abdomen pelvis shows cholelithiasis and ultrasound abdomen shows which is concerning for choledocholithiasis.  Gallbladder thickening was present but no Murphy sign.  Patient was admitted to hospital service, GI and general surgery consulted.  Details below.   Acute gallstone pancreatitis/acute choledocholithiasis/dilated CBD/elevated LFTs/possible acute cholecystitis, POA: Seen by GI as well as general surgery.  Due to rising LFTs and bilirubin, MRCP completed which raised concern for choledocholithiasis, patient ended up having ERCP by Dr. Wilhelmenia on 09/09/2023, biliary sludge removed with sphincteroplasty and sphincterotomy performed. Evidence of cholelithiasis on cholangiogram with filling of the gallbladder.  Bilirubin and LFTs continue to improve.  She then underwent laparoscopic cholecystectomy by Dr. Ebbie 09/11/2023.  Doing very well postoperatively, tolerating diet, very mild and expected postoperative pain.  Cleared by general surgery for discharge.  Prescribed her some Percocet at discharge.  Postoperatively she is recovering well.  Complains of some itching around the incisions but no bleeding or significant abdominal pain.  Her appetite is improving.  She is tolerating p.o.  Having normal bowel movements.  No fevers or chills.  She does complain of some fullness in the right ear and would like that checked.  She is due for follow-up blood work.  Liver enzymes were  trending downward at time of discharge. Lab Results  Component Value Date   ALT 148 (H) 09/12/2023   AST 67 (H) 09/12/2023   ALKPHOS 166 (H) 09/12/2023   BILITOT 1.6 (H) 09/12/2023   Lab Results  Component Value Date   HAV Reactive (A) 09/10/2023   HEPAIGM NON REACTIVE 09/10/2023   HEPBIGM NON REACTIVE 09/10/2023      Latest Ref Rng & Units 09/10/2023    9:32 AM  Hepatitis  Hep B Surface Ag NON REACTIVE NON REACTIVE   Hep B IgM NON REACTIVE NON REACTIVE   Hep C Ab NON REACTIVE NON REACTIVE   Hep A IgM NON REACTIVE NON REACTIVE    Lab Results  Component Value Date   LIPASE 35 09/09/2023   Lab Results  Component Value Date   NA 136 09/12/2023   CL 103 09/12/2023   K 4.1 09/12/2023   CO2 24 09/12/2023   BUN 26 (H) 09/12/2023   CREATININE 1.31 (H) 09/12/2023   GFRNONAA 40 (L) 09/12/2023   CALCIUM  8.8 (L) 09/12/2023   PHOS 3.6 12/24/2022   ALBUMIN 2.6 (L) 09/12/2023   GLUCOSE 239 (H) 09/12/2023   Lab Results  Component Value Date   WBC 9.2 09/12/2023   HGB 12.9 09/12/2023   HCT 40.3 09/12/2023   MCV 90.2 09/12/2023   PLT 95 (L) 09/12/2023    Assessment  1. Gallstone pancreatitis   2. S/P laparoscopic cholecystectomy   3. Choledocholithiasis   4. Hearing loss of right ear due to cerumen impaction      Plan  Patient is recovering well from cholecystectomy and ERCP.  No longer on pain medications.  Routine follow-up recommended.  Recommend follow-up with surgery for  post operative care just once.  Will recheck renal function, liver test and blood count today. Cerumen impaction cleared with irrigation  Follow up: As scheduled 10/24/2023  Orders Placed This Encounter  Procedures   CBC with Differential/Platelet   Comprehensive metabolic panel with GFR   No orders of the defined types were placed in this encounter.     I reviewed the patients updated PMH, FH, and SocHx.    Patient Active Problem List   Diagnosis Date Noted   Chronic kidney disease, stage  3b (HCC) 11/02/2022    Priority: High   History of arterial embolism 08/21/2018    Priority: High   Anticoagulant long-term use 03/06/2018    Priority: High   Permanent atrial fibrillation (HCC) 01/23/2018    Priority: High   Chronic diastolic CHF (congestive heart failure) (HCC) 03/03/2017    Priority: High   Type 2 diabetes mellitus with peripheral neuropathy (HCC) 01/24/2017    Priority: High   Subclinical hyperthyroidism 01/21/2014    Priority: High   Essential hypertension 02/23/2013    Priority: High   Combined hyperlipidemia associated with type 2 diabetes mellitus (HCC) 02/23/2013    Priority: High   Major depression, recurrent, chronic (HCC) 07/18/2012    Priority: High   Thrombocytopenia (HCC) 11/02/2022    Priority: Medium    Gout 11/01/2022    Priority: Medium    DJD of right shoulder 01/24/2017    Priority: Medium    Bereavement due to life event 03/05/2016    Priority: Medium    Osteoporosis of femur without pathological fracture 08/23/2015    Priority: Medium    Idiopathic scoliosis of thoracic spine 07/09/2014    Priority: Medium    Sjogren's syndrome (HCC) 08/04/2012    Priority: Medium    DDD (degenerative disc disease), lumbosacral 07/15/2012    Priority: Medium    Lumbosacral spondylosis 07/15/2012    Priority: Medium    Osteoarthritis of knee 07/15/2012    Priority: Medium    Bilateral edema of lower extremity 01/26/2017    Priority: Low   Bilateral hip bursitis 07/17/2015    Priority: Low   Age-related nuclear cataract of both eyes 03/11/2015    Priority: Low   Insomnia 09/04/2013    Priority: Low   Allergic rhinitis 07/15/2012    Priority: Low   Gastritis without bleeding 09/09/2023   Obstructive jaundice 09/09/2023   Abnormal LFTs 09/09/2023   Gallstone pancreatitis 09/09/2023   Acute pancreatitis 09/07/2023   Current Meds  Medication Sig   acetaminophen  (TYLENOL ) 500 MG tablet Take 2 tablets (1,000 mg total) by mouth every 6 (six)  hours as needed.   apixaban  (ELIQUIS ) 2.5 MG TABS tablet Take 1 tablet (2.5 mg total) by mouth 2 (two) times daily.   Calcium  Carb-Cholecalciferol 600-800 MG-UNIT CHEW Chew 2 each by mouth daily.   carvedilol  (COREG ) 12.5 MG tablet Take 1 tablet (12.5 mg total) by mouth 2 (two) times daily.   cycloSPORINE (RESTASIS) 0.05 % ophthalmic emulsion Place 1 drop into both eyes 2 (two) times daily.   diclofenac  sodium (VOLTAREN ) 1 % GEL APPLY 2 G TOPICALLY 4 (FOUR) TIMES DAILY AS NEEDED. **PA DENIED** (Patient taking differently: Apply 1 Application topically daily as needed (For pain).)   diltiazem  (CARDIZEM  CD) 180 MG 24 hr capsule TAKE 1 CAPSULE (180 MG TOTAL) BY MOUTH EVERY MORNING   empagliflozin  (JARDIANCE ) 10 MG TABS tablet Take 1 tablet (10 mg total) by mouth daily.   Glucosamine-MSM-Hyaluronic Acd (JOINT HEALTH PO) Take  2 tablets by mouth daily.   glucose blood (ONETOUCH ULTRA) test strip USE TWICE DAILY TO CHECK BLOOD SUGAR - E11.40   Krill Oil 500 MG CAPS Take 500 mg by mouth daily.   Multiple Vitamins-Minerals (MULTIVITAMIN ADULT PO) Take 1 tablet by mouth daily.   omeprazole  (PRILOSEC) 20 MG capsule Take 20 mg by mouth daily.   pantoprazole  (PROTONIX ) 40 MG tablet Take 1 tablet (40 mg total) by mouth daily.   rosuvastatin  (CRESTOR ) 40 MG tablet Take 1 tablet (40 mg total) by mouth daily.   vortioxetine  HBr (TRINTELLIX ) 10 MG TABS tablet Take 1 tablet (10 mg total) by mouth daily.    Allergies: Patient is allergic to amoxicillin-pot clavulanate, amoxicillin, and clarithromycin. Family History: Patient family history includes Arthritis in her mother and sister; Diabetes in her father and sister; Healthy in her daughter, son, and son; Heart attack in her father and mother; Hyperlipidemia in her father; Other in her son. Social History:  Patient  reports that she has never smoked. She has never used smokeless tobacco. She reports that she does not drink alcohol and does not use  drugs.  Review of Systems: Constitutional: Negative for fever malaise or anorexia Cardiovascular: negative for chest pain Respiratory: negative for SOB or persistent cough Gastrointestinal: negative for abdominal pain  Objective  Vitals: BP 118/73   Pulse (!) 57   Temp 97.7 F (36.5 C)   Ht 5' 1 (1.549 m)   Wt 126 lb 3.2 oz (57.2 kg)   SpO2 95%   BMI 23.85 kg/m  General: no acute distress , A&Ox3, tearful at times but looks good overall. HEENT: PEERL, conjunctiva normal, neck is supple Cardiovascular:  RRR without murmur or gallop.  Respiratory:  Good breath sounds bilaterally, CTAB with normal respiratory effort Abdomen: Soft, normal bowel sounds.  Laparoscopic incisions with Steri-Strips in place and dry.  Nontender Skin:  Warm, no rashes  Commons side effects, risks, benefits, and alternatives for medications and treatment plan prescribed today were discussed, and the patient expressed understanding of the given instructions. Patient is instructed to call or message via MyChart if he/she has any questions or concerns regarding our treatment plan. No barriers to understanding were identified. We discussed Red Flag symptoms and signs in detail. Patient expressed understanding regarding what to do in case of urgent or emergency type symptoms.  Medication list was reconciled, printed and provided to the patient in AVS. Patient instructions and summary information was reviewed with the patient as documented in the AVS. This note was prepared with assistance of Dragon voice recognition software. Occasional wrong-word or sound-a-like substitutions may have occurred due to the inherent limitations of voice recognition software

## 2023-09-19 NOTE — Patient Instructions (Signed)
 Please follow up as scheduled for your next visit with me: 10/24/2023   If you have any questions or concerns, please don't hesitate to send me a message via MyChart or call the office at [phone]. Thank you for visiting with us  today! It's our pleasure caring for you.   Discharge Instructions by Ebbie Cough, MD at 09/11/2023  8:37 AM  Author: Ebbie Cough, MD Service: -- Author Type: Physician  Filed: 09/11/2023  8:37 AM Date of Service: 09/11/2023  8:37 AM Status: Written  Editor: Ebbie Cough, MD (Physician)                                  [facility] LAPAROSCOPIC SURGERY: POST OP INSTRUCTIONS   Always review your discharge instruction sheet given to you by the facility where your surgery was performed. IF YOU HAVE DISABILITY OR FAMILY LEAVE FORMS, YOU MUST BRING THEM TO THE OFFICE FOR PROCESSING.   DO NOT GIVE THEM TO YOUR DOCTOR.   A prescription for pain medication may be given to you upon discharge.  Take your pain medication as prescribed, if needed.  If narcotic pain medicine is not needed, then you may take acetaminophen  (Tylenol ), naprosyn (Alleve), or ibuprofen (Advil) as needed. Take your usually prescribed medications unless otherwise directed. If you need a refill on your pain medication, please contact your pharmacy.  They will contact our office to request authorization. Prescriptions will not be filled after 5pm or on week-ends. You should follow a light diet the first few days after arrival home, such as soup and crackers, etc.  Be sure to include lots of fluids daily. Most patients will experience some swelling and bruising in the area of the incisions.  Ice packs will help.  Swelling and bruising can take several days to resolve.  It is common to experience some constipation if taking pain medication after surgery.  Increasing fluid intake and taking a stool softener (such as Colace) will usually help or prevent this problem from  occurring.  A mild laxative (Milk of Magnesia or Miralax ) should be taken according to package instructions if there are no bowel movements after 48 hours. Unless discharge instructions indicate otherwise, you may remove your bandages 48 hours after surgery, and you may shower at that time.  You may have steri-strips (small skin tapes) in place directly over the incision.  These strips should be left on the skin for 7-10 days.  If your surgeon used skin glue on the incision, you may shower in 24 hours.  The glue will flake off over the next 2-3 weeks.  Any sutures or staples will be removed at the office during your follow-up visit. ACTIVITIES:  You may resume regular (light) daily activities beginning the next day--such as daily self-care, walking, climbing stairs--gradually increasing activities as tolerated.  You may have sexual intercourse when it is comfortable.  Refrain from any heavy lifting or straining until approved by your doctor. You may drive when you are no longer taking prescription pain medication, you can comfortably wear a seatbelt, and you can safely maneuver your car and apply brakes. RETURN TO WORK:  __________________________________________________________ Rosine should see your doctor in the office for a follow-up appointment approximately 2-3 weeks after your surgery.  Make sure that you call for this appointment within a day or two after you arrive home to insure a convenient appointment time. OTHER INSTRUCTIONS: __________________________________________________________________________________________________________________________ __________________________________________________________________________________________________________________________ WHEN TO  CALL YOUR DOCTOR: Fever over 101.0 Inability to urinate Continued bleeding from incision. Increased pain, redness, or drainage from the incision. Increasing abdominal pain   The clinic staff is available to answer your  questions during regular business hours.  Please don't hesitate to call and ask to speak to one of the nurses for clinical concerns.  If you have a medical emergency, go to the nearest emergency room or call 911.  A surgeon from [facility] is always on call at the hospital. [address], [address], [place], [place]  [address] ? [address] [address], [place], [place]   [address] [phone] ? [phone] ? FAX [phone] Web site: [email or web address]

## 2023-09-20 DIAGNOSIS — Z48815 Encounter for surgical aftercare following surgery on the digestive system: Secondary | ICD-10-CM | POA: Diagnosis not present

## 2023-09-20 DIAGNOSIS — I130 Hypertensive heart and chronic kidney disease with heart failure and stage 1 through stage 4 chronic kidney disease, or unspecified chronic kidney disease: Secondary | ICD-10-CM | POA: Diagnosis not present

## 2023-09-22 DIAGNOSIS — I130 Hypertensive heart and chronic kidney disease with heart failure and stage 1 through stage 4 chronic kidney disease, or unspecified chronic kidney disease: Secondary | ICD-10-CM | POA: Diagnosis not present

## 2023-09-22 DIAGNOSIS — Z48815 Encounter for surgical aftercare following surgery on the digestive system: Secondary | ICD-10-CM | POA: Diagnosis not present

## 2023-09-25 ENCOUNTER — Ambulatory Visit: Payer: Self-pay | Admitting: Family Medicine

## 2023-09-25 NOTE — Progress Notes (Signed)
 See mychart note

## 2023-09-26 ENCOUNTER — Telehealth: Payer: Self-pay | Admitting: Family Medicine

## 2023-09-26 NOTE — Telephone Encounter (Signed)
 [facility] faxed document Home Health Certificate (Order [place] [id number]), to be filled out by provider. Patient requested to send it back via Fax within ASAP. Document is located in providers tray at front office.Please advise at [phone].

## 2023-09-27 DIAGNOSIS — Z48815 Encounter for surgical aftercare following surgery on the digestive system: Secondary | ICD-10-CM | POA: Diagnosis not present

## 2023-09-27 DIAGNOSIS — I130 Hypertensive heart and chronic kidney disease with heart failure and stage 1 through stage 4 chronic kidney disease, or unspecified chronic kidney disease: Secondary | ICD-10-CM | POA: Diagnosis not present

## 2023-09-28 NOTE — Telephone Encounter (Signed)
 Form has been placed in providers box

## 2023-09-29 DIAGNOSIS — Z48815 Encounter for surgical aftercare following surgery on the digestive system: Secondary | ICD-10-CM | POA: Diagnosis not present

## 2023-09-29 DIAGNOSIS — I130 Hypertensive heart and chronic kidney disease with heart failure and stage 1 through stage 4 chronic kidney disease, or unspecified chronic kidney disease: Secondary | ICD-10-CM | POA: Diagnosis not present

## 2023-10-03 DIAGNOSIS — D696 Thrombocytopenia, unspecified: Secondary | ICD-10-CM | POA: Diagnosis not present

## 2023-10-03 DIAGNOSIS — Z48815 Encounter for surgical aftercare following surgery on the digestive system: Secondary | ICD-10-CM | POA: Diagnosis not present

## 2023-10-03 DIAGNOSIS — K2970 Gastritis, unspecified, without bleeding: Secondary | ICD-10-CM | POA: Diagnosis not present

## 2023-10-03 DIAGNOSIS — N184 Chronic kidney disease, stage 4 (severe): Secondary | ICD-10-CM | POA: Diagnosis not present

## 2023-10-03 DIAGNOSIS — I4821 Permanent atrial fibrillation: Secondary | ICD-10-CM | POA: Diagnosis not present

## 2023-10-03 DIAGNOSIS — E1142 Type 2 diabetes mellitus with diabetic polyneuropathy: Secondary | ICD-10-CM | POA: Diagnosis not present

## 2023-10-03 DIAGNOSIS — I130 Hypertensive heart and chronic kidney disease with heart failure and stage 1 through stage 4 chronic kidney disease, or unspecified chronic kidney disease: Secondary | ICD-10-CM | POA: Diagnosis not present

## 2023-10-03 DIAGNOSIS — E1151 Type 2 diabetes mellitus with diabetic peripheral angiopathy without gangrene: Secondary | ICD-10-CM | POA: Diagnosis not present

## 2023-10-03 DIAGNOSIS — E785 Hyperlipidemia, unspecified: Secondary | ICD-10-CM | POA: Diagnosis not present

## 2023-10-03 DIAGNOSIS — E1122 Type 2 diabetes mellitus with diabetic chronic kidney disease: Secondary | ICD-10-CM | POA: Diagnosis not present

## 2023-10-03 DIAGNOSIS — I5032 Chronic diastolic (congestive) heart failure: Secondary | ICD-10-CM | POA: Diagnosis not present

## 2023-10-04 DIAGNOSIS — Z48815 Encounter for surgical aftercare following surgery on the digestive system: Secondary | ICD-10-CM | POA: Diagnosis not present

## 2023-10-04 DIAGNOSIS — I130 Hypertensive heart and chronic kidney disease with heart failure and stage 1 through stage 4 chronic kidney disease, or unspecified chronic kidney disease: Secondary | ICD-10-CM | POA: Diagnosis not present

## 2023-10-06 DIAGNOSIS — I130 Hypertensive heart and chronic kidney disease with heart failure and stage 1 through stage 4 chronic kidney disease, or unspecified chronic kidney disease: Secondary | ICD-10-CM | POA: Diagnosis not present

## 2023-10-06 DIAGNOSIS — Z48815 Encounter for surgical aftercare following surgery on the digestive system: Secondary | ICD-10-CM | POA: Diagnosis not present

## 2023-10-11 DIAGNOSIS — I130 Hypertensive heart and chronic kidney disease with heart failure and stage 1 through stage 4 chronic kidney disease, or unspecified chronic kidney disease: Secondary | ICD-10-CM | POA: Diagnosis not present

## 2023-10-11 DIAGNOSIS — Z48815 Encounter for surgical aftercare following surgery on the digestive system: Secondary | ICD-10-CM | POA: Diagnosis not present

## 2023-10-13 DIAGNOSIS — Z48815 Encounter for surgical aftercare following surgery on the digestive system: Secondary | ICD-10-CM | POA: Diagnosis not present

## 2023-10-13 DIAGNOSIS — I130 Hypertensive heart and chronic kidney disease with heart failure and stage 1 through stage 4 chronic kidney disease, or unspecified chronic kidney disease: Secondary | ICD-10-CM | POA: Diagnosis not present

## 2023-10-17 ENCOUNTER — Encounter: Payer: Self-pay | Admitting: Family Medicine

## 2023-10-17 DIAGNOSIS — Z48815 Encounter for surgical aftercare following surgery on the digestive system: Secondary | ICD-10-CM | POA: Diagnosis not present

## 2023-10-17 DIAGNOSIS — I130 Hypertensive heart and chronic kidney disease with heart failure and stage 1 through stage 4 chronic kidney disease, or unspecified chronic kidney disease: Secondary | ICD-10-CM | POA: Diagnosis not present

## 2023-10-20 ENCOUNTER — Ambulatory Visit: Admitting: Family Medicine

## 2023-10-20 DIAGNOSIS — I130 Hypertensive heart and chronic kidney disease with heart failure and stage 1 through stage 4 chronic kidney disease, or unspecified chronic kidney disease: Secondary | ICD-10-CM | POA: Diagnosis not present

## 2023-10-20 DIAGNOSIS — Z48815 Encounter for surgical aftercare following surgery on the digestive system: Secondary | ICD-10-CM | POA: Diagnosis not present

## 2023-10-24 ENCOUNTER — Encounter: Payer: Self-pay | Admitting: Family Medicine

## 2023-10-24 ENCOUNTER — Ambulatory Visit: Admitting: Family Medicine

## 2023-10-24 VITALS — BP 136/65 | HR 66 | Temp 97.7°F | Ht 61.0 in | Wt 130.6 lb

## 2023-10-24 DIAGNOSIS — E1142 Type 2 diabetes mellitus with diabetic polyneuropathy: Secondary | ICD-10-CM | POA: Diagnosis not present

## 2023-10-24 DIAGNOSIS — I4821 Permanent atrial fibrillation: Secondary | ICD-10-CM

## 2023-10-24 DIAGNOSIS — Z23 Encounter for immunization: Secondary | ICD-10-CM

## 2023-10-24 DIAGNOSIS — Z9049 Acquired absence of other specified parts of digestive tract: Secondary | ICD-10-CM

## 2023-10-24 DIAGNOSIS — K2940 Chronic atrophic gastritis without bleeding: Secondary | ICD-10-CM | POA: Diagnosis not present

## 2023-10-24 DIAGNOSIS — N1832 Chronic kidney disease, stage 3b: Secondary | ICD-10-CM

## 2023-10-24 DIAGNOSIS — I10 Essential (primary) hypertension: Secondary | ICD-10-CM | POA: Diagnosis not present

## 2023-10-24 DIAGNOSIS — I5032 Chronic diastolic (congestive) heart failure: Secondary | ICD-10-CM

## 2023-10-24 LAB — POCT GLYCOSYLATED HEMOGLOBIN (HGB A1C): Hemoglobin A1C: 6.3 % — AB (ref 4.0–5.6)

## 2023-10-24 MED ORDER — PANTOPRAZOLE SODIUM 20 MG PO TBEC: 20.0000 mg | DELAYED_RELEASE_TABLET | Freq: Every day | ORAL | 0 refills | Status: DC

## 2023-10-24 NOTE — Progress Notes (Signed)
 Subjective  CC: No chief complaint on file.   HPI: Nicole Jimenez is a 89 y.o. female who presents to the office today for follow up of diabetes and problems listed above in the chief complaint.  Follow-up after gallbladder surgery, 2-month follow-up.  See last note.  Fortunately she is doing well.  She is starting to regain some of her weight back.  Appetite is improved.  No longer with any abdominal pain.  Has received home physical therapy and strength has improved.  She is to be discharged this week from that.  Still feeling a little apprehensive about getting back to her full routine and activity.  She just started driving in the last few weeks.  This is more an emotional apprehension and physical.  No physical symptoms at this time. Diabetes follow up: Her diabetic control is reported as Unchanged.  Doing fine with medications.  She denies exertional CP or SOB or symptomatic hypoglycemia. She denies foot sores or paresthesias.  Blood pressure remains controlled on medications. No chest pain or shortness of breath. Had been on omeprazole  which was changed to Protonix  in the hospital.  On Eliquis .  Minimal reflux symptoms on medications.  Wt Readings from Last 3 Encounters:  10/24/23 130 lb 9.6 oz (59.2 kg)  09/19/23 126 lb 3.2 oz (57.2 kg)  09/09/23 128 lb (58.1 kg)    BP Readings from Last 3 Encounters:  10/24/23 136/65  09/19/23 118/73  09/12/23 (!) 146/80    Assessment  1. Type 2 diabetes mellitus with peripheral neuropathy (HCC)   2. Essential hypertension   3. Chronic diastolic CHF (congestive heart failure) (HCC)   4. Chronic kidney disease, stage 3b (HCC)   5. Permanent atrial fibrillation (HCC)   6. S/P laparoscopic cholecystectomy   7. Need for influenza vaccination   8. Atrophic gastritis without hemorrhage      Plan  Diabetes is currently very well controlled.  Continue current medications.  Recommend high-protein high veggie diet.  Want to regain a little  bit more weight back.  Flu shot updated today. Other chronic conditions are stable.  No new physical symptoms. Status post laparoscopic cholecystectomy after gallstone pancreatitis.  Reassured.  She has recovered. Refill pantoprazole  20 mg daily to use for the next 90 days.  Stop omeprazole .  Follow up: 3 months for recheck Orders Placed This Encounter  Procedures   Flu vaccine HIGH DOSE PF(Fluzone Trivalent)   POCT HgB A1C   Meds ordered this encounter  Medications   pantoprazole  (PROTONIX ) 20 MG tablet    Sig: Take 1 tablet (20 mg total) by mouth daily.    Dispense:  90 tablet    Refill:  0      Immunization History  Administered Date(s) Administered   Fluad Quad(high Dose 65+) 11/20/2018, 11/28/2020, 10/23/2021   Fluad Trivalent(High Dose 65+) 11/01/2022   INFLUENZA, HIGH DOSE SEASONAL PF 12/02/2013, 01/20/2015, 12/28/2016, 10/26/2019, 10/24/2023   Influenza Split 01/24/2012   Influenza,inj,Quad PF,6+ Mos 11/21/2015, 11/18/2017   Influenza-Unspecified 01/24/2012, 01/05/2014, 01/05/2014, 01/20/2015   PFIZER Comirnaty(Gray Top)Covid-19 Tri-Sucrose Vaccine 05/20/2020   PFIZER(Purple Top)SARS-COV-2 Vaccination 03/01/2019, 03/22/2019, 11/16/2019   Pfizer Covid-19 Vaccine Bivalent Booster 72yrs & up 12/16/2020   Pfizer(Comirnaty)Fall Seasonal Vaccine 12 years and older 11/29/2021   Pneumococcal Conjugate-13 03/06/2015, 03/06/2015   Pneumococcal Polysaccharide-23 06/18/2016   Respiratory Syncytial Virus Vaccine,Recomb Aduvanted(Arexvy) 01/17/2023   Zoster Recombinant(Shingrix) 03/09/2018, 08/21/2018   Zoster, Live 10/09/2012    Diabetes Related Lab Review: Lab Results  Component Value Date  HGBA1C 6.3 (A) 10/24/2023   HGBA1C 6.5 (A) 07/19/2023   HGBA1C 7.0 (A) 04/21/2023    Lab Results  Component Value Date   MICROALBUR 29.0 (H) 04/21/2023   Lab Results  Component Value Date   CREATININE 1.22 (H) 09/19/2023   BUN 22 09/19/2023   NA 139 09/19/2023   K 4.6 09/19/2023    CL 102 09/19/2023   CO2 31 09/19/2023   Lab Results  Component Value Date   CHOL 136 11/01/2022   CHOL 176 10/23/2021   CHOL 177 04/17/2021   Lab Results  Component Value Date   HDL 65.70 11/01/2022   HDL 71.50 10/23/2021   HDL 73.10 04/17/2021   Lab Results  Component Value Date   LDLCALC 51 11/01/2022   LDLCALC 84 10/23/2021   LDLCALC 80 04/17/2021   Lab Results  Component Value Date   TRIG 38 09/07/2023   TRIG 97.0 11/01/2022   TRIG 106.0 10/23/2021   Lab Results  Component Value Date   CHOLHDL 2 11/01/2022   CHOLHDL 2 10/23/2021   CHOLHDL 2 04/17/2021   No results found for: LDLDIRECT The ASCVD Risk score (Arnett DK, et al., 2019) failed to calculate for the following reasons:   The 2019 ASCVD risk score is only valid for ages 46 to 64 I have reviewed the PMH, Fam and Soc history. Patient Active Problem List   Diagnosis Date Noted   Chronic kidney disease, stage 3b (HCC) 11/02/2022    Priority: High    Had mild ckd, likely HTN/DM related. Then Had ATN 04/2022 w/ highest creatinine 4+; never returned completely back to baseline; GFR aroudn 30 is new baseline 10/2022 05/2023: renal consult due to proteinuria: can increase jardiance  to 25, no other changes needed. Low risk of progression but will follow. Dr. Prescilla    History of arterial embolism 08/21/2018    Priority: High    Managed by vascular. 2018    Anticoagulant long-term use 03/06/2018    Priority: High   Permanent atrial fibrillation (HCC) 01/23/2018    Priority: High   Chronic diastolic CHF (congestive heart failure) (HCC) 03/03/2017    Priority: High    Echo 11/2016, nl EF but LVH and diastolic dysfunction     Type 2 diabetes mellitus with peripheral neuropathy (HCC) 01/24/2017    Priority: High    Had been on metformin ; stopped in march 2024 during hospitalization for AKI on ACE.  H/o pancreatitis: avoiding glp-1 and cautious if sglt2-I started jardiance  10/24    Subclinical  hyperthyroidism 01/21/2014    Priority: High    Managed by endocrine    Essential hypertension 02/23/2013    Priority: High    improved on recheck. Previously, lisinopril  and HCTZ stopped 04/2022 during admission for AKI/ATN. HCTZ has been restarted -BP improved with change from atenolol  to carvedilol  -if additional BP control needed at follow up, could change diltiazem  to amlodipine and monitor HR -stressed salt avoidance. She is working on activity     Combined hyperlipidemia associated with type 2 diabetes mellitus (HCC) 02/23/2013    Priority: High   Major depression, recurrent, chronic (HCC) 07/18/2012    Priority: High   Thrombocytopenia (HCC) 11/02/2022    Priority: Medium     First noted March 2024 during hospitalization for AKI and pancreatitis.  Improved posthospitalization.  September 2024, platelets 121.  Unclear cause, needs workup if persist.    Gout 11/01/2022    Priority: Medium    DJD of right shoulder 01/24/2017  Priority: Medium    Bereavement due to life event 03/05/2016    Priority: Medium     Her husband of 56 years died in 02/10/16    Osteoporosis of femur without pathological fracture 08/23/2015    Priority: Medium     DEXA 11/2022: lowest T -=-2.3 FRAX score: 10 year major osteoporotic risk: 16%. 10 year hip fracture risk: 4.9%. The thresholds for treatment are 20% and 3%, respectively. She will consider fosamax  but defer decision until 2025.  DEXA 11/2020: lowest T =-2.3, left femoral neck, elevated FRAX score Major Osteoporotic Fracture: 29.4% Hip Fracture:                19.1% Recommended fosamax  but pt never started.   dexa 2017: T = -1.0 at femur, elevated FRAX score.  Repeat 2019 Osteopenia with elevated hip frac score of 10%:  T lowest = - 1.8 Major Osteoporotic Fracture: 17.1% Hip Fracture:                10.4% Population:                  [place]  ([place]) Risk Factors:                Family Hist. (Parent hip fracture) Offer medications due to  elevated hip fracture risk score.     Idiopathic scoliosis of thoracic spine 07/09/2014    Priority: Medium    Sjogren's syndrome (HCC) 08/04/2012    Priority: Medium    DDD (degenerative disc disease), lumbosacral 07/15/2012    Priority: Medium    Lumbosacral spondylosis 07/15/2012    Priority: Medium    Osteoarthritis of knee 07/15/2012    Priority: Medium    Bilateral edema of lower extremity 01/26/2017    Priority: Low   Bilateral hip bursitis 07/17/2015    Priority: Low   Age-related nuclear cataract of both eyes 03/11/2015    Priority: Low   Insomnia 09/04/2013    Priority: Low   Allergic rhinitis 07/15/2012    Priority: Low   Gastritis without bleeding 09/09/2023   Obstructive jaundice 09/09/2023   Abnormal LFTs 09/09/2023   Gallstone pancreatitis 09/09/2023   Acute pancreatitis 09/07/2023    Social History: Patient  reports that she has never smoked. She has never used smokeless tobacco. She reports that she does not drink alcohol and does not use drugs.  Review of Systems: Ophthalmic: negative for eye pain, loss of vision or double vision Cardiovascular: negative for chest pain Respiratory: negative for SOB or persistent cough Gastrointestinal: negative for abdominal pain Genitourinary: negative for dysuria or gross hematuria MSK: negative for foot lesions Neurologic: negative for weakness or gait disturbance  Objective  Vitals: BP 136/65   Pulse 66   Temp 97.7 F (36.5 C)   Ht 5' 1 (1.549 m)   Wt 130 lb 9.6 oz (59.2 kg)   SpO2 99%   BMI 24.68 kg/m  General: well appearing, no acute distress, appears well, thinner Psych:  Alert and oriented, normal mood and affect HEENT:  Normocephalic, atraumatic, moist mucous membranes, supple neck  Cardiovascular:  Nl S1 and S2, RRR without murmur, gallop or rub. no edema Respiratory:  Good breath sounds bilaterally, CTAB with normal effort, no rales Gastrointestinal: normal BS, soft, nontender   Diabetic  education: ongoing education regarding chronic disease management for diabetes was given today. We continue to reinforce the ABC's of diabetic management: A1c (<7 or 8 dependent upon patient), tight blood pressure control, and cholesterol  management with goal LDL < 100 minimally. We discuss diet strategies, exercise recommendations, medication options and possible side effects. At each visit, we review recommended immunizations and preventive care recommendations for diabetics and stress that good diabetic control can prevent other problems. See below for this patient's data.   Commons side effects, risks, benefits, and alternatives for medications and treatment plan prescribed today were discussed, and the patient expressed understanding of the given instructions. Patient is instructed to call or message via MyChart if he/she has any questions or concerns regarding our treatment plan. No barriers to understanding were identified. We discussed Red Flag symptoms and signs in detail. Patient expressed understanding regarding what to do in case of urgent or emergency type symptoms.  Medication list was reconciled, printed and provided to the patient in AVS. Patient instructions and summary information was reviewed with the patient as documented in the AVS. This note was prepared with assistance of Dragon voice recognition software. Occasional wrong-word or sound-a-like substitutions may have occurred due to the inherent limitations of voice recognition software

## 2023-10-24 NOTE — Patient Instructions (Signed)
Please return in 3 months to recheck diabetes and blood pressure  If you have any questions or concerns, please don't hesitate to send me a message via MyChart or call the office at [phone]. Thank you for visiting with [place] today! It's our pleasure caring for you.

## 2023-10-25 DIAGNOSIS — I130 Hypertensive heart and chronic kidney disease with heart failure and stage 1 through stage 4 chronic kidney disease, or unspecified chronic kidney disease: Secondary | ICD-10-CM | POA: Diagnosis not present

## 2023-10-25 DIAGNOSIS — Z48815 Encounter for surgical aftercare following surgery on the digestive system: Secondary | ICD-10-CM | POA: Diagnosis not present

## 2023-11-22 ENCOUNTER — Other Ambulatory Visit: Payer: Self-pay | Admitting: Family Medicine

## 2023-12-03 ENCOUNTER — Encounter: Payer: Self-pay | Admitting: Family Medicine

## 2023-12-29 DIAGNOSIS — R778 Other specified abnormalities of plasma proteins: Secondary | ICD-10-CM | POA: Diagnosis not present

## 2023-12-29 DIAGNOSIS — E1122 Type 2 diabetes mellitus with diabetic chronic kidney disease: Secondary | ICD-10-CM | POA: Diagnosis not present

## 2023-12-29 DIAGNOSIS — I129 Hypertensive chronic kidney disease with stage 1 through stage 4 chronic kidney disease, or unspecified chronic kidney disease: Secondary | ICD-10-CM | POA: Diagnosis not present

## 2023-12-29 DIAGNOSIS — R809 Proteinuria, unspecified: Secondary | ICD-10-CM | POA: Diagnosis not present

## 2023-12-29 DIAGNOSIS — I480 Paroxysmal atrial fibrillation: Secondary | ICD-10-CM | POA: Diagnosis not present

## 2023-12-29 DIAGNOSIS — N1832 Chronic kidney disease, stage 3b: Secondary | ICD-10-CM | POA: Diagnosis not present

## 2023-12-29 DIAGNOSIS — I5032 Chronic diastolic (congestive) heart failure: Secondary | ICD-10-CM | POA: Diagnosis not present

## 2023-12-29 DIAGNOSIS — E785 Hyperlipidemia, unspecified: Secondary | ICD-10-CM | POA: Diagnosis not present

## 2024-01-17 DIAGNOSIS — N1832 Chronic kidney disease, stage 3b: Secondary | ICD-10-CM | POA: Diagnosis not present

## 2024-01-21 ENCOUNTER — Other Ambulatory Visit: Payer: Self-pay | Admitting: Family Medicine

## 2024-01-23 ENCOUNTER — Encounter: Payer: Self-pay | Admitting: Family Medicine

## 2024-01-23 ENCOUNTER — Ambulatory Visit: Payer: Self-pay | Admitting: Family Medicine

## 2024-01-23 ENCOUNTER — Ambulatory Visit: Admitting: Family Medicine

## 2024-01-23 VITALS — BP 120/70 | HR 88 | Temp 97.7°F | Ht 61.0 in | Wt 133.2 lb

## 2024-01-23 DIAGNOSIS — N1832 Chronic kidney disease, stage 3b: Secondary | ICD-10-CM

## 2024-01-23 DIAGNOSIS — I10 Essential (primary) hypertension: Secondary | ICD-10-CM | POA: Diagnosis not present

## 2024-01-23 DIAGNOSIS — E1169 Type 2 diabetes mellitus with other specified complication: Secondary | ICD-10-CM | POA: Diagnosis not present

## 2024-01-23 DIAGNOSIS — E782 Mixed hyperlipidemia: Secondary | ICD-10-CM | POA: Diagnosis not present

## 2024-01-23 DIAGNOSIS — G44209 Tension-type headache, unspecified, not intractable: Secondary | ICD-10-CM

## 2024-01-23 DIAGNOSIS — Z7984 Long term (current) use of oral hypoglycemic drugs: Secondary | ICD-10-CM | POA: Diagnosis not present

## 2024-01-23 DIAGNOSIS — E0590 Thyrotoxicosis, unspecified without thyrotoxic crisis or storm: Secondary | ICD-10-CM | POA: Diagnosis not present

## 2024-01-23 DIAGNOSIS — E1142 Type 2 diabetes mellitus with diabetic polyneuropathy: Secondary | ICD-10-CM

## 2024-01-23 LAB — LIPID PANEL
Cholesterol: 123 mg/dL (ref 0–200)
HDL: 70.5 mg/dL (ref >39.00)
LDL Cholesterol: 41 mg/dL (ref 0–99)
NonHDL: 52.1
Total CHOL/HDL Ratio: 2
Triglycerides: 58 mg/dL (ref 0.0–149.0)
VLDL: 11.6 mg/dL (ref 0.0–40.0)

## 2024-01-23 LAB — COMPREHENSIVE METABOLIC PANEL WITH GFR
ALT: 18 U/L (ref 0–35)
AST: 27 U/L (ref 0–37)
Albumin: 4.2 g/dL (ref 3.5–5.2)
Alkaline Phosphatase: 106 U/L (ref 39–117)
BUN: 21 mg/dL (ref 6–23)
CO2: 30 meq/L (ref 19–32)
Calcium: 10 mg/dL (ref 8.4–10.5)
Chloride: 106 meq/L (ref 96–112)
Creatinine, Ser: 1.25 mg/dL — ABNORMAL HIGH (ref 0.40–1.20)
GFR: 38.86 mL/min — ABNORMAL LOW (ref >60.00)
Glucose, Bld: 171 mg/dL — ABNORMAL HIGH (ref 70–99)
Potassium: 4.2 meq/L (ref 3.5–5.1)
Sodium: 142 meq/L (ref 135–145)
Total Bilirubin: 0.9 mg/dL (ref 0.2–1.2)
Total Protein: 8 g/dL (ref 6.0–8.3)

## 2024-01-23 LAB — CBC WITH DIFFERENTIAL/PLATELET
Basophils Absolute: 0 K/uL (ref 0.0–0.1)
Basophils Relative: 0.6 % (ref 0.0–3.0)
Eosinophils Absolute: 0.1 K/uL (ref 0.0–0.7)
Eosinophils Relative: 3.2 % (ref 0.0–5.0)
HCT: 42.1 % (ref 36.0–46.0)
Hemoglobin: 14 g/dL (ref 12.0–15.0)
Lymphocytes Relative: 25.5 % (ref 12.0–46.0)
Lymphs Abs: 1.1 K/uL (ref 0.7–4.0)
MCHC: 33.4 g/dL (ref 30.0–36.0)
MCV: 86.5 fl (ref 78.0–100.0)
Monocytes Absolute: 0.3 K/uL (ref 0.1–1.0)
Monocytes Relative: 8 % (ref 3.0–12.0)
Neutro Abs: 2.6 K/uL (ref 1.4–7.7)
Neutrophils Relative %: 62.7 % (ref 43.0–77.0)
Platelets: 107 K/uL — ABNORMAL LOW (ref 150.0–400.0)
RBC: 4.87 Mil/uL (ref 3.87–5.11)
RDW: 14.5 % (ref 11.5–15.5)
WBC: 4.2 K/uL (ref 4.0–10.5)

## 2024-01-23 LAB — T3, FREE: T3, Free: 3.9 pg/mL (ref 2.3–4.2)

## 2024-01-23 LAB — TSH: TSH: 0.72 u[IU]/mL (ref 0.35–5.50)

## 2024-01-23 LAB — HEMOGLOBIN A1C: Hgb A1c MFr Bld: 6.8 % — ABNORMAL HIGH (ref 4.6–6.5)

## 2024-01-23 LAB — T4, FREE: Free T4: 0.96 ng/dL (ref 0.60–1.60)

## 2024-01-23 NOTE — Progress Notes (Signed)
 See mychart note

## 2024-01-23 NOTE — Progress Notes (Signed)
 Subjective  CC:  Chief Complaint  Patient presents with   Diabetes   Hypertension    HPI: Nicole Jimenez is a 89 y.o. female who presents to the office today for follow up of diabetes and problems listed above in the chief complaint.  Discussed the use of AI scribe software for clinical note transcription with the patient, who gave verbal consent to proceed.  History of Present Illness Nicole Jimenez is an 89 year old female who presents with morning headaches and stress-related symptoms.  Cephalalgia - Tension headaches occur primarily in the morning upon waking - Pain is mostly on the right side, occasionally on the left - Relief achieved by applying pressure to her eye or other areas of her head - Headaches attributed to stress, including recent family events and planning a traditional Chinese bridal tea ceremony - Irregular sleep schedule, going to bed late and waking around 8:30 to 9:00 AM - Severe headaches relieved by two Tylenol   Hypertension - Blood pressure elevated today - Recent home and primary care office readings were normal - Currently taking diltiazem  and carvedilol  - No longer taking hydrochlorothiazide  - Experiencing stress related to organizing her home and preparing for a Christmas party, which may be affecting blood pressure  Diabetes mellitus - Managed with Jardiance  10 mg - Blood sugar levels generally well-controlled - Recent fasting readings slightly elevated, with one reading at 142 mg/dL - Due for an J8r test - no sxs  Chronic kidney disease, reviewed recent renal notes. Bp was controlled at that visit.  - Recently completed a 24-hour urine test, submitted last week - Awaiting results to check for proteinuria - Nephrologist indicated kidney function was stable as of last check in September  Subclinical hypothyroidism and hyperlipidemia - History of subclinical hypothyroidism and hyperlipidemia - Taking rosuvastatin  40 mg - No symptoms  of fatigue, palpitations, sweats, or tremors - Clinically well - non fasting today and due for recheck.  - energy level and weight are stable.  - no swelling    Wt Readings from Last 3 Encounters:  01/23/24 133 lb 3.2 oz (60.4 kg)  10/24/23 130 lb 9.6 oz (59.2 kg)  09/19/23 126 lb 3.2 oz (57.2 kg)    BP Readings from Last 3 Encounters:  01/23/24 120/70  10/24/23 136/65  09/19/23 118/73    Assessment  1. Type 2 diabetes mellitus with peripheral neuropathy (HCC)   2. Combined hyperlipidemia associated with type 2 diabetes mellitus (HCC)   3. Chronic kidney disease, stage 3b (HCC)   4. Essential hypertension   5. Subclinical hyperthyroidism   6. Tension headache      Plan  Assessment and Plan Assessment & Plan Tension-type headache Experiencing morning headaches, likely tension-type, associated with stress and irregular sleep patterns. Relief with Tylenol . - Continue Tylenol  as needed for headache relief. - reassured. No red flag sxs  Type 2 diabetes mellitus with diabetic polyneuropathy Diabetes is generally well-controlled with Jardiance . Recent home blood glucose readings slightly elevated, with a recent reading of 142 mg/dL. A1c pending for further assessment. - Ordered A1c test to assess current diabetes control. - Continue Jardiance  10 mg daily. - goal A1c < 8.0 given age  Chronic kidney disease, stage 3b Chronic kidney disease stage 3b. Recent nephrology notes indicate stable kidney function. Awaiting results of 24-hour urine protein test to assess for proteinuria. - Await results of 24-hour urine protein test. Continue jardiance   Essential hypertension Blood pressure readings have been variable, with recent high readings  in the office, possibly due to stress. Home readings have been better. Currently on diltiazem  and carvedilol . - Monitor blood pressure daily for one week and report readings. - Continue current antihypertensive regimen with diltiazem  and  carvedilol . - check renal function and lytes  Subclinical hypothyroidism Due for recheck. Clinically feeling well with no symptoms of overt hypothyroidism. - Ordered thyroid  function tests for re-evaluation. - clinically euthyroid    Follow up: 3 months Orders Placed This Encounter  Procedures   CBC with Differential/Platelet   Comprehensive metabolic panel with GFR   Lipid panel   Hemoglobin A1c   TSH   T3, free   T4, free   No orders of the defined types were placed in this encounter.     Immunization History  Administered Date(s) Administered   Fluad Quad(high Dose 65+) 11/20/2018, 11/28/2020, 10/23/2021   Fluad Trivalent(High Dose 65+) 11/01/2022   INFLUENZA, HIGH DOSE SEASONAL PF 12/02/2013, 01/20/2015, 12/28/2016, 10/26/2019, 10/24/2023   Influenza Split 01/24/2012   Influenza,inj,Quad PF,6+ Mos 11/21/2015, 11/18/2017   Influenza-Unspecified 01/24/2012, 01/05/2014, 01/05/2014, 01/20/2015   PFIZER Comirnaty(Gray Top)Covid-19 Tri-Sucrose Vaccine 05/20/2020   PFIZER(Purple Top)SARS-COV-2 Vaccination 03/01/2019, 03/22/2019, 11/16/2019   Pfizer Covid-19 Vaccine Bivalent Booster 42yrs & up 12/16/2020   Pfizer(Comirnaty)Fall Seasonal Vaccine 12 years and older 11/29/2021   Pneumococcal Conjugate-13 03/06/2015, 03/06/2015   Pneumococcal Polysaccharide-23 06/18/2016   Respiratory Syncytial Virus Vaccine,Recomb Aduvanted(Arexvy) 01/17/2023   Zoster Recombinant(Shingrix) 03/09/2018, 08/21/2018   Zoster, Live 10/09/2012    Diabetes Related Lab Review: Lab Results  Component Value Date   HGBA1C 6.8 (H) 01/23/2024   HGBA1C 6.3 (A) 10/24/2023   HGBA1C 6.5 (A) 07/19/2023    Lab Results  Component Value Date   MICROALBUR 29.0 (H) 04/21/2023   Lab Results  Component Value Date   CREATININE 1.25 (H) 01/23/2024   BUN 21 01/23/2024   NA 142 01/23/2024   K 4.2 01/23/2024   CL 106 01/23/2024   CO2 30 01/23/2024   Lab Results  Component Value Date   CHOL 123 01/23/2024    CHOL 136 11/01/2022   CHOL 176 10/23/2021   Lab Results  Component Value Date   HDL 70.50 01/23/2024   HDL 65.70 11/01/2022   HDL 71.50 10/23/2021   Lab Results  Component Value Date   LDLCALC 41 01/23/2024   LDLCALC 51 11/01/2022   LDLCALC 84 10/23/2021   Lab Results  Component Value Date   TRIG 58.0 01/23/2024   TRIG 38 09/07/2023   TRIG 97.0 11/01/2022   Lab Results  Component Value Date   CHOLHDL 2 01/23/2024   CHOLHDL 2 11/01/2022   CHOLHDL 2 10/23/2021   No results found for: LDLDIRECT The ASCVD Risk score (Arnett DK, et al., 2019) failed to calculate for the following reasons:   The 2019 ASCVD risk score is only valid for ages 12 to 82   * - Cholesterol units were assumed I have reviewed the PMH, Fam and Soc history. Patient Active Problem List   Diagnosis Date Noted Date Diagnosed   Chronic kidney disease, stage 3b (HCC) 11/02/2022     Priority: High    Had mild ckd, likely HTN/DM related. Then Had ATN 04/2022 w/ highest creatinine 4+; never returned completely back to baseline; GFR aroudn 30 is new baseline 10/2022 05/2023: renal consult due to proteinuria: can increase jardiance  to 25, no other changes needed. Low risk of progression but will follow. Dr. Prescilla    History of arterial embolism 08/21/2018     Priority: High  Managed by vascular. Feb 02, 2017    Anticoagulant long-term use 03/06/2018     Priority: High   Permanent atrial fibrillation (HCC) 01/23/2018     Priority: High   Chronic diastolic CHF (congestive heart failure) (HCC) 03/03/2017     Priority: High    Echo 11/2016, nl EF but LVH and diastolic dysfunction     Type 2 diabetes mellitus with peripheral neuropathy (HCC) 01/24/2017     Priority: High    Had been on metformin ; stopped in march 2024 during hospitalization for AKI on ACE.  H/o pancreatitis: avoiding glp-1 and cautious if sglt2-I started jardiance  10/24    Subclinical hyperthyroidism 01/21/2014     Priority: High     Managed by endocrine    Essential hypertension 02/23/2013     Priority: High    improved on recheck. Previously, lisinopril  and HCTZ stopped 04/2022 during admission for AKI/ATN. HCTZ has been restarted -BP improved with change from atenolol  to carvedilol  -if additional BP control needed at follow up, could change diltiazem  to amlodipine and monitor HR -stressed salt avoidance. She is working on activity     Combined hyperlipidemia associated with type 2 diabetes mellitus (HCC) 02/23/2013     Priority: High   Major depression, recurrent, chronic 07/18/2012     Priority: High   Thrombocytopenia 11/02/2022     Priority: Medium     First noted March 2024 during hospitalization for AKI and pancreatitis.  Improved posthospitalization.  September 2024, platelets 121.  Unclear cause, needs workup if persist.    Gout 11/01/2022     Priority: Medium    DJD of right shoulder 01/24/2017     Priority: Medium    Bereavement due to life event 03/05/2016     Priority: Medium     Her husband of 56 years died in 02-03-16    Osteoporosis of femur without pathological fracture 08/23/2015     Priority: Medium     DEXA 11/2022: lowest T -=-2.3 FRAX score: 10 year major osteoporotic risk: 16%. 10 year hip fracture risk: 4.9%. The thresholds for treatment are 20% and 3%, respectively. She will consider fosamax  but defer decision until 02-03-2024.  DEXA 11/2020: lowest T =-2.3, left femoral neck, elevated FRAX score Major Osteoporotic Fracture: 29.4% Hip Fracture:                19.1% Recommended fosamax  but pt never started.   dexa 02/03/16: T = -1.0 at femur, elevated FRAX score.  Repeat 2018/02/02 Osteopenia with elevated hip frac score of 10%:  T lowest = - 1.8 Major Osteoporotic Fracture: 17.1% Hip Fracture:                10.4% Population:                  [place]  ([place]) Risk Factors:                Family Hist. (Parent hip fracture) Offer medications due to elevated hip fracture risk score.     Idiopathic  scoliosis of thoracic spine 07/09/2014     Priority: Medium    Sjogren's syndrome 08/04/2012     Priority: Medium    DDD (degenerative disc disease), lumbosacral 07/15/2012     Priority: Medium    Lumbosacral spondylosis 07/15/2012     Priority: Medium    Osteoarthritis of knee 07/15/2012     Priority: Medium    Bilateral edema of lower extremity 01/26/2017     Priority: Low  Bilateral hip bursitis 07/17/2015     Priority: Low   Age-related nuclear cataract of both eyes 03/11/2015     Priority: Low   Insomnia 09/04/2013     Priority: Low   Allergic rhinitis 07/15/2012     Priority: Low   Gastritis without bleeding 09/09/2023    Obstructive jaundice (HCC) 09/09/2023    Abnormal LFTs 09/09/2023    Gallstone pancreatitis 09/09/2023    Acute pancreatitis 09/07/2023     Social History: Patient  reports that she has never smoked. She has never used smokeless tobacco. She reports that she does not drink alcohol and does not use drugs.  Review of Systems: Ophthalmic: negative for eye pain, loss of vision or double vision Cardiovascular: negative for chest pain Respiratory: negative for SOB or persistent cough Gastrointestinal: negative for abdominal pain Genitourinary: negative for dysuria or gross hematuria MSK: negative for foot lesions Neurologic: negative for weakness or gait disturbance  Objective  Vitals: BP 120/70 Comment: by last home reading  Pulse 88   Temp 97.7 F (36.5 C)   Ht 5' 1 (1.549 m)   Wt 133 lb 3.2 oz (60.4 kg)   SpO2 94%   BMI 25.17 kg/m  General: well appearing, no acute distress , appears well Psych:  Alert and oriented, normal mood and affect HEENT:  Normocephalic, atraumatic, moist mucous membranes, supple neck  Cardiovascular:  Nl S1 and S2, RRR without murmur, gallop or rub. no edema Respiratory:  Good breath sounds bilaterally, CTAB with normal effort, no rales  Diabetic education: ongoing education regarding chronic disease management for  diabetes was given today. We continue to reinforce the ABC's of diabetic management: A1c (<7 or 8 dependent upon patient), tight blood pressure control, and cholesterol management with goal LDL < 100 minimally. We discuss diet strategies, exercise recommendations, medication options and possible side effects. At each visit, we review recommended immunizations and preventive care recommendations for diabetics and stress that good diabetic control can prevent other problems. See below for this patient's data. Commons side effects, risks, benefits, and alternatives for medications and treatment plan prescribed today were discussed, and the patient expressed understanding of the given instructions. Patient is instructed to call or message via MyChart if he/she has any questions or concerns regarding our treatment plan. No barriers to understanding were identified. We discussed Red Flag symptoms and signs in detail. Patient expressed understanding regarding what to do in case of urgent or emergency type symptoms.  Medication list was reconciled, printed and provided to the patient in AVS. Patient instructions and summary information was reviewed with the patient as documented in the AVS. This note was prepared with assistance of Dragon voice recognition software. Occasional wrong-word or sound-a-like substitutions may have occurred due to the inherent limitations of voice recognition software

## 2024-01-25 NOTE — Telephone Encounter (Signed)
 Please read patient message. Tks

## 2024-02-16 ENCOUNTER — Telehealth: Payer: Self-pay

## 2024-02-16 NOTE — Telephone Encounter (Signed)
 Copied from CRM [id number]. Topic: Clinical - Medical Advice >> Feb 16, 2024 11:23 AM Herma G wrote: Reason for CRM: Pt requested a call back at [phone] to discuss the possibility of getting in for an appt with Ms. Jodie due to experiencing flu like symptoms. I offered next available on Jan 22nd.

## 2024-03-10 ENCOUNTER — Other Ambulatory Visit (HOSPITAL_BASED_OUTPATIENT_CLINIC_OR_DEPARTMENT_OTHER): Payer: Self-pay | Admitting: Family

## 2024-03-10 DIAGNOSIS — I4821 Permanent atrial fibrillation: Secondary | ICD-10-CM

## 2024-03-10 DIAGNOSIS — I10 Essential (primary) hypertension: Secondary | ICD-10-CM

## 2024-03-13 ENCOUNTER — Other Ambulatory Visit: Payer: Self-pay | Admitting: Family

## 2024-03-16 ENCOUNTER — Encounter: Payer: Self-pay | Admitting: Family Medicine

## 2024-03-16 ENCOUNTER — Other Ambulatory Visit: Payer: Self-pay

## 2024-03-16 MED ORDER — VORTIOXETINE HBR 10 MG PO TABS: 10.0000 mg | ORAL_TABLET | Freq: Every day | ORAL | 3 refills | Status: AC

## 2024-04-24 ENCOUNTER — Ambulatory Visit: Admitting: Family Medicine
